# Patient Record
Sex: Male | Born: 1935 | Race: White | Hispanic: No | Marital: Married | Smoking: Never smoker
Health system: Southern US, Community
[De-identification: ages and names within clinical notes are randomized; demographics above are authoritative.]

## PROBLEM LIST (undated history)

## (undated) DIAGNOSIS — N200 Calculus of kidney: Secondary | ICD-10-CM

## (undated) DIAGNOSIS — E785 Hyperlipidemia, unspecified: Secondary | ICD-10-CM

## (undated) DIAGNOSIS — C801 Malignant (primary) neoplasm, unspecified: Secondary | ICD-10-CM

## (undated) DIAGNOSIS — S62102A Fracture of unspecified carpal bone, left wrist, initial encounter for closed fracture: Secondary | ICD-10-CM

## (undated) DIAGNOSIS — H3530 Unspecified macular degeneration: Secondary | ICD-10-CM

## (undated) DIAGNOSIS — N429 Disorder of prostate, unspecified: Secondary | ICD-10-CM

## (undated) DIAGNOSIS — M1990 Unspecified osteoarthritis, unspecified site: Secondary | ICD-10-CM

## (undated) DIAGNOSIS — C61 Malignant neoplasm of prostate: Secondary | ICD-10-CM

## (undated) DIAGNOSIS — I10 Essential (primary) hypertension: Secondary | ICD-10-CM

## (undated) HISTORY — DX: Essential (primary) hypertension: I10

## (undated) HISTORY — DX: Malignant neoplasm of prostate: C61

## (undated) HISTORY — DX: Hyperlipidemia, unspecified: E78.5

## (undated) HISTORY — DX: Unspecified osteoarthritis, unspecified site: M19.90

## (undated) HISTORY — DX: Calculus of kidney: N20.0

## (undated) HISTORY — PX: TOTAL KNEE ARTHROPLASTY: SHX125

---

## 2001-07-16 DIAGNOSIS — C61 Malignant neoplasm of prostate: Secondary | ICD-10-CM

## 2001-07-16 HISTORY — DX: Malignant neoplasm of prostate: C61

## 2006-04-23 ENCOUNTER — Other Ambulatory Visit: Payer: Self-pay

## 2006-04-23 ENCOUNTER — Ambulatory Visit: Payer: Self-pay | Admitting: Urology

## 2006-04-29 ENCOUNTER — Ambulatory Visit: Payer: Self-pay | Admitting: Urology

## 2006-09-18 ENCOUNTER — Ambulatory Visit: Payer: Self-pay | Admitting: Urology

## 2006-12-05 ENCOUNTER — Ambulatory Visit: Payer: Self-pay | Admitting: Gastroenterology

## 2008-03-02 ENCOUNTER — Ambulatory Visit: Payer: Self-pay | Admitting: Internal Medicine

## 2011-02-12 ENCOUNTER — Ambulatory Visit: Payer: Self-pay | Admitting: General Practice

## 2011-02-28 ENCOUNTER — Inpatient Hospital Stay: Payer: Self-pay | Admitting: General Practice

## 2011-03-20 ENCOUNTER — Encounter: Payer: Self-pay | Admitting: General Practice

## 2011-04-16 ENCOUNTER — Encounter: Payer: Self-pay | Admitting: General Practice

## 2011-04-25 ENCOUNTER — Ambulatory Visit: Payer: Self-pay | Admitting: Internal Medicine

## 2012-04-16 DIAGNOSIS — C61 Malignant neoplasm of prostate: Secondary | ICD-10-CM | POA: Insufficient documentation

## 2012-04-16 DIAGNOSIS — N281 Cyst of kidney, acquired: Secondary | ICD-10-CM | POA: Insufficient documentation

## 2012-04-16 DIAGNOSIS — N529 Male erectile dysfunction, unspecified: Secondary | ICD-10-CM | POA: Insufficient documentation

## 2013-04-15 DIAGNOSIS — N320 Bladder-neck obstruction: Secondary | ICD-10-CM | POA: Insufficient documentation

## 2014-03-18 ENCOUNTER — Ambulatory Visit: Payer: Self-pay | Admitting: Internal Medicine

## 2015-01-24 DIAGNOSIS — C4492 Squamous cell carcinoma of skin, unspecified: Secondary | ICD-10-CM | POA: Insufficient documentation

## 2015-07-24 ENCOUNTER — Encounter: Payer: Self-pay | Admitting: Emergency Medicine

## 2015-07-24 ENCOUNTER — Emergency Department
Admission: EM | Admit: 2015-07-24 | Discharge: 2015-07-24 | Disposition: A | Discharge status: Home / Self Care | Payer: BC Managed Care – PPO | Attending: Emergency Medicine | Admitting: Emergency Medicine

## 2015-07-24 ENCOUNTER — Emergency Department: Payer: BC Managed Care – PPO

## 2015-07-24 DIAGNOSIS — S5292XA Unspecified fracture of left forearm, initial encounter for closed fracture: Secondary | ICD-10-CM

## 2015-07-24 DIAGNOSIS — Y990 Civilian activity done for income or pay: Secondary | ICD-10-CM | POA: Diagnosis not present

## 2015-07-24 DIAGNOSIS — S52592A Other fractures of lower end of left radius, initial encounter for closed fracture: Secondary | ICD-10-CM | POA: Diagnosis not present

## 2015-07-24 DIAGNOSIS — Y9289 Other specified places as the place of occurrence of the external cause: Secondary | ICD-10-CM | POA: Diagnosis not present

## 2015-07-24 DIAGNOSIS — S61412A Laceration without foreign body of left hand, initial encounter: Secondary | ICD-10-CM | POA: Diagnosis not present

## 2015-07-24 DIAGNOSIS — Y9389 Activity, other specified: Secondary | ICD-10-CM | POA: Insufficient documentation

## 2015-07-24 DIAGNOSIS — W000XXA Fall on same level due to ice and snow, initial encounter: Secondary | ICD-10-CM | POA: Diagnosis not present

## 2015-07-24 DIAGNOSIS — S6992XA Unspecified injury of left wrist, hand and finger(s), initial encounter: Secondary | ICD-10-CM | POA: Diagnosis present

## 2015-07-24 DIAGNOSIS — S52502A Unspecified fracture of the lower end of left radius, initial encounter for closed fracture: Secondary | ICD-10-CM | POA: Diagnosis not present

## 2015-07-24 MED ORDER — HYDROCODONE-ACETAMINOPHEN 5-325 MG PO TABS
2.0000 | ORAL_TABLET | Freq: Once | ORAL | Status: AC
  Administered 2015-07-24: 2 via ORAL
  Filled 2015-07-24: qty 2

## 2015-07-24 MED ORDER — PROMETHAZINE HCL 25 MG PO TABS: 25.0000 mg | ORAL_TABLET | Freq: Four times a day (QID) | ORAL | Status: DC | PRN

## 2015-07-24 MED ORDER — BACITRACIN ZINC 500 UNIT/GM EX OINT
TOPICAL_OINTMENT | CUTANEOUS | Status: AC
  Administered 2015-07-24: 11:00:00
  Filled 2015-07-24: qty 0.9

## 2015-07-24 MED ORDER — HYDROCODONE-ACETAMINOPHEN 5-325 MG PO TABS: 1.0000 | ORAL_TABLET | ORAL | Status: DC | PRN

## 2015-07-24 NOTE — ED Provider Notes (Signed)
[facility] Emergency Department Provider Note  ____________________________________________  Time seen: Approximately 10:24 AM  I have reviewed the triage vital signs and the nursing notes.   HISTORY  Chief Complaint Wrist Pain   HPI Todd Trujillo. is a 82 y.o. male presents with complaints of left wrist pain status post fall prior to arrival. Also complains of having a skin tear to his left palm.Patient states he was working outside in the snow when he slipped and fell. Complains of pain, but minimal.    History reviewed. No pertinent past medical history.  There are no active problems to display for this patient.   Past Surgical History  Procedure Laterality Date  . Total knee arthroplasty      Current Outpatient Rx  Name  Route  Sig  Dispense  Refill  . HYDROcodone-acetaminophen (NORCO) 5-325 MG tablet   Oral   Take 1-2 tablets by mouth every 4 (four) hours as needed for moderate pain.   20 tablet   0   . promethazine (PHENERGAN) 25 MG tablet   Oral   Take 1 tablet (25 mg total) by mouth every 6 (six) hours as needed for nausea or vomiting.   12 tablet   0     Allergies Codeine  History reviewed. No pertinent family history.  Social History Social History  Substance Use Topics  . Smoking status: Never Smoker   . Smokeless tobacco: None  . Alcohol Use: None    Review of Systems }Constitutional: No fever/chills Eyes: No visual changes. ENT: No sore throat. Cardiovascular: Denies chest pain. Respiratory: Denies shortness of breath. Gastrointestinal: No abdominal pain.  No nausea, no vomiting.  No diarrhea.  No constipation. Genitourinary: Negative for dysuria. Musculoskeletal: Positive for left wrist pain Skin: Negative for rash. Neurological: Negative for headaches, focal weakness or numbness.   10-point ROS otherwise negative.  ____________________________________________   PHYSICAL EXAM:  VITAL  SIGNS: ED Triage Vitals  Enc Vitals Group     BP 07/24/15 1009 155/81 mmHg     Pulse Rate 07/24/15 1009 82     Resp 07/24/15 1009 18     Temp 07/24/15 1009 97.9 F (36.6 C)     Temp Source 07/24/15 1009 Oral     SpO2 07/24/15 1009 95 %     Weight 07/24/15 1009 172 lb (78.019 kg)     Height 07/24/15 1009 5\' 11"  (1.803 m)     Head Cir --      Peak Flow --      Pain Score 07/24/15 1005 4     Pain Loc --      Pain Edu? --      Excl. in [place]? --     Constitutional: Alert and oriented. Well appearing and in no acute distress.   Cardiovascular: Normal rate, regular rhythm. Grossly normal heart sounds.  Good peripheral circulation. Respiratory: Normal respiratory effort.  No retractions. Lungs CTAB. Musculoskeletal: No lower extremity tenderness nor edema.  No joint effusions. Neurovascularly intact left wrist with full range of motion however limited by pain only. No ecchymosis or edema. Neurologic:  Normal speech and language. No gross focal neurologic deficits are appreciated. No gait instability. Skin:  One centimeters nummular skin tear noted to the palmar aspect of his left hand. Psychiatric: Mood and affect are normal. Speech and behavior are normal.  ____________________________________________   LABS (all labs ordered are listed, but only abnormal results are displayed)  Labs Reviewed - No data to display  ____________________________________________  RADIOLOGY  There is a nondisplaced fracture involving the distal radius. No significant angulation.  IMPRESSION: 1. Nondisplaced distal radius fracture. ____________________________________________   PROCEDURES  Procedure(s) performed: None  Critical Care performed: No  ____________________________________________   INITIAL IMPRESSION / ASSESSMENT AND PLAN / ED COURSE  Pertinent labs & imaging results that were available during my care of the patient were reviewed by me and considered in my medical decision making  (see chart for details).  Acute nondisplaced distal radius fracture. Vicodin 5/325 #2 given while in the ED. Palmar skin tear cleaned and Steri-Strips placed. ____________________________________________   FINAL CLINICAL IMPRESSION(S) / ED DIAGNOSES  Final diagnoses:  Radial fracture, left, closed, initial encounter      Arlyss Repress, PA-C 07/24/15 1105  Eula Listen, MD 07/24/15 1502

## 2015-07-24 NOTE — ED Notes (Signed)
Reports slipped on ice, pain to left wrist.  Skin tear to left palm

## 2015-07-24 NOTE — ED Notes (Signed)
NAD noted at time of D/C. Pt refused wheelchair to the lobby at this time. Pt denies comments/concerns at this time.  

## 2015-07-24 NOTE — Discharge Instructions (Signed)
Forearm Fracture A forearm fracture is a break in one or both of the bones of your arm that are between the elbow and the wrist. Your forearm is made up of two bones:  Radius. This is the bone on the inside of your arm near your thumb.  Ulna. This is the bone on the outside of your arm near your little finger. Middle forearm fractures usually break both the radius and the ulna. Most forearm fractures that involve both the ulna and radius will require surgery. CAUSES Common causes of this type of fracture include:  Falling on an outstretched arm.  Accidents, such as a car or bike accident.  A hard, direct hit to the middle part of your arm. RISK FACTORS You may be at higher risk for this type of fracture if:  You play contact sports.  You have a condition that causes your bones to be weak or thin (osteoporosis). SIGNS AND SYMPTOMS A forearm fracture causes pain immediately after the injury. Other signs and symptoms include:  An abnormal bend or bump in your arm (deformity).  Swelling.  Numbness or tingling.  Tenderness.  Inability to turn your hand from side to side (rotate).  Bruising. DIAGNOSIS Your health care provider may diagnose a forearm fracture based on:  Your symptoms.  Your medical history, including any recent injury.  A physical exam. Your health care provider will look for any deformity and feel for tenderness over the break. Your health care provider will also check whether the bones are out of place.  An X-ray exam to confirm the diagnosis and learn more about the type of fracture. TREATMENT The goals of treatment are to get the bone or bones in proper position for healing and to keep the bones from moving so they will heal over time. Your treatment will depend on many factors, especially the type of fracture that you have.  If the fractured bone or bones:  Are in the correct position (nondisplaced), you may only need to wear a cast or a  splint.  Have a slightly displaced fracture, you may need to have the bones moved back into place manually (closed reduction) before the splint or cast is put on.  You may have a temporary splint before you have a cast. The splint allows room for some swelling. After a few days, a cast can replace the splint.  You may have to wear the cast for 6-8 weeks or as directed by your health care provider.  The cast may be changed after about 3 weeks or as directed by your health care provider.  After your cast is removed, you may need physical therapy to regain full movement in your wrist or elbow.  You may need emergency surgery if you have:  A fractured bone or bones that are out of position (displaced).  A fracture with multiple fragments (comminuted fracture).  A fracture that breaks the skin (open fracture). This type of fracture may require surgical wires, plates, or screws to hold the bone or bones in place.  You may have X-rays every couple of weeks to check on your healing. HOME CARE INSTRUCTIONS If You Have a Cast:  Do not stick anything inside the cast to scratch your skin. Doing that increases your risk of infection.  Check the skin around the cast every day. Report any concerns to your health care provider. You may put lotion on dry skin around the edges of the cast. Do not apply lotion to the skin  underneath the cast. If You Have a Splint:  Wear it as directed by your health care provider. Remove it only as directed by your health care provider.  Loosen the splint if your fingers become numb and tingle, or if they turn cold and blue. Bathing  Cover the cast or splint with a watertight plastic bag to protect it from water while you bathe or shower. Do not let the cast or splint get wet. Managing Pain, Stiffness, and Swelling  If directed, apply ice to the injured area:  Put ice in a plastic bag.  Place a towel between your skin and the bag.  Leave the ice on for 20  minutes, 2-3 times a day.  Move your fingers often to avoid stiffness and to lessen swelling.  Raise the injured area above the level of your heart while you are sitting or lying down. Driving  Do not drive or operate heavy machinery while taking pain medicine.  Do not drive while wearing a cast or splint on a hand that you use for driving. Activity  Return to your normal activities as directed by your health care provider. Ask your health care provider what activities are safe for you.  Perform range-of-motion exercises only as directed by your health care provider. Safety  Do not use your injured limb to support your body weight until your health care provider says that you can. General Instructions  Do not put pressure on any part of the cast or splint until it is fully hardened. This may take several hours.  Keep the cast or splint clean and dry.  Do not use any tobacco products, including cigarettes, chewing tobacco, or electronic cigarettes. Tobacco can delay bone healing. If you need help quitting, ask your health care provider.  Take medicines only as directed by your health care provider.  Keep all follow-up visits as directed by your health care provider. This is important. SEEK MEDICAL CARE IF:  Your pain medicine is not helping.  Your cast or splint becomes wet or damaged or suddenly feels too tight.  Your cast becomes loose.  You have more severe pain or swelling than you did before the cast.  You have severe pain when you stretch your fingers.  You continue to have pain or stiffness in your elbow or your wrist after your cast is removed. SEEK IMMEDIATE MEDICAL CARE IF:  You cannot move your fingers.  You lose feeling in your fingers or your hand.  Your hand or your fingers turn cold and pale or blue.  You notice a bad smell coming from your cast.  You have drainage from underneath your cast.  You have new stains from blood or drainage that is coming  through your cast.   This information is not intended to replace advice given to you by your health care provider. Make sure you discuss any questions you have with your health care provider.   Document Released: 06/29/2000 Document Revised: 07/23/2014 Document Reviewed: 02/15/2014 Elsevier Interactive Patient Education 2016 [place] or Splint Care Casts and splints support injured limbs and keep bones from moving while they heal. It is important to care for your cast or splint at home.  HOME CARE INSTRUCTIONS  Keep the cast or splint uncovered during the drying period. It can take 24 to 48 hours to dry if it is made of plaster. A fiberglass cast will dry in less than 1 hour.  Do not rest the cast on anything harder than  a pillow for the first 24 hours.  Do not put weight on your injured limb or apply pressure to the cast until your health care provider gives you permission.  Keep the cast or splint dry. Wet casts or splints can lose their shape and may not support the limb as well. A wet cast that has lost its shape can also create harmful pressure on your skin when it dries. Also, wet skin can become infected.  Cover the cast or splint with a plastic bag when bathing or when out in the rain or snow. If the cast is on the trunk of the body, take sponge baths until the cast is removed.  If your cast does become wet, dry it with a towel or a blow dryer on the cool setting only.  Keep your cast or splint clean. Soiled casts may be wiped with a moistened cloth.  Do not place any hard or soft foreign objects under your cast or splint, such as cotton, toilet paper, lotion, or powder.  Do not try to scratch the skin under the cast with any object. The object could get stuck inside the cast. Also, scratching could lead to an infection. If itching is a problem, use a blow dryer on a cool setting to relieve discomfort.  Do not trim or cut your cast or remove padding from inside of  it.  Exercise all joints next to the injury that are not immobilized by the cast or splint. For example, if you have a long leg cast, exercise the hip joint and toes. If you have an arm cast or splint, exercise the shoulder, elbow, thumb, and fingers.  Elevate your injured arm or leg on 1 or 2 pillows for the first 1 to 3 days to decrease swelling and pain.It is best if you can comfortably elevate your cast so it is higher than your heart. SEEK MEDICAL CARE IF:   Your cast or splint cracks.  Your cast or splint is too tight or too loose.  You have unbearable itching inside the cast.  Your cast becomes wet or develops a soft spot or area.  You have a bad smell coming from inside your cast.  You get an object stuck under your cast.  Your skin around the cast becomes red or raw.  You have new pain or worsening pain after the cast has been applied. SEEK IMMEDIATE MEDICAL CARE IF:   You have fluid leaking through the cast.  You are unable to move your fingers or toes.  You have discolored (blue or white), cool, painful, or very swollen fingers or toes beyond the cast.  You have tingling or numbness around the injured area.  You have severe pain or pressure under the cast.  You have any difficulty with your breathing or have shortness of breath.  You have chest pain.   This information is not intended to replace advice given to you by your health care provider. Make sure you discuss any questions you have with your health care provider.   Document Released: 06/29/2000 Document Revised: 04/22/2013 Document Reviewed: 01/08/2013 Elsevier Interactive Patient Education [employer].

## 2015-07-25 DIAGNOSIS — S52532A Colles' fracture of left radius, initial encounter for closed fracture: Secondary | ICD-10-CM | POA: Diagnosis not present

## 2015-08-03 DIAGNOSIS — S52532D Colles' fracture of left radius, subsequent encounter for closed fracture with routine healing: Secondary | ICD-10-CM | POA: Diagnosis not present

## 2015-08-10 DIAGNOSIS — H353122 Nonexudative age-related macular degeneration, left eye, intermediate dry stage: Secondary | ICD-10-CM | POA: Diagnosis not present

## 2015-08-24 DIAGNOSIS — S52532D Colles' fracture of left radius, subsequent encounter for closed fracture with routine healing: Secondary | ICD-10-CM | POA: Diagnosis not present

## 2015-09-06 DIAGNOSIS — Z862 Personal history of diseases of the blood and blood-forming organs and certain disorders involving the immune mechanism: Secondary | ICD-10-CM | POA: Diagnosis not present

## 2015-09-06 DIAGNOSIS — E7800 Pure hypercholesterolemia, unspecified: Secondary | ICD-10-CM | POA: Diagnosis not present

## 2015-09-06 DIAGNOSIS — Z79899 Other long term (current) drug therapy: Secondary | ICD-10-CM | POA: Diagnosis not present

## 2015-09-06 DIAGNOSIS — Z125 Encounter for screening for malignant neoplasm of prostate: Secondary | ICD-10-CM | POA: Diagnosis not present

## 2015-09-07 DIAGNOSIS — C4442 Squamous cell carcinoma of skin of scalp and neck: Secondary | ICD-10-CM | POA: Diagnosis not present

## 2015-09-07 DIAGNOSIS — Z85828 Personal history of other malignant neoplasm of skin: Secondary | ICD-10-CM | POA: Diagnosis not present

## 2015-09-07 DIAGNOSIS — L570 Actinic keratosis: Secondary | ICD-10-CM | POA: Diagnosis not present

## 2015-09-07 DIAGNOSIS — D485 Neoplasm of uncertain behavior of skin: Secondary | ICD-10-CM | POA: Diagnosis not present

## 2015-09-07 DIAGNOSIS — S52562D Barton's fracture of left radius, subsequent encounter for closed fracture with routine healing: Secondary | ICD-10-CM | POA: Diagnosis not present

## 2015-09-21 DIAGNOSIS — H353211 Exudative age-related macular degeneration, right eye, with active choroidal neovascularization: Secondary | ICD-10-CM | POA: Diagnosis not present

## 2015-09-22 DIAGNOSIS — C4442 Squamous cell carcinoma of skin of scalp and neck: Secondary | ICD-10-CM | POA: Diagnosis not present

## 2015-10-12 DIAGNOSIS — Z862 Personal history of diseases of the blood and blood-forming organs and certain disorders involving the immune mechanism: Secondary | ICD-10-CM | POA: Diagnosis not present

## 2015-10-12 DIAGNOSIS — I739 Peripheral vascular disease, unspecified: Secondary | ICD-10-CM | POA: Insufficient documentation

## 2015-10-12 DIAGNOSIS — N400 Enlarged prostate without lower urinary tract symptoms: Secondary | ICD-10-CM | POA: Diagnosis not present

## 2015-10-12 DIAGNOSIS — I10 Essential (primary) hypertension: Secondary | ICD-10-CM | POA: Diagnosis not present

## 2015-10-12 DIAGNOSIS — Z1211 Encounter for screening for malignant neoplasm of colon: Secondary | ICD-10-CM | POA: Diagnosis not present

## 2015-10-12 DIAGNOSIS — Z79899 Other long term (current) drug therapy: Secondary | ICD-10-CM | POA: Diagnosis not present

## 2015-10-12 DIAGNOSIS — M25532 Pain in left wrist: Secondary | ICD-10-CM | POA: Diagnosis not present

## 2015-10-12 DIAGNOSIS — E7800 Pure hypercholesterolemia, unspecified: Secondary | ICD-10-CM | POA: Diagnosis not present

## 2015-10-12 DIAGNOSIS — I6523 Occlusion and stenosis of bilateral carotid arteries: Secondary | ICD-10-CM | POA: Diagnosis not present

## 2015-10-12 DIAGNOSIS — Z0000 Encounter for general adult medical examination without abnormal findings: Secondary | ICD-10-CM | POA: Diagnosis not present

## 2015-10-17 DIAGNOSIS — J3489 Other specified disorders of nose and nasal sinuses: Secondary | ICD-10-CM | POA: Diagnosis not present

## 2015-10-17 DIAGNOSIS — H6123 Impacted cerumen, bilateral: Secondary | ICD-10-CM | POA: Diagnosis not present

## 2015-10-19 DIAGNOSIS — Z1211 Encounter for screening for malignant neoplasm of colon: Secondary | ICD-10-CM | POA: Diagnosis not present

## 2015-11-02 DIAGNOSIS — I6523 Occlusion and stenosis of bilateral carotid arteries: Secondary | ICD-10-CM | POA: Diagnosis not present

## 2015-11-02 DIAGNOSIS — S52562D Barton's fracture of left radius, subsequent encounter for closed fracture with routine healing: Secondary | ICD-10-CM | POA: Diagnosis not present

## 2015-11-02 DIAGNOSIS — H353211 Exudative age-related macular degeneration, right eye, with active choroidal neovascularization: Secondary | ICD-10-CM | POA: Diagnosis not present

## 2015-11-09 ENCOUNTER — Ambulatory Visit: Payer: BC Managed Care – PPO | Attending: Orthopedic Surgery | Admitting: Occupational Therapy

## 2015-11-09 DIAGNOSIS — M25642 Stiffness of left hand, not elsewhere classified: Secondary | ICD-10-CM | POA: Diagnosis not present

## 2015-11-09 DIAGNOSIS — M25532 Pain in left wrist: Secondary | ICD-10-CM | POA: Insufficient documentation

## 2015-11-09 DIAGNOSIS — M25632 Stiffness of left wrist, not elsewhere classified: Secondary | ICD-10-CM | POA: Insufficient documentation

## 2015-11-09 DIAGNOSIS — M6281 Muscle weakness (generalized): Secondary | ICD-10-CM | POA: Insufficient documentation

## 2015-11-09 NOTE — Therapy (Signed)
[facility] [address] [place], [address] Phone: [phone]   Fax:  [phone]  Occupational Therapy Treatment  Patient Details  Name: Todd Trujillo. MRN: [id number] Date of Birth: [birth year] Referring Provider: Carlynn Spry  Encounter Date: 11/09/2015      OT End of Session - 11/09/15 0958    Visit Number 1   Number of Visits 8   Date for OT Re-Evaluation 12/07/15   OT Start Time 0850   OT Stop Time 0955   OT Time Calculation (min) 65 min   Activity Tolerance Patient tolerated treatment well   Behavior During Therapy [facility] for tasks assessed/performed      No past medical history on file.  Past Surgical History  Procedure Laterality Date  . Total knee arthroplasty      There were no vitals filed for this visit.          [facility] OT Assessment - 11/09/15 0001    Assessment   Diagnosis L radius fx   Referring Provider Carlynn Spry   Onset Date 07/23/15   Precautions   Required Braces or Orthoses Other Brace/Splint   Other Brace/Splint Wrap around wrist at times - recommmend to use during heavy act   Balance Screen   Has the patient fallen in the past 6 months Yes   How many times? 1   Has the patient had a decrease in activity level because of a fear of falling?  No   Is the patient reluctant to leave their home because of a fear of falling?  No   Home  Environment   Lives With Spouse   Prior Function   Vocation Retired   Leisure Work still at [employer] - every day - use to play golf , fish at [employer] , [place] prior to fall - help some with house work    AROM   Right Forearm Pronation 90 Degrees   Right Forearm Supination 90 Degrees   Left Forearm Pronation 75 Degrees   Left Forearm Supination 80 Degrees   Right Wrist Extension 65 Degrees   Right Wrist Flexion 65 Degrees   Right Wrist Radial Deviation 25 Degrees   Right Wrist Ulnar Deviation 30 Degrees    Left Wrist Extension 38 Degrees   Left Wrist Flexion 45 Degrees   Left Wrist Radial Deviation 5 Degrees   Left Wrist Ulnar Deviation 22 Degrees   Strength   Right Hand Grip (lbs) 49   Right Hand Lateral Pinch 21 lbs   Right Hand 3 Point Pinch 15 lbs   Left Hand Grip (lbs) 20   Left Hand Lateral Pinch 13 lbs   Left Hand 3 Point Pinch 10 lbs   Left Hand AROM   L Index  MCP 0-90 80 Degrees   L Index PIP 0-100 706 Degrees   L Long  MCP 0-90 85 Degrees   L Long PIP 0-100 60 Degrees   L Ring  MCP 0-90 80 Degrees   L Ring PIP 0-100 70 Degrees   L Little  MCP 0-90 85 Degrees   L Little PIP 0-100 65 Degrees         Fluido therapy done - with AROM for digits and wrist in all planes - prior to review of HEP - PIP's flexion improved greatly                  OT Education - 11/09/15 [id number]  Education provided Yes   Education Details HEP   Person(s) Educated Patient   Methods Explanation;Demonstration;Tactile cues;Verbal cues   Comprehension Verbal cues required;Returned demonstration;Verbalized understanding          OT Short Term Goals - 11/09/15 1002    OT SHORT TERM GOAL #1   Title Pain on PRWHE improve with 10 points at least    Baseline pain on PRWHE at eval 25/50    Time 3   Period Weeks   Status New   OT SHORT TERM GOAL #2   Title AROM in digits improve to touching palm to grast cylinder objects during ADL's    Baseline MC's 80-85 and PIP 's 60-70's    Time 3   Period Weeks   Status New           OT Long Term Goals - 11/09/15 1003    OT LONG TERM GOAL #1   Title Wrist AROM improve in all planes except UD improve with 10 degrees to turn do hair, bath , use hand with less pain    Baseline see flowsheet    Time 4   Period Weeks   Status New   OT LONG TERM GOAL #2   Title Grip strength improve with 10 lbs to do yard work with less pain    Baseline Grip L 20 and R 49 - Pt is L hand dominant    Time 4   Period Weeks   Status New   OT LONG TERM  GOAL #3   Title Function score on PRWHE improve with at least 10 points    Baseline PRWHE at eval 15/50   Time 4   Period Weeks   Status New               Plan - 11/09/15 0959    Clinical Impression Statement Pt present more than 3 months post fall in 07/23/15 - pt had cast for about 2 wks nad then brace until about month per pt - last xray at [place] office was good - pt cont to have pain with wrist motion , with use , and  anything that has some wieght too it - pt  show decrease wrist AROM in all planes - as well as digits flexion PIP's more than MC's - decrease grip and prehension - pt report  pain and hard time picking up anything with some weight, cutting food, bathing , driving - cannot play golf     Rehab Potential Good   OT Frequency 2x / week   OT Duration 4 weeks   OT Treatment/Interventions Self-care/ADL training;Fluidtherapy;Parrafin;Manual Therapy;Therapeutic exercises;Patient/family education;Splinting   Plan Assess progress    OT Home Exercise Plan see pt instruction    Consulted and Agree with Plan of Care Patient      Patient will benefit from skilled therapeutic intervention in order to improve the following deficits and impairments:  Decreased range of motion, Impaired flexibility, Impaired UE functional use, Pain, Decreased strength  Visit Diagnosis: Pain in left wrist - Plan: Ot plan of care cert/re-cert  Stiffness of left hand, not elsewhere classified - Plan: Ot plan of care cert/re-cert  Stiffness of left wrist, not elsewhere classified - Plan: Ot plan of care cert/re-cert  Muscle weakness (generalized) - Plan: Ot plan of care cert/re-cert      G-Codes - [id number]    Functional Assessment Tool Used ROM , grip strength, PRWHE , pain , clinical judgement    Functional Limitation  Self care   Self Care Current Status [phone]) At least 40 percent but less than 60 percent impaired, limited or restricted   Self Care Goal Status [id number]) At least 1  percent but less than 20 percent impaired, limited or restricted      Problem List There are no active problems to display for this patient.   Rosalyn Gess OTR/L,CLT  11/09/2015, 10:08 AM  [facility] [address], [place], [address] Phone: [phone]   Fax:  [phone]  Name: Todd Trujillo. MRN: [id number] Date of Birth: 08-31-1929

## 2015-11-09 NOTE — Patient Instructions (Signed)
Heat  PROM for wrist RD, wrist flexion and ext AROM in all planes for wrist   Tendon glides AROM  8-10 reps all  Hold 3 sec  2 x day

## 2015-11-16 ENCOUNTER — Ambulatory Visit: Payer: BC Managed Care – PPO | Attending: Orthopedic Surgery | Admitting: Occupational Therapy

## 2015-11-16 DIAGNOSIS — M25642 Stiffness of left hand, not elsewhere classified: Secondary | ICD-10-CM | POA: Diagnosis not present

## 2015-11-16 DIAGNOSIS — M25532 Pain in left wrist: Secondary | ICD-10-CM | POA: Insufficient documentation

## 2015-11-16 DIAGNOSIS — M25632 Stiffness of left wrist, not elsewhere classified: Secondary | ICD-10-CM | POA: Insufficient documentation

## 2015-11-16 DIAGNOSIS — M6281 Muscle weakness (generalized): Secondary | ICD-10-CM | POA: Diagnosis not present

## 2015-11-16 NOTE — Patient Instructions (Signed)
Teal putty for gripping , lat and 3 point grip as well as twisting FW and BW  10 reps each and add to HEP  Stop grip prior to increase pain    1lbs weight for wrist ext/ flexion   1lbs for RD and UD  And sup/pro - not increase pain but needed Mod v/c and min A to do correctly  Add 1 lbs for HEP   10 reps  2 x day

## 2015-11-16 NOTE — Therapy (Signed)
[place] [facility] [address] [place], [address] Phone: [phone]   Fax:  [phone]  Occupational Therapy Treatment  Patient Details  Name: Todd Trujillo. MRN: [id number] Date of Birth: [birth year] Referring Provider: Carlynn Spry  Encounter Date: 11/16/2015      OT End of Session - 11/16/15 1327    Visit Number 2   Number of Visits 8   Date for OT Re-Evaluation 12/07/15   OT Start Time 0848   OT Stop Time 0934   OT Time Calculation (min) 46 min   Activity Tolerance Patient tolerated treatment well   Behavior During Therapy [facility] for tasks assessed/performed      No past medical history on file.  Past Surgical History  Procedure Laterality Date  . Total knee arthroplasty      There were no vitals filed for this visit.      Subjective Assessment - 11/16/15 0855    Subjective  Doing okay - look at my fist - I can touch my palm - heat feels good and I get more flexibility but if its cold - it is stiff- wrist still hurting    Patient Stated Goals Want to get the pain better and using it like before - I am Left handed   Currently in Pain? Yes   Pain Score 3    Pain Location Wrist   Pain Orientation Left   Pain Descriptors / Indicators Aching;Shooting            [facility] OT Assessment - 11/16/15 0001    Left Hand AROM   L Index  MCP 0-90 80 Degrees   L Index PIP 0-100 90 Degrees   L Long  MCP 0-90 90 Degrees   L Long PIP 0-100 80 Degrees   L Ring  MCP 0-90 90 Degrees   L Ring PIP 0-100 90 Degrees   L Little  MCP 0-90 90 Degrees   L Little PIP 0-100 90 Degrees                  OT Treatments/Exercises (OP) - 11/16/15 0001    LUE Fluidotherapy   Number Minutes Fluidotherapy 10 Minutes   LUE Fluidotherapy Location Wrist;Hand   Comments At [facility] to increase ROM at wirist nad decrease pain -  doing AROM to wrist and digits in all planes       Measurements taken for ROM at  digits and wrist - great progress  See flowsheet    Fluido done'   AROM tendon glides - blocked   full fist - pt had pop at wrist every time with tight fist - but no pain with it  Teal putty for gripping , lat and 3 point grip as well as twisting FW and BW  10 reps each and add to HEP  Stop grip prior to increase pain   Reviewed with pt HEP for wrist ext, Flexion and RD over edge of table  10 reps  Pt had pain with transitioning from flexion to extention but not when only repeating one direction  CPM on BTE wrist extention 180 sec  , 1lbs weight for wrist ext  1lbs for RD and UD  And sup/pro - not increase pain but needed Mod v/c and min A to do correctly  Add 1 lbs for HEP                 OT Education - 11/16/15 1327  Education provided Yes   Education Details HEP   Person(s) Educated Patient   Methods Explanation;Demonstration;Tactile cues;Verbal cues;Handout   Comprehension Verbalized understanding;Returned demonstration          OT Short Term Goals - 11/09/15 1002    OT SHORT TERM GOAL #1   Title Pain on PRWHE improve with 10 points at least    Baseline pain on PRWHE at eval 25/50    Time 3   Period Weeks   Status New   OT SHORT TERM GOAL #2   Title AROM in digits improve to touching palm to grast cylinder objects during ADL's    Baseline MC's 80-85 and PIP 's 60-70's    Time 3   Period Weeks   Status New           OT Long Term Goals - 11/09/15 1003    OT LONG TERM GOAL #1   Title Wrist AROM improve in all planes except UD improve with 10 degrees to turn do hair, bath , use hand with less pain    Baseline see flowsheet    Time 4   Period Weeks   Status New   OT LONG TERM GOAL #2   Title Grip strength improve with 10 lbs to do yard work with less pain    Baseline Grip L 20 and R 49 - Pt is L hand dominant    Time 4   Period Weeks   Status New   OT LONG TERM GOAL #3   Title Function score on PRWHE improve with at least 10 points     Baseline PRWHE at eval 15/50   Time 4   Period Weeks   Status New               Plan - 11/16/15 [id number]    Clinical Impression Statement Pt showed some great progress in digits AROM i nflexion , as well as wrist AROM but pt had this date pop at wrist with tight grip - and report pain when transitioning from flexion <> extention - pt to wear neoprene Benik when  doing act that pain is more than 3-4/10    Rehab Potential Good   OT Frequency 2x / week   OT Duration 4 weeks   OT Treatment/Interventions Self-care/ADL training;Fluidtherapy;Parrafin;Manual Therapy;Therapeutic exercises;Patient/family education;Splinting   OT Home Exercise Plan see pt instruction    Consulted and Agree with Plan of Care Patient      Patient will benefit from skilled therapeutic intervention in order to improve the following deficits and impairments:  Decreased range of motion, Impaired flexibility, Impaired UE functional use, Pain, Decreased strength  Visit Diagnosis: Pain in left wrist  Stiffness of left hand, not elsewhere classified  Stiffness of left wrist, not elsewhere classified  Muscle weakness (generalized)    Problem List There are no active problems to display for this patient.   Rosalyn Gess OTR/L,CLT  11/16/2015, 1:34 PM  [place] [facility] [address] [place], [address] Phone: [phone]   Fax:  [phone]  Name: Todd Trujillo. MRN: [id number] Date of Birth: 10/06/1929

## 2015-11-23 ENCOUNTER — Ambulatory Visit: Payer: BC Managed Care – PPO | Admitting: Occupational Therapy

## 2015-11-23 DIAGNOSIS — M25642 Stiffness of left hand, not elsewhere classified: Secondary | ICD-10-CM | POA: Diagnosis not present

## 2015-11-23 DIAGNOSIS — M25532 Pain in left wrist: Secondary | ICD-10-CM

## 2015-11-23 DIAGNOSIS — M6281 Muscle weakness (generalized): Secondary | ICD-10-CM

## 2015-11-23 DIAGNOSIS — M25632 Stiffness of left wrist, not elsewhere classified: Secondary | ICD-10-CM | POA: Diagnosis not present

## 2015-11-23 NOTE — Patient Instructions (Signed)
  Pt HEP increase to 2 lbs for wrist and green putty - but keep pain down less than 2/10

## 2015-11-23 NOTE — Therapy (Signed)
[place] [facility] [address], [place], [address] Phone: [phone]   Fax:  [phone]  Occupational Therapy Treatment  Patient Details  Name: Todd Trujillo. MRN: [id number] Date of Birth: 05-Aug-1929 Referring Provider: Carlynn Spry  Encounter Date: 11/23/2015      OT End of Session - 11/23/15 1006    Visit Number 3   Number of Visits 8   Date for OT Re-Evaluation 12/07/15   OT Start Time 0846   OT Stop Time 0940   OT Time Calculation (min) 54 min   Activity Tolerance Patient tolerated treatment well   Behavior During Therapy [facility] for tasks assessed/performed      No past medical history on file.  Past Surgical History  Procedure Laterality Date  . Total knee arthroplasty      There were no vitals filed for this visit.      Subjective Assessment - 11/23/15 0959    Subjective  I had  headache last night and  I never get headaches - so was worried that I am having heart attack - but liittle better- can you take my BP - I do not have BP issues - wrist nad hand doing better -using it more    Patient Stated Goals Want to get the pain better and using it like before - I am Left handed   Currently in Pain? No/denies            [facility] OT Assessment - 11/23/15 0001    AROM   Left Forearm Pronation 80 Degrees   Left Forearm Supination 80 Degrees   Left Wrist Extension 58 Degrees   Left Wrist Flexion 50 Degrees   Left Wrist Radial Deviation 8 Degrees   Left Wrist Ulnar Deviation 25 Degrees   Strength   Left Hand Grip (lbs) 25                  OT Treatments/Exercises (OP) - 11/23/15 0001    LUE Fluidotherapy   Number Minutes Fluidotherapy 10 Minutes   LUE Fluidotherapy Location Wrist;Hand   Comments At [facility] after measurements to decrease pain and increase ROM       Measurements taken for ROM at  wrist - great progress in extention  And grip L increase 5 lbs  See  flowsheet   Fluido done' full fist - and upgrade to green putty - to do grip , pulling and twisting  10 reps   no increase pain - still some popping at wrist with tight grip - but not as bad as last week Pt need mod cueing to keep thumb out and make sure do flexion with all digits- 2nd mostly - want to keep mostly out 10 reps   CPM on BTE wrist flexion this date 200 sec  Wrist flexion on BTE 12 lbs 120 sec  PROM RD 10 reps  BTE for 1 lbs RD - large knob - need assistance to keep elbow in  PRonation and supination on BTE at 1 lbs - 120 sec each  Again needed assistance to keep elbow to side during pronation - wrist mostly in UD - ? How healing - RD limited PROM and AROM            OT Education - 11/23/15 1006    Education provided Yes   Education Details HEP   Person(s) Educated Patient   Methods Explanation;Demonstration;Tactile cues;Verbal cues   Comprehension Verbal cues required;Returned demonstration;Verbalized  understanding          OT Short Term Goals - 11/23/15 1008    OT SHORT TERM GOAL #1   Title Pain on PRWHE improve with 10 points at least    Baseline pain on PRWHE at eval 25/50 - pain less per pt    Time 2   Period Weeks   Status On-going   OT SHORT TERM GOAL #2   Title AROM in digits improve to touching palm to grast cylinder objects during ADL's    Baseline touching palm - with tight fist - popping at wrist but no pain - 3rd digit still not touching at [facility]   Time 2   Period Weeks   Status On-going           OT Long Term Goals - 11/23/15 1009    OT LONG TERM GOAL #1   Title Wrist AROM improve in all planes except UD improve with 10 degrees to turn do hair, bath , use hand with less pain    Baseline see flowsheet - progressing    Time 3   Period Weeks   Status On-going   OT LONG TERM GOAL #2   Title Grip strength improve with 10 lbs to do yard work with less pain    Baseline grip improve from 20 to 25 - R hand 49 lbs    Time 3    Period Weeks   Status On-going   OT LONG TERM GOAL #3   Title Function score on PRWHE improve with at least 10 points    Baseline PRWHE at eval 15/50 - report increase use   Time 3   Period Weeks   Status On-going               Plan - 11/23/15 1007    Clinical Impression Statement Pt cont to make weekly progress at wrist , digits ROM and grip - pt HEP increase in putty and to 2 lbs - to keep pain still down - pt still have pop at wrist with tight grip - but not as loud and no pain full - pt also limitied in RD - could be how fracture heal    Rehab Potential Good   OT Frequency 1x / week   OT Duration 4 weeks   OT Treatment/Interventions Self-care/ADL training;Fluidtherapy;Parrafin;Manual Therapy;Therapeutic exercises;Patient/family education;Splinting   Plan assess progress and update HEP    OT Home Exercise Plan see pt instruction    Consulted and Agree with Plan of Care Patient      Patient will benefit from skilled therapeutic intervention in order to improve the following deficits and impairments:  Decreased range of motion, Impaired flexibility, Impaired UE functional use, Pain, Decreased strength  Visit Diagnosis: Pain in left wrist  Stiffness of left hand, not elsewhere classified  Stiffness of left wrist, not elsewhere classified  Muscle weakness (generalized)    Problem List There are no active problems to display for this patient.   Rosalyn Gess OTR/L,CLT  11/23/2015, 10:10 AM  [place] [facility] [address], [place], [address] Phone: [phone]   Fax:  [phone]  Name: Todd Trujillo. MRN: [id number] Date of Birth: 12/29/1929

## 2015-11-30 ENCOUNTER — Ambulatory Visit: Payer: BC Managed Care – PPO | Admitting: Occupational Therapy

## 2015-11-30 DIAGNOSIS — M25532 Pain in left wrist: Secondary | ICD-10-CM | POA: Diagnosis not present

## 2015-11-30 DIAGNOSIS — M25632 Stiffness of left wrist, not elsewhere classified: Secondary | ICD-10-CM

## 2015-11-30 DIAGNOSIS — M6281 Muscle weakness (generalized): Secondary | ICD-10-CM | POA: Diagnosis not present

## 2015-11-30 DIAGNOSIS — S52562D Barton's fracture of left radius, subsequent encounter for closed fracture with routine healing: Secondary | ICD-10-CM | POA: Diagnosis not present

## 2015-11-30 DIAGNOSIS — M25642 Stiffness of left hand, not elsewhere classified: Secondary | ICD-10-CM

## 2015-11-30 NOTE — Patient Instructions (Addendum)
   Pt to cont with same HEP for 2-3 wks until pain less than 4/10 at the worse and  ROM at wrist little better

## 2015-11-30 NOTE — Therapy (Signed)
[place] [facility] [address] [place], [address] Phone: [phone]   Fax:  [phone]  Occupational Therapy Treatment and discharge  Patient Details  Name: Todd Trujillo. MRN: [id number] Date of Birth: 05-13-1930 Referring Provider: Carlynn Spry  Encounter Date: 11/30/2015      OT End of Session - 11/30/15 1003    Visit Number 4   Number of Visits 4   Date for OT Re-Evaluation 11/30/15   OT Start Time 0935   OT Stop Time 1017   OT Time Calculation (min) 42 min   Activity Tolerance Patient tolerated treatment well   Behavior During Therapy [facility] for tasks assessed/performed      No past medical history on file.  Past Surgical History  Procedure Laterality Date  . Total knee arthroplasty      There were no vitals filed for this visit.      Subjective Assessment - 11/30/15 1000    Subjective  I am doing okay - not really pain at rest - only with exercise or when  uisng it a lot or something heavy - but not more than 4/10 - I am okay too to be discharge - going to do yard work when I leave here   Patient Stated Goals Want to get the pain better and using it like before - I am Left handed   Currently in Pain? Yes   Pain Score 4    Pain Location Wrist   Pain Orientation Left   Pain Descriptors / Indicators Aching            [facility] OT Assessment - 11/30/15 0001    AROM   Left Forearm Pronation 85 Degrees   Left Forearm Supination 85 Degrees   Right Wrist Extension 65 Degrees   Right Wrist Flexion 65 Degrees   Right Wrist Radial Deviation 25 Degrees   Right Wrist Ulnar Deviation 30 Degrees   Left Wrist Extension 62 Degrees   Left Wrist Flexion 50 Degrees   Left Wrist Radial Deviation 12 Degrees   Left Wrist Ulnar Deviation 25 Degrees   Strength   Right Hand Grip (lbs) 49   Right Hand Lateral Pinch 21 lbs   Right Hand 3 Point Pinch 15 lbs   Left Hand Grip (lbs) 29   Left Hand  Lateral Pinch 16 lbs   Left Hand 3 Point Pinch 10 lbs                  OT Treatments/Exercises (OP) - 11/30/15 0001    LUE Paraffin   Number Minutes Paraffin 10 Minutes   LUE Paraffin Location Hand;Wrist   Comments At [facility] prior to review of HEP for wrist nad manual for hand to increase PIP and MC flexion     Measurements taken for ROM at wrist and grip /prehension  Still some tightness over 3rd digit to make tight fist - but able to touch palm  After parafin and manual therapy - with less clicking at wrist   Paraffin for 10 min to had prior to manual to increase ROM  Graston tools to palm nr 4 and 2 over palm and volar 3rd digit as well as volar wrist - tight and changing pink over volar wrist - but less clicking with full fist and able to touch palm   Tendon glides  And place and hold full fist AROM  OT Education - 11/30/15 1003    Education provided Yes   Education Details HEP   Person(s) Educated Patient   Methods Demonstration;Tactile cues;Verbal cues;Explanation   Comprehension Verbalized understanding;Returned demonstration;Verbal cues required          OT Short Term Goals - 11/30/15 1005    OT SHORT TERM GOAL #1   Title Pain on PRWHE improve with 10 points at least    Baseline Pain 25/50 and now 11/50   Status Achieved   OT SHORT TERM GOAL #2   Title AROM in digits improve to touching palm to grast cylinder objects during ADL's    Baseline touching but 3rd still tighte   Status Partially Met           OT Long Term Goals - 11/30/15 1006    OT LONG TERM GOAL #1   Title Wrist AROM improve in all planes except UD improve with 10 degrees to turn do hair, bath , use hand with less pain    Status Achieved   OT LONG TERM GOAL #2   Title Grip strength improve with 10 lbs to do yard work with less pain    Status Achieved   OT LONG TERM GOAL #3   Title Function score on PRWHE improve with at least 10 points    Baseline PRWHE at  eval 15/50 and now 3/50   Status Achieved               Plan - 11/30/15 1004    Clinical Impression Statement Pt made great gains in wrist AROM , grip and prehension strenght as well as pain and functional use - met all goals - and can cont at home with HEP to improve it some more    OT Treatment/Interventions Self-care/ADL training;Fluidtherapy;Parrafin;Manual Therapy;Therapeutic exercises;Patient/family education;Splinting   Plan Pt discharge with HEP    OT Home Exercise Plan see pt instruction    Consulted and Agree with Plan of Care Patient      Patient will benefit from skilled therapeutic intervention in order to improve the following deficits and impairments:     Visit Diagnosis: Pain in left wrist  Stiffness of left hand, not elsewhere classified  Stiffness of left wrist, not elsewhere classified  Muscle weakness (generalized)    Problem List There are no active problems to display for this patient.   Rosalyn Gess OTR/L,CLT  11/30/2015, 2:33 PM  [place] [facility] [address] [place], [address] Phone: [phone]   Fax:  [phone]  Name: Aldean Pipe. MRN: [id number] Date of Birth: [birth year]

## 2015-12-28 DIAGNOSIS — H353212 Exudative age-related macular degeneration, right eye, with inactive choroidal neovascularization: Secondary | ICD-10-CM | POA: Diagnosis not present

## 2016-01-08 ENCOUNTER — Emergency Department: Payer: BC Managed Care – PPO

## 2016-01-08 ENCOUNTER — Encounter: Payer: Self-pay | Admitting: Emergency Medicine

## 2016-01-08 ENCOUNTER — Other Ambulatory Visit: Payer: Self-pay

## 2016-01-08 ENCOUNTER — Observation Stay
Admission: EM | Admit: 2016-01-08 | Discharge: 2016-01-09 | Disposition: A | Discharge status: Home / Self Care | Payer: BC Managed Care – PPO | Attending: Internal Medicine | Admitting: Internal Medicine

## 2016-01-08 DIAGNOSIS — R51 Headache: Secondary | ICD-10-CM | POA: Diagnosis not present

## 2016-01-08 DIAGNOSIS — R410 Disorientation, unspecified: Principal | ICD-10-CM | POA: Insufficient documentation

## 2016-01-08 DIAGNOSIS — Z79899 Other long term (current) drug therapy: Secondary | ICD-10-CM | POA: Diagnosis not present

## 2016-01-08 DIAGNOSIS — I10 Essential (primary) hypertension: Secondary | ICD-10-CM | POA: Insufficient documentation

## 2016-01-08 DIAGNOSIS — G459 Transient cerebral ischemic attack, unspecified: Secondary | ICD-10-CM | POA: Diagnosis not present

## 2016-01-08 DIAGNOSIS — H3530 Unspecified macular degeneration: Secondary | ICD-10-CM

## 2016-01-08 DIAGNOSIS — Z791 Long term (current) use of non-steroidal anti-inflammatories (NSAID): Secondary | ICD-10-CM | POA: Insufficient documentation

## 2016-01-08 DIAGNOSIS — Z800 Family history of malignant neoplasm of digestive organs: Secondary | ICD-10-CM | POA: Insufficient documentation

## 2016-01-08 DIAGNOSIS — Z8546 Personal history of malignant neoplasm of prostate: Secondary | ICD-10-CM | POA: Insufficient documentation

## 2016-01-08 DIAGNOSIS — Z96659 Presence of unspecified artificial knee joint: Secondary | ICD-10-CM | POA: Insufficient documentation

## 2016-01-08 DIAGNOSIS — R519 Headache, unspecified: Secondary | ICD-10-CM

## 2016-01-08 DIAGNOSIS — Z885 Allergy status to narcotic agent status: Secondary | ICD-10-CM | POA: Insufficient documentation

## 2016-01-08 DIAGNOSIS — Z8042 Family history of malignant neoplasm of prostate: Secondary | ICD-10-CM | POA: Insufficient documentation

## 2016-01-08 HISTORY — DX: Unspecified macular degeneration: H35.30

## 2016-01-08 HISTORY — DX: Disorder of prostate, unspecified: N42.9

## 2016-01-08 HISTORY — DX: Fracture of unspecified carpal bone, left wrist, initial encounter for closed fracture: S62.102A

## 2016-01-08 HISTORY — DX: Malignant (primary) neoplasm, unspecified: C80.1

## 2016-01-08 LAB — URINALYSIS COMPLETE WITH MICROSCOPIC (ARMC ONLY)
BACTERIA UA: NONE SEEN
BILIRUBIN URINE: NEGATIVE
GLUCOSE, UA: NEGATIVE mg/dL
HGB URINE DIPSTICK: NEGATIVE
Ketones, ur: NEGATIVE mg/dL
Leukocytes, UA: NEGATIVE
Nitrite: NEGATIVE
Protein, ur: NEGATIVE mg/dL
RBC / HPF: NONE SEEN RBC/hpf (ref 0–5)
SQUAMOUS EPITHELIAL / LPF: NONE SEEN
Specific Gravity, Urine: 1.021 (ref 1.005–1.030)
WBC UA: NONE SEEN WBC/hpf (ref 0–5)
pH: 6 (ref 5.0–8.0)

## 2016-01-08 LAB — DIFFERENTIAL
BASOS ABS: 0.1 10*3/uL (ref 0–0.1)
Basophils Relative: 1 %
EOS ABS: 0.3 10*3/uL (ref 0–0.7)
Eosinophils Relative: 5 %
LYMPHS ABS: 2.6 10*3/uL (ref 1.0–3.6)
LYMPHS PCT: 33 %
MONOS PCT: 9 %
Monocytes Absolute: 0.7 10*3/uL (ref 0.2–1.0)
NEUTROS ABS: 4 10*3/uL (ref 1.4–6.5)
Neutrophils Relative %: 52 %

## 2016-01-08 LAB — PROTIME-INR
INR: 1.34
Prothrombin Time: 16.7 seconds — ABNORMAL HIGH (ref 11.4–15.0)

## 2016-01-08 LAB — COMPREHENSIVE METABOLIC PANEL
ALBUMIN: 4.3 g/dL (ref 3.5–5.0)
ALK PHOS: 75 U/L (ref 38–126)
ALT: 13 U/L — AB (ref 17–63)
AST: 23 U/L (ref 15–41)
Anion gap: 8 (ref 5–15)
BILIRUBIN TOTAL: 0.6 mg/dL (ref 0.3–1.2)
BUN: 18 mg/dL (ref 6–20)
CALCIUM: 9.2 mg/dL (ref 8.9–10.3)
CO2: 25 mmol/L (ref 22–32)
CREATININE: 1.13 mg/dL (ref 0.61–1.24)
Chloride: 105 mmol/L (ref 101–111)
GFR calc Af Amer: >60 mL/min (ref >60)
GFR, EST NON AFRICAN AMERICAN: 58 mL/min — AB (ref >60)
GLUCOSE: 156 mg/dL — AB (ref 65–99)
Potassium: 3.7 mmol/L (ref 3.5–5.1)
Sodium: 138 mmol/L (ref 135–145)
TOTAL PROTEIN: 6.9 g/dL (ref 6.5–8.1)

## 2016-01-08 LAB — TROPONIN I

## 2016-01-08 LAB — CBC
HEMATOCRIT: 40 % (ref 40.0–52.0)
HEMOGLOBIN: 13.6 g/dL (ref 13.0–18.0)
MCH: 31.8 pg (ref 26.0–34.0)
MCHC: 33.9 g/dL (ref 32.0–36.0)
MCV: 93.8 fL (ref 80.0–100.0)
Platelets: 184 10*3/uL (ref 150–440)
RBC: 4.27 MIL/uL — AB (ref 4.40–5.90)
RDW: 14 % (ref 11.5–14.5)
WBC: 7.7 10*3/uL (ref 3.8–10.6)

## 2016-01-08 LAB — GLUCOSE, CAPILLARY: Glucose-Capillary: 136 mg/dL — ABNORMAL HIGH (ref 65–99)

## 2016-01-08 LAB — APTT: APTT: 29 s (ref 24–36)

## 2016-01-08 MED ORDER — SODIUM CHLORIDE 0.9 % IV BOLUS (SEPSIS)
500.0000 mL | Freq: Once | INTRAVENOUS | Status: AC
  Administered 2016-01-08: 500 mL via INTRAVENOUS

## 2016-01-08 MED ORDER — METOPROLOL TARTRATE 25 MG PO TABS
25.0000 mg | ORAL_TABLET | Freq: Two times a day (BID) | ORAL | Status: DC
  Administered 2016-01-09: 25 mg via ORAL
  Filled 2016-01-08 (×2): qty 1

## 2016-01-08 MED ORDER — ASPIRIN 81 MG PO CHEW
324.0000 mg | CHEWABLE_TABLET | Freq: Once | ORAL | Status: DC
  Filled 2016-01-08: qty 4

## 2016-01-08 MED ORDER — IOPAMIDOL (ISOVUE-370) INJECTION 76%
75.0000 mL | Freq: Once | INTRAVENOUS | Status: AC | PRN
  Administered 2016-01-08: 75 mL via INTRAVENOUS

## 2016-01-08 NOTE — ED Notes (Signed)
Patient is currently in CT scan.

## 2016-01-08 NOTE — ED Notes (Signed)
Patient's daughter states that when patient c/o a headache, she gave the patient 800mg  Ibuprofen po at 1815 tonight. Patient's daughter states the first blood pressure reading was 182/98 at approximately 1820. Daughter took the patient took the fire department and they got a blood pressure reading 160/88.

## 2016-01-08 NOTE — ED Notes (Signed)
Pt to ct scan.

## 2016-01-08 NOTE — ED Notes (Signed)
Patient is back from CT scan. Warm blanket given. Wife at bedside. Patient is alert and oriented x4.

## 2016-01-08 NOTE — H&P (Signed)
PCP:   Idelle Crouch, MD   Chief Complaint:  Confusion  HPI: This 82 year old male returned from the beach today. He went to the grocery store and became quite confused. He could not figure out how to pay for his grocery with his visa card, eventually the clerk assisted him. He returned home and could not figure out how to get into the garage. He eventually did. His wife notes he was confused and checked his blood pressure which was elevated. They took him to the fire station where his blood pressure was rechecked and it was 180/90. He reports no localized weakness, no slurred speech. He states he did have a headache today which is already improved. He was taken to the ER.  Review of Systems:  The patient denies anorexia, fever, weight loss, vision loss, decreased hearing, confusion, hoarseness, chest pain, syncope, dyspnea on exertion, peripheral edema, balance deficits, hemoptysis, abdominal pain, melena, hematochezia, severe indigestion/heartburn, hematuria, incontinence, genital sores, muscle weakness, suspicious skin lesions, transient blindness, difficulty walking, depression, unusual weight change, abnormal bleeding, enlarged lymph nodes, angioedema, and breast masses.  Past Medical History: Past Medical History  Diagnosis Date  . Prostate troubles     Patient was unsure of term  . Macular degeneration of right eye   . Left wrist fracture   . Cancer [facility])     prostate   Past Surgical History  Procedure Laterality Date  . Total knee arthroplasty      Medications: Prior to Admission medications   Medication Sig Start Date End Date Taking? Authorizing Provider  ibuprofen (GOODSENSE IBUPROFEN) 200 MG tablet Take 800 mg by mouth every 6 (six) hours as needed. For pain.   Yes Historical Provider, MD  tamsulosin (FLOMAX) 0.4 MG CAPS capsule Take 0.4 mg by mouth daily. 10/12/15  Yes Historical Provider, MD  HYDROcodone-acetaminophen (NORCO) 5-325 MG tablet Take 1-2 tablets by mouth  every 4 (four) hours as needed for moderate pain. 07/24/15   Arlyss Repress, PA-C  promethazine (PHENERGAN) 25 MG tablet Take 1 tablet (25 mg total) by mouth every 6 (six) hours as needed for nausea or vomiting. 07/24/15   Arlyss Repress, PA-C    Allergies:   Allergies  Allergen Reactions  . Codeine Nausea And Vomiting    Social History:  reports that he has never smoked. He does not have any smokeless tobacco history on file. He reports that he does not drink alcohol. His drug history is not on file.  Family History: Dementia, esophageal cancer  Physical Exam: Filed Vitals:   01/08/16 2120 01/08/16 2137 01/08/16 2200 01/08/16 2230  BP:  174/90 170/88 165/93  Pulse:  78 89 86  Temp: 98.1 F (36.7 C)     TempSrc:      Resp:  18 21 20   Height:      Weight:      SpO2:  97% 98% 94%    General:  Alert and oriented times three, well developed and nourished, no acute distress Eyes: PERRLA, pink conjunctiva, no scleral icterus ENT: Moist oral mucosa, neck supple, no thyromegaly Lungs: clear to ascultation, no wheeze, no crackles, no use of accessory muscles Cardiovascular: regular rate and rhythm, no regurgitation, no gallops, no murmurs. No carotid bruits, no JVD Abdomen: soft, positive BS, non-tender, non-distended, no organomegaly, not an acute abdomen GU: not examined Neuro: CN II - XII grossly intact, sensation intact Musculoskeletal: strength 5/5 all extremities, no clubbing, cyanosis or edema Skin: no rash, no subcutaneous crepitation, no decubitus  Psych: appropriate patient   Labs on Admission:   Recent Labs  01/08/16 1926  NA 138  K 3.7  CL 105  CO2 25  GLUCOSE 156*  BUN 18  CREATININE 1.13  CALCIUM 9.2    Recent Labs  01/08/16 1926  AST 23  ALT 13*  ALKPHOS 75  BILITOT 0.6  PROT 6.9  ALBUMIN 4.3   No results for input(s): LIPASE, AMYLASE in the last 72 hours.  Recent Labs  01/08/16 1926  WBC 7.7  NEUTROABS 4.0  HGB 13.6  HCT 40.0  MCV 93.8   PLT 184    Recent Labs  01/08/16 1926  TROPONINI <0.03   Invalid input(s): POCBNP No results for input(s): DDIMER in the last 72 hours. No results for input(s): HGBA1C in the last 72 hours. No results for input(s): CHOL, HDL, LDLCALC, TRIG, CHOLHDL, LDLDIRECT in the last 72 hours. No results for input(s): TSH, T4TOTAL, T3FREE, THYROIDAB in the last 72 hours.  Invalid input(s): FREET3 No results for input(s): VITAMINB12, FOLATE, FERRITIN, TIBC, IRON, RETICCTPCT in the last 72 hours.  Micro Results: No results found for this or any previous visit (from the past 240 hour(s)).   Radiological Exams on Admission: Ct Angio Head W/cm &/or Wo Cm  01/08/2016  CLINICAL DATA:  Headache and hypertension beginning at 1400 hours today. Confusion. Word-finding difficulties, assess for stroke. History of cancer. EXAM: CT ANGIOGRAPHY HEAD TECHNIQUE: Multidetector CT imaging of the head was performed using the standard protocol during bolus administration of intravenous contrast. Multiplanar CT image reconstructions and MIPs were obtained to evaluate the vascular anatomy. CONTRAST:  75 cc Isovue 370 COMPARISON:  CT HEAD January 08, 2016 at [place] hours FINDINGS: ANTERIOR CIRCULATION: Normal appearance of the cervical internal carotid arteries, petrous, cavernous and supra clinoid internal carotid arteries. Moderate calcific atherosclerosis the carotid siphon. Widely patent anterior communicating artery. Normal appearance of the anterior and middle cerebral arteries. No large vessel occlusion, hemodynamically significant stenosis, dissection, luminal irregularity, contrast extravasation or aneurysm. POSTERIOR CIRCULATION: LEFT vertebral artery is dominant with normal appearance of the vertebral arteries, vertebrobasilar junction and basilar artery, as well as main branch vessels. Robust bilateral posterior communicating arteries. Normal appearance of the posterior cerebral arteries. No large vessel occlusion,  hemodynamically significant stenosis, dissection, luminal irregularity, contrast extravasation or aneurysm. VENOUS SINUSES: Major dural venous sinuses are patent though not tailored for evaluation on this angiographic examination. ANATOMIC VARIANTS: None. DELAYED PHASE:  No abnormal intracranial enhancement. IMPRESSION: Negative CTA HEAD. Electronically Signed   By: Elon Alas M.D.   On: 01/08/2016 22:11   Ct Head Code Stroke W/o Cm  01/08/2016  CLINICAL DATA:  Code stroke. Confusion. Trouble speaking initiated in the afternoon. EXAM: CT HEAD WITHOUT CONTRAST TECHNIQUE: Contiguous axial images were obtained from the base of the skull through the vertex without intravenous contrast. COMPARISON:  CT 03/18/2014 FINDINGS: No acute intracranial hemorrhage. No focal mass lesion. No CT evidence of acute infarction. No midline shift or mass effect. No hydrocephalus. Basilar cisterns are patent. There are periventricular and subcortical white matter hypodensities. Generalized cortical atrophy. Paranasal sinuses and mastoid air cells are clear. Orbits are clear. IMPRESSION: 1. No acute intracranial hemorrhage. 2. No CT evidence of acute cortical infarction. 3. Atrophy and white matter microvascular disease. Findings conveyed toERYKA GAYLE on 01/08/2016  at19:35. Electronically Signed   By: Suzy Bouchard M.D.   On: 01/08/2016 19:39    Assessment/Plan Present on Admission:  . TIA (transient ischemic attack) -Bring in for 23  hour observation on telemetry -Aspirin daily, -Lipid panel in a.m. -Resume home medications. Start metoprolol 25 mg by mouth twice a day, first dose now. When necessary blood pressure medication -Carotid ultrasound, 2-D echo in a.m.  Macular degeneration  -Aware  Jovonne Wilton 01/08/2016, 11:34 PM

## 2016-01-08 NOTE — Progress Notes (Signed)
   01/08/16 2100  Clinical Encounter Type  Visited With Patient and family together  Visit Type Initial  Referral From Nurse  Consult/Referral To Chaplain  Spiritual Encounters  Spiritual Needs Other (Comment)  Stress Factors  Patient Stress Factors None identified  Family Stress Factors None identified  Patient seemed to be in good spirits and feels much better than he did when first coming into the facility.

## 2016-01-08 NOTE — ED Notes (Signed)
Family and SOC machine are at bedside. Patient is alert, comfortable and oriented.

## 2016-01-08 NOTE — ED Provider Notes (Signed)
[facility] Emergency Department Provider Note   ____________________________________________  Time seen: Approximately 7:35 PM  I have reviewed the triage vital signs and the nursing notes.   HISTORY  Chief Complaint Headache; Hypertension; and Code Stroke    HPI Todd Trujillo. is a 82 y.o. male hypertension and BPH who presents for evaluation of headache and word finding difficulties today, gradual onset, constant, no modifying factors, mild to moderate. Patient reports that at approximately 2 PM he developed a mild headache and his blood pressure was elevated at 180/70 at that time. At approximately 3 or 4 PM he was at the grocery store and had an episode where he could not remember how to use a credit card, he has also had some problems getting his words out since that time. No vision change, no numbness or weakness in the arms or legs. No chest pain or difficulty breathing. No recent illness. The patient reports that he accidentally fell out of a hammock last night and hit his head but there was no loss of consciousness.   Past Medical History  Diagnosis Date  . Prostate troubles     Patient was unsure of term  . Macular degeneration of right eye   . Left wrist fracture   . Cancer [facility])     prostate    There are no active problems to display for this patient.   Past Surgical History  Procedure Laterality Date  . Total knee arthroplasty      Current Outpatient Rx  Name  Route  Sig  Dispense  Refill  . ibuprofen (GOODSENSE IBUPROFEN) 200 MG tablet   Oral   Take 800 mg by mouth every 6 (six) hours as needed. For pain.         . tamsulosin (FLOMAX) 0.4 MG CAPS capsule   Oral   Take 0.4 mg by mouth daily.         Marland Kitchen HYDROcodone-acetaminophen (NORCO) 5-325 MG tablet   Oral   Take 1-2 tablets by mouth every 4 (four) hours as needed for moderate pain.   20 tablet   0   . promethazine (PHENERGAN) 25 MG tablet   Oral   Take  1 tablet (25 mg total) by mouth every 6 (six) hours as needed for nausea or vomiting.   12 tablet   0     Allergies Codeine  No family history on file.  Social History Social History  Substance Use Topics  . Smoking status: Never Smoker   . Smokeless tobacco: None  . Alcohol Use: No    Review of Systems Constitutional: No fever/chills Eyes: No visual changes. ENT: No sore throat. Cardiovascular: Denies chest pain. Respiratory: Denies shortness of breath. Gastrointestinal: No abdominal pain.  No nausea, no vomiting.  No diarrhea.  No constipation. Genitourinary: Negative for dysuria. Musculoskeletal: Negative for back pain. Skin: Negative for rash. Neurological: Posittive for headache,no focal weakness or numbness.  10-point ROS otherwise negative.  ____________________________________________   PHYSICAL EXAM:  VITAL SIGNS: ED Triage Vitals  Enc Vitals Group     BP 01/08/16 1919 183/91 mmHg     Pulse Rate 01/08/16 1919 86     Resp 01/08/16 1919 18     Temp 01/08/16 1919 98.1 F (36.7 C)     Temp Source 01/08/16 1919 Oral     SpO2 01/08/16 1919 100 %     Weight 01/08/16 1919 172 lb (78.019 kg)     Height 01/08/16 1919 5'  11" (1.803 m)     Head Cir --      Peak Flow --      Pain Score --      Pain Loc --      Pain Edu? --      Excl. in [place]? --     Constitutional: Alert and oriented. Well appearing and in no acute distress. Eyes: Conjunctivae are normal. PERRL. EOMI. Head: Atraumatic. Nose: No congestion/rhinnorhea. Mouth/Throat: Mucous membranes are moist.  Oropharynx non-erythematous. Neck: No stridor. Supple without meningismus. Cardiovascular: Normal rate, regular rhythm. Grossly normal heart sounds.  Good peripheral circulation. Respiratory: Normal respiratory effort.  No retractions. Lungs CTAB. Gastrointestinal: Soft and nontender. No distention. No CVA tenderness. Genitourinary: deferred Musculoskeletal: No lower extremity tenderness nor edema.   No joint effusions. Neurologic:  The patient occasionally has difficulty remembering the words "hammock" and "visa card" but his speech is fluent for the most part. No dysarthria. He has a faint left facial droop and very faint right tongue deviation. 5 out of 5 strength in bilateral upper and lower extremities, sensation is intact to light touch throughout. Skin:  Skin is warm, dry and intact. No rash noted. Psychiatric: Mood and affect are normal. Speech and behavior are normal.  ____________________________________________   LABS (all labs ordered are listed, but only abnormal results are displayed)  Labs Reviewed  PROTIME-INR - Abnormal; Notable for the following:    Prothrombin Time 16.7 (*)    All other components within normal limits  CBC - Abnormal; Notable for the following:    RBC 4.27 (*)    All other components within normal limits  COMPREHENSIVE METABOLIC PANEL - Abnormal; Notable for the following:    Glucose, Bld 156 (*)    ALT 13 (*)    GFR calc non Af Amer 58 (*)    All other components within normal limits  GLUCOSE, CAPILLARY - Abnormal; Notable for the following:    Glucose-Capillary 136 (*)    All other components within normal limits  URINALYSIS COMPLETEWITH MICROSCOPIC ([facility] ONLY) - Abnormal; Notable for the following:    Color, Urine YELLOW (*)    APPearance CLEAR (*)    All other components within normal limits  APTT  DIFFERENTIAL  TROPONIN I  CBG MONITORING, ED   ____________________________________________  EKG  ED ECG REPORT I, Joanne Gavel, the attending physician, personally viewed and interpreted this ECG.   Date: 01/08/2016  EKG Time:19:21  Rate: 81  Rhythm: normal EKG, normal sinus rhythm  Axis: normal  Intervals:none  ST&T Change: No acute ST elevation.  ____________________________________________  RADIOLOGY  CT head  IMPRESSION: 1. No acute intracranial hemorrhage. 2. No CT evidence of acute cortical infarction. 3.  Atrophy and white matter microvascular disease. Findings conveyed toERYKA Kalee Broxton on 01/08/2016 at19:35. ____________________________________________   PROCEDURES  Procedure(s) performed: None  Critical Care performed: No  ____________________________________________   INITIAL IMPRESSION / ASSESSMENT AND PLAN / ED COURSE  Pertinent labs & imaging results that were available during my care of the patient were reviewed by me and considered in my medical decision making (see chart for details).  Todd Trujillo. is a 82 y.o. male hypertension and BPH who presents for evaluation of headache and word finding difficulties today, gradual onset, constant, no modifying factors, mild to moderate. On exam, he is generally well-appearing and in no acute distress. His vital signs are stable and he is afebrile. In H stroke scale is 2 due to very mild facial droop  and mild tongue deviation. Code stroke initiated on arrival however symptoms have been ongoing for more than 3 hours and given his minimal deficits, he is not a candidate for TPA. We'll obtain screening labs, CT head, anticipate admission.  ----------------------------------------- 10:42 PM on 01/08/2016 ----------------------------------------- Patient resting comfortably. NIH stroke scale is currently 0. Specialist on-call has evaluated the patient, agrees he is not a candidate for TPA, recommends inpatient admission for TIA workup. CT head without contrast and CTA head are negative for any acute intracranial process. CBC CMP, troponin unremarkable. Urinalysis is not consistent with infection. Case discussed with the hospitalist at this time for admission. ____________________________________________   FINAL CLINICAL IMPRESSION(S) / ED DIAGNOSES  Final diagnoses:  Essential hypertension  Acute nonintractable headache, unspecified headache type  Transient cerebral ischemia, unspecified transient cerebral ischemia type       NEW MEDICATIONS STARTED DURING THIS VISIT:  New Prescriptions   No medications on file     Note:  This document was prepared using Dragon voice recognition software and may include unintentional dictation errors.    Joanne Gavel, MD 01/08/16 2245

## 2016-01-08 NOTE — ED Notes (Signed)
Patient left for CT scan. 

## 2016-01-08 NOTE — ED Notes (Signed)
SOC call concluded.

## 2016-01-08 NOTE — ED Notes (Signed)
Pt states has had headache and htn since 1400 today. Pt states he noticied he had a headache and then checked his blood pressure and it was 180/70. Pt states he has felt confused. Pt is currently alert to self, day, place, situation. Pt ambulatory and moving all extremities without difficulty, but appears to have difficulty finding words at times.

## 2016-01-09 ENCOUNTER — Observation Stay: Payer: BC Managed Care – PPO

## 2016-01-09 ENCOUNTER — Observation Stay
Admit: 2016-01-09 | Discharge: 2016-01-09 | Disposition: A | Discharge status: Home / Self Care | Payer: BC Managed Care – PPO | Attending: Family Medicine | Admitting: Family Medicine

## 2016-01-09 DIAGNOSIS — R51 Headache: Secondary | ICD-10-CM | POA: Diagnosis not present

## 2016-01-09 DIAGNOSIS — G459 Transient cerebral ischemic attack, unspecified: Secondary | ICD-10-CM

## 2016-01-09 DIAGNOSIS — R410 Disorientation, unspecified: Secondary | ICD-10-CM | POA: Diagnosis not present

## 2016-01-09 LAB — ECHOCARDIOGRAM COMPLETE
AV Area VTI: 2.09 cm2
AV Peak grad: 8 mmHg
AVPKVEL: 137 cm/s
Ao pk vel: 0.67 m/s
CHL CUP AV PEAK INDEX: 1.09
EERAT: 14.39
EWDT: 264 ms
FS: 31 % (ref 28–44)
Height: 68 in
IV/PV OW: 0.91
LA diam end sys: 37 mm
LA diam index: 1.94 cm/m2
LASIZE: 37 mm
LAVOL: 64 mL
LAVOLA4C: 63.7 mL
LAVOLIN: 33.5 mL/m2
LV E/e' medial: 14.39
LV E/e'average: 14.39
LV e' LATERAL: 6.74 cm/s
LVOT area: 3.14 cm2
LVOTD: 20 mm
LVOTPV: 91.2 cm/s
MV Dec: 264
MV pk E vel: 97 m/s
MVPG: 4 mmHg
MVPKAVEL: 83.4 m/s
PW: 12.5 mm — AB (ref 0.6–1.1)
RV TAPSE: 21.9 mm
TDI e' lateral: 6.74
TDI e' medial: 6.2
Weight: 2745.6 oz

## 2016-01-09 LAB — BASIC METABOLIC PANEL
Anion gap: 4 — ABNORMAL LOW (ref 5–15)
BUN: 13 mg/dL (ref 6–20)
CALCIUM: 8.4 mg/dL — AB (ref 8.9–10.3)
CHLORIDE: 110 mmol/L (ref 101–111)
CO2: 24 mmol/L (ref 22–32)
CREATININE: 0.75 mg/dL (ref 0.61–1.24)
GFR calc non Af Amer: >60 mL/min (ref >60)
GLUCOSE: 100 mg/dL — AB (ref 65–99)
Potassium: 3.7 mmol/L (ref 3.5–5.1)
Sodium: 138 mmol/L (ref 135–145)

## 2016-01-09 LAB — CBC
HCT: 36.9 % — ABNORMAL LOW (ref 40.0–52.0)
Hemoglobin: 12.7 g/dL — ABNORMAL LOW (ref 13.0–18.0)
MCH: 32.2 pg (ref 26.0–34.0)
MCHC: 34.4 g/dL (ref 32.0–36.0)
MCV: 93.7 fL (ref 80.0–100.0)
PLATELETS: 172 10*3/uL (ref 150–440)
RBC: 3.94 MIL/uL — AB (ref 4.40–5.90)
RDW: 13.7 % (ref 11.5–14.5)
WBC: 7.4 10*3/uL (ref 3.8–10.6)

## 2016-01-09 LAB — TROPONIN I: Troponin I: <0.03 ng/mL (ref <0.031)

## 2016-01-09 LAB — LIPID PANEL
Cholesterol: 208 mg/dL — ABNORMAL HIGH (ref 0–200)
HDL: 47 mg/dL (ref >40)
LDL CALC: 145 mg/dL — AB (ref 0–99)
Total CHOL/HDL Ratio: 4.4 RATIO
Triglycerides: 80 mg/dL (ref <150)
VLDL: 16 mg/dL (ref 0–40)

## 2016-01-09 MED ORDER — ATORVASTATIN CALCIUM 40 MG PO TABS: 40.0000 mg | ORAL_TABLET | Freq: Every day | ORAL | Status: DC

## 2016-01-09 MED ORDER — METOPROLOL TARTRATE 25 MG PO TABS: 25.0000 mg | ORAL_TABLET | Freq: Every morning | ORAL | Status: DC

## 2016-01-09 MED ORDER — ONDANSETRON HCL 4 MG/2ML IJ SOLN: 4.0000 mg | Freq: Four times a day (QID) | INTRAMUSCULAR | Status: DC | PRN

## 2016-01-09 MED ORDER — ACETAMINOPHEN 325 MG PO TABS: 650.0000 mg | ORAL_TABLET | Freq: Four times a day (QID) | ORAL | Status: DC | PRN

## 2016-01-09 MED ORDER — SENNOSIDES-DOCUSATE SODIUM 8.6-50 MG PO TABS: 1.0000 | ORAL_TABLET | Freq: Every evening | ORAL | Status: DC | PRN

## 2016-01-09 MED ORDER — ACETAMINOPHEN 650 MG RE SUPP: 650.0000 mg | Freq: Four times a day (QID) | RECTAL | Status: DC | PRN

## 2016-01-09 MED ORDER — ASPIRIN EC 81 MG PO TBEC: 81.0000 mg | DELAYED_RELEASE_TABLET | Freq: Every day | ORAL | Status: DC

## 2016-01-09 MED ORDER — METOPROLOL TARTRATE 5 MG/5ML IV SOLN: 5.0000 mg | INTRAVENOUS | Status: DC | PRN

## 2016-01-09 MED ORDER — TAMSULOSIN HCL 0.4 MG PO CAPS
0.4000 mg | ORAL_CAPSULE | Freq: Every day | ORAL | Status: DC
  Administered 2016-01-09: 09:00:00 0.4 mg via ORAL
  Filled 2016-01-09: qty 1

## 2016-01-09 MED ORDER — ENOXAPARIN SODIUM 40 MG/0.4ML ~~LOC~~ SOLN
40.0000 mg | Freq: Every day | SUBCUTANEOUS | Status: DC
  Administered 2016-01-09: 01:00:00 40 mg via SUBCUTANEOUS
  Filled 2016-01-09: qty 0.4

## 2016-01-09 MED ORDER — ONDANSETRON HCL 4 MG PO TABS: 4.0000 mg | ORAL_TABLET | Freq: Four times a day (QID) | ORAL | Status: DC | PRN

## 2016-01-09 NOTE — Care Management Note (Signed)
Case Management Note  Patient Details  Name: Todd Trujillo. MRN: [id number] Date of Birth: [birth year]  Subjective/Objective:                   Met with patient's family to discuss discharge planning. Patient was out of the room for studies. Patient still works (2nd shift). His PCP is Dr. Doy Hutching. He lives with his wife. She denies any discharge needs. His pharmacy is [facility].  Action/Plan: List of home health agencies left   Expected Discharge Date:                  Expected Discharge Plan:     In-House Referral:     Discharge planning Services  CM Consult  Post Acute Care Choice:  Home Health Choice offered to:  Patient  DME Arranged:    DME Agency:     HH Arranged:    [place] Agency:     Status of Service:  In process, will continue to follow  If discussed at Long Length of Stay Meetings, dates discussed:    Additional Comments:  Marshell Garfinkel, RN 01/09/2016, 1:02 PM

## 2016-01-09 NOTE — Progress Notes (Signed)
*  PRELIMINARY RESULTS* Echocardiogram 2D Echocardiogram has been performed.  Sherrie Sport 01/09/2016, 11:19 AM

## 2016-01-09 NOTE — Consult Note (Signed)
Referring Physician: Manuella Ghazi    Chief Complaint: Confusion  HPI: Todd Trujillo. is an 82 y.o. male who returned from the beach on yesterday and seemed at baseline.  Laid own for a nap in the afternoon and when he awakened seemed confused.  Went to make cereal and cold not find the cereal or the milk.  Went to the grocery store to get milk and could not figure out how to use his credit card.  Came back home still confused.  BP was checked and was elevated.  Patient was brought in for evaluation at that time. Initial NIHSS of 0.    Date last known well: 01/08/2016 Time last known well: Time: 16:00 tPA Given: No: Mild symptoms  Past Medical History  Diagnosis Date  . Prostate troubles     Patient was unsure of term  . Macular degeneration of right eye   . Left wrist fracture   . Cancer [facility])     prostate    Past Surgical History  Procedure Laterality Date  . Total knee arthroplasty      Family history: Father died at 77 of old age.  Mother deceased from stomach cancer.  Had two siblings, one with prostate cancer and one with colon cancer.  Social History:  reports that he has never smoked. He does not have any smokeless tobacco history on file. He reports that he does not drink alcohol. His drug history is not on file.  Allergies:  Allergies  Allergen Reactions  . Codeine Nausea And Vomiting    Medications:  I have reviewed the patient's current medications. Prior to Admission:  Prescriptions prior to admission  Medication Sig Dispense Refill Last Dose  . ibuprofen (GOODSENSE IBUPROFEN) 200 MG tablet Take 800 mg by mouth every 6 (six) hours as needed. For pain.   01/09/2016 at 1800  . tamsulosin (FLOMAX) 0.4 MG CAPS capsule Take 0.4 mg by mouth daily.   01/09/2016 at 1200  . HYDROcodone-acetaminophen (NORCO) 5-325 MG tablet Take 1-2 tablets by mouth every 4 (four) hours as needed for moderate pain. (Patient not taking: Reported on 01/09/2016) 20 tablet 0 Not Taking at  Unknown time  . promethazine (PHENERGAN) 25 MG tablet Take 1 tablet (25 mg total) by mouth every 6 (six) hours as needed for nausea or vomiting. (Patient not taking: Reported on 01/09/2016) 12 tablet 0 Not Taking at Unknown time   Scheduled: . aspirin  324 mg Oral Once  . aspirin EC  81 mg Oral Daily  . enoxaparin (LOVENOX) injection  40 mg Subcutaneous QHS  . metoprolol tartrate  25 mg Oral BID  . tamsulosin  0.4 mg Oral Daily    ROS: History obtained from the patient  General ROS: negative for - chills, fatigue, fever, night sweats, weight gain or weight loss Psychological ROS: negative for - behavioral disorder, hallucinations, memory difficulties, mood swings or suicidal ideation Ophthalmic ROS: poor vision ENT ROS: negative for - epistaxis, nasal discharge, oral lesions, sore throat, tinnitus or vertigo Allergy and Immunology ROS: negative for - hives or itchy/watery eyes Hematological and Lymphatic ROS: negative for - bleeding problems, bruising or swollen lymph nodes Endocrine ROS: negative for - galactorrhea, hair pattern changes, polydipsia/polyuria or temperature intolerance Respiratory ROS: negative for - cough, hemoptysis, shortness of breath or wheezing Cardiovascular ROS: negative for - chest pain, dyspnea on exertion, edema or irregular heartbeat Gastrointestinal ROS: negative for - abdominal pain, diarrhea, hematemesis, nausea/vomiting or stool incontinence Genito-Urinary ROS: negative for - dysuria,  hematuria, incontinence or urinary frequency/urgency Musculoskeletal ROS: negative for - joint swelling or muscular weakness Neurological ROS: as noted in HPI Dermatological ROS: negative for rash and skin lesion changes  Physical Examination: Blood pressure 137/60, pulse 72, temperature 98.1 F (36.7 C), temperature source Oral, resp. rate 18, height 5\' 8"  (1.727 m), weight 77.837 kg (171 lb 9.6 oz), SpO2 98 %.  HEENT-  Normocephalic, no lesions, without obvious  abnormality.  Normal external eye and conjunctiva.  Normal TM's bilaterally.  Normal auditory canals and external ears. Normal external nose, mucus membranes and septum.  Normal pharynx. Cardiovascular- S1, S2 normal, pulses palpable throughout   Lungs- chest clear, no wheezing, rales, normal symmetric air entry Abdomen- soft, non-tender; bowel sounds normal; no masses,  no organomegaly Extremities- no edema Lymph-no adenopathy palpable Musculoskeletal-no joint tenderness, deformity or swelling Skin-warm and dry, no hyperpigmentation, vitiligo, or suspicious lesions  Neurological Examination Mental Status: Alert, oriented, thought content appropriate.  Speech fluent without evidence of aphasia.  Some mild word finding difficulties at times.  Able to follow 3 step commands without difficulty. Cranial Nerves: II: Discs flat bilaterally; Visual fields grossly normal, pupils equal, round, reactive to light and accommodation III,IV, VI: ptosis not present, extra-ocular motions intact bilaterally V,VII: smile symmetric, facial light touch sensation normal bilaterally VIII: hearing normal bilaterally IX,X: gag reflex present XI: bilateral shoulder shrug XII: midline tongue extension Motor: Right : Upper extremity   5/5    Left:     Upper extremity   5/5  Lower extremity   5/5     Lower extremity   5/5 Tone and bulk:normal tone throughout; no atrophy noted Sensory: Pinprick and light touch intact throughout, bilaterally Deep Tendon Reflexes: 2+ and symmetric throughout Plantars: Right: downgoing   Left: downgoing Cerebellar: Normal finger-to-nose and normal heel-to-shin testing bilaterally Gait: not tested due to safety concerns   Laboratory Studies:  Basic Metabolic Panel:  Recent Labs Lab 01/08/16 1926 01/09/16 0559  NA 138 138  K 3.7 3.7  CL 105 110  CO2 25 24  GLUCOSE 156* 100*  BUN 18 13  CREATININE 1.13 0.75  CALCIUM 9.2 8.4*    Liver Function Tests:  Recent Labs Lab  01/08/16 1926  AST 23  ALT 13*  ALKPHOS 75  BILITOT 0.6  PROT 6.9  ALBUMIN 4.3   No results for input(s): LIPASE, AMYLASE in the last 168 hours. No results for input(s): AMMONIA in the last 168 hours.  CBC:  Recent Labs Lab 01/08/16 1926 01/09/16 0559  WBC 7.7 7.4  NEUTROABS 4.0  --   HGB 13.6 12.7*  HCT 40.0 36.9*  MCV 93.8 93.7  PLT 184 172    Cardiac Enzymes:  Recent Labs Lab 01/08/16 1926 01/09/16 0041 01/09/16 0559 01/09/16 1257  TROPONINI <0.03 <0.03 <0.03 <0.03    BNP: Invalid input(s): POCBNP  CBG:  Recent Labs Lab 01/08/16 1956  GLUCAP 136*    Microbiology: No results found for this or any previous visit.  Coagulation Studies:  Recent Labs  01/08/16 1926  LABPROT 16.7*  INR 1.34    Urinalysis:  Recent Labs Lab 01/08/16 2125  COLORURINE YELLOW*  LABSPEC 1.021  PHURINE 6.0  GLUCOSEU NEGATIVE  HGBUR NEGATIVE  BILIRUBINUR NEGATIVE  KETONESUR NEGATIVE  PROTEINUR NEGATIVE  NITRITE NEGATIVE  LEUKOCYTESUR NEGATIVE    Lipid Panel:    Component Value Date/Time   CHOL 208* 01/09/2016 0041   TRIG 80 01/09/2016 0041   HDL 47 01/09/2016 0041   CHOLHDL 4.4 01/09/2016  0041   VLDL 16 01/09/2016 0041   LDLCALC 145* 01/09/2016 0041    HgbA1C: No results found for: HGBA1C  Urine Drug Screen:  No results found for: LABOPIA, COCAINSCRNUR, LABBENZ, AMPHETMU, THCU, LABBARB  Alcohol Level: No results for input(s): ETH in the last 168 hours.  Other results: EKG: sinus rhythm at 81 bpm.  Imaging: Ct Angio Head W/cm &/or Wo Cm  01/08/2016  CLINICAL DATA:  Headache and hypertension beginning at 1400 hours today. Confusion. Word-finding difficulties, assess for stroke. History of cancer. EXAM: CT ANGIOGRAPHY HEAD TECHNIQUE: Multidetector CT imaging of the head was performed using the standard protocol during bolus administration of intravenous contrast. Multiplanar CT image reconstructions and MIPs were obtained to evaluate the vascular  anatomy. CONTRAST:  75 cc Isovue 370 COMPARISON:  CT HEAD January 08, 2016 at [place] hours FINDINGS: ANTERIOR CIRCULATION: Normal appearance of the cervical internal carotid arteries, petrous, cavernous and supra clinoid internal carotid arteries. Moderate calcific atherosclerosis the carotid siphon. Widely patent anterior communicating artery. Normal appearance of the anterior and middle cerebral arteries. No large vessel occlusion, hemodynamically significant stenosis, dissection, luminal irregularity, contrast extravasation or aneurysm. POSTERIOR CIRCULATION: LEFT vertebral artery is dominant with normal appearance of the vertebral arteries, vertebrobasilar junction and basilar artery, as well as main branch vessels. Robust bilateral posterior communicating arteries. Normal appearance of the posterior cerebral arteries. No large vessel occlusion, hemodynamically significant stenosis, dissection, luminal irregularity, contrast extravasation or aneurysm. VENOUS SINUSES: Major dural venous sinuses are patent though not tailored for evaluation on this angiographic examination. ANATOMIC VARIANTS: None. DELAYED PHASE:  No abnormal intracranial enhancement. IMPRESSION: Negative CTA HEAD. Electronically Signed   By: Elon Alas M.D.   On: 01/08/2016 22:11   Mr Brain Wo Contrast  01/09/2016  CLINICAL DATA:  82 year old hypertensive male with headache and confusion. History prostate cancer. Subsequent encounter. EXAM: MRI HEAD WITHOUT CONTRAST TECHNIQUE: Multiplanar, multiecho pulse sequences of the brain and surrounding structures were obtained without intravenous contrast. COMPARISON:  CT and CT angiogram 01/08/2016.  No comparison MR. FINDINGS: No acute infarct or intracranial hemorrhage. Moderate chronic microvascular changes. Mild to moderate global atrophy without hydrocephalus. No intracranial mass lesion noted on this unenhanced exam. Major intracranial vascular structures are patent. Left mastoid air cell  opacification without obstructing lesion of the eustachian tube noted. Minimal to mild mucosal thickening ethmoid sinus air cells. Post lens replacement without acute orbital abnormality. Mild spinal stenosis C3-4 incompletely assessed on present exam. Mild transverse ligament hypertrophy. Small pituitary gland. IMPRESSION: No acute infarct or intracranial hemorrhage. Moderate chronic microvascular changes. Atrophy without hydrocephalus. Opacification left mastoid air cells. Electronically Signed   By: Genia Del M.D.   On: 01/09/2016 11:27   Ct Head Code Stroke W/o Cm  01/08/2016  CLINICAL DATA:  Code stroke. Confusion. Trouble speaking initiated in the afternoon. EXAM: CT HEAD WITHOUT CONTRAST TECHNIQUE: Contiguous axial images were obtained from the base of the skull through the vertex without intravenous contrast. COMPARISON:  CT 03/18/2014 FINDINGS: No acute intracranial hemorrhage. No focal mass lesion. No CT evidence of acute infarction. No midline shift or mass effect. No hydrocephalus. Basilar cisterns are patent. There are periventricular and subcortical white matter hypodensities. Generalized cortical atrophy. Paranasal sinuses and mastoid air cells are clear. Orbits are clear. IMPRESSION: 1. No acute intracranial hemorrhage. 2. No CT evidence of acute cortical infarction. 3. Atrophy and white matter microvascular disease. Findings conveyed toERYKA GAYLE on 01/08/2016  at19:35. Electronically Signed   By: Helane Gunther.D.  On: 01/08/2016 19:39    Assessment: 82 y.o. male presenting with confusion.  Patient now improved.  MRI of the brain personally reviewed and shows no acute changes.  CTA unremarkable  Echocardiogram shows no cardiac source of emboli with Ef of 60-65%.  LDL 145.   BP elevated with symptoms and symptoms may be related to this.  Can not rule out TIA.  Per report of patient, he is unable to tolerate antiplatelet therapy and/or anticoagulation due to his macular degeneration,  therefore will not initiate.    Stroke Risk Factors - none  Plan: Work up appropriate.  Antiplatelet therapy not started due to contraindications.  BP controlled.  Would consider statin initiation.  No further neurologic intervention is recommended at this time.  If further questions arise, please call or page at that time.  Thank you for allowing neurology to participate in the care of this patient.    Alexis Goodell, MD Neurology [phone] 01/09/2016, 2:58 PM

## 2016-01-09 NOTE — Progress Notes (Signed)
Please note that pt/family refusing asa due to macular degeneration/hemorrhage after given asa in past.  LDL is 145 - prescribed high intensity statin  No residual deficits so no PT consult, OT consult, Speech consult need

## 2016-01-09 NOTE — Discharge Instructions (Signed)
Please note that pt/family refusing asa due to macular degeneration.  LDL is 145. High intensity Statin is ordered  Patient doesn't have ay deficit so don't need PT consult, OT consult, Speech consult.    Transient Ischemic Attack A transient ischemic attack (TIA) is a "warning stroke" that causes stroke-like symptoms. Unlike a stroke, a TIA does not cause permanent damage to the brain. The symptoms of a TIA can happen very fast and do not last long. It is important to know the symptoms of a TIA and what to do. This can help prevent a major stroke or death. CAUSES  A TIA is caused by a temporary blockage in an artery in the brain or neck (carotid artery). The blockage does not allow the brain to get the blood supply it needs and can cause different symptoms. The blockage can be caused by either:  A blood clot.  Fatty buildup (plaque) in a neck or brain artery. RISK FACTORS  High blood pressure (hypertension).  High cholesterol.  Diabetes mellitus.  Heart disease.  The buildup of plaque in the blood vessels (peripheral artery disease or atherosclerosis).  The buildup of plaque in the blood vessels that provide blood and oxygen to the brain (carotid artery stenosis).  An abnormal heart rhythm (atrial fibrillation).  Obesity.  Using any tobacco products, including cigarettes, chewing tobacco, or electronic cigarettes.  Taking oral contraceptives, especially in combination with using tobacco.  Physical inactivity.  A diet high in fats, salt (sodium), and calories.  Excessive alcohol use.  Use of illegal drugs (especially cocaine and methamphetamine).  Being male.  Being African American.  Being over the age of 17 years.  Family history of stroke.  Previous history of blood clots, stroke, TIA, or heart attack.  Sickle cell disease. SIGNS AND SYMPTOMS  TIA symptoms are the same as a stroke but are temporary. These symptoms usually develop suddenly, or may be newly  present upon waking from sleep:  Sudden weakness or numbness of the face, arm, or leg, especially on one side of the body.  Sudden trouble walking or difficulty moving arms or legs.  Sudden confusion.  Sudden personality changes.  Trouble speaking (aphasia) or understanding.  Difficulty swallowing.  Sudden trouble seeing in one or both eyes.  Double vision.  Dizziness.  Loss of balance or coordination.  Sudden severe headache with no known cause.  Trouble reading or writing.  Loss of bowel or bladder control.  Loss of consciousness. DIAGNOSIS  Your health care provider may be able to determine the presence or absence of a TIA based on your symptoms, history, and physical exam. CT scan of the brain is usually performed to help identify a TIA. Other tests may include:  Electrocardiography (ECG).  Continuous heart monitoring.  Echocardiography.  Carotid ultrasonography.  MRI.  A scan of the brain circulation.  Blood tests. TREATMENT  Since the symptoms of TIA are the same as a stroke, it is important to seek treatment as soon as possible. You may need a medicine to dissolve a blood clot (thrombolytic) if that is the cause of the TIA. This medicine cannot be given if too much time has passed. Treatment may also include:   Rest, oxygen, fluids through an IV tube, and medicines to thin the blood (anticoagulants).  Measures will be taken to prevent short-term and long-term complications, including infection from breathing foreign material into the lungs (aspiration pneumonia), blood clots in the legs, and falls.  Procedures to either remove plaque in the  carotid arteries or dilate carotid arteries that have narrowed due to plaque. Those procedures are:  Carotid endarterectomy.  Carotid angioplasty and stenting.  Medicines and diet may be used to address diabetes, high blood pressure, and other underlying risk factors. HOME CARE INSTRUCTIONS   Take medicines only  as directed by your health care provider. Follow the directions carefully. Medicines may be used to control risk factors for a stroke. Be sure you understand all your medicine instructions.  You may be told to take aspirin or the anticoagulant warfarin. Warfarin needs to be taken exactly as instructed.  Taking too much or too little warfarin is dangerous. Too much warfarin increases the risk of bleeding. Too little warfarin continues to allow the risk for blood clots. While taking warfarin, you will need to have regular blood tests to measure your blood clotting time. A PT blood test measures how long it takes for blood to clot. Your PT is used to calculate another value called an INR. Your PT and INR help your health care provider to adjust your dose of warfarin. The dose can change for many reasons. It is critically important that you take warfarin exactly as prescribed.  Many foods, especially foods high in vitamin K can interfere with warfarin and affect the PT and INR. Foods high in vitamin K include spinach, kale, broccoli, cabbage, collard and turnip greens, Brussels sprouts, peas, cauliflower, seaweed, and parsley, as well as beef and pork liver, green tea, and soybean oil. You should eat a consistent amount of foods high in vitamin K. Avoid major changes in your diet, or notify your health care provider before changing your diet. Arrange a visit with a dietitian to answer your questions.  Many medicines can interfere with warfarin and affect the PT and INR. You must tell your health care provider about any and all medicines you take; this includes all vitamins and supplements. Be especially cautious with aspirin and anti-inflammatory medicines. Do not take or discontinue any prescribed or over-the-counter medicine except on the advice of your health care provider or pharmacist.  Warfarin can have side effects, such as excessive bruising or bleeding. You will need to hold pressure over cuts for  longer than usual. Your health care provider or pharmacist will discuss other potential side effects.  Avoid sports or activities that may cause injury or bleeding.  Be careful when shaving, flossing your teeth, or handling sharp objects.  Alcohol can change the body's ability to handle warfarin. It is best to avoid alcoholic drinks or consume only very small amounts while taking warfarin. Notify your health care provider if you change your alcohol intake.  Notify your dentist or other health care providers before procedures.  Eat a diet that includes 5 or more servings of fruits and vegetables each day. This may reduce the risk of stroke. Certain diets may be prescribed to address high blood pressure, high cholesterol, diabetes, or obesity.  A diet low in sodium, saturated fat, trans fat, and cholesterol is recommended to manage high blood pressure.  A diet low in saturated fat, trans fat, and cholesterol, and high in fiber may control cholesterol levels.  A controlled-carbohydrate, controlled-sugar diet is recommended to manage diabetes.  A reduced-calorie diet that is low in sodium, saturated fat, trans fat, and cholesterol is recommended to manage obesity.  Maintain a healthy weight.  Stay physically active. It is recommended that you get at least 30 minutes of activity on most or all days.  Do not use any  tobacco products, including cigarettes, chewing tobacco, or electronic cigarettes. If you need help quitting, ask your health care provider.  Limit alcohol intake to no more than 1 drink per day for nonpregnant women and 2 drinks per day for men. One drink equals 12 ounces of beer, 5 ounces of wine, or 1 ounces of hard liquor.  Do not abuse drugs.  A safe home environment is important to reduce the risk of falls. Your health care provider may arrange for specialists to evaluate your home. Having grab bars in the bedroom and bathroom is often important. Your health care provider  may arrange for equipment to be used at home, such as raised toilets and a seat for the shower.  Follow all instructions for follow-up with your health care provider. This is very important. This includes any referrals and lab tests. Proper follow-up can prevent a stroke or another TIA from occurring. PREVENTION  The risk of a TIA can be decreased by appropriately treating high blood pressure, high cholesterol, diabetes, heart disease, and obesity, and by quitting smoking, limiting alcohol, and staying physically active. SEEK MEDICAL CARE IF:  You have personality changes.  You have difficulty swallowing.  You are seeing double.  You have dizziness.  You have a fever. SEEK IMMEDIATE MEDICAL CARE IF:  Any of the following symptoms may represent a serious problem that is an emergency. Do not wait to see if the symptoms will go away. Get medical help right away. Call your local emergency services (911 in [place] Do not drive yourself to the hospital.  You have sudden weakness or numbness of the face, arm, or leg, especially on one side of the body.  You have sudden trouble walking or difficulty moving arms or legs.  You have sudden confusion.  You have trouble speaking (aphasia) or understanding.  You have sudden trouble seeing in one or both eyes.  You have a loss of balance or coordination.  You have a sudden, severe headache with no known cause.  You have new chest pain or an irregular heartbeat.  You have a partial or total loss of consciousness. MAKE SURE YOU:   Understand these instructions.  Will watch your condition.  Will get help right away if you are not doing well or get worse.   This information is not intended to replace advice given to you by your health care provider. Make sure you discuss any questions you have with your health care provider.   Document Released: 04/11/2005 Document Revised: 07/23/2014 Document Reviewed: 10/07/2013 Elsevier Interactive  Patient Education [employer].

## 2016-01-09 NOTE — Progress Notes (Signed)
Discharge paperwork reviewed with patient and patient's daughter with verbal understanding noted of prescriptions and follow-up appointments. Patient discharged home, transported by daughter by car.

## 2016-01-12 NOTE — Discharge Summary (Signed)
[place] [facility] [place] NAME: Todd Trujillo    MR#:  [id number]  DATE OF BIRTH:  September 29, 1929  DATE OF ADMISSION:  01/08/2016 ADMITTING PHYSICIAN: Quintella Baton, MD  DATE OF DISCHARGE: 01/09/2016  3:52 PM  PRIMARY CARE PHYSICIAN: SPARKS,JEFFREY D, MD    ADMISSION DIAGNOSIS:  Essential hypertension [I10] Transient cerebral ischemia, unspecified transient cerebral ischemia type [G45.9] Acute nonintractable headache, unspecified headache type [R51]  DISCHARGE DIAGNOSIS:  Active Problems:   TIA (transient ischemic attack)   Macular degeneration   Prostate cancer ([place])   SECONDARY DIAGNOSIS:   Past Medical History  Diagnosis Date  . Prostate troubles     Patient was unsure of term  . Macular degeneration of right eye   . Left wrist fracture   . Cancer [facility])     prostate    HOSPITAL COURSE:  82 y.o. male admitted with confusion. Patient now improved. MRI of the brain showed no acute changes. CTA unremarkable Echocardiogram shows no cardiac source of emboli with Ef of 60-65%. LDL 145.  BP elevated with symptoms and symptoms may be related to this. Can not rule out TIA. Per report of patient, he is unable to tolerate antiplatelet therapy and/or anticoagulation due to his macular degeneration, therefore will not initiate. Patient/Family refused.  DISCHARGE CONDITIONS:   stable  CONSULTS OBTAINED:  Treatment Team:  Alexis Goodell, MD  DRUG ALLERGIES:   Allergies  Allergen Reactions  . Codeine Nausea And Vomiting    DISCHARGE MEDICATIONS:   Discharge Medication List as of 01/09/2016  3:19 PM    START taking these medications   Details  atorvastatin (LIPITOR) 40 MG tablet Take 1 tablet (40 mg total) by mouth daily., Starting 01/09/2016, Until Discontinued, Normal    metoprolol tartrate (LOPRESSOR) 25 MG tablet Take 1 tablet (25 mg total) by mouth every morning., Starting 01/09/2016, Until Discontinued,  Normal      CONTINUE these medications which have NOT CHANGED   Details  ibuprofen (GOODSENSE IBUPROFEN) 200 MG tablet Take 800 mg by mouth every 6 (six) hours as needed. For pain., Until Discontinued, Historical Med    tamsulosin (FLOMAX) 0.4 MG CAPS capsule Take 0.4 mg by mouth daily., Starting 10/12/2015, Until Discontinued, Historical Med    HYDROcodone-acetaminophen (NORCO) 5-325 MG tablet Take 1-2 tablets by mouth every 4 (four) hours as needed for moderate pain., Starting 07/24/2015, Until Discontinued, Print    promethazine (PHENERGAN) 25 MG tablet Take 1 tablet (25 mg total) by mouth every 6 (six) hours as needed for nausea or vomiting., Starting 07/24/2015, Until Discontinued, Print         DISCHARGE INSTRUCTIONS:    DIET:  Regular diet  DISCHARGE CONDITION:  Good  ACTIVITY:  Activity as tolerated  OXYGEN:  Home Oxygen: No.   Oxygen Delivery: room air  DISCHARGE LOCATION:  home   If you experience worsening of your admission symptoms, develop shortness of breath, life threatening emergency, suicidal or homicidal thoughts you must seek medical attention immediately by calling 911 or calling your MD immediately  if symptoms less severe.  You Must read complete instructions/literature along with all the possible adverse reactions/side effects for all the Medicines you take and that have been prescribed to you. Take any new Medicines after you have completely understood and accpet all the possible adverse reactions/side effects.   Please note  You were cared for by a hospitalist during your hospital stay. If you have any questions about your discharge  medications or the care you received while you were in the hospital after you are discharged, you can call the unit and asked to speak with the hospitalist on call if the hospitalist that took care of you is not available. Once you are discharged, your primary care physician will handle any further medical issues. Please note  that NO REFILLS for any discharge medications will be authorized once you are discharged, as it is imperative that you return to your primary care physician (or establish a relationship with a primary care physician if you do not have one) for your aftercare needs so that they can reassess your need for medications and monitor your lab values.    On the day of Discharge:  VITAL SIGNS:  Blood pressure 137/60, pulse 72, temperature 98.1 F (36.7 C), temperature source Oral, resp. rate 18, height 5\' 8"  (1.727 m), weight 77.837 kg (171 lb 9.6 oz), SpO2 98 %. PHYSICAL EXAMINATION:  GENERAL:  82 y.o.-year-old patient lying in the bed with no acute distress.  EYES: Pupils equal, round, reactive to light and accommodation. No scleral icterus. Extraocular muscles intact.  HEENT: Head atraumatic, normocephalic. Oropharynx and nasopharynx clear.  NECK:  Supple, no jugular venous distention. No thyroid enlargement, no tenderness.  LUNGS: Normal breath sounds bilaterally, no wheezing, rales,rhonchi or crepitation. No use of accessory muscles of respiration.  CARDIOVASCULAR: S1, S2 normal. No murmurs, rubs, or gallops.  ABDOMEN: Soft, non-tender, non-distended. Bowel sounds present. No organomegaly or mass.  EXTREMITIES: No pedal edema, cyanosis, or clubbing.  NEUROLOGIC: Cranial nerves II through XII are intact. Muscle strength 5/5 in all extremities. Sensation intact. Gait not checked.  PSYCHIATRIC: The patient is alert and oriented x 3.  SKIN: No obvious rash, lesion, or ulcer.  DATA REVIEW:   CBC  Recent Labs Lab 01/09/16 0559  WBC 7.4  HGB 12.7*  HCT 36.9*  PLT 172    Chemistries   Recent Labs Lab 01/08/16 1926 01/09/16 0559  NA 138 138  K 3.7 3.7  CL 105 110  CO2 25 24  GLUCOSE 156* 100*  BUN 18 13  CREATININE 1.13 0.75  CALCIUM 9.2 8.4*  AST 23  --   ALT 13*  --   ALKPHOS 75  --   BILITOT 0.6  --     Follow-up Information    Follow up with Mortimer Fries, PA-C. Go on  01/16/2016.   Specialty:  Physician Assistant   Why:  @ 1:30PM   Contact information:   [address] [place] [address] [phone]       Follow up with [facility], MD. Go on 01/26/2016.   Specialty:  Neurology   Why:  @3 :00 PM PLEASE ARRIVE 30MIN EARLY    Contact information:   [address] [place]  [id number] [phone]       Management plans discussed with the patient, family and they are in agreement.  CODE STATUS:  Code Status History    Date Active Date Inactive Code Status Order ID Comments User Context   01/09/2016 12:15 AM 01/09/2016  6:52 PM Full Code [id number]  Quintella Baton, MD Inpatient      TOTAL TIME TAKING CARE OF THIS PATIENT: 45 minutes.    [facility], Kjersti Dittmer M.D on 01/12/2016 at 5:54 PM  Between 7am to 6pm - Pager - [phone]  After 6pm go to [email or web address] - password [email or web address] [facility]  [place] [facility]  Office  [phone]  CC: Primary  care physician; Idelle Crouch, MD   Note: This dictation was prepared with Dragon dictation along with smaller phrase technology. Any transcriptional errors that result from this process are unintentional.

## 2016-01-18 DIAGNOSIS — Z1329 Encounter for screening for other suspected endocrine disorder: Secondary | ICD-10-CM | POA: Diagnosis not present

## 2016-01-18 DIAGNOSIS — E538 Deficiency of other specified B group vitamins: Secondary | ICD-10-CM | POA: Diagnosis not present

## 2016-01-18 DIAGNOSIS — Z79899 Other long term (current) drug therapy: Secondary | ICD-10-CM | POA: Diagnosis not present

## 2016-01-18 DIAGNOSIS — I10 Essential (primary) hypertension: Secondary | ICD-10-CM | POA: Diagnosis not present

## 2016-01-18 DIAGNOSIS — E7800 Pure hypercholesterolemia, unspecified: Secondary | ICD-10-CM | POA: Diagnosis not present

## 2016-01-18 DIAGNOSIS — G459 Transient cerebral ischemic attack, unspecified: Secondary | ICD-10-CM | POA: Diagnosis not present

## 2016-01-18 DIAGNOSIS — R630 Anorexia: Secondary | ICD-10-CM | POA: Diagnosis not present

## 2016-01-18 DIAGNOSIS — R4182 Altered mental status, unspecified: Secondary | ICD-10-CM | POA: Diagnosis not present

## 2016-01-18 DIAGNOSIS — I739 Peripheral vascular disease, unspecified: Secondary | ICD-10-CM | POA: Diagnosis not present

## 2016-01-26 DIAGNOSIS — G451 Carotid artery syndrome (hemispheric): Secondary | ICD-10-CM | POA: Diagnosis not present

## 2016-01-26 DIAGNOSIS — R410 Disorientation, unspecified: Secondary | ICD-10-CM | POA: Diagnosis not present

## 2016-02-01 DIAGNOSIS — H353212 Exudative age-related macular degeneration, right eye, with inactive choroidal neovascularization: Secondary | ICD-10-CM | POA: Diagnosis not present

## 2016-02-02 DIAGNOSIS — R339 Retention of urine, unspecified: Secondary | ICD-10-CM | POA: Diagnosis not present

## 2016-02-02 DIAGNOSIS — N320 Bladder-neck obstruction: Secondary | ICD-10-CM | POA: Diagnosis not present

## 2016-02-02 DIAGNOSIS — N281 Cyst of kidney, acquired: Secondary | ICD-10-CM | POA: Diagnosis not present

## 2016-02-02 DIAGNOSIS — C61 Malignant neoplasm of prostate: Secondary | ICD-10-CM | POA: Diagnosis not present

## 2016-02-02 DIAGNOSIS — N529 Male erectile dysfunction, unspecified: Secondary | ICD-10-CM | POA: Diagnosis not present

## 2016-02-02 DIAGNOSIS — Z6824 Body mass index (BMI) 24.0-24.9, adult: Secondary | ICD-10-CM | POA: Diagnosis not present

## 2016-02-09 DIAGNOSIS — R410 Disorientation, unspecified: Secondary | ICD-10-CM | POA: Diagnosis not present

## 2016-03-26 DIAGNOSIS — Z96651 Presence of right artificial knee joint: Secondary | ICD-10-CM | POA: Diagnosis not present

## 2016-03-26 DIAGNOSIS — M25562 Pain in left knee: Secondary | ICD-10-CM | POA: Diagnosis not present

## 2016-03-26 DIAGNOSIS — G8929 Other chronic pain: Secondary | ICD-10-CM | POA: Diagnosis not present

## 2016-03-26 DIAGNOSIS — M545 Low back pain: Secondary | ICD-10-CM | POA: Diagnosis not present

## 2016-03-26 DIAGNOSIS — M1712 Unilateral primary osteoarthritis, left knee: Secondary | ICD-10-CM | POA: Diagnosis not present

## 2016-04-05 DIAGNOSIS — Z79899 Other long term (current) drug therapy: Secondary | ICD-10-CM | POA: Diagnosis not present

## 2016-04-05 DIAGNOSIS — E7800 Pure hypercholesterolemia, unspecified: Secondary | ICD-10-CM | POA: Diagnosis not present

## 2016-04-11 DIAGNOSIS — R51 Headache: Secondary | ICD-10-CM | POA: Diagnosis not present

## 2016-04-26 DIAGNOSIS — G451 Carotid artery syndrome (hemispheric): Secondary | ICD-10-CM | POA: Diagnosis not present

## 2016-05-31 DIAGNOSIS — J069 Acute upper respiratory infection, unspecified: Secondary | ICD-10-CM | POA: Diagnosis not present

## 2016-05-31 DIAGNOSIS — Z87438 Personal history of other diseases of male genital organs: Secondary | ICD-10-CM | POA: Diagnosis not present

## 2016-05-31 DIAGNOSIS — Z8679 Personal history of other diseases of the circulatory system: Secondary | ICD-10-CM | POA: Diagnosis not present

## 2016-06-17 DIAGNOSIS — H6983 Other specified disorders of Eustachian tube, bilateral: Secondary | ICD-10-CM | POA: Diagnosis not present

## 2016-09-10 ENCOUNTER — Ambulatory Visit: Payer: Medicare Other | Admitting: Internal Medicine

## 2016-09-13 ENCOUNTER — Encounter: Payer: Self-pay | Admitting: Internal Medicine

## 2016-09-13 ENCOUNTER — Ambulatory Visit (INDEPENDENT_AMBULATORY_CARE_PROVIDER_SITE_OTHER): Payer: BC Managed Care – PPO | Admitting: Internal Medicine

## 2016-09-13 DIAGNOSIS — D649 Anemia, unspecified: Secondary | ICD-10-CM | POA: Diagnosis not present

## 2016-09-13 DIAGNOSIS — C61 Malignant neoplasm of prostate: Secondary | ICD-10-CM

## 2016-09-13 DIAGNOSIS — H3530 Unspecified macular degeneration: Secondary | ICD-10-CM | POA: Diagnosis not present

## 2016-09-13 DIAGNOSIS — G459 Transient cerebral ischemic attack, unspecified: Secondary | ICD-10-CM | POA: Diagnosis not present

## 2016-09-13 DIAGNOSIS — L989 Disorder of the skin and subcutaneous tissue, unspecified: Secondary | ICD-10-CM

## 2016-09-13 DIAGNOSIS — E7800 Pure hypercholesterolemia, unspecified: Secondary | ICD-10-CM | POA: Diagnosis not present

## 2016-09-13 MED ORDER — METOPROLOL TARTRATE 25 MG PO TABS: 25.0000 mg | ORAL_TABLET | Freq: Every morning | ORAL | 1 refills | Status: DC

## 2016-09-13 NOTE — Progress Notes (Signed)
Pre-visit discussion using our clinic review tool. No additional management support is needed unless otherwise documented below in the visit note.  

## 2016-09-13 NOTE — Progress Notes (Signed)
Patient ID: Todd Trujillo., male   DOB: 1930/03/10, 83 y.o.   MRN: [id number]   Subjective:    Patient ID: Todd Trujillo., male    DOB: 12/15/1929, 83 y.o.   MRN: [id number]  HPI  Patient here to establish care.  Former pt of Dr Doy Hutching.  Was admitted 01/08/16 and diagnosed with TIA.  Notes reviewed.  Had confusion.  MRI revealed no acute changes. CTA unremarkable.  ECHO - ok.  Blood pressure elevated.  He states prior to his ER admission, he fell out of a hammock and hit his head.  He is unsure if actually had TIA.  States may have been a concussion - per his opinion.  States since then he has done well.   He did see Dr Manuella Ghazi, who did agree with TIA diagnosis.  He did have EEG - ok.  Had f/u in 04/2016.  Felt to be stable.  No return of symptoms.  He is also followed by Dr Jacqlyn Larsen for prostate cancer.  Last seen 01/2016.  Recommended f/u in one year. Overall he feels things are relatively stable.  No chest pain.  No sob.  No acid reflux.  No nausea or vomiting.  Bowels moving.  Does have skin lesion on chest.  Request dermatology referral.      Past Medical History:  Diagnosis Date  . Arthritis   . Cancer [facility])    prostate  . Hyperlipidemia   . Hypertension   . Left wrist fracture   . Macular degeneration of right eye   . Nephrolithiasis   . Prostate CA ([place]) 2003  . Prostate troubles    Patient was unsure of term   Past Surgical History:  Procedure Laterality Date  . TOTAL KNEE ARTHROPLASTY     Family History  Problem Relation Age of Onset  . Colon cancer Mother    Social History   Social History  . Marital status: Married    Spouse name: N/A  . Number of children: N/A  . Years of education: N/A   Social History Main Topics  . Smoking status: Never Smoker  . Smokeless tobacco: Never Used  . Alcohol use No  . Drug use: Unknown  . Sexual activity: Not Asked   Other Topics Concern  . None   Social History Narrative  . None    Outpatient Encounter  Prescriptions as of 09/13/2016  Medication Sig  . metoprolol tartrate (LOPRESSOR) 25 MG tablet Take 1 tablet (25 mg total) by mouth every morning.  . tamsulosin (FLOMAX) 0.4 MG CAPS capsule Take 0.4 mg by mouth daily.  . [DISCONTINUED] metoprolol tartrate (LOPRESSOR) 25 MG tablet Take 1 tablet (25 mg total) by mouth every morning.  Marland Kitchen ibuprofen (GOODSENSE IBUPROFEN) 200 MG tablet Take 800 mg by mouth every 6 (six) hours as needed. For pain.  . [DISCONTINUED] atorvastatin (LIPITOR) 40 MG tablet Take 1 tablet (40 mg total) by mouth daily. (Patient not taking: Reported on 09/13/2016)  . [DISCONTINUED] HYDROcodone-acetaminophen (NORCO) 5-325 MG tablet Take 1-2 tablets by mouth every 4 (four) hours as needed for moderate pain. (Patient not taking: Reported on 01/09/2016)  . [DISCONTINUED] promethazine (PHENERGAN) 25 MG tablet Take 1 tablet (25 mg total) by mouth every 6 (six) hours as needed for nausea or vomiting. (Patient not taking: Reported on 01/09/2016)   No facility-administered encounter medications on file as of 09/13/2016.     Review of Systems  Constitutional: Negative for fever and unexpected weight change.  HENT:  Negative for congestion and sinus pressure.   Respiratory: Negative for cough, chest tightness and shortness of breath.   Cardiovascular: Negative for chest pain, palpitations and leg swelling.  Gastrointestinal: Negative for abdominal pain, diarrhea, nausea and vomiting.  Genitourinary: Negative for difficulty urinating and dysuria.  Musculoskeletal: Negative for back pain and joint swelling.  Skin: Negative for color change and rash.  Neurological: Negative for dizziness, light-headedness and headaches.  Psychiatric/Behavioral: Negative for agitation and dysphoric mood.       Objective:    Physical Exam  Constitutional: He appears well-developed and well-nourished. No distress.  HENT:  Nose: Nose normal.  Mouth/Throat: Oropharynx is clear and moist.  Neck: Neck supple. No  thyromegaly present.  Cardiovascular: Normal rate and regular rhythm.   Pulmonary/Chest: Effort normal and breath sounds normal. No respiratory distress.  Abdominal: Soft. Bowel sounds are normal. There is no tenderness.  Musculoskeletal: He exhibits no edema or tenderness.  Lymphadenopathy:    He has no cervical adenopathy.  Skin: No rash noted. No erythema.  Psychiatric: He has a normal mood and affect. His behavior is normal.    BP 138/64 (BP Location: Left Arm, Patient Position: Sitting, Cuff Size: Large)   Pulse 62   Temp 98.3 F (36.8 C) (Oral)   Resp 16   Ht 5\' 10"  (1.778 m)   Wt 176 lb 3.2 oz (79.9 kg)   SpO2 98%   BMI 25.28 kg/m  Wt Readings from Last 3 Encounters:  09/13/16 176 lb 3.2 oz (79.9 kg)  01/09/16 171 lb 9.6 oz (77.8 kg)  07/24/15 172 lb (78 kg)     Lab Results  Component Value Date   WBC 6.6 09/17/2016   HGB 14.3 09/17/2016   HCT 42.6 09/17/2016   PLT 215.0 09/17/2016   GLUCOSE 100 (H) 09/17/2016   CHOL 219 (H) 09/17/2016   TRIG 152.0 (H) 09/17/2016   HDL 43.80 09/17/2016   LDLCALC 144 (H) 09/17/2016   ALT 10 09/17/2016   AST 15 09/17/2016   NA 141 09/17/2016   K 4.7 09/17/2016   CL 107 09/17/2016   CREATININE 0.95 09/17/2016   BUN 12 09/17/2016   CO2 27 09/17/2016   TSH 2.73 09/17/2016   INR 1.34 01/08/2016    Mr Brain Wo Contrast  Result Date: 01/09/2016 CLINICAL DATA:  83 year old hypertensive male with headache and confusion. History prostate cancer. Subsequent encounter. EXAM: MRI HEAD WITHOUT CONTRAST TECHNIQUE: Multiplanar, multiecho pulse sequences of the brain and surrounding structures were obtained without intravenous contrast. COMPARISON:  CT and CT angiogram 01/08/2016.  No comparison MR. FINDINGS: No acute infarct or intracranial hemorrhage. Moderate chronic microvascular changes. Mild to moderate global atrophy without hydrocephalus. No intracranial mass lesion noted on this unenhanced exam. Major intracranial vascular  structures are patent. Left mastoid air cell opacification without obstructing lesion of the eustachian tube noted. Minimal to mild mucosal thickening ethmoid sinus air cells. Post lens replacement without acute orbital abnormality. Mild spinal stenosis C3-4 incompletely assessed on present exam. Mild transverse ligament hypertrophy. Small pituitary gland. IMPRESSION: No acute infarct or intracranial hemorrhage. Moderate chronic microvascular changes. Atrophy without hydrocephalus. Opacification left mastoid air cells. Electronically Signed   By: Genia Del M.D.   On: 01/09/2016 11:27       Assessment & Plan:   Problem List Items Addressed This Visit    Anemia    Has a documented h/o anemia.  Recheck cbc.        Relevant Orders   CBC with Differential/Platelet  Hypercholesterolemia    Not on a statin.  Low cholesterol diet and exercise.  Check lipid panel.        Relevant Medications   metoprolol tartrate (LOPRESSOR) 25 MG tablet   Other Relevant Orders   Hepatic function panel   Lipid panel   Basic metabolic panel   Macular degeneration    Followed by opthalmology.        Prostate cancer ([place])    Followed by Dr Jacqlyn Larsen.  Last evaluated 01/2016.  Recommended f/u in one year.        Relevant Orders   TSH   Skin lesion of chest wall    Request referral to dermatology for evaluation.  Wants to see Dr Shelda Jakes.        Relevant Orders   Ambulatory referral to Dermatology   TIA (transient ischemic attack)    Admitted in 12/2015 with presumed TIA.  Had extensive w/up as outlined. Not on aspirin secondary to macular degeneration.  Declines.  No reoccurring symptoms.  Follow.        Relevant Medications   metoprolol tartrate (LOPRESSOR) 25 MG tablet     I spent 45 minutes with the patient and more than 50% of the time was spent in consultation regarding the above.  Time spent obtaining history and discussion of current concerns.  Time also spent discussing plan for treatment and  for f/u.     Einar Pheasant, MD

## 2016-09-14 ENCOUNTER — Telehealth: Payer: Self-pay | Admitting: Radiology

## 2016-09-14 NOTE — Telephone Encounter (Signed)
PT coming in for labs on Monday, please place future orders. Thank you.  

## 2016-09-17 ENCOUNTER — Other Ambulatory Visit: Payer: Self-pay | Admitting: Internal Medicine

## 2016-09-17 ENCOUNTER — Other Ambulatory Visit (INDEPENDENT_AMBULATORY_CARE_PROVIDER_SITE_OTHER): Payer: BC Managed Care – PPO

## 2016-09-17 DIAGNOSIS — D649 Anemia, unspecified: Secondary | ICD-10-CM | POA: Insufficient documentation

## 2016-09-17 DIAGNOSIS — E7800 Pure hypercholesterolemia, unspecified: Secondary | ICD-10-CM

## 2016-09-17 LAB — LIPID PANEL
Cholesterol: 219 mg/dL — ABNORMAL HIGH (ref 0–200)
HDL: 43.8 mg/dL (ref >39.00)
LDL Cholesterol: 144 mg/dL — ABNORMAL HIGH (ref 0–99)
NONHDL: 174.7
Total CHOL/HDL Ratio: 5
Triglycerides: 152 mg/dL — ABNORMAL HIGH (ref 0.0–149.0)
VLDL: 30.4 mg/dL (ref 0.0–40.0)

## 2016-09-17 LAB — HEPATIC FUNCTION PANEL
ALBUMIN: 3.9 g/dL (ref 3.5–5.2)
ALT: 10 U/L (ref 0–53)
AST: 15 U/L (ref 0–37)
Alkaline Phosphatase: 62 U/L (ref 39–117)
Bilirubin, Direct: 0.1 mg/dL (ref 0.0–0.3)
TOTAL PROTEIN: 6.1 g/dL (ref 6.0–8.3)
Total Bilirubin: 0.8 mg/dL (ref 0.2–1.2)

## 2016-09-17 LAB — CBC WITH DIFFERENTIAL/PLATELET
BASOS ABS: 0.1 10*3/uL (ref 0.0–0.1)
Basophils Relative: 1.3 % (ref 0.0–3.0)
Eosinophils Absolute: 0.2 10*3/uL (ref 0.0–0.7)
Eosinophils Relative: 3 % (ref 0.0–5.0)
HCT: 42.6 % (ref 39.0–52.0)
HEMOGLOBIN: 14.3 g/dL (ref 13.0–17.0)
LYMPHS ABS: 2.4 10*3/uL (ref 0.7–4.0)
Lymphocytes Relative: 36.8 % (ref 12.0–46.0)
MCHC: 33.5 g/dL (ref 30.0–36.0)
MCV: 96.1 fl (ref 78.0–100.0)
Monocytes Absolute: 0.7 10*3/uL (ref 0.1–1.0)
Monocytes Relative: 10.4 % (ref 3.0–12.0)
NEUTROS PCT: 48.5 % (ref 43.0–77.0)
Neutro Abs: 3.2 10*3/uL (ref 1.4–7.7)
Platelets: 215 10*3/uL (ref 150.0–400.0)
RBC: 4.44 Mil/uL (ref 4.22–5.81)
RDW: 13.9 % (ref 11.5–15.5)
WBC: 6.6 10*3/uL (ref 4.0–10.5)

## 2016-09-17 LAB — BASIC METABOLIC PANEL
BUN: 12 mg/dL (ref 6–23)
CHLORIDE: 107 meq/L (ref 96–112)
CO2: 27 meq/L (ref 19–32)
CREATININE: 0.95 mg/dL (ref 0.40–1.50)
Calcium: 9.2 mg/dL (ref 8.4–10.5)
GFR: 80.18 mL/min (ref >60.00)
Glucose, Bld: 100 mg/dL — ABNORMAL HIGH (ref 70–99)
Potassium: 4.7 mEq/L (ref 3.5–5.1)
SODIUM: 141 meq/L (ref 135–145)

## 2016-09-17 LAB — IBC PANEL
Iron: 94 ug/dL (ref 42–165)
Saturation Ratios: 30.8 % (ref 20.0–50.0)
TRANSFERRIN: 218 mg/dL (ref 212.0–360.0)

## 2016-09-17 LAB — TSH: TSH: 2.73 u[IU]/mL (ref 0.35–4.50)

## 2016-09-17 LAB — FERRITIN: Ferritin: 37 ng/mL (ref 22.0–322.0)

## 2016-09-17 NOTE — Progress Notes (Signed)
Orders placed for labs

## 2016-09-17 NOTE — Telephone Encounter (Signed)
Orders placed for labs

## 2016-09-18 ENCOUNTER — Encounter: Payer: Self-pay | Admitting: Internal Medicine

## 2016-09-20 NOTE — Telephone Encounter (Signed)
Copy mailed to pt. 

## 2016-09-23 ENCOUNTER — Encounter: Payer: Self-pay | Admitting: Internal Medicine

## 2016-09-23 DIAGNOSIS — L989 Disorder of the skin and subcutaneous tissue, unspecified: Secondary | ICD-10-CM | POA: Insufficient documentation

## 2016-09-23 NOTE — Assessment & Plan Note (Signed)
Has a documented h/o anemia.  Recheck cbc.

## 2016-09-23 NOTE — Assessment & Plan Note (Signed)
Followed by Dr Jacqlyn Larsen.  Last evaluated 01/2016.  Recommended f/u in one year.

## 2016-09-23 NOTE — Assessment & Plan Note (Signed)
Not on a statin.  Low cholesterol diet and exercise.  Check lipid panel.

## 2016-09-23 NOTE — Assessment & Plan Note (Signed)
Followed by opthalmology.       

## 2016-09-23 NOTE — Assessment & Plan Note (Signed)
Admitted in 12/2015 with presumed TIA.  Had extensive w/up as outlined. Not on aspirin secondary to macular degeneration.  Declines.  No reoccurring symptoms.  Follow.

## 2016-09-23 NOTE — Assessment & Plan Note (Signed)
Request referral to dermatology for evaluation.  Wants to see Dr Shelda Jakes.

## 2016-09-28 ENCOUNTER — Telehealth: Payer: Self-pay | Admitting: Internal Medicine

## 2016-09-28 NOTE — Telephone Encounter (Signed)
I spoke to [place] at [facility] Dr Manley Mason office she stated before they can schedule appt for the referral they would need a picture of the site on pt chest. Once that's taken the pic would need to be mailed to: [employer] [address] [place] [address]. [phone] Let me know once that's done so the referral and path can be sent with it.  Thank you!

## 2016-09-28 NOTE — Telephone Encounter (Signed)
Notify pt that dermatology is requesting a picture of the lesion prior to scheduling an appt.  If he wants to see Dr Manley Mason, we will need him to take a picture and can send through his my chart, or can bring in and we can forward to Dr Merritt's office.

## 2016-09-28 NOTE — Telephone Encounter (Signed)
Please advise 

## 2016-10-01 NOTE — Telephone Encounter (Signed)
Left message with wife to have him call office.

## 2016-10-02 NOTE — Telephone Encounter (Signed)
Pt called back returning your call. Thank you!  Call pt @ [phone]

## 2016-10-02 NOTE — Telephone Encounter (Signed)
Patient wants to be referred to Dr Nehemiah Massed in [place] .  Patient wants to cancel referral to Dr Manley Mason . Patient states he will have [place] insurance until end of April hope to get appointment before then .   Thanks.

## 2016-10-03 NOTE — Telephone Encounter (Signed)
You do not need to place another referral for him to see Dr. Nehemiah Massed. Is this something that he needs to be seen soon for? If so, Dr. Alveria Apley new patients are booked out months.

## 2016-10-03 NOTE — Telephone Encounter (Signed)
It is not an urgent appt, but if they have anything before months out - would prefer.  Just let pt know may be a while to get appt.

## 2016-10-03 NOTE — Telephone Encounter (Signed)
Todd Trujillo, I had placed an order for referral to dermatology (for Dr Manley Mason).  See attached phone messages.  Was informed would need picture to send to Dr Manley Mason before scheduling appt.  Todd Trujillo sent message stating wanted to see Dr Nehemiah Massed.  Do you need me to place another order?  Just let me know what I need to do.

## 2016-10-05 ENCOUNTER — Encounter: Payer: Self-pay | Admitting: Internal Medicine

## 2016-10-10 DIAGNOSIS — L821 Other seborrheic keratosis: Secondary | ICD-10-CM | POA: Diagnosis not present

## 2016-10-10 DIAGNOSIS — Z85828 Personal history of other malignant neoplasm of skin: Secondary | ICD-10-CM | POA: Diagnosis not present

## 2016-10-10 DIAGNOSIS — L570 Actinic keratosis: Secondary | ICD-10-CM | POA: Diagnosis not present

## 2016-10-10 DIAGNOSIS — L988 Other specified disorders of the skin and subcutaneous tissue: Secondary | ICD-10-CM | POA: Diagnosis not present

## 2016-10-10 DIAGNOSIS — D485 Neoplasm of uncertain behavior of skin: Secondary | ICD-10-CM | POA: Diagnosis not present

## 2016-12-14 ENCOUNTER — Encounter: Payer: Self-pay | Admitting: Internal Medicine

## 2016-12-14 ENCOUNTER — Ambulatory Visit (INDEPENDENT_AMBULATORY_CARE_PROVIDER_SITE_OTHER): Payer: Medicare Other | Admitting: Internal Medicine

## 2016-12-14 DIAGNOSIS — Z0000 Encounter for general adult medical examination without abnormal findings: Secondary | ICD-10-CM | POA: Insufficient documentation

## 2016-12-14 DIAGNOSIS — D649 Anemia, unspecified: Secondary | ICD-10-CM

## 2016-12-14 DIAGNOSIS — C61 Malignant neoplasm of prostate: Secondary | ICD-10-CM

## 2016-12-14 DIAGNOSIS — R634 Abnormal weight loss: Secondary | ICD-10-CM | POA: Diagnosis not present

## 2016-12-14 DIAGNOSIS — H3530 Unspecified macular degeneration: Secondary | ICD-10-CM | POA: Diagnosis not present

## 2016-12-14 DIAGNOSIS — E7800 Pure hypercholesterolemia, unspecified: Secondary | ICD-10-CM

## 2016-12-14 DIAGNOSIS — G459 Transient cerebral ischemic attack, unspecified: Secondary | ICD-10-CM

## 2016-12-14 LAB — CBC WITH DIFFERENTIAL/PLATELET
BASOS ABS: 0.1 10*3/uL (ref 0.0–0.1)
BASOS PCT: 1 % (ref 0.0–3.0)
EOS ABS: 0.2 10*3/uL (ref 0.0–0.7)
Eosinophils Relative: 2.3 % (ref 0.0–5.0)
HCT: 41.4 % (ref 39.0–52.0)
Hemoglobin: 13.8 g/dL (ref 13.0–17.0)
LYMPHS ABS: 2.6 10*3/uL (ref 0.7–4.0)
LYMPHS PCT: 36.7 % (ref 12.0–46.0)
MCHC: 33.3 g/dL (ref 30.0–36.0)
MCV: 94.9 fl (ref 78.0–100.0)
Monocytes Absolute: 0.7 10*3/uL (ref 0.1–1.0)
Monocytes Relative: 10.2 % (ref 3.0–12.0)
NEUTROS ABS: 3.5 10*3/uL (ref 1.4–7.7)
NEUTROS PCT: 49.8 % (ref 43.0–77.0)
PLATELETS: 195 10*3/uL (ref 150.0–400.0)
RBC: 4.36 Mil/uL (ref 4.22–5.81)
RDW: 13.7 % (ref 11.5–15.5)
WBC: 7 10*3/uL (ref 4.0–10.5)

## 2016-12-14 LAB — HEPATIC FUNCTION PANEL
ALK PHOS: 65 U/L (ref 39–117)
ALT: 9 U/L (ref 0–53)
AST: 16 U/L (ref 0–37)
Albumin: 4.2 g/dL (ref 3.5–5.2)
BILIRUBIN DIRECT: 0.2 mg/dL (ref 0.0–0.3)
BILIRUBIN TOTAL: 0.9 mg/dL (ref 0.2–1.2)
TOTAL PROTEIN: 6.5 g/dL (ref 6.0–8.3)

## 2016-12-14 LAB — BASIC METABOLIC PANEL
BUN: 21 mg/dL (ref 6–23)
CHLORIDE: 105 meq/L (ref 96–112)
CO2: 29 meq/L (ref 19–32)
CREATININE: 1.05 mg/dL (ref 0.40–1.50)
Calcium: 9.4 mg/dL (ref 8.4–10.5)
GFR: 71.39 mL/min (ref >60.00)
GLUCOSE: 100 mg/dL — AB (ref 70–99)
POTASSIUM: 4.5 meq/L (ref 3.5–5.1)
Sodium: 139 mEq/L (ref 135–145)

## 2016-12-14 NOTE — Assessment & Plan Note (Signed)
Physical today 12/14/16.  Followed by Dr Jacqlyn Larsen for prostate cancer.

## 2016-12-14 NOTE — Progress Notes (Signed)
Patient ID: Todd Trujillo., male   DOB: 11-10-1929, 83 y.o.   MRN: [id number]   Subjective:    Patient ID: Todd Trujillo., male    DOB: 10-15-1929, 83 y.o.   MRN: [id number]  HPI  Patient with past history of hypercholesterolemia, hypertension and prostate cancer.  He comes in today to follow up on these issues as well as for a complete physical exam.  Admitted 12/2015 for TIA.  Followed by Dr Jacqlyn Larsen for prostate cancer.  States he is doing relatively well.  Stays active.  No chest pain.  No sob.  No acid reflux.  No abdominal pain.  Bowels moving.  States he does not eat as much as he used to.  Weight stable over the last year.  Some loss from last visit.  Still eats regular meals.  Overall he feels he is doing relatively well.     Past Medical History:  Diagnosis Date  . Arthritis   . Cancer [facility] [place])    prostate  . Hyperlipidemia   . Hypertension   . Left wrist fracture   . Macular degeneration of right eye   . Nephrolithiasis   . Prostate CA ([place]) 2003  . Prostate troubles    Patient was unsure of term   Past Surgical History:  Procedure Laterality Date  . TOTAL KNEE ARTHROPLASTY     Family History  Problem Relation Age of Onset  . Colon cancer Mother    Social History   Social History  . Marital status: Married    Spouse name: N/A  . Number of children: N/A  . Years of education: N/A   Social History Main Topics  . Smoking status: Never Smoker  . Smokeless tobacco: Never Used  . Alcohol use No  . Drug use: Unknown  . Sexual activity: Not Asked   Other Topics Concern  . None   Social History Narrative  . None    Outpatient Encounter Prescriptions as of 12/14/2016  Medication Sig  . ibuprofen (GOODSENSE IBUPROFEN) 200 MG tablet Take 800 mg by mouth every 6 (six) hours as needed. For pain.  . metoprolol tartrate (LOPRESSOR) 25 MG tablet Take 1 tablet (25 mg total) by mouth every morning.  . tamsulosin (FLOMAX) 0.4 MG CAPS capsule Take 0.4 mg  by mouth daily.   No facility-administered encounter medications on file as of 12/14/2016.     Review of Systems  Constitutional: Negative for fatigue.       Some decreased po intake, but he states he just does not eat as much as he used to.    HENT: Negative for congestion and sinus pressure.   Respiratory: Negative for cough, chest tightness and shortness of breath.   Cardiovascular: Negative for chest pain, palpitations and leg swelling.  Gastrointestinal: Negative for abdominal pain, diarrhea, nausea and vomiting.  Genitourinary: Negative for difficulty urinating and dysuria.  Musculoskeletal: Negative for back pain and myalgias.  Skin: Negative for color change and rash.  Neurological: Negative for dizziness, light-headedness and headaches.  Psychiatric/Behavioral: Negative for agitation and dysphoric mood.       Objective:    Physical Exam  Constitutional: He is oriented to person, place, and time. He appears well-developed and well-nourished. No distress.  HENT:  Head: Normocephalic and atraumatic.  Nose: Nose normal.  Mouth/Throat: Oropharynx is clear and moist. No oropharyngeal exudate.  Eyes: Conjunctivae are normal. Right eye exhibits no discharge. Left eye exhibits no discharge.  Neck: Neck supple. No thyromegaly present.  Cardiovascular: Normal rate and regular rhythm.   Pulmonary/Chest: Breath sounds normal. No respiratory distress. He has no wheezes.  Abdominal: Soft. Bowel sounds are normal. There is no tenderness.  Genitourinary:  Genitourinary Comments: Followed by urology  Musculoskeletal: He exhibits no edema or tenderness.  Lymphadenopathy:    He has no cervical adenopathy.  Neurological: He is alert and oriented to person, place, and time.  Skin: Skin is warm and dry. No rash noted. No erythema.  Psychiatric: He has a normal mood and affect. His behavior is normal.    BP 130/60 (BP Location: Left Arm, Patient Position: Sitting, Cuff Size: Normal)    Pulse 70   Temp 98.1 F (36.7 C) (Oral)   Resp 12   Ht 5\' 10"  (1.778 m)   Wt 170 lb 9.6 oz (77.4 kg)   SpO2 98%   BMI 24.48 kg/m  Wt Readings from Last 3 Encounters:  12/14/16 170 lb 9.6 oz (77.4 kg)  09/13/16 176 lb 3.2 oz (79.9 kg)  01/09/16 171 lb 9.6 oz (77.8 kg)     Lab Results  Component Value Date   WBC 7.0 12/14/2016   HGB 13.8 12/14/2016   HCT 41.4 12/14/2016   PLT 195.0 12/14/2016   GLUCOSE 100 (H) 12/14/2016   CHOL 219 (H) 09/17/2016   TRIG 152.0 (H) 09/17/2016   HDL 43.80 09/17/2016   LDLCALC 144 (H) 09/17/2016   ALT 9 12/14/2016   AST 16 12/14/2016   NA 139 12/14/2016   K 4.5 12/14/2016   CL 105 12/14/2016   CREATININE 1.05 12/14/2016   BUN 21 12/14/2016   CO2 29 12/14/2016   TSH 2.73 09/17/2016   INR 1.34 01/08/2016    Mr Brain Wo Contrast  Result Date: 01/09/2016 CLINICAL DATA:  83 year old hypertensive male with headache and confusion. History prostate cancer. Subsequent encounter. EXAM: MRI HEAD WITHOUT CONTRAST TECHNIQUE: Multiplanar, multiecho pulse sequences of the brain and surrounding structures were obtained without intravenous contrast. COMPARISON:  CT and CT angiogram 01/08/2016.  No comparison MR. FINDINGS: No acute infarct or intracranial hemorrhage. Moderate chronic microvascular changes. Mild to moderate global atrophy without hydrocephalus. No intracranial mass lesion noted on this unenhanced exam. Major intracranial vascular structures are patent. Left mastoid air cell opacification without obstructing lesion of the eustachian tube noted. Minimal to mild mucosal thickening ethmoid sinus air cells. Post lens replacement without acute orbital abnormality. Mild spinal stenosis C3-4 incompletely assessed on present exam. Mild transverse ligament hypertrophy. Small pituitary gland. IMPRESSION: No acute infarct or intracranial hemorrhage. Moderate chronic microvascular changes. Atrophy without hydrocephalus. Opacification left mastoid air cells.  Electronically Signed   By: Genia Del M.D.   On: 01/09/2016 11:27       Assessment & Plan:   Problem List Items Addressed This Visit    Anemia    Has a documented history of anemia.  Recheck cbc.       Healthcare maintenance    Physical today 12/14/16.  Followed by Dr Jacqlyn Larsen for prostate cancer.        Hypercholesterolemia    Low cholesterol diet and exercise.  Follow lipid panel.        Macular degeneration    Followed by opthalmology.        Prostate cancer ([place])    Followed by Dr Jacqlyn Larsen.  Stable.  Last evaluated 01/2016.  Recommended f/u in one year.        TIA (transient ischemic attack)    Admitted 12/2015 with presumed TIA.  Had  extensive w/up as outlined previously.  No reoccurring symptoms.  Follow.        Weight loss    Some weigh loss from last check.  Stable from the previous check.  Eating.  States does not eat as much.  No nausea or vomiting.  Bowels moving.  Follow weight.            Einar Pheasant, MD

## 2016-12-15 ENCOUNTER — Encounter: Payer: Self-pay | Admitting: Internal Medicine

## 2016-12-15 DIAGNOSIS — R634 Abnormal weight loss: Secondary | ICD-10-CM | POA: Insufficient documentation

## 2016-12-15 NOTE — Assessment & Plan Note (Signed)
Has a documented history of anemia.  Recheck cbc.

## 2016-12-15 NOTE — Assessment & Plan Note (Signed)
Admitted 12/2015 with presumed TIA.  Had extensive w/up as outlined previously.  No reoccurring symptoms.  Follow.

## 2016-12-15 NOTE — Assessment & Plan Note (Signed)
Some weigh loss from last check.  Stable from the previous check.  Eating.  States does not eat as much.  No nausea or vomiting.  Bowels moving.  Follow weight.

## 2016-12-15 NOTE — Assessment & Plan Note (Signed)
Followed by opthalmology.       

## 2016-12-15 NOTE — Assessment & Plan Note (Signed)
Followed by Dr Jacqlyn Larsen.  Stable.  Last evaluated 01/2016.  Recommended f/u in one year.

## 2016-12-15 NOTE — Assessment & Plan Note (Signed)
Low cholesterol diet and exercise.  Follow lipid panel.   

## 2016-12-19 NOTE — Telephone Encounter (Signed)
Hard copy mailed  

## 2017-01-23 DIAGNOSIS — L578 Other skin changes due to chronic exposure to nonionizing radiation: Secondary | ICD-10-CM | POA: Diagnosis not present

## 2017-01-23 DIAGNOSIS — L905 Scar conditions and fibrosis of skin: Secondary | ICD-10-CM | POA: Diagnosis not present

## 2017-01-23 DIAGNOSIS — L812 Freckles: Secondary | ICD-10-CM | POA: Diagnosis not present

## 2017-01-23 DIAGNOSIS — D229 Melanocytic nevi, unspecified: Secondary | ICD-10-CM | POA: Diagnosis not present

## 2017-01-23 DIAGNOSIS — D485 Neoplasm of uncertain behavior of skin: Secondary | ICD-10-CM | POA: Diagnosis not present

## 2017-01-23 DIAGNOSIS — L820 Inflamed seborrheic keratosis: Secondary | ICD-10-CM | POA: Diagnosis not present

## 2017-01-23 DIAGNOSIS — C44622 Squamous cell carcinoma of skin of right upper limb, including shoulder: Secondary | ICD-10-CM | POA: Diagnosis not present

## 2017-01-23 DIAGNOSIS — L821 Other seborrheic keratosis: Secondary | ICD-10-CM | POA: Diagnosis not present

## 2017-01-23 DIAGNOSIS — C4492 Squamous cell carcinoma of skin, unspecified: Secondary | ICD-10-CM

## 2017-01-23 HISTORY — DX: Squamous cell carcinoma of skin, unspecified: C44.92

## 2017-02-18 ENCOUNTER — Encounter: Payer: Self-pay | Admitting: Internal Medicine

## 2017-02-18 ENCOUNTER — Ambulatory Visit (INDEPENDENT_AMBULATORY_CARE_PROVIDER_SITE_OTHER): Payer: Medicare Other | Admitting: Internal Medicine

## 2017-02-18 DIAGNOSIS — G459 Transient cerebral ischemic attack, unspecified: Secondary | ICD-10-CM

## 2017-02-18 DIAGNOSIS — C61 Malignant neoplasm of prostate: Secondary | ICD-10-CM | POA: Diagnosis not present

## 2017-02-18 DIAGNOSIS — E7800 Pure hypercholesterolemia, unspecified: Secondary | ICD-10-CM

## 2017-02-18 DIAGNOSIS — R634 Abnormal weight loss: Secondary | ICD-10-CM | POA: Diagnosis not present

## 2017-02-18 MED ORDER — METOPROLOL TARTRATE 25 MG PO TABS: 25.0000 mg | ORAL_TABLET | Freq: Every morning | ORAL | 1 refills | Status: DC

## 2017-02-18 MED ORDER — TRIAMCINOLONE ACETONIDE 0.1 % EX CREA: 1.0000 "application " | TOPICAL_CREAM | Freq: Two times a day (BID) | CUTANEOUS | 0 refills | Status: DC

## 2017-02-18 NOTE — Progress Notes (Signed)
Patient ID: Todd Trujillo., male   DOB: September 14, 1929, 83 y.o.   MRN: [id number]   Subjective:    Patient ID: Todd Trujillo., male    DOB: 12/01/1929, 83 y.o.   MRN: [id number]  HPI  Patient here for a scheduled follow up.  States he is doing well.  Stays active.  Removed a flower yesterday and had multiple ants on his hands.  He immediately wiped them off.  Noticed some increased itching and minimal swelling overnight.  Still with some itching.  No lip or tongue swelling.  No sob.  States he is doing well otherwise.  No chest pain.  No acid reflux.  No abdominal pain.  Bowels moving.  Has some skin lesions on his face.  Concerned because persistent.  Sees Dr Nehemiah Massed.     Past Medical History:  Diagnosis Date  . Arthritis   . Cancer [facility])    prostate  . Hyperlipidemia   . Hypertension   . Left wrist fracture   . Macular degeneration of right eye   . Nephrolithiasis   . Prostate CA ([place]) 2003  . Prostate troubles    Patient was unsure of term   Past Surgical History:  Procedure Laterality Date  . TOTAL KNEE ARTHROPLASTY     Family History  Problem Relation Age of Onset  . Colon cancer Mother    Social History   Social History  . Marital status: Married    Spouse name: N/A  . Number of children: N/A  . Years of education: N/A   Social History Main Topics  . Smoking status: Never Smoker  . Smokeless tobacco: Never Used  . Alcohol use No  . Drug use: Unknown  . Sexual activity: Not Asked   Other Topics Concern  . None   Social History Narrative  . None    Outpatient Encounter Prescriptions as of 02/18/2017  Medication Sig  . ibuprofen (GOODSENSE IBUPROFEN) 200 MG tablet Take 800 mg by mouth every 6 (six) hours as needed. For pain.  . metoprolol tartrate (LOPRESSOR) 25 MG tablet Take 1 tablet (25 mg total) by mouth every morning.  . tamsulosin (FLOMAX) 0.4 MG CAPS capsule Take 0.4 mg by mouth daily.  . [DISCONTINUED] metoprolol tartrate  (LOPRESSOR) 25 MG tablet Take 1 tablet (25 mg total) by mouth every morning.  . triamcinolone cream (KENALOG) 0.1 % Apply 1 application topically 2 (two) times daily.   No facility-administered encounter medications on file as of 02/18/2017.     Review of Systems  Constitutional: Negative for appetite change and unexpected weight change.  HENT: Negative for congestion and sinus pressure.   Respiratory: Negative for cough, chest tightness and shortness of breath.   Cardiovascular: Negative for chest pain, palpitations and leg swelling.  Gastrointestinal: Negative for abdominal pain, diarrhea, nausea and vomiting.  Musculoskeletal: Negative for back pain and joint swelling.       Right leg bigger than left - unchanged.  No swelling.  States has been present since remote injury.   Skin: Negative for wound.       Minimal swelling - right hand.  No significant erythema.    Neurological: Negative for dizziness, light-headedness and headaches.  Psychiatric/Behavioral: Negative for agitation and dysphoric mood.       Objective:    Physical Exam  Constitutional: He appears well-developed and well-nourished. No distress.  HENT:  Nose: Nose normal.  Mouth/Throat: Oropharynx is clear and moist.  Neck: Neck supple. No thyromegaly present.  Cardiovascular: Normal rate and regular rhythm.   Pulmonary/Chest: Effort normal and breath sounds normal. No respiratory distress.  Abdominal: Soft. Bowel sounds are normal. There is no tenderness.  Musculoskeletal: He exhibits no edema or tenderness.  Right leg larger than left - chronic- unchanged.  No increased edema.   Lymphadenopathy:    He has no cervical adenopathy.  Skin: No erythema.  Minima swelling right hand.  No significant erythema.    Psychiatric: He has a normal mood and affect. His behavior is normal.    BP 128/64 (BP Location: Left Arm, Patient Position: Sitting, Cuff Size: Normal)   Pulse 60   Temp 98.6 F (37 C) (Oral)   Resp 12    Ht 5\' 10"  (1.778 m)   Wt 170 lb 3.2 oz (77.2 kg)   SpO2 97%   BMI 24.42 kg/m  Wt Readings from Last 3 Encounters:  02/18/17 170 lb 3.2 oz (77.2 kg)  12/14/16 170 lb 9.6 oz (77.4 kg)  09/13/16 176 lb 3.2 oz (79.9 kg)     Lab Results  Component Value Date   WBC 7.0 12/14/2016   HGB 13.8 12/14/2016   HCT 41.4 12/14/2016   PLT 195.0 12/14/2016   GLUCOSE 100 (H) 12/14/2016   CHOL 219 (H) 09/17/2016   TRIG 152.0 (H) 09/17/2016   HDL 43.80 09/17/2016   LDLCALC 144 (H) 09/17/2016   ALT 9 12/14/2016   AST 16 12/14/2016   NA 139 12/14/2016   K 4.5 12/14/2016   CL 105 12/14/2016   CREATININE 1.05 12/14/2016   BUN 21 12/14/2016   CO2 29 12/14/2016   TSH 2.73 09/17/2016   INR 1.34 01/08/2016    Mr Brain Wo Contrast  Result Date: 01/09/2016 CLINICAL DATA:  83 year old hypertensive male with headache and confusion. History prostate cancer. Subsequent encounter. EXAM: MRI HEAD WITHOUT CONTRAST TECHNIQUE: Multiplanar, multiecho pulse sequences of the brain and surrounding structures were obtained without intravenous contrast. COMPARISON:  CT and CT angiogram 01/08/2016.  No comparison MR. FINDINGS: No acute infarct or intracranial hemorrhage. Moderate chronic microvascular changes. Mild to moderate global atrophy without hydrocephalus. No intracranial mass lesion noted on this unenhanced exam. Major intracranial vascular structures are patent. Left mastoid air cell opacification without obstructing lesion of the eustachian tube noted. Minimal to mild mucosal thickening ethmoid sinus air cells. Post lens replacement without acute orbital abnormality. Mild spinal stenosis C3-4 incompletely assessed on present exam. Mild transverse ligament hypertrophy. Small pituitary gland. IMPRESSION: No acute infarct or intracranial hemorrhage. Moderate chronic microvascular changes. Atrophy without hydrocephalus. Opacification left mastoid air cells. Electronically Signed   By: Genia Del M.D.   On:  01/09/2016 11:27       Assessment & Plan:   Problem List Items Addressed This Visit    Hypercholesterolemia    Low cholesterol diet and exercise.  Recheck liver panel.  Pending results, will discuss starting statin medication.       Relevant Medications   metoprolol tartrate (LOPRESSOR) 25 MG tablet   Other Relevant Orders   Lipid panel   Comprehensive metabolic panel   Prostate cancer ([place])    Followed by Dr Jacqlyn Larsen.  Stable.  Has f/u planned next month.       TIA (transient ischemic attack)    Admitted 12/2015 with presumed TIA.  Had extensive w/up as outlined previously.  MRI as outlined.  No reoccurring symptoms.  Per note, cannot tolerate antiplatelet therapy.        Relevant Medications   metoprolol tartrate (  LOPRESSOR) 25 MG tablet   Weight loss    Weight is stable from the previous check.  Eating well.  Feels good.           Einar Pheasant, MD

## 2017-02-18 NOTE — Assessment & Plan Note (Addendum)
Admitted 12/2015 with presumed TIA.  Had extensive w/up as outlined previously.  MRI as outlined.  No reoccurring symptoms.  Per note, cannot tolerate antiplatelet therapy.

## 2017-02-18 NOTE — Assessment & Plan Note (Signed)
Weight is stable from the previous check.  Eating well.  Feels good.

## 2017-02-18 NOTE — Assessment & Plan Note (Signed)
Followed by Dr Jacqlyn Larsen.  Stable.  Has f/u planned next month.

## 2017-02-18 NOTE — Progress Notes (Signed)
Pre-visit discussion using our clinic review tool. No additional management support is needed unless otherwise documented below in the visit note.  

## 2017-02-18 NOTE — Assessment & Plan Note (Signed)
Low cholesterol diet and exercise.  Recheck liver panel.  Pending results, will discuss starting statin medication.

## 2017-03-04 DIAGNOSIS — C61 Malignant neoplasm of prostate: Secondary | ICD-10-CM | POA: Diagnosis not present

## 2017-03-04 DIAGNOSIS — N320 Bladder-neck obstruction: Secondary | ICD-10-CM | POA: Diagnosis not present

## 2017-03-04 DIAGNOSIS — N281 Cyst of kidney, acquired: Secondary | ICD-10-CM | POA: Diagnosis not present

## 2017-03-04 DIAGNOSIS — R339 Retention of urine, unspecified: Secondary | ICD-10-CM | POA: Diagnosis not present

## 2017-03-22 ENCOUNTER — Other Ambulatory Visit (INDEPENDENT_AMBULATORY_CARE_PROVIDER_SITE_OTHER): Payer: Medicare Other

## 2017-03-22 DIAGNOSIS — E7800 Pure hypercholesterolemia, unspecified: Secondary | ICD-10-CM | POA: Diagnosis not present

## 2017-03-22 DIAGNOSIS — C61 Malignant neoplasm of prostate: Secondary | ICD-10-CM

## 2017-03-22 LAB — COMPREHENSIVE METABOLIC PANEL
ALK PHOS: 59 U/L (ref 39–117)
ALT: 9 U/L (ref 0–53)
AST: 15 U/L (ref 0–37)
Albumin: 3.8 g/dL (ref 3.5–5.2)
BUN: 15 mg/dL (ref 6–23)
CO2: 29 meq/L (ref 19–32)
Calcium: 9.1 mg/dL (ref 8.4–10.5)
Chloride: 106 mEq/L (ref 96–112)
Creatinine, Ser: 0.95 mg/dL (ref 0.40–1.50)
GFR: 80.08 mL/min (ref >60.00)
GLUCOSE: 104 mg/dL — AB (ref 70–99)
POTASSIUM: 4.5 meq/L (ref 3.5–5.1)
Sodium: 139 mEq/L (ref 135–145)
TOTAL PROTEIN: 6.1 g/dL (ref 6.0–8.3)
Total Bilirubin: 0.6 mg/dL (ref 0.2–1.2)

## 2017-03-22 LAB — TSH: TSH: 1.92 u[IU]/mL (ref 0.35–4.50)

## 2017-03-22 LAB — LIPID PANEL
CHOL/HDL RATIO: 4
Cholesterol: 195 mg/dL (ref 0–200)
HDL: 44.1 mg/dL (ref >39.00)
LDL Cholesterol: 133 mg/dL — ABNORMAL HIGH (ref 0–99)
NONHDL: 150.59
Triglycerides: 88 mg/dL (ref 0.0–149.0)
VLDL: 17.6 mg/dL (ref 0.0–40.0)

## 2017-06-11 ENCOUNTER — Other Ambulatory Visit: Payer: Self-pay | Admitting: Internal Medicine

## 2017-06-18 DIAGNOSIS — Z85828 Personal history of other malignant neoplasm of skin: Secondary | ICD-10-CM | POA: Diagnosis not present

## 2017-06-18 DIAGNOSIS — L821 Other seborrheic keratosis: Secondary | ICD-10-CM | POA: Diagnosis not present

## 2017-06-20 ENCOUNTER — Ambulatory Visit: Payer: Medicare Other | Admitting: Internal Medicine

## 2017-07-01 DIAGNOSIS — H900 Conductive hearing loss, bilateral: Secondary | ICD-10-CM | POA: Diagnosis not present

## 2017-07-01 DIAGNOSIS — H6123 Impacted cerumen, bilateral: Secondary | ICD-10-CM | POA: Diagnosis not present

## 2017-08-08 ENCOUNTER — Ambulatory Visit: Payer: Medicare Other | Admitting: Family Medicine

## 2017-08-08 DIAGNOSIS — J069 Acute upper respiratory infection, unspecified: Secondary | ICD-10-CM | POA: Diagnosis not present

## 2017-08-29 ENCOUNTER — Encounter: Payer: Self-pay | Admitting: Internal Medicine

## 2017-08-29 ENCOUNTER — Ambulatory Visit (INDEPENDENT_AMBULATORY_CARE_PROVIDER_SITE_OTHER): Payer: Medicare Other

## 2017-08-29 ENCOUNTER — Ambulatory Visit (INDEPENDENT_AMBULATORY_CARE_PROVIDER_SITE_OTHER): Payer: Medicare Other | Admitting: Internal Medicine

## 2017-08-29 VITALS — BP 122/66 | HR 51 | Temp 97.3°F | Resp 18 | Wt 163.4 lb

## 2017-08-29 DIAGNOSIS — E7800 Pure hypercholesterolemia, unspecified: Secondary | ICD-10-CM

## 2017-08-29 DIAGNOSIS — R05 Cough: Secondary | ICD-10-CM

## 2017-08-29 DIAGNOSIS — R634 Abnormal weight loss: Secondary | ICD-10-CM

## 2017-08-29 DIAGNOSIS — R059 Cough, unspecified: Secondary | ICD-10-CM

## 2017-08-29 DIAGNOSIS — D649 Anemia, unspecified: Secondary | ICD-10-CM

## 2017-08-29 DIAGNOSIS — Z8546 Personal history of malignant neoplasm of prostate: Secondary | ICD-10-CM | POA: Diagnosis not present

## 2017-08-29 DIAGNOSIS — J984 Other disorders of lung: Secondary | ICD-10-CM | POA: Diagnosis not present

## 2017-08-29 LAB — CBC WITH DIFFERENTIAL/PLATELET
BASOS PCT: 1.2 % (ref 0.0–3.0)
Basophils Absolute: 0.1 10*3/uL (ref 0.0–0.1)
EOS PCT: 4.1 % (ref 0.0–5.0)
Eosinophils Absolute: 0.3 10*3/uL (ref 0.0–0.7)
HCT: 40.6 % (ref 39.0–52.0)
Hemoglobin: 13.4 g/dL (ref 13.0–17.0)
LYMPHS ABS: 2.3 10*3/uL (ref 0.7–4.0)
Lymphocytes Relative: 36.6 % (ref 12.0–46.0)
MCHC: 33.1 g/dL (ref 30.0–36.0)
MCV: 94.7 fl (ref 78.0–100.0)
Monocytes Absolute: 0.7 10*3/uL (ref 0.1–1.0)
Monocytes Relative: 11.5 % (ref 3.0–12.0)
Neutro Abs: 2.9 10*3/uL (ref 1.4–7.7)
Neutrophils Relative %: 46.6 % (ref 43.0–77.0)
Platelets: 213 10*3/uL (ref 150.0–400.0)
RBC: 4.29 Mil/uL (ref 4.22–5.81)
RDW: 13.6 % (ref 11.5–15.5)
WBC: 6.3 10*3/uL (ref 4.0–10.5)

## 2017-08-29 LAB — BASIC METABOLIC PANEL
BUN: 22 mg/dL (ref 6–23)
CALCIUM: 9.4 mg/dL (ref 8.4–10.5)
CO2: 31 mEq/L (ref 19–32)
CREATININE: 1.15 mg/dL (ref 0.40–1.50)
Chloride: 106 mEq/L (ref 96–112)
GFR: 64.17 mL/min (ref >60.00)
Glucose, Bld: 98 mg/dL (ref 70–99)
Potassium: 4.8 mEq/L (ref 3.5–5.1)
Sodium: 141 mEq/L (ref 135–145)

## 2017-08-29 LAB — HEPATIC FUNCTION PANEL
ALT: 9 U/L (ref 0–53)
AST: 14 U/L (ref 0–37)
Albumin: 4 g/dL (ref 3.5–5.2)
Alkaline Phosphatase: 67 U/L (ref 39–117)
Bilirubin, Direct: 0.1 mg/dL (ref 0.0–0.3)
Total Bilirubin: 0.7 mg/dL (ref 0.2–1.2)
Total Protein: 6.2 g/dL (ref 6.0–8.3)

## 2017-08-29 LAB — TSH: TSH: 2.52 u[IU]/mL (ref 0.35–4.50)

## 2017-08-29 NOTE — Progress Notes (Signed)
Patient ID: Todd Trujillo., male   DOB: Aug 23, 1929, 84 y.o.   MRN: [id number]   Subjective:    Patient ID: Todd Trujillo., male    DOB: 02/01/1930, 84 y.o.   MRN: [id number]  HPI  Patient here for a scheduled follow up.  Has a history of prostate cancer.  Followed by Dr Jacqlyn Larsen.  Just evaluated 03/04/17.  Stable.  Recommended f/u in one year.  He is accompanied by his daughter.  History obtained from both of them.  He reports noticing decreased apptetite.  Decreased taste.  Has lost more weight.  No abdominal pain.  Bowels moving.  No chest pain.  No sob.  No acid reflux.  Does report occasionally noticing some issues with swallowing.  States occasionally will notice food may hang.     Past Medical History:  Diagnosis Date  . Arthritis   . Cancer [facility])    prostate  . Hyperlipidemia   . Hypertension   . Left wrist fracture   . Macular degeneration of right eye   . Nephrolithiasis   . Prostate CA ([place]) 2003  . Prostate troubles    Patient was unsure of term   Past Surgical History:  Procedure Laterality Date  . TOTAL KNEE ARTHROPLASTY     Family History  Problem Relation Age of Onset  . Colon cancer Mother    Social History   Socioeconomic History  . Marital status: Married    Spouse name: None  . Number of children: None  . Years of education: None  . Highest education level: None  Social Needs  . Financial resource strain: None  . Food insecurity - worry: None  . Food insecurity - inability: None  . Transportation needs - medical: None  . Transportation needs - non-medical: None  Occupational History  . None  Tobacco Use  . Smoking status: Never Smoker  . Smokeless tobacco: Never Used  Substance and Sexual Activity  . Alcohol use: No  . Drug use: None  . Sexual activity: None  Other Topics Concern  . None  Social History Narrative  . None    Outpatient Encounter Medications as of 08/29/2017  Medication Sig  . ibuprofen (GOODSENSE  IBUPROFEN) 200 MG tablet Take 800 mg by mouth every 6 (six) hours as needed. For pain.  . metoprolol tartrate (LOPRESSOR) 25 MG tablet Take 1 tablet (25 mg total) by mouth every morning.  . tamsulosin (FLOMAX) 0.4 MG CAPS capsule Take 0.4 mg by mouth daily.  Marland Kitchen triamcinolone cream (KENALOG) 0.1 % Apply 1 application topically 2 (two) times daily.   No facility-administered encounter medications on file as of 08/29/2017.     Review of Systems  Constitutional:       Decreased po intake.  Gets full faster.  Has lost weight.   HENT: Negative for congestion and sinus pressure.   Respiratory: Negative for cough, chest tightness and shortness of breath.   Cardiovascular: Negative for chest pain, palpitations and leg swelling.  Gastrointestinal: Negative for abdominal pain, diarrhea, nausea and vomiting.  Genitourinary: Negative for difficulty urinating and dysuria.  Musculoskeletal: Negative for joint swelling and myalgias.  Skin: Negative for color change and rash.  Neurological: Negative for dizziness, light-headedness and headaches.  Psychiatric/Behavioral: Negative for agitation and dysphoric mood.       Objective:    Physical Exam  Constitutional: He appears well-developed and well-nourished. No distress.  HENT:  Nose: Nose normal.  Mouth/Throat: Oropharynx is clear and moist.  Neck: Neck supple. No thyromegaly present.  Cardiovascular: Normal rate and regular rhythm.  Pulmonary/Chest: Effort normal and breath sounds normal. No respiratory distress.  Abdominal: Soft. Bowel sounds are normal. There is no tenderness.  Musculoskeletal: He exhibits no edema or tenderness.  Lymphadenopathy:    He has no cervical adenopathy.  Skin: No rash noted. No erythema.  Psychiatric: He has a normal mood and affect. His behavior is normal.    BP 122/66 (BP Location: Left Arm, Patient Position: Sitting, Cuff Size: Normal)   Pulse (!) 51   Temp (!) 97.3 F (36.3 C) (Oral)   Resp 18   Wt 163 lb  6.4 oz (74.1 kg)   SpO2 98%   BMI 23.45 kg/m  Wt Readings from Last 3 Encounters:  08/29/17 163 lb 6.4 oz (74.1 kg)  02/18/17 170 lb 3.2 oz (77.2 kg)  12/14/16 170 lb 9.6 oz (77.4 kg)     Lab Results  Component Value Date   WBC 6.3 08/29/2017   HGB 13.4 08/29/2017   HCT 40.6 08/29/2017   PLT 213.0 08/29/2017   GLUCOSE 98 08/29/2017   CHOL 195 03/22/2017   TRIG 88.0 03/22/2017   HDL 44.10 03/22/2017   LDLCALC 133 (H) 03/22/2017   ALT 9 08/29/2017   AST 14 08/29/2017   NA 141 08/29/2017   K 4.8 08/29/2017   CL 106 08/29/2017   CREATININE 1.15 08/29/2017   BUN 22 08/29/2017   CO2 31 08/29/2017   TSH 2.52 08/29/2017   INR 1.34 01/08/2016    Mr Brain Wo Contrast  Result Date: 01/09/2016 CLINICAL DATA:  84 year old hypertensive male with headache and confusion. History prostate cancer. Subsequent encounter. EXAM: MRI HEAD WITHOUT CONTRAST TECHNIQUE: Multiplanar, multiecho pulse sequences of the brain and surrounding structures were obtained without intravenous contrast. COMPARISON:  CT and CT angiogram 01/08/2016.  No comparison MR. FINDINGS: No acute infarct or intracranial hemorrhage. Moderate chronic microvascular changes. Mild to moderate global atrophy without hydrocephalus. No intracranial mass lesion noted on this unenhanced exam. Major intracranial vascular structures are patent. Left mastoid air cell opacification without obstructing lesion of the eustachian tube noted. Minimal to mild mucosal thickening ethmoid sinus air cells. Post lens replacement without acute orbital abnormality. Mild spinal stenosis C3-4 incompletely assessed on present exam. Mild transverse ligament hypertrophy. Small pituitary gland. IMPRESSION: No acute infarct or intracranial hemorrhage. Moderate chronic microvascular changes. Atrophy without hydrocephalus. Opacification left mastoid air cells. Electronically Signed   By: Genia Del M.D.   On: 01/09/2016 11:27       Assessment & Plan:    Problem List Items Addressed This Visit    Anemia    Has a documented history of anemia.  Follow cbc.        Relevant Orders   CBC with Differential/Platelet (Completed)   History of prostate cancer    Followed by Dr Jacqlyn Larsen.  Has been stable.        Hypercholesterolemia    Follow lipid panel.        Weight loss - Primary    Weight down more from previous check.  Some decreased appetite.  Does get full a little faster.  Some issues with swallowing as outlined.  Check cbc, metabolic panel and thyroid function.  Also check cxr.  Discussed GI referral.        Relevant Orders   Hepatic function panel (Completed)   TSH (Completed)   Basic metabolic panel (Completed)   DG Chest 2 View (Completed)  Other Visit Diagnoses    Cough       Relevant Orders   DG Chest 2 View (Completed)       Einar Pheasant, MD

## 2017-08-30 ENCOUNTER — Encounter: Payer: Self-pay | Admitting: Internal Medicine

## 2017-08-31 ENCOUNTER — Encounter: Payer: Self-pay | Admitting: Internal Medicine

## 2017-09-01 ENCOUNTER — Encounter: Payer: Self-pay | Admitting: Internal Medicine

## 2017-09-01 NOTE — Assessment & Plan Note (Signed)
Has a documented history of anemia.  Follow cbc.

## 2017-09-01 NOTE — Assessment & Plan Note (Signed)
Followed by Dr Jacqlyn Larsen.  Has been stable.

## 2017-09-01 NOTE — Assessment & Plan Note (Signed)
Follow lipid panel.   

## 2017-09-01 NOTE — Assessment & Plan Note (Signed)
Weight down more from previous check.  Some decreased appetite.  Does get full a little faster.  Some issues with swallowing as outlined.  Check cbc, metabolic panel and thyroid function.  Also check cxr.  Discussed GI referral.

## 2017-10-02 ENCOUNTER — Telehealth: Payer: Self-pay

## 2017-10-02 NOTE — Telephone Encounter (Signed)
Copied from [place] [id number]. Topic: General - Other >> Oct 02, 2017  1:33 PM Yvette Rack wrote: Reason for CRM: patient states that he had dropped some bills with two checks outside an if its found just tear them up per pt and give him a call at home

## 2017-10-28 ENCOUNTER — Encounter: Payer: Self-pay | Admitting: Internal Medicine

## 2017-10-28 ENCOUNTER — Ambulatory Visit (INDEPENDENT_AMBULATORY_CARE_PROVIDER_SITE_OTHER): Payer: Medicare Other | Admitting: Internal Medicine

## 2017-10-28 VITALS — BP 132/70 | HR 67 | Temp 97.8°F | Resp 18 | Wt 168.8 lb

## 2017-10-28 DIAGNOSIS — E7800 Pure hypercholesterolemia, unspecified: Secondary | ICD-10-CM | POA: Diagnosis not present

## 2017-10-28 DIAGNOSIS — D649 Anemia, unspecified: Secondary | ICD-10-CM

## 2017-10-28 DIAGNOSIS — Z8546 Personal history of malignant neoplasm of prostate: Secondary | ICD-10-CM

## 2017-10-28 DIAGNOSIS — R634 Abnormal weight loss: Secondary | ICD-10-CM | POA: Diagnosis not present

## 2017-10-28 DIAGNOSIS — R5383 Other fatigue: Secondary | ICD-10-CM

## 2017-10-28 NOTE — Progress Notes (Signed)
Patient ID: Todd Fears., male   DOB: 1929-07-26, 84 y.o.   MRN: [id number]   Subjective:    Patient ID: Todd Fears., male    DOB: Jan 04, 1930, 84 y.o.   MRN: [id number]  HPI  Patient here for a scheduled follow up.  He is accompanied by his daughter.  History obtained from both of them.  He reports he feels better.  Eating better.  Weight is up.  No chest pain.  No sob.  No acid reflux.  No abdominal pain.  Bowels moving.  No urine change.  Some decreased energy at times, but overall he feels he is doing relatively well.     Past Medical History:  Diagnosis Date  . Arthritis   . Cancer [facility])    prostate  . Hyperlipidemia   . Hypertension   . Left wrist fracture   . Macular degeneration of right eye   . Nephrolithiasis   . Prostate CA ([place]) 2003  . Prostate troubles    Patient was unsure of term   Past Surgical History:  Procedure Laterality Date  . TOTAL KNEE ARTHROPLASTY     Family History  Problem Relation Age of Onset  . Colon cancer Mother    Social History   Socioeconomic History  . Marital status: Married    Spouse name: Not on file  . Number of children: Not on file  . Years of education: Not on file  . Highest education level: Not on file  Occupational History  . Not on file  Social Needs  . Financial resource strain: Not on file  . Food insecurity:    Worry: Not on file    Inability: Not on file  . Transportation needs:    Medical: Not on file    Non-medical: Not on file  Tobacco Use  . Smoking status: Never Smoker  . Smokeless tobacco: Never Used  Substance and Sexual Activity  . Alcohol use: No  . Drug use: Not on file  . Sexual activity: Not on file  Lifestyle  . Physical activity:    Days per week: Not on file    Minutes per session: Not on file  . Stress: Not on file  Relationships  . Social connections:    Talks on phone: Not on file    Gets together: Not on file    Attends religious service: Not on file   Active member of club or organization: Not on file    Attends meetings of clubs or organizations: Not on file    Relationship status: Not on file  Other Topics Concern  . Not on file  Social History Narrative  . Not on file    Outpatient Encounter Medications as of 10/28/2017  Medication Sig  . ibuprofen (GOODSENSE IBUPROFEN) 200 MG tablet Take 800 mg by mouth every 6 (six) hours as needed. For pain.  . tamsulosin (FLOMAX) 0.4 MG CAPS capsule Take 0.4 mg by mouth daily.  Marland Kitchen triamcinolone cream (KENALOG) 0.1 % Apply 1 application topically 2 (two) times daily.  . [DISCONTINUED] metoprolol tartrate (LOPRESSOR) 25 MG tablet Take 1 tablet (25 mg total) by mouth every morning.   No facility-administered encounter medications on file as of 10/28/2017.     Review of Systems  Constitutional: Negative for appetite change and unexpected weight change.  HENT: Negative for congestion and sinus pressure.   Respiratory: Negative for cough, chest tightness and shortness of breath.   Cardiovascular: Negative for chest pain, palpitations  and leg swelling.  Gastrointestinal: Negative for abdominal pain, diarrhea, nausea and vomiting.  Genitourinary: Negative for difficulty urinating and dysuria.  Musculoskeletal: Negative for joint swelling and myalgias.  Skin: Negative for color change and rash.  Neurological: Negative for dizziness, light-headedness and headaches.  Psychiatric/Behavioral: Negative for agitation and dysphoric mood.       Objective:    Physical Exam  Constitutional: He appears well-developed and well-nourished. No distress.  HENT:  Nose: Nose normal.  Mouth/Throat: Oropharynx is clear and moist.  Neck: Neck supple. No thyromegaly present.  Cardiovascular: Normal rate and regular rhythm.  Pulmonary/Chest: Effort normal and breath sounds normal. No respiratory distress.  Abdominal: Soft. Bowel sounds are normal. There is no tenderness.  Musculoskeletal: He exhibits no edema or  tenderness.  Lymphadenopathy:    He has no cervical adenopathy.  Skin: No rash noted. No erythema.  Psychiatric: He has a normal mood and affect. His behavior is normal.    BP 132/70 (BP Location: Left Arm, Patient Position: Sitting, Cuff Size: Normal)   Pulse 67   Temp 97.8 F (36.6 C) (Oral)   Resp 18   Wt 168 lb 12.8 oz (76.6 kg)   SpO2 97%   BMI 24.22 kg/m  Wt Readings from Last 3 Encounters:  10/28/17 168 lb 12.8 oz (76.6 kg)  08/29/17 163 lb 6.4 oz (74.1 kg)  02/18/17 170 lb 3.2 oz (77.2 kg)     Lab Results  Component Value Date   WBC 6.3 08/29/2017   HGB 13.4 08/29/2017   HCT 40.6 08/29/2017   PLT 213.0 08/29/2017   GLUCOSE 98 08/29/2017   CHOL 195 03/22/2017   TRIG 88.0 03/22/2017   HDL 44.10 03/22/2017   LDLCALC 133 (H) 03/22/2017   ALT 9 08/29/2017   AST 14 08/29/2017   NA 141 08/29/2017   K 4.8 08/29/2017   CL 106 08/29/2017   CREATININE 1.15 08/29/2017   BUN 22 08/29/2017   CO2 31 08/29/2017   TSH 2.52 08/29/2017   INR 1.34 01/08/2016    Mr Brain Wo Contrast  Result Date: 01/09/2016 CLINICAL DATA:  84 year old hypertensive male with headache and confusion. History prostate cancer. Subsequent encounter. EXAM: MRI HEAD WITHOUT CONTRAST TECHNIQUE: Multiplanar, multiecho pulse sequences of the brain and surrounding structures were obtained without intravenous contrast. COMPARISON:  CT and CT angiogram 01/08/2016.  No comparison MR. FINDINGS: No acute infarct or intracranial hemorrhage. Moderate chronic microvascular changes. Mild to moderate global atrophy without hydrocephalus. No intracranial mass lesion noted on this unenhanced exam. Major intracranial vascular structures are patent. Left mastoid air cell opacification without obstructing lesion of the eustachian tube noted. Minimal to mild mucosal thickening ethmoid sinus air cells. Post lens replacement without acute orbital abnormality. Mild spinal stenosis C3-4 incompletely assessed on present exam. Mild  transverse ligament hypertrophy. Small pituitary gland. IMPRESSION: No acute infarct or intracranial hemorrhage. Moderate chronic microvascular changes. Atrophy without hydrocephalus. Opacification left mastoid air cells. Electronically Signed   By: Genia Del M.D.   On: 01/09/2016 11:27       Assessment & Plan:   Problem List Items Addressed This Visit    Anemia - Primary    Has a documented history of anemia.  Follow cbc.        Relevant Orders   Vitamin B12   History of prostate cancer    Followed by Dr cope.  Has been sable.        Hypercholesterolemia    Follow lipid panel.  Relevant Orders   Hepatic function panel   Lipid panel   Basic metabolic panel   Weight loss    Previous weight loss.  Weight back up now.  Eating well.  Follow.         Other Visit Diagnoses    Other fatigue       Some mild fatigue.  recent hgb and thyroid wnl.  check B12 , vitamin D and routine labs.        Einar Pheasant, MD

## 2017-10-30 ENCOUNTER — Other Ambulatory Visit: Payer: Self-pay | Admitting: Internal Medicine

## 2017-11-02 ENCOUNTER — Encounter: Payer: Self-pay | Admitting: Internal Medicine

## 2017-11-02 NOTE — Assessment & Plan Note (Signed)
Follow lipid panel.   

## 2017-11-02 NOTE — Assessment & Plan Note (Signed)
Has a documented history of anemia.  Follow cbc.

## 2017-11-02 NOTE — Assessment & Plan Note (Signed)
Followed by Dr cope.  Has been sable.

## 2017-11-02 NOTE — Assessment & Plan Note (Signed)
Previous weight loss.  Weight back up now.  Eating well.  Follow.

## 2017-11-06 ENCOUNTER — Telehealth: Payer: Self-pay

## 2017-11-06 ENCOUNTER — Other Ambulatory Visit (INDEPENDENT_AMBULATORY_CARE_PROVIDER_SITE_OTHER): Payer: Medicare Other

## 2017-11-06 ENCOUNTER — Telehealth: Payer: Self-pay | Admitting: Internal Medicine

## 2017-11-06 DIAGNOSIS — D649 Anemia, unspecified: Secondary | ICD-10-CM | POA: Diagnosis not present

## 2017-11-06 DIAGNOSIS — E7800 Pure hypercholesterolemia, unspecified: Secondary | ICD-10-CM | POA: Diagnosis not present

## 2017-11-06 LAB — HEPATIC FUNCTION PANEL
ALK PHOS: 65 U/L (ref 39–117)
ALT: 8 U/L (ref 0–53)
AST: 15 U/L (ref 0–37)
Albumin: 3.9 g/dL (ref 3.5–5.2)
BILIRUBIN DIRECT: 0.2 mg/dL (ref 0.0–0.3)
BILIRUBIN TOTAL: 1 mg/dL (ref 0.2–1.2)
TOTAL PROTEIN: 6.4 g/dL (ref 6.0–8.3)

## 2017-11-06 LAB — LIPID PANEL
Cholesterol: 201 mg/dL — ABNORMAL HIGH (ref 0–200)
HDL: 53 mg/dL (ref >39.00)
LDL CALC: 136 mg/dL — AB (ref 0–99)
NonHDL: 147.71
TRIGLYCERIDES: 59 mg/dL (ref 0.0–149.0)
Total CHOL/HDL Ratio: 4
VLDL: 11.8 mg/dL (ref 0.0–40.0)

## 2017-11-06 LAB — BASIC METABOLIC PANEL
BUN: 19 mg/dL (ref 6–23)
CALCIUM: 9.4 mg/dL (ref 8.4–10.5)
CO2: 28 meq/L (ref 19–32)
CREATININE: 1.04 mg/dL (ref 0.40–1.50)
Chloride: 106 mEq/L (ref 96–112)
GFR: 72.03 mL/min (ref >60.00)
Glucose, Bld: 97 mg/dL (ref 70–99)
Potassium: 4.9 mEq/L (ref 3.5–5.1)
SODIUM: 138 meq/L (ref 135–145)

## 2017-11-06 LAB — VITAMIN B12: VITAMIN B 12: 317 pg/mL (ref 211–911)

## 2017-11-06 NOTE — Telephone Encounter (Signed)
Noted.  Offered to see pt.  He declined.  Feeling better.

## 2017-11-06 NOTE — Telephone Encounter (Signed)
Copied from [place]. Topic: Quick Communication - See Telephone Encounter >> Nov 06, 2017 10:20 AM Nanci Pina, LPN wrote: CRM for notification. See Telephone encounter for: 11/06/17.  >> Nov 06, 2017 10:40 AM Darl Householder, RMA wrote: Patient called back and states he is feeling much better

## 2017-11-06 NOTE — Telephone Encounter (Signed)
Patient came in for fasting labs this morning with c/ofeeling weak ,nurse contacted by lab Marye Round) , patient requesting BP check. Patient was allowed to rest while nurse triaged with questions. Patient stated he was fasting for labs and last meal was 11/05/17 at 4:30 PM and that he had only had a few sips of black coffee this morning. Patient stated he mowed his yard yesterday and did not drink any fluids while mowing. Complained he had cramps during the night. Patient had difficulty telling nurse PCP name, but could verbalize all other information without hesitation name , date of birthday, day of the week. Patient vitals as follows 02 sat@ 97, pulse 58 BP 122/70. Information gathered was reported to PCP and PCP advised nurse to get CBG and ask patient if he wanted to be seen or of he wanted food or drink while in office . Patient stated no he did not want to be seen that he was feeling some better and that he really felt he just needed to eat and preferred to go and get some real breakfast. Patient CBG was 88 fasting more than 12 hours.

## 2017-11-06 NOTE — Telephone Encounter (Signed)
Left message for patient to return call to office, called to see if patient feeling better. PEC may advise and notify if feeling better.

## 2017-11-06 NOTE — Telephone Encounter (Signed)
See note below patient feeling much better. FYI

## 2017-11-27 DIAGNOSIS — D0461 Carcinoma in situ of skin of right upper limb, including shoulder: Secondary | ICD-10-CM | POA: Diagnosis not present

## 2017-11-27 DIAGNOSIS — Z85828 Personal history of other malignant neoplasm of skin: Secondary | ICD-10-CM | POA: Diagnosis not present

## 2017-11-27 DIAGNOSIS — L570 Actinic keratosis: Secondary | ICD-10-CM | POA: Diagnosis not present

## 2017-11-27 DIAGNOSIS — L986 Other infiltrative disorders of the skin and subcutaneous tissue: Secondary | ICD-10-CM | POA: Diagnosis not present

## 2017-12-17 DIAGNOSIS — D0461 Carcinoma in situ of skin of right upper limb, including shoulder: Secondary | ICD-10-CM | POA: Diagnosis not present

## 2018-01-09 DIAGNOSIS — H902 Conductive hearing loss, unspecified: Secondary | ICD-10-CM | POA: Diagnosis not present

## 2018-01-09 DIAGNOSIS — H6123 Impacted cerumen, bilateral: Secondary | ICD-10-CM | POA: Diagnosis not present

## 2018-02-21 ENCOUNTER — Ambulatory Visit (INDEPENDENT_AMBULATORY_CARE_PROVIDER_SITE_OTHER): Payer: Medicare Other | Admitting: Internal Medicine

## 2018-02-21 ENCOUNTER — Encounter: Payer: Self-pay | Admitting: Internal Medicine

## 2018-02-21 DIAGNOSIS — E7800 Pure hypercholesterolemia, unspecified: Secondary | ICD-10-CM | POA: Diagnosis not present

## 2018-02-21 DIAGNOSIS — Z8546 Personal history of malignant neoplasm of prostate: Secondary | ICD-10-CM

## 2018-02-21 DIAGNOSIS — D649 Anemia, unspecified: Secondary | ICD-10-CM

## 2018-02-21 DIAGNOSIS — R634 Abnormal weight loss: Secondary | ICD-10-CM

## 2018-02-21 NOTE — Progress Notes (Signed)
Patient ID: Todd Trujillo., male   DOB: Apr 15, 1930, 84 y.o.   MRN: [id number]   Subjective:    Patient ID: Todd Trujillo., male    DOB: Jul 10, 1930, 84 y.o.   MRN: [id number]  HPI  Patient here for a scheduled follow up.  States he has been doing relatively well.  Tries to stay active.  No chest pain.  No sob.  No acid reflux.  No abdominal pain.  Bowels moving.  No urine change.  Reports some decreased po intake.  States this is related to food preparation.  Eats out or when he eats what he wants - he eats better.   Has been seeing Dr Jacqlyn Larsen.  Last evaluated 02/2017.  Request to see Dr Bernardo Heater now.     Past Medical History:  Diagnosis Date  . Arthritis   . Cancer [facility])    prostate  . Hyperlipidemia   . Hypertension   . Left wrist fracture   . Macular degeneration of right eye   . Nephrolithiasis   . Prostate CA ([place]) 2003  . Prostate troubles    Patient was unsure of term   Past Surgical History:  Procedure Laterality Date  . TOTAL KNEE ARTHROPLASTY     Family History  Problem Relation Age of Onset  . Colon cancer Mother    Social History   Socioeconomic History  . Marital status: Married    Spouse name: Not on file  . Number of children: Not on file  . Years of education: Not on file  . Highest education level: Not on file  Occupational History  . Not on file  Social Needs  . Financial resource strain: Not on file  . Food insecurity:    Worry: Not on file    Inability: Not on file  . Transportation needs:    Medical: Not on file    Non-medical: Not on file  Tobacco Use  . Smoking status: Never Smoker  . Smokeless tobacco: Never Used  Substance and Sexual Activity  . Alcohol use: No  . Drug use: Not on file  . Sexual activity: Not on file  Lifestyle  . Physical activity:    Days per week: Not on file    Minutes per session: Not on file  . Stress: Not on file  Relationships  . Social connections:    Talks on phone: Not on file   Gets together: Not on file    Attends religious service: Not on file    Active member of club or organization: Not on file    Attends meetings of clubs or organizations: Not on file    Relationship status: Not on file  Other Topics Concern  . Not on file  Social History Narrative  . Not on file    Outpatient Encounter Medications as of 02/21/2018  Medication Sig  . ibuprofen (GOODSENSE IBUPROFEN) 200 MG tablet Take 800 mg by mouth every 6 (six) hours as needed. For pain.  . metoprolol tartrate (LOPRESSOR) 25 MG tablet TAKE ONE TABLET EVERY MORNING  . tamsulosin (FLOMAX) 0.4 MG CAPS capsule Take 0.4 mg by mouth daily.  Marland Kitchen triamcinolone cream (KENALOG) 0.1 % Apply 1 application topically 2 (two) times daily.   No facility-administered encounter medications on file as of 02/21/2018.     Review of Systems  Constitutional: Negative for appetite change and unexpected weight change.  HENT: Negative for congestion and sinus pressure.   Respiratory: Negative for cough, chest tightness and  shortness of breath.   Cardiovascular: Negative for chest pain, palpitations and leg swelling.  Gastrointestinal: Negative for abdominal pain, diarrhea, nausea and vomiting.  Genitourinary: Negative for difficulty urinating and dysuria.  Musculoskeletal: Negative for joint swelling and myalgias.  Skin: Negative for color change and rash.  Neurological: Negative for dizziness, light-headedness and headaches.  Psychiatric/Behavioral: Negative for agitation and dysphoric mood.       Objective:    Physical Exam  Constitutional: He appears well-developed and well-nourished. No distress.  HENT:  Nose: Nose normal.  Mouth/Throat: Oropharynx is clear and moist.  Neck: Neck supple.  Cardiovascular: Normal rate and regular rhythm.  Pulmonary/Chest: Effort normal and breath sounds normal. No respiratory distress.  Abdominal: Soft. Bowel sounds are normal. There is no tenderness.  Musculoskeletal: He exhibits no  edema or tenderness.  Lymphadenopathy:    He has no cervical adenopathy.  Skin: No rash noted. No erythema.  Psychiatric: He has a normal mood and affect. His behavior is normal.    BP 122/70 (BP Location: Left Arm, Patient Position: Sitting, Cuff Size: Normal)   Pulse 63   Temp 97.6 F (36.4 C) (Oral)   Resp 18   Wt 165 lb 3.2 oz (74.9 kg)   SpO2 98%   BMI 23.70 kg/m  Wt Readings from Last 3 Encounters:  02/21/18 165 lb 3.2 oz (74.9 kg)  10/28/17 168 lb 12.8 oz (76.6 kg)  08/29/17 163 lb 6.4 oz (74.1 kg)     Lab Results  Component Value Date   WBC 6.3 08/29/2017   HGB 13.4 08/29/2017   HCT 40.6 08/29/2017   PLT 213.0 08/29/2017   GLUCOSE 97 11/06/2017   CHOL 201 (H) 11/06/2017   TRIG 59.0 11/06/2017   HDL 53.00 11/06/2017   LDLCALC 136 (H) 11/06/2017   ALT 8 11/06/2017   AST 15 11/06/2017   NA 138 11/06/2017   K 4.9 11/06/2017   CL 106 11/06/2017   CREATININE 1.04 11/06/2017   BUN 19 11/06/2017   CO2 28 11/06/2017   TSH 2.52 08/29/2017   INR 1.34 01/08/2016    Mr Brain Wo Contrast  Result Date: 01/09/2016 CLINICAL DATA:  84 year old hypertensive male with headache and confusion. History prostate cancer. Subsequent encounter. EXAM: MRI HEAD WITHOUT CONTRAST TECHNIQUE: Multiplanar, multiecho pulse sequences of the brain and surrounding structures were obtained without intravenous contrast. COMPARISON:  CT and CT angiogram 01/08/2016.  No comparison MR. FINDINGS: No acute infarct or intracranial hemorrhage. Moderate chronic microvascular changes. Mild to moderate global atrophy without hydrocephalus. No intracranial mass lesion noted on this unenhanced exam. Major intracranial vascular structures are patent. Left mastoid air cell opacification without obstructing lesion of the eustachian tube noted. Minimal to mild mucosal thickening ethmoid sinus air cells. Post lens replacement without acute orbital abnormality. Mild spinal stenosis C3-4 incompletely assessed on  present exam. Mild transverse ligament hypertrophy. Small pituitary gland. IMPRESSION: No acute infarct or intracranial hemorrhage. Moderate chronic microvascular changes. Atrophy without hydrocephalus. Opacification left mastoid air cells. Electronically Signed   By: Genia Del M.D.   On: 01/09/2016 11:27       Assessment & Plan:   Problem List Items Addressed This Visit    Anemia    Follow cbc.       History of prostate cancer    Has been followed by Dr Jacqlyn Larsen.  Request to see Dr Bernardo Heater given Dr Jacqlyn Larsen moved.        Hypercholesterolemia    Follow lipid panel.  Relevant Orders   Lipid panel   Hepatic function panel   Basic metabolic panel   Weight loss    Has been eating well.  Weight is down a few pounds.  States if he can choose his food, he eats better.  Will follow.            Einar Pheasant, MD

## 2018-02-23 ENCOUNTER — Encounter: Payer: Self-pay | Admitting: Internal Medicine

## 2018-02-23 NOTE — Assessment & Plan Note (Signed)
Follow cbc.  

## 2018-02-23 NOTE — Assessment & Plan Note (Signed)
Follow lipid panel.   

## 2018-02-23 NOTE — Assessment & Plan Note (Signed)
Has been eating well.  Weight is down a few pounds.  States if he can choose his food, he eats better.  Will follow.

## 2018-02-23 NOTE — Assessment & Plan Note (Signed)
Has been followed by Dr Jacqlyn Larsen.  Request to see Dr Bernardo Heater given Dr Jacqlyn Larsen moved.

## 2018-04-07 ENCOUNTER — Other Ambulatory Visit: Payer: 59

## 2018-06-03 ENCOUNTER — Ambulatory Visit: Payer: 59 | Admitting: Internal Medicine

## 2018-07-01 DIAGNOSIS — H6123 Impacted cerumen, bilateral: Secondary | ICD-10-CM | POA: Diagnosis not present

## 2018-07-01 DIAGNOSIS — H9190 Unspecified hearing loss, unspecified ear: Secondary | ICD-10-CM | POA: Diagnosis not present

## 2018-07-11 ENCOUNTER — Ambulatory Visit: Payer: Self-pay | Admitting: Podiatry

## 2018-08-11 ENCOUNTER — Ambulatory Visit: Payer: Self-pay | Admitting: Podiatry

## 2018-08-13 ENCOUNTER — Ambulatory Visit (INDEPENDENT_AMBULATORY_CARE_PROVIDER_SITE_OTHER): Payer: Medicare Other | Admitting: Podiatry

## 2018-08-13 ENCOUNTER — Other Ambulatory Visit: Payer: Self-pay | Admitting: Podiatry

## 2018-08-13 ENCOUNTER — Encounter: Payer: Self-pay | Admitting: Podiatry

## 2018-08-13 ENCOUNTER — Ambulatory Visit (INDEPENDENT_AMBULATORY_CARE_PROVIDER_SITE_OTHER): Payer: Medicare Other

## 2018-08-13 VITALS — BP 140/62 | HR 60 | Resp 16

## 2018-08-13 DIAGNOSIS — Z860100 Personal history of colon polyps, unspecified: Secondary | ICD-10-CM | POA: Insufficient documentation

## 2018-08-13 DIAGNOSIS — Z87442 Personal history of urinary calculi: Secondary | ICD-10-CM | POA: Insufficient documentation

## 2018-08-13 DIAGNOSIS — Q828 Other specified congenital malformations of skin: Secondary | ICD-10-CM

## 2018-08-13 DIAGNOSIS — Z862 Personal history of diseases of the blood and blood-forming organs and certain disorders involving the immune mechanism: Secondary | ICD-10-CM | POA: Insufficient documentation

## 2018-08-13 DIAGNOSIS — M722 Plantar fascial fibromatosis: Secondary | ICD-10-CM

## 2018-08-13 DIAGNOSIS — M2041 Other hammer toe(s) (acquired), right foot: Secondary | ICD-10-CM

## 2018-08-13 DIAGNOSIS — N400 Enlarged prostate without lower urinary tract symptoms: Secondary | ICD-10-CM | POA: Insufficient documentation

## 2018-08-13 DIAGNOSIS — Z8701 Personal history of pneumonia (recurrent): Secondary | ICD-10-CM | POA: Insufficient documentation

## 2018-08-13 DIAGNOSIS — I739 Peripheral vascular disease, unspecified: Secondary | ICD-10-CM | POA: Diagnosis not present

## 2018-08-13 DIAGNOSIS — M1990 Unspecified osteoarthritis, unspecified site: Secondary | ICD-10-CM | POA: Insufficient documentation

## 2018-08-13 DIAGNOSIS — K5790 Diverticulosis of intestine, part unspecified, without perforation or abscess without bleeding: Secondary | ICD-10-CM | POA: Insufficient documentation

## 2018-08-13 NOTE — Addendum Note (Signed)
Addended by: Clovis Riley E on: 08/13/2018 02:20 PM   Modules accepted: Orders

## 2018-08-13 NOTE — Addendum Note (Signed)
Addended by: Graceann Congress D on: 08/13/2018 12:21 PM   Modules accepted: Orders

## 2018-08-13 NOTE — Progress Notes (Signed)
  Subjective:  Patient ID: Todd Trujillo., male    DOB: 1930-01-23,  MRN: [id number] HPI Chief Complaint  Patient presents with  . Toe Pain    Hallux right - 2nd toe overlapping x years, just started to get real sore, callused area lateral border big toe, keeps spacer between the toes for cushioning  . New Patient (Initial Visit)    85 y.o. male presents with the above complaint.   ROS: Denies fever chills nausea vomiting muscle aches pains calf pain chest pain shortness of breath and headache.  Past Medical History:  Diagnosis Date  . Arthritis   . Cancer [facility])    prostate  . Hyperlipidemia   . Hypertension   . Left wrist fracture   . Macular degeneration of right eye   . Nephrolithiasis   . Prostate CA ([place]) 2003  . Prostate troubles    Patient was unsure of term   Past Surgical History:  Procedure Laterality Date  . TOTAL KNEE ARTHROPLASTY      Current Outpatient Medications:  .  lisinopril (PRINIVIL,ZESTRIL) 20 MG tablet, , Disp: , Rfl:  .  metoprolol tartrate (LOPRESSOR) 25 MG tablet, TAKE ONE TABLET EVERY MORNING, Disp: 90 tablet, Rfl: 1 .  tamsulosin (FLOMAX) 0.4 MG CAPS capsule, Take 0.4 mg by mouth daily., Disp: , Rfl:   Allergies  Allergen Reactions  . Codeine Nausea And Vomiting   Review of Systems Objective:   Vitals:   08/13/18 1103  BP: 140/62  Pulse: 60  Resp: 16    General: Well developed, nourished, in no acute distress, alert and oriented x3   Dermatological: Skin is warm, dry and supple bilateral. Nails x 10 are well maintained; remaining integument appears unremarkable at this time. There are no open sores, no preulcerative lesions, no rash or signs of infection present.  Dystrophic nail hallux right with reactive hyper keratoma secondary to juxtaposition of the second toe and the hallux and irritation.  Vascular: Dorsalis Pedis artery and Posterior Tibial artery pedal pulses are diminished bilateral with delayed capillary fill  time. Pedal hair growth present. No varicosities and no lower extremity edema present bilateral.   Neruologic: Grossly intact via light touch bilateral. Vibratory intact via tuning fork bilateral. Protective threshold with Semmes Wienstein monofilament intact to all pedal sites bilateral. Patellar and Achilles deep tendon reflexes 2+ bilateral. No Babinski or clonus noted bilateral.   Musculoskeletal: No gross boney pedal deformities bilateral. No pain, crepitus, or limitation noted with foot and ankle range of motion bilateral. Muscular strength 5/5 in all groups tested bilateral.  Hammertoe deformity second right with hallux valgus deformity.  Second toe is overlapping the hallux and is painful.  Gait: Unassisted, Nonantalgic.    Radiographs:  Hammertoe deformity with hallux valgus deformity osteopenia right foot.  Assessment & Plan:   Assessment: Hallux valgus and overlapping second toe with peripheral vascular disease reactive hyper keratoma and nail dystrophy.  Plan: I debrided the toenail debrided the reactive hyperkeratotic lesion we are also going to send him for ABIs and then we will have him back to discuss our options and he is in full agreement of removing the second toe.     Chiana Wamser T. [place], [place]

## 2018-09-25 DIAGNOSIS — R1084 Generalized abdominal pain: Secondary | ICD-10-CM | POA: Diagnosis not present

## 2018-10-01 ENCOUNTER — Other Ambulatory Visit: Payer: Self-pay

## 2018-10-01 ENCOUNTER — Encounter: Payer: Self-pay | Admitting: Podiatry

## 2018-10-01 ENCOUNTER — Ambulatory Visit (INDEPENDENT_AMBULATORY_CARE_PROVIDER_SITE_OTHER): Payer: Medicare Other | Admitting: Podiatry

## 2018-10-01 DIAGNOSIS — I739 Peripheral vascular disease, unspecified: Secondary | ICD-10-CM

## 2018-10-01 DIAGNOSIS — M2041 Other hammer toe(s) (acquired), right foot: Secondary | ICD-10-CM | POA: Diagnosis not present

## 2018-10-01 NOTE — Progress Notes (Signed)
Presents today states that his toe is doing much better now that he is been using the spacer.  States that the [place] did his vascular evaluation and stated that the right leg was not very good at all did not recommend any surgery on the right leg at all.  Objective: No change in physical exam.  Pulses are nonpalpable no open lesions or wounds on physical exam today.  Assessment: Hammertoe deformity overlapping second toe over the hallux.  Plan: Continue the use the silicone pads and I provided a couple for him today I will follow-up with him as needed.

## 2018-10-02 ENCOUNTER — Telehealth (INDEPENDENT_AMBULATORY_CARE_PROVIDER_SITE_OTHER): Payer: Self-pay | Admitting: Nurse Practitioner

## 2019-02-10 DIAGNOSIS — R42 Dizziness and giddiness: Secondary | ICD-10-CM | POA: Diagnosis not present

## 2019-02-10 DIAGNOSIS — H6123 Impacted cerumen, bilateral: Secondary | ICD-10-CM | POA: Diagnosis not present

## 2019-02-10 DIAGNOSIS — H903 Sensorineural hearing loss, bilateral: Secondary | ICD-10-CM | POA: Diagnosis not present

## 2019-07-01 DIAGNOSIS — Z85828 Personal history of other malignant neoplasm of skin: Secondary | ICD-10-CM | POA: Diagnosis not present

## 2019-07-01 DIAGNOSIS — Z1283 Encounter for screening for malignant neoplasm of skin: Secondary | ICD-10-CM | POA: Diagnosis not present

## 2019-07-01 DIAGNOSIS — L821 Other seborrheic keratosis: Secondary | ICD-10-CM | POA: Diagnosis not present

## 2019-09-12 ENCOUNTER — Observation Stay
Admission: EM | Admit: 2019-09-12 | Discharge: 2019-09-13 | Disposition: A | Discharge status: Home / Self Care | Payer: Medicare Other | Attending: Internal Medicine | Admitting: Internal Medicine

## 2019-09-12 ENCOUNTER — Emergency Department: Payer: Medicare Other

## 2019-09-12 ENCOUNTER — Other Ambulatory Visit: Payer: Self-pay

## 2019-09-12 ENCOUNTER — Observation Stay: Payer: Medicare Other

## 2019-09-12 ENCOUNTER — Encounter: Payer: Self-pay | Admitting: Emergency Medicine

## 2019-09-12 DIAGNOSIS — R200 Anesthesia of skin: Secondary | ICD-10-CM | POA: Diagnosis present

## 2019-09-12 DIAGNOSIS — Z96659 Presence of unspecified artificial knee joint: Secondary | ICD-10-CM | POA: Diagnosis not present

## 2019-09-12 DIAGNOSIS — I639 Cerebral infarction, unspecified: Secondary | ICD-10-CM

## 2019-09-12 DIAGNOSIS — Z8673 Personal history of transient ischemic attack (TIA), and cerebral infarction without residual deficits: Secondary | ICD-10-CM | POA: Diagnosis not present

## 2019-09-12 DIAGNOSIS — I672 Cerebral atherosclerosis: Secondary | ICD-10-CM | POA: Insufficient documentation

## 2019-09-12 DIAGNOSIS — Z86010 Personal history of colonic polyps: Secondary | ICD-10-CM | POA: Diagnosis not present

## 2019-09-12 DIAGNOSIS — E785 Hyperlipidemia, unspecified: Secondary | ICD-10-CM | POA: Diagnosis not present

## 2019-09-12 DIAGNOSIS — Z800 Family history of malignant neoplasm of digestive organs: Secondary | ICD-10-CM | POA: Insufficient documentation

## 2019-09-12 DIAGNOSIS — H3530 Unspecified macular degeneration: Secondary | ICD-10-CM | POA: Insufficient documentation

## 2019-09-12 DIAGNOSIS — Z885 Allergy status to narcotic agent status: Secondary | ICD-10-CM | POA: Diagnosis not present

## 2019-09-12 DIAGNOSIS — R269 Unspecified abnormalities of gait and mobility: Secondary | ICD-10-CM | POA: Insufficient documentation

## 2019-09-12 DIAGNOSIS — E7800 Pure hypercholesterolemia, unspecified: Secondary | ICD-10-CM | POA: Diagnosis not present

## 2019-09-12 DIAGNOSIS — M1990 Unspecified osteoarthritis, unspecified site: Secondary | ICD-10-CM | POA: Insufficient documentation

## 2019-09-12 DIAGNOSIS — R29810 Facial weakness: Secondary | ICD-10-CM | POA: Insufficient documentation

## 2019-09-12 DIAGNOSIS — Z20822 Contact with and (suspected) exposure to covid-19: Secondary | ICD-10-CM | POA: Diagnosis not present

## 2019-09-12 DIAGNOSIS — I071 Rheumatic tricuspid insufficiency: Secondary | ICD-10-CM | POA: Diagnosis not present

## 2019-09-12 DIAGNOSIS — Z7902 Long term (current) use of antithrombotics/antiplatelets: Secondary | ICD-10-CM | POA: Insufficient documentation

## 2019-09-12 DIAGNOSIS — Z79899 Other long term (current) drug therapy: Secondary | ICD-10-CM | POA: Insufficient documentation

## 2019-09-12 DIAGNOSIS — K219 Gastro-esophageal reflux disease without esophagitis: Secondary | ICD-10-CM | POA: Diagnosis not present

## 2019-09-12 DIAGNOSIS — N400 Enlarged prostate without lower urinary tract symptoms: Secondary | ICD-10-CM | POA: Diagnosis present

## 2019-09-12 DIAGNOSIS — I739 Peripheral vascular disease, unspecified: Secondary | ICD-10-CM | POA: Insufficient documentation

## 2019-09-12 DIAGNOSIS — I10 Essential (primary) hypertension: Secondary | ICD-10-CM | POA: Diagnosis present

## 2019-09-12 DIAGNOSIS — G459 Transient cerebral ischemic attack, unspecified: Secondary | ICD-10-CM | POA: Diagnosis present

## 2019-09-12 DIAGNOSIS — I6381 Other cerebral infarction due to occlusion or stenosis of small artery: Secondary | ICD-10-CM | POA: Diagnosis not present

## 2019-09-12 DIAGNOSIS — Z8546 Personal history of malignant neoplasm of prostate: Secondary | ICD-10-CM | POA: Insufficient documentation

## 2019-09-12 LAB — URINE DRUG SCREEN, QUALITATIVE (ARMC ONLY)
Amphetamines, Ur Screen: NOT DETECTED
Barbiturates, Ur Screen: NOT DETECTED
Benzodiazepine, Ur Scrn: NOT DETECTED
Cannabinoid 50 Ng, Ur ~~LOC~~: NOT DETECTED
Cocaine Metabolite,Ur ~~LOC~~: NOT DETECTED
MDMA (Ecstasy)Ur Screen: NOT DETECTED
Methadone Scn, Ur: NOT DETECTED
Opiate, Ur Screen: NOT DETECTED
Phencyclidine (PCP) Ur S: NOT DETECTED
Tricyclic, Ur Screen: NOT DETECTED

## 2019-09-12 LAB — CBC
HCT: 43.2 % (ref 39.0–52.0)
Hemoglobin: 13.8 g/dL (ref 13.0–17.0)
MCH: 30.1 pg (ref 26.0–34.0)
MCHC: 31.9 g/dL (ref 30.0–36.0)
MCV: 94.1 fL (ref 80.0–100.0)
Platelets: 237 10*3/uL (ref 150–400)
RBC: 4.59 MIL/uL (ref 4.22–5.81)
RDW: 13.7 % (ref 11.5–15.5)
WBC: 8.9 10*3/uL (ref 4.0–10.5)
nRBC: 0 % (ref 0.0–0.2)

## 2019-09-12 LAB — COMPREHENSIVE METABOLIC PANEL
ALT: 11 U/L (ref 0–44)
AST: 20 U/L (ref 15–41)
Albumin: 4.2 g/dL (ref 3.5–5.0)
Alkaline Phosphatase: 68 U/L (ref 38–126)
Anion gap: 12 (ref 5–15)
BUN: 15 mg/dL (ref 8–23)
CO2: 22 mmol/L (ref 22–32)
Calcium: 9.5 mg/dL (ref 8.9–10.3)
Chloride: 103 mmol/L (ref 98–111)
Creatinine, Ser: 0.94 mg/dL (ref 0.61–1.24)
GFR calc Af Amer: >60 mL/min (ref >60)
GFR calc non Af Amer: >60 mL/min (ref >60)
Glucose, Bld: 108 mg/dL — ABNORMAL HIGH (ref 70–99)
Potassium: 3.8 mmol/L (ref 3.5–5.1)
Sodium: 137 mmol/L (ref 135–145)
Total Bilirubin: 0.9 mg/dL (ref 0.3–1.2)
Total Protein: 7.2 g/dL (ref 6.5–8.1)

## 2019-09-12 LAB — URINALYSIS, ROUTINE W REFLEX MICROSCOPIC
Bilirubin Urine: NEGATIVE
Glucose, UA: NEGATIVE mg/dL
Hgb urine dipstick: NEGATIVE
Ketones, ur: NEGATIVE mg/dL
Leukocytes,Ua: NEGATIVE
Nitrite: NEGATIVE
Protein, ur: NEGATIVE mg/dL
Specific Gravity, Urine: 1.004 — ABNORMAL LOW (ref 1.005–1.030)
pH: 6 (ref 5.0–8.0)

## 2019-09-12 LAB — DIFFERENTIAL
Abs Immature Granulocytes: 0.03 10*3/uL (ref 0.00–0.07)
Basophils Absolute: 0.1 10*3/uL (ref 0.0–0.1)
Basophils Relative: 1 %
Eosinophils Absolute: 0.2 10*3/uL (ref 0.0–0.5)
Eosinophils Relative: 3 %
Immature Granulocytes: 0 %
Lymphocytes Relative: 37 %
Lymphs Abs: 3.3 10*3/uL (ref 0.7–4.0)
Monocytes Absolute: 0.7 10*3/uL (ref 0.1–1.0)
Monocytes Relative: 8 %
Neutro Abs: 4.6 10*3/uL (ref 1.7–7.7)
Neutrophils Relative %: 51 %

## 2019-09-12 LAB — GLUCOSE, CAPILLARY: Glucose-Capillary: 103 mg/dL — ABNORMAL HIGH (ref 70–99)

## 2019-09-12 LAB — PROTIME-INR
INR: 1.3 — ABNORMAL HIGH (ref 0.8–1.2)
Prothrombin Time: 15.6 seconds — ABNORMAL HIGH (ref 11.4–15.2)

## 2019-09-12 LAB — APTT: aPTT: 29 seconds (ref 24–36)

## 2019-09-12 MED ORDER — ASPIRIN 81 MG PO CHEW
324.0000 mg | CHEWABLE_TABLET | Freq: Once | ORAL | Status: AC
  Administered 2019-09-12: 11:00:00 324 mg via ORAL
  Filled 2019-09-12: qty 4

## 2019-09-12 MED ORDER — METOPROLOL TARTRATE 25 MG PO TABS: 25.0000 mg | ORAL_TABLET | Freq: Every day | ORAL | Status: DC

## 2019-09-12 MED ORDER — STROKE: EARLY STAGES OF RECOVERY BOOK: Freq: Once | Status: AC

## 2019-09-12 MED ORDER — ATORVASTATIN CALCIUM 20 MG PO TABS: 40.0000 mg | ORAL_TABLET | Freq: Every day | ORAL | Status: DC

## 2019-09-12 MED ORDER — ASPIRIN 81 MG PO CHEW
324.0000 mg | CHEWABLE_TABLET | Freq: Once | ORAL | Status: AC
  Administered 2019-09-13: 324 mg via ORAL
  Filled 2019-09-12: qty 4

## 2019-09-12 MED ORDER — ACETAMINOPHEN 325 MG PO TABS: 650.0000 mg | ORAL_TABLET | Freq: Four times a day (QID) | ORAL | Status: DC | PRN

## 2019-09-12 MED ORDER — PANTOPRAZOLE SODIUM 40 MG PO TBEC
40.0000 mg | DELAYED_RELEASE_TABLET | Freq: Every day | ORAL | Status: DC
  Administered 2019-09-12 – 2019-09-13 (×2): 40 mg via ORAL
  Filled 2019-09-12 (×2): qty 1

## 2019-09-12 MED ORDER — TAMSULOSIN HCL 0.4 MG PO CAPS
0.4000 mg | ORAL_CAPSULE | Freq: Every day | ORAL | Status: DC
  Administered 2019-09-13: 08:00:00 0.4 mg via ORAL
  Filled 2019-09-12: qty 1

## 2019-09-12 MED ORDER — ONDANSETRON HCL 4 MG/2ML IJ SOLN: 4.0000 mg | Freq: Three times a day (TID) | INTRAMUSCULAR | Status: DC | PRN

## 2019-09-12 MED ORDER — ENOXAPARIN SODIUM 40 MG/0.4ML ~~LOC~~ SOLN
40.0000 mg | SUBCUTANEOUS | Status: DC
  Administered 2019-09-12: 15:00:00 40 mg via SUBCUTANEOUS
  Filled 2019-09-12: qty 0.4

## 2019-09-12 MED ORDER — HYDRALAZINE HCL 20 MG/ML IJ SOLN
5.0000 mg | INTRAMUSCULAR | Status: DC | PRN
  Filled 2019-09-12: qty 0.25

## 2019-09-12 MED ORDER — ATORVASTATIN CALCIUM 20 MG PO TABS
80.0000 mg | ORAL_TABLET | Freq: Every day | ORAL | Status: DC
  Administered 2019-09-12: 80 mg via ORAL
  Filled 2019-09-12: qty 4

## 2019-09-12 MED ORDER — SENNOSIDES-DOCUSATE SODIUM 8.6-50 MG PO TABS: 1.0000 | ORAL_TABLET | Freq: Every evening | ORAL | Status: DC | PRN

## 2019-09-12 MED ORDER — LISINOPRIL 10 MG PO TABS: 10.0000 mg | ORAL_TABLET | Freq: Every day | ORAL | Status: DC

## 2019-09-12 NOTE — Progress Notes (Signed)
PT Cancellation Note  Patient Details Name: Todd Trujillo. MRN: [id number] DOB: 07-13-1930   Cancelled Treatment:    Reason Eval/Treat Not Completed: Patient at procedure or test/unavailable Patient unavailble at imaging x2 attempts  Shelton Silvas PT, DPT Shelton Silvas 09/12/2019, 2:25 PM

## 2019-09-12 NOTE — Progress Notes (Signed)
Verbal order from MD to perform NIH Q4.

## 2019-09-12 NOTE — ED Provider Notes (Signed)
[facility] Emergency Department Provider Note   ____________________________________________    I have reviewed the triage vital signs and the nursing notes.   HISTORY  Chief Complaint Numbness and Gait Problem     HPI Todd Trujillo. is a 86 y.o. male who presents with complaints of left facial numbness which started yesterday evening.  This morning he noted when he got up that he was stumbling slightly when he walked.  Denies a weakness in his leg however he feels uncoordinated.  Denies headache.  No nausea or vomiting.  No history of stroke.  Does take medication for blood pressure.  No visual changes.  Not on blood thinners.  Past Medical History:  Diagnosis Date  . Arthritis   . Cancer [facility])    prostate  . Hyperlipidemia   . Hypertension   . Left wrist fracture   . Macular degeneration of right eye   . Nephrolithiasis   . Prostate CA ([place]) 2003  . Prostate troubles    Patient was unsure of term    Patient Active Problem List   Diagnosis Date Noted  . Left facial numbness 09/12/2019  . HTN (hypertension) 09/12/2019  . GERD (gastroesophageal reflux disease) 09/12/2019  . Arthritis 08/13/2018  . Benign prostatic hyperplasia 08/13/2018  . Diverticulosis 08/13/2018  . History of anemia 08/13/2018  . History of colonic polyps 08/13/2018  . History of nephrolithiasis 08/13/2018  . History of pneumonia 08/13/2018  . Weight loss 12/15/2016  . Healthcare maintenance 12/14/2016  . Skin lesion of chest wall 09/23/2016  . Anemia 09/17/2016  . Hypercholesterolemia 09/17/2016  . Incomplete emptying of bladder 02/02/2016  . TIA (transient ischemic attack) 01/08/2016  . Macular degeneration 01/08/2016  . History of prostate cancer 01/08/2016  . PVD (peripheral vascular disease) ([place]) 10/12/2015  . SCC (squamous cell carcinoma) 01/24/2015  . Bladder outlet obstruction 04/15/2013  . Acquired cyst of kidney 04/16/2012  . ED  (erectile dysfunction) of organic origin 04/16/2012  . Malignant neoplasm of prostate ([place]) 04/16/2012    Past Surgical History:  Procedure Laterality Date  . TOTAL KNEE ARTHROPLASTY      Prior to Admission medications   Medication Sig Start Date End Date Taking? Authorizing Provider  lisinopril (PRINIVIL,ZESTRIL) 20 MG tablet Take 20 mg by mouth daily.  06/09/18   [provider]  metoprolol tartrate (LOPRESSOR) 25 MG tablet TAKE ONE TABLET EVERY MORNING Patient taking differently: Take 25 mg by mouth daily at 6 (six) AM.  10/31/17   Einar Pheasant, MD  omeprazole (PRILOSEC) 20 MG capsule Take 20 mg by mouth daily.  08/16/18   [provider]  tamsulosin (FLOMAX) 0.4 MG CAPS capsule Take 0.4 mg by mouth daily. 10/12/15   [provider]     Allergies Codeine  Family History  Problem Relation Age of Onset  . Colon cancer Mother     Social History Social History   Tobacco Use  . Smoking status: Never Smoker  . Smokeless tobacco: Never Used  Substance Use Topics  . Alcohol use: No  . Drug use: Not Currently    Review of Systems  Constitutional: No fever/chills Eyes: No visual changes.  ENT: No sore throat. Cardiovascular: Denies chest pain. Respiratory: Denies shortness of breath. Gastrointestinal: No abdominal pain.  Genitourinary: Negative for dysuria. Musculoskeletal: Negative for back pain. Skin: Negative for rash. Neurological: As above   ____________________________________________   PHYSICAL EXAM:  VITAL SIGNS: ED Triage Vitals  Enc Vitals Group  BP 09/12/19 1001 (!) 143/68     Pulse Rate 09/12/19 1001 73     Resp 09/12/19 1001 16     Temp 09/12/19 1001 97.8 F (36.6 C)     Temp Source 09/12/19 1001 Oral     SpO2 09/12/19 1001 99 %     Weight 09/12/19 1002 71.7 kg (158 lb)     Height 09/12/19 1002 1.803 m (5\' 11" )     Head Circumference --      Peak Flow --      Pain Score 09/12/19 1002 0     Pain Loc --      Pain  Edu? --      Excl. in [place]? --     Constitutional: Alert and oriented. Eyes: Conjunctivae are normal.  Head: Atraumatic. Nose: No congestion/rhinnorhea. Mouth/Throat: Mucous membranes are moist.   Neck:  Painless ROM Cardiovascular: Normal rate, regular rhythm. Grossly normal heart sounds.  Good peripheral circulation. Respiratory: Normal respiratory effort.  No retractions. Lungs CTAB. Gastrointestinal: Soft and nontender. No distention.  No CVA tenderness.  Musculoskeletal: Normal strength in all extremities. Neurologic:  Normal speech and language.  Left lower facial droop, mild forehead spared.  Strength appears normal in all extremities.  No dysdiadochokinesis, no pronator drift.  Normal speech Skin:  Skin is warm, dry and intact. No rash noted. Psychiatric: Mood and affect are normal.   ____________________________________________   LABS (all labs ordered are listed, but only abnormal results are displayed)  Labs Reviewed  PROTIME-INR - Abnormal; Notable for the following components:      Result Value   Prothrombin Time 15.6 (*)    INR 1.3 (*)    All other components within normal limits  COMPREHENSIVE METABOLIC PANEL - Abnormal; Notable for the following components:   Glucose, Bld 108 (*)    All other components within normal limits  URINALYSIS, ROUTINE W REFLEX MICROSCOPIC - Abnormal; Notable for the following components:   Color, Urine STRAW (*)    APPearance CLEAR (*)    Specific Gravity, Urine 1.004 (*)    All other components within normal limits  GLUCOSE, CAPILLARY - Abnormal; Notable for the following components:   Glucose-Capillary 103 (*)    All other components within normal limits  SARS CORONAVIRUS 2 (TAT 6-24 HRS)  APTT  CBC  DIFFERENTIAL  URINE DRUG SCREEN, QUALITATIVE ([facility] ONLY)   ____________________________________________  EKG  ED ECG REPORT I, Lavonia Drafts, the attending physician, personally viewed and interpreted this ECG.  Date:  09/12/2019  Rhythm: normal sinus rhythm QRS Axis: normal Intervals: normal ST/T Wave abnormalities: normal Narrative Interpretation: no evidence of acute ischemia  ____________________________________________  RADIOLOGY  CT head unremarkable ____________________________________________   PROCEDURES  Procedure(s) performed: No  Procedures   Critical Care performed: No ____________________________________________   INITIAL IMPRESSION / ASSESSMENT AND PLAN / ED COURSE  Pertinent labs & imaging results that were available during my care of the patient were reviewed by me and considered in my medical decision making (see chart for details).  Patient presents with left sided facial droop, stumbling with ambulation.  Symptoms started yesterday evening.  NIH stroke scale of 2.  Outside of the window for TPA and mild symptoms only.  I have ordered swallow study CT head, labs.  Patient's lab work is overall reassuring, CT head is unremarkable, however strongly suspicious for stroke, will admit to the hospitalist service for further evaluation, MRI   ____________________________________________   FINAL CLINICAL IMPRESSION(S) / ED DIAGNOSES  Final  diagnoses:  Cerebrovascular accident (CVA), unspecified mechanism ([place])        Note:  This document was prepared using Dragon voice recognition software and may include unintentional dictation errors.   Lavonia Drafts, MD 09/12/19 1114

## 2019-09-12 NOTE — ED Notes (Signed)
Pt given urinal upon request 

## 2019-09-12 NOTE — H&P (Addendum)
History and Physical    Todd Trujillo. [id number] DOB: 31-Jul-1929 DOA: 09/12/2019  Referring MD/NP/PA:   PCP: Einar Pheasant, MD   Patient coming from:  The patient is coming from home.  At baseline, pt is independent for most of ADL.        Chief Complaint: left facial numbness  HPI: Todd Trujillo. is a 86 y.o. male with medical history significant of hypertension, hyperlipidemia, TIA, GERD, prostate cancer, BPH, kidney stone, PVD, diverticulosis, who presents with left facial numbness and gait problem.  Per her daughter (I called her daughter by phone), pt developed left facial numbness at about 18:00 yesterday. This morning, pt he was stumbling slightly when he walked. No slurred speech or vision loss.  Patient denies unilateral arm weakness in extremities, but stating that he feel abnormal in his left leg.  Patient does not have chest pain, shortness breath, cough.  No fever or chills.  No nausea vomiting, diarrhea, abdominal pain, symptoms of UTI.  ED Course: pt was found to have WBC 8.9, INR 1.3, PTT 29, pending COVID-19 test, electrolytes renal function okay.  Temperature normal, blood pressure 143/68, heart rate 73, RR 16, oxygen saturation 99% on room air.  CT of the head is negative for acute intracranial abnormalities.  Patient is placed on MedSurg bed for observation.  Review of Systems:   General: no fevers, chills, no body weight gain, has fatigue HEENT: no blurry vision, hearing changes or sore throat Respiratory: no dyspnea, coughing, wheezing CV: no chest pain, no palpitations GI: no nausea, vomiting, abdominal pain, diarrhea, constipation GU: no dysuria, burning on urination, increased urinary frequency, hematuria  Ext: no leg edema Neuro: no vision change or hearing loss. Has left facial numbness and gain problem Skin: no rash, no skin tear. MSK: No muscle spasm, no deformity, no limitation of range of movement in spin Heme: No easy  bruising.  Travel history: No recent long distant travel.  Allergy:  Allergies  Allergen Reactions  . Codeine Nausea And Vomiting    Past Medical History:  Diagnosis Date  . Arthritis   . Cancer [facility])    prostate  . Hyperlipidemia   . Hypertension   . Left wrist fracture   . Macular degeneration of right eye   . Nephrolithiasis   . Prostate CA ([place]) 2003  . Prostate troubles    Patient was unsure of term    Past Surgical History:  Procedure Laterality Date  . TOTAL KNEE ARTHROPLASTY      Social History:  reports that he has never smoked. He has never used smokeless tobacco. He reports previous drug use. He reports that he does not drink alcohol.  Family History:  Family History  Problem Relation Age of Onset  . Colon cancer Mother      Prior to Admission medications   Medication Sig Start Date End Date Taking? Authorizing Provider  lisinopril (PRINIVIL,ZESTRIL) 20 MG tablet Take 20 mg by mouth daily.  06/09/18   [provider]  metoprolol tartrate (LOPRESSOR) 25 MG tablet TAKE ONE TABLET EVERY MORNING Patient taking differently: Take 25 mg by mouth daily at 6 (six) AM.  10/31/17   Einar Pheasant, MD  omeprazole (PRILOSEC) 20 MG capsule Take 20 mg by mouth daily.  08/16/18   [provider]  tamsulosin (FLOMAX) 0.4 MG CAPS capsule Take 0.4 mg by mouth daily. 10/12/15   [provider]    Physical Exam: Vitals:   09/12/19 1001 09/12/19 1002  BP: (!) 143/68   Pulse: 73   Resp: 16   Temp: 97.8 F (36.6 C)   TempSrc: Oral   SpO2: 99%   Weight:  71.7 kg  Height:  5\' 11"  (1.803 m)   General: Not in acute distress HEENT:       Eyes: PERRL, EOMI, no scleral icterus.       ENT: No discharge from the ears and nose, no pharynx injection, no tonsillar enlargement.        Neck: No JVD, no bruit, no mass felt. Heme: No neck lymph node enlargement. Cardiac: S1/S2, RRR, No murmurs, No gallops or rubs. Respiratory:  No rales, wheezing, rhonchi  or rubs. GI: Soft, nondistended, nontender, no rebound pain, no organomegaly, BS present. GU: No hematuria Ext: No pitting leg edema bilaterally. 2+DP/PT pulse bilaterally. Musculoskeletal: No joint deformities, No joint redness or warmth, no limitation of ROM in spin. Skin: No rashes.  Neuro: Alert, oriented X3, cranial nerves II-XII grossly intact, moves all extremities normally. Muscle strength 5/5 in all extremities, sensation to light touch intact. Brachial reflex 2+ bilaterally. Negative Babinski's sign.  Psych: Patient is not psychotic, no suicidal or hemocidal ideation.  Labs on Admission: I have personally reviewed following labs and imaging studies  CBC: Recent Labs  Lab 09/12/19 1015  WBC 8.9  NEUTROABS 4.6  HGB 13.8  HCT 43.2  MCV 94.1  PLT 123XX123   Basic Metabolic Panel: Recent Labs  Lab 09/12/19 1015  NA 137  K 3.8  CL 103  CO2 22  GLUCOSE 108*  BUN 15  CREATININE 0.94  CALCIUM 9.5   GFR: Estimated Creatinine Clearance: 56.1 mL/min (by C-G formula based on SCr of 0.94 mg/dL). Liver Function Tests: Recent Labs  Lab 09/12/19 1015  AST 20  ALT 11  ALKPHOS 68  BILITOT 0.9  PROT 7.2  ALBUMIN 4.2   No results for input(s): LIPASE, AMYLASE in the last 168 hours. No results for input(s): AMMONIA in the last 168 hours. Coagulation Profile: Recent Labs  Lab 09/12/19 1015  INR 1.3*   Cardiac Enzymes: No results for input(s): CKTOTAL, CKMB, CKMBINDEX, TROPONINI in the last 168 hours. BNP (last 3 results) No results for input(s): PROBNP in the last 8760 hours. HbA1C: No results for input(s): HGBA1C in the last 72 hours. CBG: Recent Labs  Lab 09/12/19 1019  GLUCAP 103*   Lipid Profile: No results for input(s): CHOL, HDL, LDLCALC, TRIG, CHOLHDL, LDLDIRECT in the last 72 hours. Thyroid Function Tests: No results for input(s): TSH, T4TOTAL, FREET4, T3FREE, THYROIDAB in the last 72 hours. Anemia Panel: No results for input(s): VITAMINB12, FOLATE,  FERRITIN, TIBC, IRON, RETICCTPCT in the last 72 hours. Urine analysis:    Component Value Date/Time   COLORURINE STRAW (A) 09/12/2019 1054   APPEARANCEUR CLEAR (A) 09/12/2019 1054   LABSPEC 1.004 (L) 09/12/2019 1054   PHURINE 6.0 09/12/2019 1054   GLUCOSEU NEGATIVE 09/12/2019 1054   HGBUR NEGATIVE 09/12/2019 1054   [place] 09/12/2019 1054   KETONESUR NEGATIVE 09/12/2019 1054   PROTEINUR NEGATIVE 09/12/2019 1054   NITRITE NEGATIVE 09/12/2019 1054   LEUKOCYTESUR NEGATIVE 09/12/2019 1054   Sepsis Labs: @LABRCNTIP (procalcitonin:4,lacticidven:4) )No results found for this or any previous visit (from the past 240 hour(s)).   Radiological Exams on Admission: CT HEAD WO CONTRAST  Result Date: 09/12/2019 CLINICAL DATA:  Left-sided numbness, starting last night. Difficulty walking. EXAM: CT HEAD WITHOUT CONTRAST TECHNIQUE: Contiguous axial images were obtained from the base of the skull through the vertex  without intravenous contrast. COMPARISON:  01/09/2016 brain MR. 01/08/2016 head CT. FINDINGS: Brain: Expected cerebral and cerebellar atrophy for age. Moderate low density in the periventricular white matter likely related to small vessel disease. No mass lesion, hemorrhage, hydrocephalus, acute infarct, intra-axial, or extra-axial fluid collection. Vascular: Intracranial atherosclerosis. Skull: Normal skull. Sinuses/Orbits: Normal imaged portions of the orbits and globes. Clear paranasal sinuses and mastoid air cells. Other: None. IMPRESSION: 1. No acute intracranial abnormality. 2. Cerebral atrophy and small vessel ischemic change. Electronically Signed   By: Abigail Miyamoto M.D.   On: 09/12/2019 10:43     EKG: Independently reviewed.  Sinus rhythm, QTC 421, poor R wave progression, borderline LAD.   Assessment/Plan Principal Problem:   Left facial numbness Active Problems:   TIA (transient ischemic attack)   Hypercholesterolemia   Benign prostatic hyperplasia   HTN  (hypertension)   GERD (gastroesophageal reflux disease)   Left facial numbness: pt also has gait abnormality. He feels abnormal in his left leg. CT-head negative. Suspecting stroke. Dr. Lorraine Lax of neuro is consulted.  - will place on tele bed for obs - will follow up Neurology's Recs.  - Obtain MRI/MRA  - Check carotid dopplers  - start ASA and Lipitor - fasting lipid panel and HbA1c  - 2D transthoracic echocardiography  - swallowing screen. If fails, will get SLP - PT/OT consult  Hx of TIA (transient ischemic attack): -ASA and lipitor is started  Hypercholesterolemia: -started lipitor  Benign prostatic hyperplasia: -flomax  HTN (hypertension): -hold home metoprolol and lisinopril -prn hydralazine for SBP>220 or dBP>120 to allow permissive hypertension  GERD (gastroesophageal reflux disease) -Protonix       DVT ppx:  SQ Lovenox Code Status: Full code Family Communication: Yes, patient's daughter by phone Disposition Plan:  Anticipate discharge back to previous home environment Consults called:  Dr. Lorraine Lax Admission status: Med-surg bed for obs  Date of Service 09/12/2019    [facility]   If 7PM-7AM, please contact night-coverage [email or web address] 09/12/2019, 12:00 PM

## 2019-09-12 NOTE — ED Notes (Signed)
Returned from CT scan.

## 2019-09-12 NOTE — Consult Note (Signed)
Requesting Physician: Dr. Blaine Hamper    Chief Complaint: Left-sided facial numbness   History obtained from: Patient and Chart    HPI:                                                                                                                                       Todd Trujillo. is a 86 y.o. male with past medical history significant for prostate cancer, hypertension, hyperlipidemia, T IA, peripheral vascular disease presents to the emergency department with left-sided facial numbness as well as gait difficulty.  Last known normal was 6 PM yesterday, patient noticed that his left side of his face became numb.  This morning he has been stumbling while walking and was brought to the hospital.  Work-up in the ED included CT head which was unremarkable.  Date last known well: 2.26-21 Time last known well: 6 PM tPA Given: No, outside TPA window NIHSS: 3  Baseline MRS 1   Past Medical History:  Diagnosis Date  . Arthritis   . Cancer [facility])    prostate  . Hyperlipidemia   . Hypertension   . Left wrist fracture   . Macular degeneration of right eye   . Nephrolithiasis   . Prostate CA ([place]) 2003  . Prostate troubles    Patient was unsure of term    Past Surgical History:  Procedure Laterality Date  . TOTAL KNEE ARTHROPLASTY      Family History  Problem Relation Age of Onset  . Colon cancer Mother    Social History:  reports that he has never smoked. He has never used smokeless tobacco. He reports previous drug use. He reports that he does not drink alcohol.  Allergies:  Allergies  Allergen Reactions  . Codeine Nausea And Vomiting    Medications:                                                                                                                        I reviewed home medications   ROS:  14 systems reviewed and negative  except above   Examination:                                                                                                      General: Appears well-developed  Psych: Affect appropriate to situation Eyes: No scleral injection HENT: No OP obstrucion Head: Normocephalic.  Cardiovascular: Normal rate and regular rhythm.  Respiratory: Effort normal and breath sounds normal to anterior ascultation GI: Soft.  No distension. There is no tenderness.  Skin: WDI    Neurological Examination Mental Status: Alert, oriented, thought content appropriate.  Speech fluent without evidence of aphasia. Able to follow 3 step commands without difficulty. Cranial Nerves: II: Visual fields grossly normal,  III,IV, VI: ptosis not present, extra-ocular motions intact bilaterally, pupils equal, round, reactive to light and accommodation V,VII: mild Left NF flattening , facial light touch sensation normal bilaterally VIII: hearing normal bilaterally IX,X: uvula rises symmetrically XI: bilateral shoulder shrug XII: midline tongue extension Motor: Right : Upper extremity   5/5    Left:     Upper extremity   5/5  Lower extremity   5/5     Lower extremity   5/5 Tone and bulk:normal tone throughout; no atrophy noted Sensory: Pinprick and light touch intact throughout, bilaterally Deep Tendon Reflexes: 2+ and symmetric throughout Plantars: Right: downgoing   Left: downgoing Cerebellar: impaired finger to nose and heel to shin on left side      Lab Results: Basic Metabolic Panel: Recent Labs  Lab 09/12/19 1015  NA 137  K 3.8  CL 103  CO2 22  GLUCOSE 108*  BUN 15  CREATININE 0.94  CALCIUM 9.5    CBC: Recent Labs  Lab 09/12/19 1015  WBC 8.9  NEUTROABS 4.6  HGB 13.8  HCT 43.2  MCV 94.1  PLT 237    Coagulation Studies: Recent Labs    09/12/19 1015  LABPROT 15.6*  INR 1.3*    Imaging: CT HEAD WO CONTRAST  Result Date: 09/12/2019 CLINICAL DATA:  Left-sided numbness, starting  last night. Difficulty walking. EXAM: CT HEAD WITHOUT CONTRAST TECHNIQUE: Contiguous axial images were obtained from the base of the skull through the vertex without intravenous contrast. COMPARISON:  01/09/2016 brain MR. 01/08/2016 head CT. FINDINGS: Brain: Expected cerebral and cerebellar atrophy for age. Moderate low density in the periventricular white matter likely related to small vessel disease. No mass lesion, hemorrhage, hydrocephalus, acute infarct, intra-axial, or extra-axial fluid collection. Vascular: Intracranial atherosclerosis. Skull: Normal skull. Sinuses/Orbits: Normal imaged portions of the orbits and globes. Clear paranasal sinuses and mastoid air cells. Other: None. IMPRESSION: 1. No acute intracranial abnormality. 2. Cerebral atrophy and small vessel ischemic change. Electronically Signed   By: Abigail Miyamoto M.D.   On: 09/12/2019 10:43     ASSESSMENT AND PLAN   86 y.o. male with past medical history significant for prostate cancer, hypertension, hyperlipidemia, T IA, peripheral vascular disease presents to the emergency department with left-sided facial numbness as well as gait difficulty.  On exam, he has left side ataxia as well as mild facial  droop, suspect pontine/basal ganglia infarction.   Acute ischemic stroke Left hemi-ataxia  Recommend # MRI of the brain without contrast #MRA Head and carotid dopplers  #Transthoracic Echo  # Start patient on ASA 325mg  daily #Start or continue Atorvastatin 80 mg/other high intensity statin # BP goal: permissive HTN upto 220/120 mmHg ( 185/110 if patient has CHF, CKD) # HBAIC and Lipid profile # Telemetry monitoring # Frequent neuro checks # NPO until passes stroke swallow screen   Skylin Kennerson [facility] Pager Number [phone]

## 2019-09-12 NOTE — Progress Notes (Signed)
OT Cancellation Note  Patient Details Name: Todd Trujillo. MRN: [id number] DOB: 1930/03/11   Cancelled Treatment:    Reason Eval/Treat Not Completed: Patient at procedure or test/ unavailable  OT consult received and chart reviewed. Pt off floor for imaging at this time. Will f/u as able for OT evaluation. Thank you.  Gerrianne Scale, [place], OTR/L ascom [phone] 09/12/19, 2:31 PM

## 2019-09-12 NOTE — ED Notes (Signed)
Called and updated wife and daughter Almyra Free with pt permission.

## 2019-09-12 NOTE — ED Triage Notes (Signed)
Pt to ED via POV c/o numbness in the left side of his face that started last night. Pt states that he noticed symptoms last night around 1800 when he went to bed. Pt states that he is stumbling when walking as well. Pt states that the numbness is just in his face. Pt is A & O x 4. Pt is in NAD at this time.

## 2019-09-13 ENCOUNTER — Observation Stay (HOSPITAL_BASED_OUTPATIENT_CLINIC_OR_DEPARTMENT_OTHER)
Admit: 2019-09-13 | Discharge: 2019-09-13 | Disposition: A | Discharge status: Home / Self Care | Payer: Medicare Other | Attending: Internal Medicine | Admitting: Internal Medicine

## 2019-09-13 DIAGNOSIS — R200 Anesthesia of skin: Secondary | ICD-10-CM | POA: Diagnosis not present

## 2019-09-13 DIAGNOSIS — K219 Gastro-esophageal reflux disease without esophagitis: Secondary | ICD-10-CM | POA: Diagnosis not present

## 2019-09-13 DIAGNOSIS — I6389 Other cerebral infarction: Secondary | ICD-10-CM | POA: Diagnosis not present

## 2019-09-13 DIAGNOSIS — I6381 Other cerebral infarction due to occlusion or stenosis of small artery: Secondary | ICD-10-CM | POA: Diagnosis not present

## 2019-09-13 DIAGNOSIS — E7800 Pure hypercholesterolemia, unspecified: Secondary | ICD-10-CM

## 2019-09-13 DIAGNOSIS — I639 Cerebral infarction, unspecified: Secondary | ICD-10-CM

## 2019-09-13 DIAGNOSIS — I10 Essential (primary) hypertension: Secondary | ICD-10-CM | POA: Diagnosis not present

## 2019-09-13 LAB — LIPID PANEL
Cholesterol: 199 mg/dL (ref 0–200)
HDL: 47 mg/dL (ref >40)
LDL Cholesterol: 137 mg/dL — ABNORMAL HIGH (ref 0–99)
Total CHOL/HDL Ratio: 4.2 RATIO
Triglycerides: 75 mg/dL (ref <150)
VLDL: 15 mg/dL (ref 0–40)

## 2019-09-13 LAB — HEMOGLOBIN A1C
Hgb A1c MFr Bld: 5.5 % (ref 4.8–5.6)
Mean Plasma Glucose: 111.15 mg/dL

## 2019-09-13 LAB — ECHOCARDIOGRAM COMPLETE
Height: 71 in
Weight: 2528 oz

## 2019-09-13 LAB — SARS CORONAVIRUS 2 (TAT 6-24 HRS): SARS Coronavirus 2: NEGATIVE

## 2019-09-13 MED ORDER — CLOPIDOGREL BISULFATE 75 MG PO TABS
75.0000 mg | ORAL_TABLET | Freq: Every day | ORAL | Status: DC
  Administered 2019-09-13: 75 mg via ORAL
  Filled 2019-09-13: qty 1

## 2019-09-13 MED ORDER — CLOPIDOGREL BISULFATE 75 MG PO TABS: 75.0000 mg | ORAL_TABLET | Freq: Every day | ORAL | 0 refills | Status: AC

## 2019-09-13 MED ORDER — ATORVASTATIN CALCIUM 40 MG PO TABS: 40.0000 mg | ORAL_TABLET | Freq: Every day | ORAL | 1 refills | Status: DC

## 2019-09-13 NOTE — Plan of Care (Signed)
  Problem: Pain Managment: Goal: General experience of comfort will improve Outcome: Completed/Met   Problem: Skin Integrity: Goal: Risk for impaired skin integrity will decrease Outcome: Completed/Met

## 2019-09-13 NOTE — Progress Notes (Signed)
MD order received in [facility] to discharge pt home today; verbally reviewed AVS with pt, [facility] [place], [place] or Dr Nicki Reaper for follow up appt, pt verbalized that he already has an appointment scheduled for 09/15/2019; Rxs escribed to [place], pt verbalized that he will pick them up in the morning; out patient physical therapy services ordered, someone will be contacting them regarding appointment from the office, no questions voiced at this time; pt discharged by nursing to the medical mall entrance

## 2019-09-13 NOTE — Discharge Instructions (Addendum)
Pt advised to f/u with [facility] or Dr Nicki Reaper   Out-patient physical therapy

## 2019-09-13 NOTE — Progress Notes (Addendum)
Reason for consult: Stroke  Subjective: Patient is doing better, minimal left-sided ataxia.  Has been evaluated by physical therapy and eager to go home.    ROS: negative except above  Examination  Vital signs in last 24 hours: Temp:  [97.7 F (36.5 C)-98.4 F (36.9 C)] 98.4 F (36.9 C) (02/28 0823) Pulse Rate:  [57-80] 80 (02/28 0823) Resp:  [16-18] 18 (02/28 0823) BP: (120-160)/(64-87) 151/76 (02/28 0823) SpO2:  [96 %-100 %] 96 % (02/28 0823)  General: lying in bed CVS: pulse-normal rate and rhythm RS: breathing comfortably Extremities: normal   Neuro: MS: Alert, oriented, follows commands CN: pupils equal and reactive,  EOMI, face symmetric, tongue midline, normal sensation over face, Motor: 5/5 strength in all 4 extremities Coordination: Finger-to-nose ataxia in left upper extremity  Gait: not tested  Basic Metabolic Panel: Recent Labs  Lab 09/12/19 1015  NA 137  K 3.8  CL 103  CO2 22  GLUCOSE 108*  BUN 15  CREATININE 0.94  CALCIUM 9.5    CBC: Recent Labs  Lab 09/12/19 1015  WBC 8.9  NEUTROABS 4.6  HGB 13.8  HCT 43.2  MCV 94.1  PLT 237     Coagulation Studies: Recent Labs    09/12/19 1015  LABPROT 15.6*  INR 1.3*    Imaging Reviewed:  MRI Aaron Edelman  MRA head    ASSESSMENT AND PLAN 86 y.o.malewith past medical history significant for prostate cancer, hypertension, hyperlipidemia, T IA, peripheral vascular disease presents to the emergency department with left-sided facial numbness as well as gait difficulty.   Acute ischemic stroke Left hemi-ataxia  Workup inlcuded # MRI of the brain without contrast: Right thalamic acute infarction, chronic leftcerebellar infarcts.  Moderate small vessel disease.  Several scattered chronic microhemorrhages ( mild)  #MRA Head : Mild intracranial atherosclerotic disease #Transthoracic Echo: no shunt, thrombus, normal EF and LA size.  # HBAIC: 5.5 #Lipid profile: LDL 137 # Telemetry monitoring: No  A. fib detected  Recommendations #ASA 81 mg and Plavix 75 mg daily to 3 weeks, aspirin alone afterwards #Start or continue Atorvastatin 40 mg/other high intensity statin # BP goal: Normotension #Outpatient neurology follow-up, can be done at [facility] clinic or at the [facility] Pager Number [phone] For questions after 7pm please refer to AMION to reach the Neurologist on call

## 2019-09-13 NOTE — Discharge Summary (Addendum)
[place] [facility] [place] NAME: Todd Trujillo    MR#:  [id number]  DATE OF BIRTH:  [birth year]  DATE OF ADMISSION:  09/12/2019 ADMITTING PHYSICIAN: Ivor Costa, MD  DATE OF DISCHARGE: 09/13/2019  PRIMARY CARE PHYSICIAN: Einar Pheasant, MD    ADMISSION DIAGNOSIS:  Stroke [facility]) [I63.9] Left facial numbness [R20.0] Cerebrovascular accident (CVA), unspecified mechanism ([place]) [I63.9]  DISCHARGE DIAGNOSIS:  Acute right Thalamic infarct  SECONDARY DIAGNOSIS:   Past Medical History:  Diagnosis Date  . Arthritis   . Cancer [facility])    prostate  . Hyperlipidemia   . Hypertension   . Left wrist fracture   . Macular degeneration of right eye   . Nephrolithiasis   . Prostate CA ([place]) 2003  . Prostate troubles    Patient was unsure of term    HOSPITAL COURSE:   Todd Harne. is a 86 y.o. male with medical history significant of hypertension, hyperlipidemia, TIA, GERD, prostate cancer, BPH, kidney stone, PVD, diverticulosis, who presents with left facial numbness and gait problem  Acute Right Thalamic Infarct -pt presented with Left facial numbness and  gait abnormality. He feels abnormal in his left leg.  -CT-head negative.  -Dr. Lorraine Lax of neuro is consulted-- recommends aspirin plus Plavix for three weeks and thereafter aspirin alone -Lipitor 40 mg qhs - 2D transthoracic echocardiography Left Ventricle: Left ventricular ejection fraction, by estimation, is 60  to 65%. The left ventricle has normal function. The left ventricle has no  regional wall motion abnormalities. The left ventricular internal cavity  size was normal in size. There is  no left ventricular hypertrophy. Left ventricular diastolic parameters  are consistent with Grade I diastolic dysfunction (impaired relaxation).  Right Ventricle: The right ventricular size is normal. No increase in  right ventricular wall thickness. Right ventricular systolic function is   normal. There is moderately elevated pulmonary artery systolic pressure.  The tricuspid regurgitant velocity is  - PT/OT consult appreciated--recommends out pt PT  Hypercholesterolemia: -started lipitor  Benign prostatic hyperplasia: -flomax  HTN (hypertension): -resume from tomorrow metoprolol and lisinopril -prn hydralazine for SBP>220 or dBP>120 to allow permissive hypertension  GERD (gastroesophageal reflux disease) -Protonix  DVT ppx:  SQ Lovenox Code Status: Full code Family Communication: pt and wife in the room Disposition Plan:  d/c home otday Consults called:  Dr. Lorraine Lax  CONSULTS OBTAINED:    DRUG ALLERGIES:   Allergies  Allergen Reactions  . Codeine Nausea And Vomiting    DISCHARGE MEDICATIONS:   Allergies as of 09/13/2019      Reactions   Codeine Nausea And Vomiting      Medication List    TAKE these medications   atorvastatin 40 MG tablet Commonly known as: LIPITOR Take 1 tablet (40 mg total) by mouth daily at 6 PM.   clopidogrel 75 MG tablet Commonly known as: PLAVIX Take 1 tablet (75 mg total) by mouth daily for 21 days.   lisinopril 20 MG tablet Commonly known as: ZESTRIL Take 10 mg by mouth daily.   metoprolol tartrate 25 MG tablet Commonly known as: LOPRESSOR TAKE ONE TABLET EVERY MORNING What changed: when to take this   omeprazole 20 MG capsule Commonly known as: PRILOSEC Take 20 mg by mouth daily.   tamsulosin 0.4 MG Caps capsule Commonly known as: FLOMAX Take 0.4 mg by mouth daily.       If you experience worsening of your admission symptoms, develop shortness of breath, life threatening emergency, suicidal  or homicidal thoughts you must seek medical attention immediately by calling 911 or calling your MD immediately  if symptoms less severe.  You Must read complete instructions/literature along with all the possible adverse reactions/side effects for all the Medicines you take and that have been prescribed to you.  Take any new Medicines after you have completely understood and accept all the possible adverse reactions/side effects.   Please note  You were cared for by a hospitalist during your hospital stay. If you have any questions about your discharge medications or the care you received while you were in the hospital after you are discharged, you can call the unit and asked to speak with the hospitalist on call if the hospitalist that took care of you is not available. Once you are discharged, your primary care physician will handle any further medical issues. Please note that NO REFILLS for any discharge medications will be authorized once you are discharged, as it is imperative that you return to your primary care physician (or establish a relationship with a primary care physician if you do not have one) for your aftercare needs so that they can reassess your need for medications and monitor your lab values. Today   SUBJECTIVE  patient feels better. Ambulated with the physical therapist. Wife in the room. Ate good breakfast.   VITAL SIGNS:  Blood pressure (!) 151/76, pulse 80, temperature 98.4 F (36.9 C), temperature source Oral, resp. rate 18, height 5\' 11"  (1.803 m), weight 71.7 kg, SpO2 96 %.  I/O:    Intake/Output Summary (Last 24 hours) at 09/13/2019 1416 Last data filed at 09/13/2019 1341 Gross per 24 hour  Intake 600 ml  Output 800 ml  Net -200 ml    PHYSICAL EXAMINATION:  GENERAL:  86 y.o.-year-old patient lying in the bed with no acute distress.  EYES: Pupils equal, round, reactive to light and accommodation. No scleral icterus.  HEENT: Head atraumatic, normocephalic. Oropharynx and nasopharynx clear.  NECK:  Supple, no jugular venous distention. No thyroid enlargement, no tenderness.  LUNGS: Normal breath sounds bilaterally, no wheezing, rales,rhonchi or crepitation. No use of accessory muscles of respiration.  CARDIOVASCULAR: S1, S2 normal. No murmurs, rubs, or gallops.   ABDOMEN: Soft, non-tender, non-distended. Bowel sounds present. No organomegaly or mass.  EXTREMITIES: No pedal edema, cyanosis, or clubbing.  NEUROLOGIC: Cranial nerves II through XII are intact. Muscle strength 5/5 in all extremities. Sensation intact. Gait not checked.  PSYCHIATRIC: The patient is alert and oriented x 3.  SKIN: No obvious rash, lesion, or ulcer.   DATA REVIEW:   CBC  Recent Labs  Lab 09/12/19 1015  WBC 8.9  HGB 13.8  HCT 43.2  PLT 237    Chemistries  Recent Labs  Lab 09/12/19 1015  NA 137  K 3.8  CL 103  CO2 22  GLUCOSE 108*  BUN 15  CREATININE 0.94  CALCIUM 9.5  AST 20  ALT 11  ALKPHOS 68  BILITOT 0.9    Microbiology Results   Recent Results (from the past 240 hour(s))  SARS CORONAVIRUS 2 (TAT 6-24 HRS) Nasopharyngeal Nasopharyngeal Swab     Status: None   Collection Time: 09/12/19 11:15 AM   Specimen: Nasopharyngeal Swab  Result Value Ref Range Status   SARS Coronavirus 2 NEGATIVE NEGATIVE Final    Comment: (NOTE) SARS-CoV-2 target nucleic acids are NOT DETECTED. The SARS-CoV-2 RNA is generally detectable in upper and lower respiratory specimens during the acute phase of infection. Negative results do not preclude  SARS-CoV-2 infection, do not rule out co-infections with other pathogens, and should not be used as the sole basis for treatment or other patient management decisions. Negative results must be combined with clinical observations, patient history, and epidemiological information. The expected result is Negative. Fact Sheet for Patients: SugarRoll.be Fact Sheet for Healthcare Providers: https://www.woods-mathews.com/ This test is not yet approved or cleared by the [place] FDA and  has been authorized for detection and/or diagnosis of SARS-CoV-2 by FDA under an Emergency Use Authorization (EUA). This EUA will remain  in effect (meaning this test can be used) for the duration of  the COVID-19 declaration under Section 56 4(b)(1) of the Act, 21 U.S.C. section 360bbb-3(b)(1), unless the authorization is terminated or revoked sooner. Performed at [facility], [place] [address], [place], [place] [address]     RADIOLOGY:  CT HEAD WO CONTRAST  Result Date: 09/12/2019 CLINICAL DATA:  Left-sided numbness, starting last night. Difficulty walking. EXAM: CT HEAD WITHOUT CONTRAST TECHNIQUE: Contiguous axial images were obtained from the base of the skull through the vertex without intravenous contrast. COMPARISON:  01/09/2016 brain MR. 01/08/2016 head CT. FINDINGS: Brain: Expected cerebral and cerebellar atrophy for age. Moderate low density in the periventricular white matter likely related to small vessel disease. No mass lesion, hemorrhage, hydrocephalus, acute infarct, intra-axial, or extra-axial fluid collection. Vascular: Intracranial atherosclerosis. Skull: Normal skull. Sinuses/Orbits: Normal imaged portions of the orbits and globes. Clear paranasal sinuses and mastoid air cells. Other: None. IMPRESSION: 1. No acute intracranial abnormality. 2. Cerebral atrophy and small vessel ischemic change. Electronically Signed   By: Abigail Miyamoto M.D.   On: 09/12/2019 10:43   MR ANGIO HEAD WO CONTRAST  Result Date: 09/12/2019 CLINICAL DATA:  Left facial numbness beginning yesterday. Imbalance/stumbling. EXAM: MRI HEAD WITHOUT CONTRAST MRA HEAD WITHOUT CONTRAST TECHNIQUE: Multiplanar, multiecho pulse sequences of the brain and surrounding structures were obtained without intravenous contrast. Angiographic images of the head were obtained using MRA technique without contrast. COMPARISON:  Head CT 09/12/2019, MRI 01/09/2016, and CTA 01/08/2016 FINDINGS: MRI HEAD FINDINGS Brain: There is a 6 mm acute to early subacute infarct laterally in the right thalamus. Several scattered chronic microhemorrhages in both cerebral hemispheres were not apparent on the prior MRI although today's examination  includes susceptibility weighted imaging which is more sensitive for detection of chronic microhemorrhages. Patchy T2 hyperintensities in the cerebral white matter have mildly progressed from the prior MRI and are nonspecific but compatible with moderate chronic small vessel ischemic disease. Small clustered chronic infarcts in the left cerebellar hemisphere are new. There is moderate cerebral atrophy. No mass, midline shift, or extra-axial fluid collection is identified. Vascular: Major intracranial arterial flow voids are preserved. Intermittent flow void loss in the left sigmoid sinus and upper left internal jugular vein is favored to reflect slow flow. Skull and upper cervical spine: Unremarkable bone marrow signal. Sinuses/Orbits: Bilateral cataract extraction. Clear paranasal sinuses. Small left mastoid effusion. Other: None. MRA HEAD FINDINGS The visualized distal vertebral arteries are patent to the basilar with the left being moderately dominant. Patent bilateral PICA, right AICA, and bilateral SCA origins are visualized. A left AICA is not clearly identified. The basilar artery is widely patent. There are large right and medium-sized left posterior communicating arteries. Both PCAs are patent with asymmetric distal branch vessel irregularity and attenuation on the right but no evidence of significant proximal stenosis on either side. The internal carotid arteries are patent from skull base to carotid termini without evidence of significant stenosis.  ACAs and MCAs are patent with mild distal branch vessel irregularity but no evidence of proximal branch occlusion or significant proximal stenosis. No aneurysm is identified. IMPRESSION: 1. Acute to early subacute right thalamic infarct. 2. Moderate chronic small vessel ischemic disease. 3. Chronic left cerebellar infarcts. 4. Intracranial atherosclerosis involving the small vessels without large vessel occlusion or significant proximal stenosis. Electronically  Signed   By: Logan Bores M.D.   On: 09/12/2019 14:38   MR BRAIN WO CONTRAST  Result Date: 09/12/2019 CLINICAL DATA:  Left facial numbness beginning yesterday. Imbalance/stumbling. EXAM: MRI HEAD WITHOUT CONTRAST MRA HEAD WITHOUT CONTRAST TECHNIQUE: Multiplanar, multiecho pulse sequences of the brain and surrounding structures were obtained without intravenous contrast. Angiographic images of the head were obtained using MRA technique without contrast. COMPARISON:  Head CT 09/12/2019, MRI 01/09/2016, and CTA 01/08/2016 FINDINGS: MRI HEAD FINDINGS Brain: There is a 6 mm acute to early subacute infarct laterally in the right thalamus. Several scattered chronic microhemorrhages in both cerebral hemispheres were not apparent on the prior MRI although today's examination includes susceptibility weighted imaging which is more sensitive for detection of chronic microhemorrhages. Patchy T2 hyperintensities in the cerebral white matter have mildly progressed from the prior MRI and are nonspecific but compatible with moderate chronic small vessel ischemic disease. Small clustered chronic infarcts in the left cerebellar hemisphere are new. There is moderate cerebral atrophy. No mass, midline shift, or extra-axial fluid collection is identified. Vascular: Major intracranial arterial flow voids are preserved. Intermittent flow void loss in the left sigmoid sinus and upper left internal jugular vein is favored to reflect slow flow. Skull and upper cervical spine: Unremarkable bone marrow signal. Sinuses/Orbits: Bilateral cataract extraction. Clear paranasal sinuses. Small left mastoid effusion. Other: None. MRA HEAD FINDINGS The visualized distal vertebral arteries are patent to the basilar with the left being moderately dominant. Patent bilateral PICA, right AICA, and bilateral SCA origins are visualized. A left AICA is not clearly identified. The basilar artery is widely patent. There are large right and medium-sized left  posterior communicating arteries. Both PCAs are patent with asymmetric distal branch vessel irregularity and attenuation on the right but no evidence of significant proximal stenosis on either side. The internal carotid arteries are patent from skull base to carotid termini without evidence of significant stenosis. ACAs and MCAs are patent with mild distal branch vessel irregularity but no evidence of proximal branch occlusion or significant proximal stenosis. No aneurysm is identified. IMPRESSION: 1. Acute to early subacute right thalamic infarct. 2. Moderate chronic small vessel ischemic disease. 3. Chronic left cerebellar infarcts. 4. Intracranial atherosclerosis involving the small vessels without large vessel occlusion or significant proximal stenosis. Electronically Signed   By: Logan Bores M.D.   On: 09/12/2019 14:38   US Carotid Bilateral (at [facility] and [facility] only)  Result Date: 09/12/2019 CLINICAL DATA:  86 year old male with a history of stroke EXAM: BILATERAL CAROTID DUPLEX ULTRASOUND TECHNIQUE: Pearline Cables scale imaging, color Doppler and duplex ultrasound were performed of bilateral carotid and vertebral arteries in the neck. COMPARISON:  None. FINDINGS: Criteria: Quantification of carotid stenosis is based on velocity parameters that correlate the residual internal carotid diameter with NASCET-based stenosis levels, using the diameter of the distal internal carotid lumen as the denominator for stenosis measurement. The following velocity measurements were obtained: RIGHT ICA:  Systolic 91 cm/sec, Diastolic 16 cm/sec CCA:  72 cm/sec SYSTOLIC ICA/CCA RATIO:  1.3 ECA:  95 cm/sec LEFT ICA:  Systolic 66 cm/sec, Diastolic 9 cm/sec CCA:  80 cm/sec  SYSTOLIC ICA/CCA RATIO:  0.8 ECA:  92 cm/sec Right Brachial SBP: Not acquired Left Brachial SBP: Not acquired RIGHT CAROTID ARTERY: No significant calcifications of the right common carotid artery. Intermediate waveform maintained. Moderate heterogeneous and partially  calcified plaque at the right carotid bifurcation. Lumen shadowing present low resistance waveform of the right ICA. No significant tortuosity. RIGHT VERTEBRAL ARTERY: Antegrade flow with low resistance waveform. LEFT CAROTID ARTERY: No significant calcifications of the left common carotid artery. Intermediate waveform maintained. Moderate heterogeneous and partially calcified plaque at the left carotid bifurcation. Lumen shadowing present. Low resistance waveform of the left ICA. No significant tortuosity. LEFT VERTEBRAL ARTERY:  Antegrade flow with low resistance waveform. IMPRESSION: Color duplex indicates moderate heterogeneous and calcified plaque, with no hemodynamically significant stenosis by duplex criteria in the extracranial cerebrovascular circulation. Signed, Dulcy Fanny. Dellia Nims, RPVI [facility] Electronically Signed   By: Corrie Mckusick D.O.   On: 09/12/2019 15:04     CODE STATUS:     Code Status Orders  (From admission, onward)         Start     Ordered   09/12/19 1156  Full code  Continuous     09/12/19 1157        Code Status History    Date Active Date Inactive Code Status Order ID Comments User Context   01/09/2016 0015 01/09/2016 1852 Full Code [id number]  Quintella Baton, MD Inpatient   Advance Care Planning Activity       TOTAL TIME TAKING CARE OF THIS PATIENT: *40* minutes.    Fritzi Mandes M.D  [facility]    CC: Primary care physician; Einar Pheasant, MD

## 2019-09-13 NOTE — Plan of Care (Signed)
  Problem: Education: Goal: Knowledge of General Education information will improve Description: Including pain rating scale, medication(s)/side effects and non-pharmacologic comfort measures Outcome: Adequate for Discharge   Problem: Health Behavior/Discharge Planning: Goal: Ability to manage health-related needs will improve Outcome: Adequate for Discharge   Problem: Clinical Measurements: Goal: Ability to maintain clinical measurements within normal limits will improve Outcome: Adequate for Discharge Goal: Will remain free from infection Outcome: Adequate for Discharge Goal: Diagnostic test results will improve Outcome: Adequate for Discharge Goal: Respiratory complications will improve Outcome: Adequate for Discharge Goal: Cardiovascular complication will be avoided Outcome: Adequate for Discharge   Problem: Activity: Goal: Risk for activity intolerance will decrease Outcome: Adequate for Discharge   Problem: Nutrition: Goal: Adequate nutrition will be maintained Outcome: Adequate for Discharge   Problem: Coping: Goal: Level of anxiety will decrease Outcome: Adequate for Discharge   Problem: Elimination: Goal: Will not experience complications related to bowel motility Outcome: Adequate for Discharge Goal: Will not experience complications related to urinary retention Outcome: Adequate for Discharge   Problem: Safety: Goal: Ability to remain free from injury will improve Outcome: Adequate for Discharge   

## 2019-09-13 NOTE — Evaluation (Signed)
Physical Therapy Evaluation Patient Details Name: Todd Trujillo. MRN: [id number] DOB: 11-16-1929 Today's Date: 09/13/2019   History of Present Illness  pt is an 86 yo male presenting to ED with left sided facial numbness and gait difficulty, no speech or vision impairments reported, MRI showed right thalamic acute infarction, chronic left cerebellar infarcts and moderate small vessel disease. PMH includes prostate cancer, HTN, HLD, TIA, PVD  Clinical Impression  Pt is an 86 yo male admitted for above. Pt received sitting up in recliner and agreeable to participate with PT. Pt reports living with his wife, daughter and grandchild in a multi-level home with bed/bath on main level. Pt reports being independent with all ADLs/IADLs and no recent falls. Pt reporting all facial numbness has resolved and no visual or sensation changes. Pt required min guard assist for OOB mobility for safety. Pt initially ambulating without AD however noted some unsteadiness with pt often reaching for bed rails to steady. Pt provided with RW for further ambulation in hallway with improved steadiness and gait pattern noted. Pt encouraged to continue ambulating with RW for short term in order to decrease fall risk. Pt able to maintain standing balance without UE support with feet in narrow BOS and semi tandem stance ~20 sec however unable to maintain static standing balance in tandem stance. Pt performed standing LE therex at counter top (see below). Pt presents with dec strength and balance limiting functional mobility. Pt will benefit from further acute PT and recommendation for outpatient PT following hospital discharge to further improve deficits and decrease fall risk.     Follow Up Recommendations Outpatient PT    Equipment Recommendations  Rolling walker with 5" wheels    Recommendations for Other Services       Precautions / Restrictions Precautions Precautions: Fall Restrictions Weight Bearing  Restrictions: No      Mobility  Bed Mobility               General bed mobility comments: received sitting up in recliner and ended session in recliner  Transfers Overall transfer level: Needs assistance Equipment used: None Transfers: Sit to/from Stand Sit to Stand: Min guard         General transfer comment: min guard for safety with STS from recliner, mild unsteadiness  Ambulation/Gait Ambulation/Gait assistance: Min guard Gait Distance (Feet): 150 Feet Assistive device: Rolling walker (2 wheeled);None Gait Pattern/deviations: Step-through pattern;Decreased stride length Gait velocity: mild dec   General Gait Details: pt ambulated within room without AD with noted unsteadiness and pt reaching for bed rails to steady, pt then provided with RW with pt ambulating out into hallway, improved steadiness and gait pattern with RW, pt encouraged to cont ambulating with RW for temporary time to reduce fall risk  Stairs            Wheelchair Mobility    Modified Rankin (Stroke Patients Only)       Balance Overall balance assessment: Mild deficits observed, not formally tested;Needs assistance             Standing balance comment: able to maintain static standing balance without UE support with feet in narrow NBOS and semi tandem stance ~20-30 sec, unable to maintian tandem stance                             Pertinent Vitals/Pain Pain Assessment: No/denies pain    Home Living Family/patient expects to be discharged to:: Private residence  Living Arrangements: Spouse/significant other;Children;Other relatives(daughter and grandchild also live with pt) Available Help at Discharge: Family;Available 24 hours/day Type of Home: House Home Access: Stairs to enter Entrance Stairs-Rails: Right Entrance Stairs-Number of Steps: 2-3 Home Layout: Multi-level;Able to live on main level with bedroom/bathroom Home Equipment: Grab bars - tub/shower;Cane -  single point      Prior Function Level of Independence: Independent         Comments: pt reports ind with ADLs/IADLs, still driving, no recent falls, walks without AD     Hand Dominance        Extremity/Trunk Assessment   Upper Extremity Assessment Upper Extremity Assessment: Generalized weakness    Lower Extremity Assessment Lower Extremity Assessment: Generalized weakness(BLE strength grossly 4-/5)       Communication   Communication: No difficulties  Cognition Arousal/Alertness: Awake/alert Behavior During Therapy: WFL for tasks assessed/performed Overall Cognitive Status: Within Functional Limits for tasks assessed                                        General Comments General comments (skin integrity, edema, etc.): HR 101 after ambulation, standing balance and therex    Exercises Other Exercises Other Exercises: standing LE therex for strengthening at counter top with UE support x10 reps heel raises, hip abd, standing knee flexion   Assessment/Plan    PT Assessment Patient needs continued PT services  PT Problem List Decreased strength;Decreased mobility;Decreased safety awareness;Decreased range of motion;Decreased activity tolerance;Decreased balance;Decreased knowledge of use of DME       PT Treatment Interventions DME instruction;Therapeutic exercise;Gait training;Balance training;Stair training;Neuromuscular re-education;Functional mobility training;Therapeutic activities;Patient/family education    PT Goals (Current goals can be found in the Care Plan section)  Acute Rehab PT Goals Patient Stated Goal: go home PT Goal Formulation: With patient Time For Goal Achievement: 09/27/19 Potential to Achieve Goals: Good    Frequency 7X/week   Barriers to discharge        Co-evaluation               AM-PAC PT "6 Clicks" Mobility  Outcome Measure Help needed turning from your back to your side while in a flat bed without using  bedrails?: A Little Help needed moving from lying on your back to sitting on the side of a flat bed without using bedrails?: A Little Help needed moving to and from a bed to a chair (including a wheelchair)?: A Little Help needed standing up from a chair using your arms (e.g., wheelchair or bedside chair)?: A Little Help needed to walk in hospital room?: A Little Help needed climbing 3-5 steps with a railing? : A Lot 6 Click Score: 17    End of Session Equipment Utilized During Treatment: Gait belt Activity Tolerance: Patient tolerated treatment well Patient left: in chair;with call bell/phone within reach;with chair alarm set Nurse Communication: Mobility status PT Visit Diagnosis: Muscle weakness (generalized) (M62.81);Unsteadiness on feet (R26.81);Difficulty in walking, not elsewhere classified (R26.2)    Time: [id number] PT Time Calculation (min) (ACUTE ONLY): 29 min   Charges:   PT Evaluation $PT Eval Moderate Complexity: 1 Mod PT Treatments $Therapeutic Activity: 8-22 mins        Todd Trujillo PT, DPT 12:27 PM,09/13/19 [phone]  Curtistine Pettitt Drucilla Chalet 09/13/2019, 12:20 PM

## 2019-09-14 ENCOUNTER — Telehealth: Payer: Self-pay

## 2019-09-14 NOTE — Telephone Encounter (Signed)
Patients wife Enid Derry) states he plans to complete hospital follow up with the [facility] at this time.

## 2020-09-15 ENCOUNTER — Ambulatory Visit (INDEPENDENT_AMBULATORY_CARE_PROVIDER_SITE_OTHER): Payer: No Typology Code available for payment source | Admitting: Internal Medicine

## 2020-09-15 ENCOUNTER — Other Ambulatory Visit: Payer: Self-pay

## 2020-09-15 DIAGNOSIS — G319 Degenerative disease of nervous system, unspecified: Secondary | ICD-10-CM | POA: Diagnosis not present

## 2020-09-15 DIAGNOSIS — I10 Essential (primary) hypertension: Secondary | ICD-10-CM | POA: Diagnosis not present

## 2020-09-15 DIAGNOSIS — E7800 Pure hypercholesterolemia, unspecified: Secondary | ICD-10-CM

## 2020-09-15 DIAGNOSIS — I739 Peripheral vascular disease, unspecified: Secondary | ICD-10-CM | POA: Diagnosis not present

## 2020-09-15 DIAGNOSIS — R634 Abnormal weight loss: Secondary | ICD-10-CM

## 2020-09-15 DIAGNOSIS — N281 Cyst of kidney, acquired: Secondary | ICD-10-CM

## 2020-09-15 DIAGNOSIS — Z8673 Personal history of transient ischemic attack (TIA), and cerebral infarction without residual deficits: Secondary | ICD-10-CM

## 2020-09-15 DIAGNOSIS — Z8546 Personal history of malignant neoplasm of prostate: Secondary | ICD-10-CM

## 2020-09-15 DIAGNOSIS — D649 Anemia, unspecified: Secondary | ICD-10-CM

## 2020-09-15 DIAGNOSIS — R531 Weakness: Secondary | ICD-10-CM

## 2020-09-15 MED ORDER — ATORVASTATIN CALCIUM 80 MG PO TABS: 80.0000 mg | ORAL_TABLET | Freq: Every day | ORAL | 2 refills | Status: DC

## 2020-09-15 NOTE — Progress Notes (Signed)
Patient ID: Todd Fears., male   DOB: 1929/09/03, 87 y.o.   MRN: [id number]   Subjective:    Patient ID: Todd Fears., male    DOB: November 07, 1929, 87 y.o.   MRN: [id number]  HPI This visit occurred during the SARS-CoV-2 public health emergency.  Safety protocols were in place, including screening questions prior to the visit, additional usage of staff PPE, and extensive cleaning of exam room while observing appropriate contact time as indicated for disinfecting solutions.  Patient here for a scheduled follow up. Admitted 08/2019 with right thalamic CVA.  MRA - acute to early subacute right thalamic infarct, small vessel atherosclerotic disease, chronic left cerebellar infarcts.  ECHO - grade 1 diastolic dysfunction, EF 78%, moderate increased PAP.  Carotid ultrasound - moderate heterogenous and calcified plaque, with no hemodynamically significant stenosis.  Started on aspirin and plavix.  Completed plavix.  Remains on aspirin now.  Limp - PT.  Worked on his posture and gait.  Mild cognitive impairment 22/30.  Has seen neurology.  Eating better now.  Was given a trial of remeron.  Did not tolerate.  No chest pain.  Breathing stable.  No abdominal pain or cramping.  Bowels moving.  History of prostate cancer.  S/p brachytherapy. Followed by urology - continues on tamulosin and finasteride.  Since being home feels things are stable. Still working on Dispensing optician.     Past Medical History:  Diagnosis Date  . Arthritis   . Cancer [facility])    prostate  . Hyperlipidemia   . Hypertension   . Left wrist fracture   . Macular degeneration of right eye   . Nephrolithiasis   . Prostate CA ([place]) 2003  . Prostate troubles    Patient was unsure of term   Past Surgical History:  Procedure Laterality Date  . TOTAL KNEE ARTHROPLASTY     Family History  Problem Relation Age of Onset  . Colon cancer Mother    Social History   Socioeconomic History  . Marital  status: Married    Spouse name: Not on file  . Number of children: Not on file  . Years of education: Not on file  . Highest education level: Not on file  Occupational History  . Not on file  Tobacco Use  . Smoking status: Never Smoker  . Smokeless tobacco: Never Used  Substance and Sexual Activity  . Alcohol use: No  . Drug use: Not Currently  . Sexual activity: Not on file  Other Topics Concern  . Not on file  Social History Narrative  . Not on file   Social Determinants of Health   Financial Resource Strain: Not on file  Food Insecurity: Not on file  Transportation Needs: Not on file  Physical Activity: Not on file  Stress: Not on file  Social Connections: Not on file    Outpatient Encounter Medications as of 09/15/2020  Medication Sig  . aspirin EC 81 MG tablet Take 1 tablet (81 mg total) by mouth daily. Swallow whole.  Marland Kitchen atorvastatin (LIPITOR) 80 MG tablet Take 1 tablet (80 mg total) by mouth daily.  Marland Kitchen lisinopril (PRINIVIL,ZESTRIL) 20 MG tablet Take 10 mg by mouth daily.   . tamsulosin (FLOMAX) 0.4 MG CAPS capsule Take 0.4 mg by mouth daily.  . [DISCONTINUED] atorvastatin (LIPITOR) 40 MG tablet Take 1 tablet (40 mg total) by mouth daily at 6 PM.  . [DISCONTINUED] metoprolol tartrate (LOPRESSOR) 25 MG tablet TAKE ONE TABLET EVERY MORNING (Patient  taking differently: Take 25 mg by mouth daily at 6 (six) AM. )  . [DISCONTINUED] omeprazole (PRILOSEC) 20 MG capsule Take 20 mg by mouth daily.    No facility-administered encounter medications on file as of 09/15/2020.    Review of Systems  Constitutional:       Decreased appetite.  Eating better.  Has lost some weight.   HENT: Negative for congestion.   Respiratory: Negative for cough and chest tightness.        Breathing stable.   Cardiovascular: Negative for chest pain, palpitations and leg swelling.  Gastrointestinal: Negative for abdominal pain, diarrhea, nausea and vomiting.  Genitourinary: Negative for difficulty  urinating and dysuria.  Musculoskeletal: Negative for joint swelling and myalgias.  Skin: Negative for color change and rash.  Neurological: Negative for dizziness, light-headedness and headaches.  Psychiatric/Behavioral: Negative for agitation and dysphoric mood.       Objective:    Physical Exam Vitals reviewed.  Constitutional:      General: He is not in acute distress.    Appearance: Normal appearance. He is well-developed and well-nourished.  HENT:     Head: Normocephalic and atraumatic.     Right Ear: External ear normal.     Left Ear: External ear normal.  Eyes:     General: No scleral icterus.       Right eye: No discharge.        Left eye: No discharge.     Conjunctiva/sclera: Conjunctivae normal.  Cardiovascular:     Rate and Rhythm: Normal rate and regular rhythm.  Pulmonary:     Effort: Pulmonary effort is normal. No respiratory distress.     Breath sounds: Normal breath sounds.  Abdominal:     General: Bowel sounds are normal.     Palpations: Abdomen is soft.     Tenderness: There is no abdominal tenderness.  Musculoskeletal:        General: No swelling, tenderness or edema.     Cervical back: Neck supple. No tenderness.  Lymphadenopathy:     Cervical: No cervical adenopathy.  Skin:    Findings: No erythema or rash.  Neurological:     Mental Status: He is alert.  Psychiatric:        Mood and Affect: Mood and affect and mood normal.        Behavior: Behavior normal.     BP 118/62   Pulse 68   Temp 97.6 F (36.4 C) (Oral)   Resp 16   Ht 5\' 10"  (1.778 m)   Wt 154 lb 9.6 oz (70.1 kg)   SpO2 99%   BMI 22.18 kg/m  Wt Readings from Last 3 Encounters:  09/15/20 154 lb 9.6 oz (70.1 kg)  09/12/19 158 lb (71.7 kg)  02/21/18 165 lb 3.2 oz (74.9 kg)     Lab Results  Component Value Date   WBC 8.9 09/12/2019   HGB 13.8 09/12/2019   HCT 43.2 09/12/2019   PLT 237 09/12/2019   GLUCOSE 108 (H) 09/12/2019   CHOL 199 09/13/2019   TRIG 75 09/13/2019    HDL 47 09/13/2019   LDLCALC 137 (H) 09/13/2019   ALT 11 09/12/2019   AST 20 09/12/2019   NA 137 09/12/2019   K 3.8 09/12/2019   CL 103 09/12/2019   CREATININE 0.94 09/12/2019   BUN 15 09/12/2019   CO2 22 09/12/2019   TSH 2.52 08/29/2017   INR 1.3 (H) 09/12/2019   HGBA1C 5.5 09/13/2019    ECHOCARDIOGRAM COMPLETE  Result Date: 09/13/2019    ECHOCARDIOGRAM REPORT   Patient Name:   Todd Shipley. Date of Exam: 09/13/2019 Medical Rec #:  [id number]                   Height:       71.0 in Accession #:    [id number]                  Weight:       158.0 lb Date of Birth:  09-21-1929                   BSA:          1.908 m Patient Age:    22 years                    BP:           151/84 mmHg Patient Gender: M                           HR:           67 bpm. Exam Location:  [facility] Procedure: 2D Echo Indications:     STROKE 434.91/I163.9  History:         Patient has prior history of Echocardiogram examinations, most                  recent 01/09/2016. TIA; Risk Factors:Dyslipidemia and                  Hypertension. PVD.  Sonographer:     Avanell Shackleton Referring Phys:  Unknown Foley NIU Diagnosing Phys: Ida Rogue MD IMPRESSIONS  1. Left ventricular ejection fraction, by estimation, is 60 to 65%. The left ventricle has normal function. The left ventricle has no regional wall motion abnormalities. Left ventricular diastolic parameters are consistent with Grade I diastolic dysfunction (impaired relaxation).  2. Right ventricular systolic function is normal. The right ventricular size is normal. There is moderately elevated pulmonary artery systolic pressure.  3. Tricuspid valve regurgitation is mild to moderate. FINDINGS  Left Ventricle: Left ventricular ejection fraction, by estimation, is 60 to 65%. The left ventricle has normal function. The left ventricle has no regional wall motion abnormalities. The left ventricular internal cavity size was normal in size. There is  no left ventricular  hypertrophy. Left ventricular diastolic parameters are consistent with Grade I diastolic dysfunction (impaired relaxation). Right Ventricle: The right ventricular size is normal. No increase in right ventricular wall thickness. Right ventricular systolic function is normal. There is moderately elevated pulmonary artery systolic pressure. The tricuspid regurgitant velocity is 3.07 m/s, and with an assumed right atrial pressure of 10 mmHg, the estimated right ventricular systolic pressure is 99.3 mmHg. Left Atrium: Left atrial size was normal in size. Right Atrium: Right atrial size was normal in size. Pericardium: There is no evidence of pericardial effusion. Mitral Valve: The mitral valve is normal in structure and function. Normal mobility of the mitral valve leaflets. No evidence of mitral valve regurgitation. No evidence of mitral valve stenosis. Tricuspid Valve: The tricuspid valve is normal in structure. Tricuspid valve regurgitation is mild to moderate. No evidence of tricuspid stenosis. Aortic Valve: The aortic valve is normal in structure and function. Aortic valve regurgitation is not visualized. No aortic stenosis is present. Aortic valve mean gradient measures 5.0 mmHg. Aortic valve peak gradient measures 9.5 mmHg. Aortic valve area, by VTI  measures 2.24 cm. Pulmonic Valve: The pulmonic valve was normal in structure. Pulmonic valve regurgitation is not visualized. No evidence of pulmonic stenosis. Aorta: The aortic root is normal in size and structure. Venous: The inferior vena cava is normal in size with greater than 50% respiratory variability, suggesting right atrial pressure of 3 mmHg. IAS/Shunts: No atrial level shunt detected by color flow Doppler.  LEFT VENTRICLE PLAX 2D LVIDd:         4.14 cm  Diastology LVIDs:         2.25 cm  LV e' lateral:   8.92 cm/s LV PW:         1.07 cm  LV E/e' lateral: 8.5 LV IVS:        1.01 cm  LV e' medial:    6.85 cm/s LVOT diam:     2.10 cm  LV E/e' medial:  11.1 LV  SV:         75 LV SV Index:   39 LVOT Area:     3.46 cm  RIGHT VENTRICLE         IVC TAPSE (M-mode): 3.1 cm  IVC diam: 1.65 cm LEFT ATRIUM             Index       RIGHT ATRIUM           Index LA diam:        3.40 cm 1.78 cm/m  RA Area:     15.80 cm LA Vol (A2C):   50.0 ml 26.21 ml/m RA Volume:   39.30 ml  20.60 ml/m LA Vol (A4C):   58.6 ml 30.72 ml/m LA Biplane Vol: 54.7 ml 28.68 ml/m  AORTIC VALVE AV Area (Vmax):    2.09 cm AV Area (Vmean):   2.02 cm AV Area (VTI):     2.24 cm AV Vmax:           154.00 cm/s AV Vmean:          110.000 cm/s AV VTI:            0.335 m AV Peak Grad:      9.5 mmHg AV Mean Grad:      5.0 mmHg LVOT Vmax:         93.10 cm/s LVOT Vmean:        64.200 cm/s LVOT VTI:          0.217 m LVOT/AV VTI ratio: 0.65  AORTA Ao Root diam: 3.50 cm MITRAL VALVE               TRICUSPID VALVE MV Area (PHT): 4.15 cm    TR Peak grad:   37.7 mmHg MV Decel Time: 183 msec    TR Vmax:        307.00 cm/s MV E velocity: 76.20 cm/s MV A velocity: 87.30 cm/s  SHUNTS MV E/A ratio:  0.87        Systemic VTI:  0.22 m                            Systemic Diam: 2.10 cm Ida Rogue MD Electronically signed by Ida Rogue MD Signature Date/Time: 09/13/2019/3:07:31 PM    Final        Assessment & Plan:   Problem List Items Addressed This Visit    Anemia    Follow cbc.       Relevant Orders   CBC with Differential/Platelet   Cerebral atrophy ([place])  Noted on recent scan.  Followed by neurology.  Restart aspirin and statin medication.       History of CVA (cerebrovascular accident)    History of right thalamic infarct.  Continue aspirin.  Completed course of plavix.  Continue lipitor and risk factor modification.       History of prostate cancer    Followed by urology.       HTN (hypertension)    Continue lisinopril.  Follow pressures.  Check metabolic panel.  Blood pressure ok.       Relevant Medications   atorvastatin (LIPITOR) 80 MG tablet   aspirin EC 81 MG tablet   Other  Relevant Orders   Basic metabolic panel   Hypercholesterolemia    Restart lipitor.  Follow lipid panel and liver function tests.        Relevant Medications   atorvastatin (LIPITOR) 80 MG tablet   aspirin EC 81 MG tablet   Other Relevant Orders   Hepatic function panel   Lipid panel   PVD (peripheral vascular disease) ([place])    Carotid ultrasound 2021 - no hemodynamically significant stenosis - moderate heterogenous and calcified plaque.  Restart lipitor.       Relevant Medications   atorvastatin (LIPITOR) 80 MG tablet   aspirin EC 81 MG tablet   Renal cyst    Renal cyst - likely renal cell CA.  Evaluated by urology.  CT 11/23/19.  Recommended f/u in one year.       Weakness    Has been working with therapy.  Concern regarding posture and strengthening/gait.  May need more PT = [facility].        Weight loss    Encourage increased po intake.  Is eating better.  Follow weight.  Previous abdominal CT and EGD - negative.       Relevant Orders   TSH       Einar Pheasant, MD

## 2020-09-15 NOTE — Patient Instructions (Signed)
Start enteric coated aspirin 81mg  per day  I want you to restart your cholesterol medication - lipitor 80mg  - one per day - as we discussed.

## 2020-09-21 ENCOUNTER — Encounter: Payer: Self-pay | Admitting: Internal Medicine

## 2020-09-21 ENCOUNTER — Telehealth: Payer: Self-pay | Admitting: Internal Medicine

## 2020-09-21 DIAGNOSIS — R531 Weakness: Secondary | ICD-10-CM | POA: Insufficient documentation

## 2020-09-21 DIAGNOSIS — N281 Cyst of kidney, acquired: Secondary | ICD-10-CM | POA: Insufficient documentation

## 2020-09-21 DIAGNOSIS — Z8673 Personal history of transient ischemic attack (TIA), and cerebral infarction without residual deficits: Secondary | ICD-10-CM | POA: Insufficient documentation

## 2020-09-21 DIAGNOSIS — N2889 Other specified disorders of kidney and ureter: Secondary | ICD-10-CM | POA: Insufficient documentation

## 2020-09-21 MED ORDER — ASPIRIN EC 81 MG PO TBEC: 81.0000 mg | DELAYED_RELEASE_TABLET | Freq: Every day | ORAL | 11 refills | Status: DC

## 2020-09-21 NOTE — Assessment & Plan Note (Signed)
Continue lisinopril.  Follow pressures.  Check metabolic panel.  Blood pressure ok.

## 2020-09-21 NOTE — Assessment & Plan Note (Signed)
Encourage increased po intake.  Is eating better.  Follow weight.  Previous abdominal CT and EGD - negative.

## 2020-09-21 NOTE — Telephone Encounter (Signed)
Patient's daughter, Amy called. She is asking Dr. Tonia Ghent to put a referral to continue his PT with [facility]. Amy's phone number is [phone].

## 2020-09-21 NOTE — Assessment & Plan Note (Signed)
History of right thalamic infarct.  Continue aspirin.  Completed course of plavix.  Continue lipitor and risk factor modification.

## 2020-09-21 NOTE — Assessment & Plan Note (Signed)
Renal cyst - likely renal cell CA.  Evaluated by urology.  CT 11/23/19.  Recommended f/u in one year.

## 2020-09-21 NOTE — Assessment & Plan Note (Signed)
Follow cbc.  

## 2020-09-21 NOTE — Telephone Encounter (Signed)
He had been receiving therapy and was going to let me know if needed to do anything more.  I am not sure if referral needed.  Rasheedah or calling [facility]  - may be able to help.  Let me know if I need to do anything more.  Also, need to know if he has had his flu vaccine.  Need to document if has had.  Make sure family aware he needs to restart (should be taking) - lipitor and aspirin.

## 2020-09-21 NOTE — Assessment & Plan Note (Signed)
Followed by urology.   

## 2020-09-21 NOTE — Assessment & Plan Note (Signed)
Restart lipitor.  Follow lipid panel and liver function tests.

## 2020-09-21 NOTE — Telephone Encounter (Signed)
I spoke with patients wife while already on the phone with her. He has an appt already scheduled for therapy. Wasn't sure if you needed to put in referral.

## 2020-09-21 NOTE — Assessment & Plan Note (Signed)
Has been working with therapy.  Concern regarding posture and strengthening/gait.  May need more PT = [facility].

## 2020-09-21 NOTE — Assessment & Plan Note (Signed)
Noted on recent scan.  Followed by neurology.  Restart aspirin and statin medication.

## 2020-09-21 NOTE — Assessment & Plan Note (Signed)
Carotid ultrasound 2021 - no hemodynamically significant stenosis - moderate heterogenous and calcified plaque.  Restart lipitor.

## 2020-09-22 ENCOUNTER — Encounter: Payer: Self-pay | Admitting: Internal Medicine

## 2020-09-22 DIAGNOSIS — Z8673 Personal history of transient ischemic attack (TIA), and cerebral infarction without residual deficits: Secondary | ICD-10-CM

## 2020-09-22 DIAGNOSIS — R531 Weakness: Secondary | ICD-10-CM

## 2020-09-23 NOTE — Telephone Encounter (Signed)
Thank you Rasheedah !! 

## 2020-09-23 NOTE — Telephone Encounter (Signed)
I have placed an order for the referral to [facility] physical therapy per pt's family request.  See phone message and my chart message regarding request. Wanted you to know he is already seeing [facility], but family stated they needed referral.

## 2020-09-23 NOTE — Telephone Encounter (Signed)
Jeani Hawking from [facility] [place], [phone], EXT [phone], fax [phone]. Any referral need to be sent first to [facility] for approval, form is being faxed, this is the one to use at all times. Please sent with referral last office notes.

## 2020-09-23 NOTE — Telephone Encounter (Signed)
Noted. Family aware.

## 2020-10-05 ENCOUNTER — Telehealth: Payer: Self-pay | Admitting: Internal Medicine

## 2020-10-05 MED ORDER — TAMSULOSIN HCL 0.4 MG PO CAPS: 0.4000 mg | ORAL_CAPSULE | Freq: Every day | ORAL | 1 refills | Status: DC

## 2020-10-05 NOTE — Telephone Encounter (Signed)
Pt daughter called they are requesting to have tamsulosin (FLOMAX) 0.4 MG CAPS capsule and Ensure plus 2 times daily  Send to the [place]  Fax to- [phone]

## 2020-10-06 ENCOUNTER — Other Ambulatory Visit: Payer: Self-pay

## 2020-10-06 ENCOUNTER — Other Ambulatory Visit (INDEPENDENT_AMBULATORY_CARE_PROVIDER_SITE_OTHER): Payer: Medicare Other

## 2020-10-06 DIAGNOSIS — E7800 Pure hypercholesterolemia, unspecified: Secondary | ICD-10-CM

## 2020-10-06 DIAGNOSIS — D649 Anemia, unspecified: Secondary | ICD-10-CM

## 2020-10-06 DIAGNOSIS — I10 Essential (primary) hypertension: Secondary | ICD-10-CM | POA: Diagnosis not present

## 2020-10-06 DIAGNOSIS — R634 Abnormal weight loss: Secondary | ICD-10-CM

## 2020-10-06 LAB — LIPID PANEL
Cholesterol: 116 mg/dL (ref 0–200)
HDL: 43.1 mg/dL (ref >39.00)
LDL Cholesterol: 61 mg/dL (ref 0–99)
NonHDL: 72.49
Total CHOL/HDL Ratio: 3
Triglycerides: 56 mg/dL (ref 0.0–149.0)
VLDL: 11.2 mg/dL (ref 0.0–40.0)

## 2020-10-06 LAB — HEPATIC FUNCTION PANEL
ALT: 9 U/L (ref 0–53)
AST: 16 U/L (ref 0–37)
Albumin: 3.8 g/dL (ref 3.5–5.2)
Alkaline Phosphatase: 79 U/L (ref 39–117)
Bilirubin, Direct: 0.1 mg/dL (ref 0.0–0.3)
Total Bilirubin: 0.6 mg/dL (ref 0.2–1.2)
Total Protein: 6.3 g/dL (ref 6.0–8.3)

## 2020-10-06 LAB — BASIC METABOLIC PANEL
BUN: 18 mg/dL (ref 6–23)
CO2: 27 mEq/L (ref 19–32)
Calcium: 8.9 mg/dL (ref 8.4–10.5)
Chloride: 106 mEq/L (ref 96–112)
Creatinine, Ser: 1 mg/dL (ref 0.40–1.50)
GFR: 67.17 mL/min (ref >60.00)
Glucose, Bld: 107 mg/dL — ABNORMAL HIGH (ref 70–99)
Potassium: 4.4 mEq/L (ref 3.5–5.1)
Sodium: 140 mEq/L (ref 135–145)

## 2020-10-06 LAB — CBC WITH DIFFERENTIAL/PLATELET
Basophils Absolute: 0.1 10*3/uL (ref 0.0–0.1)
Basophils Relative: 0.8 % (ref 0.0–3.0)
Eosinophils Absolute: 0.2 10*3/uL (ref 0.0–0.7)
Eosinophils Relative: 2.8 % (ref 0.0–5.0)
HCT: 34.8 % — ABNORMAL LOW (ref 39.0–52.0)
Hemoglobin: 11.5 g/dL — ABNORMAL LOW (ref 13.0–17.0)
Lymphocytes Relative: 29.8 % (ref 12.0–46.0)
Lymphs Abs: 2.5 10*3/uL (ref 0.7–4.0)
MCHC: 33.2 g/dL (ref 30.0–36.0)
MCV: 91.5 fl (ref 78.0–100.0)
Monocytes Absolute: 0.8 10*3/uL (ref 0.1–1.0)
Monocytes Relative: 9.4 % (ref 3.0–12.0)
Neutro Abs: 4.7 10*3/uL (ref 1.4–7.7)
Neutrophils Relative %: 57.2 % (ref 43.0–77.0)
Platelets: 241 10*3/uL (ref 150.0–400.0)
RBC: 3.81 Mil/uL — ABNORMAL LOW (ref 4.22–5.81)
RDW: 14.4 % (ref 11.5–15.5)
WBC: 8.3 10*3/uL (ref 4.0–10.5)

## 2020-10-06 LAB — TSH: TSH: 2.86 u[IU]/mL (ref 0.35–4.50)

## 2020-10-07 ENCOUNTER — Other Ambulatory Visit: Payer: Self-pay | Admitting: Internal Medicine

## 2020-10-07 DIAGNOSIS — D649 Anemia, unspecified: Secondary | ICD-10-CM

## 2020-10-07 NOTE — Progress Notes (Signed)
Order placed for f/u labs.  

## 2020-10-19 ENCOUNTER — Other Ambulatory Visit (INDEPENDENT_AMBULATORY_CARE_PROVIDER_SITE_OTHER): Payer: Medicare Other

## 2020-10-19 ENCOUNTER — Other Ambulatory Visit: Payer: Self-pay

## 2020-10-19 DIAGNOSIS — D649 Anemia, unspecified: Secondary | ICD-10-CM | POA: Diagnosis not present

## 2020-10-19 LAB — CBC WITH DIFFERENTIAL/PLATELET
Basophils Absolute: 0.1 10*3/uL (ref 0.0–0.1)
Basophils Relative: 1.1 % (ref 0.0–3.0)
Eosinophils Absolute: 0.2 10*3/uL (ref 0.0–0.7)
Eosinophils Relative: 2 % (ref 0.0–5.0)
HCT: 35.8 % — ABNORMAL LOW (ref 39.0–52.0)
Hemoglobin: 12 g/dL — ABNORMAL LOW (ref 13.0–17.0)
Lymphocytes Relative: 28.6 % (ref 12.0–46.0)
Lymphs Abs: 2.4 10*3/uL (ref 0.7–4.0)
MCHC: 33.4 g/dL (ref 30.0–36.0)
MCV: 91.5 fl (ref 78.0–100.0)
Monocytes Absolute: 0.7 10*3/uL (ref 0.1–1.0)
Monocytes Relative: 8.5 % (ref 3.0–12.0)
Neutro Abs: 5 10*3/uL (ref 1.4–7.7)
Neutrophils Relative %: 59.8 % (ref 43.0–77.0)
Platelets: 259 10*3/uL (ref 150.0–400.0)
RBC: 3.91 Mil/uL — ABNORMAL LOW (ref 4.22–5.81)
RDW: 14.4 % (ref 11.5–15.5)
WBC: 8.4 10*3/uL (ref 4.0–10.5)

## 2020-10-19 LAB — IBC + FERRITIN
Ferritin: 30.3 ng/mL (ref 22.0–322.0)
Iron: 57 ug/dL (ref 42–165)
Saturation Ratios: 22.7 % (ref 20.0–50.0)
Transferrin: 179 mg/dL — ABNORMAL LOW (ref 212.0–360.0)

## 2020-10-19 LAB — VITAMIN B12: Vitamin B-12: 391 pg/mL (ref 211–911)

## 2020-11-10 ENCOUNTER — Ambulatory Visit (INDEPENDENT_AMBULATORY_CARE_PROVIDER_SITE_OTHER): Payer: Medicare Other | Admitting: Dermatology

## 2020-11-10 ENCOUNTER — Encounter: Payer: Self-pay | Admitting: Dermatology

## 2020-11-10 ENCOUNTER — Other Ambulatory Visit: Payer: Self-pay

## 2020-11-10 DIAGNOSIS — L820 Inflamed seborrheic keratosis: Secondary | ICD-10-CM

## 2020-11-10 DIAGNOSIS — L570 Actinic keratosis: Secondary | ICD-10-CM

## 2020-11-10 DIAGNOSIS — L821 Other seborrheic keratosis: Secondary | ICD-10-CM | POA: Diagnosis not present

## 2020-11-10 DIAGNOSIS — L578 Other skin changes due to chronic exposure to nonionizing radiation: Secondary | ICD-10-CM

## 2020-11-10 DIAGNOSIS — D692 Other nonthrombocytopenic purpura: Secondary | ICD-10-CM | POA: Diagnosis not present

## 2020-11-10 DIAGNOSIS — Z85828 Personal history of other malignant neoplasm of skin: Secondary | ICD-10-CM | POA: Diagnosis not present

## 2020-11-10 NOTE — Patient Instructions (Signed)

## 2020-11-10 NOTE — Progress Notes (Addendum)
Follow-Up Visit   Subjective  Todd Trujillo. is a 87 y.o. male who presents for the following: Lesions (On the arms, hands, abdomen, and chest- patient is concerned and would like to discuss treatment options. The lesions are also very irritating to the patient and he would like them removed.).  The following portions of the chart were reviewed this encounter and updated as appropriate:   Tobacco  Allergies  Meds  Problems  Med Hx  Surg Hx  Fam Hx     Review of Systems:  No other skin or systemic complaints except as noted in HPI or Assessment and Plan.  Objective  Well appearing patient in no apparent distress; mood and affect are within normal limits.  A focused examination was performed including the hands, arms, face, and trunk. Relevant physical exam findings are noted in the Assessment and Plan.  Objective  B/L hands and arms x 52 (52): Erythematous thin papules/macules with gritty scale.   Objective  arms and chest x 19 (19): Erythematous keratotic or waxy stuck-on papule or plaque.   Assessment & Plan  AK (actinic keratosis) (52) B/L hands and arms x 52  Destruction of lesion - B/L hands and arms x 52 Complexity: simple   Destruction method: cryotherapy   Informed consent: discussed and consent obtained   Timeout:  patient name, date of birth, surgical site, and procedure verified Lesion destroyed using liquid nitrogen: Yes   Region frozen until ice ball extended beyond lesion: Yes   Outcome: patient tolerated procedure well with no complications   Post-procedure details: wound care instructions given    Inflamed seborrheic keratosis (19) arms and chest x 19  Plan to treat the back and face at follow up visit in 2 months.  Destruction of lesion - arms and chest x 19 Complexity: simple   Destruction method: cryotherapy   Informed consent: discussed and consent obtained   Timeout:  patient name, date of birth, surgical site, and procedure  verified Lesion destroyed using liquid nitrogen: Yes   Region frozen until ice ball extended beyond lesion: Yes   Outcome: patient tolerated procedure well with no complications   Post-procedure details: wound care instructions given     Actinic Damage - chronic, secondary to cumulative UV radiation exposure/sun exposure over time - diffuse scaly erythematous macules with underlying dyspigmentation - Recommend daily broad spectrum sunscreen SPF 30+ to sun-exposed areas, reapply every 2 hours as needed.  - Recommend staying in the shade or wearing long sleeves, sun glasses (UVA+UVB protection) and wide brim hats (4-inch brim around the entire circumference of the hat). - Call for new or changing lesions.  Seborrheic Keratoses - Stuck-on, waxy, tan-brown papules and/or plaques  - Benign-appearing - Discussed benign etiology and prognosis. - Observe - Call for any changes  Purpura - Chronic; persistent and recurrent.  Treatable, but not curable. - Violaceous macules and patches - Benign - Related to trauma, age, sun damage and/or use of blood thinners, chronic use of topical and/or oral steroids - Observe - Can use OTC arnica containing moisturizer such as Dermend Bruise Formula if desired - Call for worsening or other concerns  Actinic Damage - Severe, confluent actinic changes with pre-cancerous actinic keratoses  - Severe, chronic, not at goal, secondary to cumulative UV radiation exposure over time - diffuse scaly erythematous macules and papules with underlying dyspigmentation - Discussed Prescription "Field Treatment" for Severe, Chronic Confluent Actinic Changes with Pre-Cancerous Actinic Keratoses Field treatment involves treatment of an  entire area of skin that has confluent Actinic Changes (Sun/ Ultraviolet light damage) and PreCancerous Actinic Keratoses by method of PhotoDynamic Therapy (PDT) and/or prescription Topical Chemotherapy agents such as 5-fluorouracil,  5-fluorouracil/calcipotriene, and/or imiquimod.  The purpose is to decrease the number of clinically evident and subclinical PreCancerous lesions to prevent progression to development of skin cancer by chemically destroying early precancer changes that may or may not be visible.  It has been shown to reduce the risk of developing skin cancer in the treated area. As a result of treatment, redness, scaling, crusting, and open sores may occur during treatment course. One or more than one of these methods may be used and may have to be used several times to control, suppress and eliminate the PreCancerous changes. Discussed treatment course, expected reaction, and possible side effects. - Recommend daily broad spectrum sunscreen SPF 30+ to sun-exposed areas, reapply every 2 hours as needed.  - Staying in the shade or wearing long sleeves, sun glasses (UVA+UVB protection) and wide brim hats (4-inch brim around the entire circumference of the hat) are also recommended. - Call for new or changing lesions. - Recommend chemotherapeutic cream or PDT in the future.  History of Squamous Cell Carcinoma of the Skin - No evidence of recurrence today - No lymphadenopathy - Recommend regular full body skin exams - Recommend daily broad spectrum sunscreen SPF 30+ to sun-exposed areas, reapply every 2 hours as needed.  - Call if any new or changing lesions are noted between office visits  Return in about 2 months (around 01/10/2021) for AK follow up .  Luther Redo, CMA, am acting as scribe for Sarina Ser, MD .  Documentation: I have reviewed the above documentation for accuracy and completeness, and I agree with the above.  Sarina Ser, MD

## 2020-11-14 ENCOUNTER — Encounter: Payer: Self-pay | Admitting: Dermatology

## 2020-11-16 ENCOUNTER — Encounter: Payer: Self-pay | Admitting: Internal Medicine

## 2020-11-17 ENCOUNTER — Encounter: Payer: Self-pay | Admitting: Internal Medicine

## 2020-11-17 DIAGNOSIS — N281 Cyst of kidney, acquired: Secondary | ICD-10-CM

## 2020-11-19 NOTE — Addendum Note (Signed)
Addended by: Ralene Bathe on: 11/19/2020 05:13 PM   Modules accepted: Level of Service

## 2020-11-23 NOTE — Telephone Encounter (Signed)
Order placed for urology referral.  

## 2020-12-16 ENCOUNTER — Telehealth: Payer: Self-pay | Admitting: Internal Medicine

## 2020-12-16 MED ORDER — LISINOPRIL 20 MG PO TABS: 10.0000 mg | ORAL_TABLET | Freq: Every day | ORAL | 1 refills | Status: DC

## 2020-12-16 MED ORDER — TAMSULOSIN HCL 0.4 MG PO CAPS: 0.4000 mg | ORAL_CAPSULE | Freq: Every day | ORAL | 1 refills | Status: DC

## 2020-12-16 NOTE — Telephone Encounter (Signed)
Patient's daughter called. Patient has miss placed his medication and the family has looked all over the house and can not find the medication. Patient needs refills on lisinopril (PRINIVIL,ZESTRIL) 20 MG tablet and tamsulosin (FLOMAX) 0.4 MG CAPS capsule.

## 2020-12-19 ENCOUNTER — Ambulatory Visit: Payer: No Typology Code available for payment source | Admitting: Internal Medicine

## 2020-12-21 ENCOUNTER — Encounter: Payer: Self-pay | Admitting: Internal Medicine

## 2020-12-21 NOTE — Telephone Encounter (Signed)
I have placed a referral to urology (for [place]).  See her my chart message.  Let me know if I need to do anything more.

## 2021-01-04 ENCOUNTER — Telehealth: Payer: Self-pay | Admitting: Internal Medicine

## 2021-01-04 NOTE — Telephone Encounter (Signed)
Thank you for your help.  I really appreciate it.    Dr Nicki Reaper

## 2021-01-04 NOTE — Telephone Encounter (Signed)
Lft vm for pt daughter to call ofc.thanks

## 2021-01-09 ENCOUNTER — Ambulatory Visit (INDEPENDENT_AMBULATORY_CARE_PROVIDER_SITE_OTHER): Payer: No Typology Code available for payment source | Admitting: Internal Medicine

## 2021-01-09 ENCOUNTER — Other Ambulatory Visit: Payer: Self-pay

## 2021-01-09 ENCOUNTER — Encounter: Payer: Self-pay | Admitting: Internal Medicine

## 2021-01-09 VITALS — BP 112/68 | HR 73 | Temp 96.6°F | Resp 16 | Ht 70.0 in | Wt 149.6 lb

## 2021-01-09 DIAGNOSIS — I739 Peripheral vascular disease, unspecified: Secondary | ICD-10-CM | POA: Diagnosis not present

## 2021-01-09 DIAGNOSIS — E7800 Pure hypercholesterolemia, unspecified: Secondary | ICD-10-CM | POA: Diagnosis not present

## 2021-01-09 DIAGNOSIS — I10 Essential (primary) hypertension: Secondary | ICD-10-CM

## 2021-01-09 DIAGNOSIS — Z8546 Personal history of malignant neoplasm of prostate: Secondary | ICD-10-CM

## 2021-01-09 DIAGNOSIS — R634 Abnormal weight loss: Secondary | ICD-10-CM | POA: Diagnosis not present

## 2021-01-09 DIAGNOSIS — N281 Cyst of kidney, acquired: Secondary | ICD-10-CM

## 2021-01-09 DIAGNOSIS — G319 Degenerative disease of nervous system, unspecified: Secondary | ICD-10-CM

## 2021-01-09 DIAGNOSIS — Z8673 Personal history of transient ischemic attack (TIA), and cerebral infarction without residual deficits: Secondary | ICD-10-CM

## 2021-01-09 DIAGNOSIS — Z862 Personal history of diseases of the blood and blood-forming organs and certain disorders involving the immune mechanism: Secondary | ICD-10-CM

## 2021-01-09 LAB — BASIC METABOLIC PANEL
BUN: 17 mg/dL (ref 6–23)
CO2: 25 mEq/L (ref 19–32)
Calcium: 9.3 mg/dL (ref 8.4–10.5)
Chloride: 105 mEq/L (ref 96–112)
Creatinine, Ser: 1.06 mg/dL (ref 0.40–1.50)
GFR: 62.52 mL/min (ref >60.00)
Glucose, Bld: 102 mg/dL — ABNORMAL HIGH (ref 70–99)
Potassium: 4.8 mEq/L (ref 3.5–5.1)
Sodium: 139 mEq/L (ref 135–145)

## 2021-01-09 LAB — CBC WITH DIFFERENTIAL/PLATELET
Basophils Absolute: 0.1 10*3/uL (ref 0.0–0.1)
Basophils Relative: 0.8 % (ref 0.0–3.0)
Eosinophils Absolute: 0.1 10*3/uL (ref 0.0–0.7)
Eosinophils Relative: 1.1 % (ref 0.0–5.0)
HCT: 34.3 % — ABNORMAL LOW (ref 39.0–52.0)
Hemoglobin: 11.3 g/dL — ABNORMAL LOW (ref 13.0–17.0)
Lymphocytes Relative: 19.8 % (ref 12.0–46.0)
Lymphs Abs: 1.6 10*3/uL (ref 0.7–4.0)
MCHC: 32.8 g/dL (ref 30.0–36.0)
MCV: 89.4 fl (ref 78.0–100.0)
Monocytes Absolute: 0.8 10*3/uL (ref 0.1–1.0)
Monocytes Relative: 9.1 % (ref 3.0–12.0)
Neutro Abs: 5.8 10*3/uL (ref 1.4–7.7)
Neutrophils Relative %: 69.2 % (ref 43.0–77.0)
Platelets: 301 10*3/uL (ref 150.0–400.0)
RBC: 3.84 Mil/uL — ABNORMAL LOW (ref 4.22–5.81)
RDW: 15.3 % (ref 11.5–15.5)
WBC: 8.3 10*3/uL (ref 4.0–10.5)

## 2021-01-09 LAB — HEPATIC FUNCTION PANEL
ALT: 5 U/L (ref 0–53)
AST: 13 U/L (ref 0–37)
Albumin: 3.8 g/dL (ref 3.5–5.2)
Alkaline Phosphatase: 103 U/L (ref 39–117)
Bilirubin, Direct: 0.1 mg/dL (ref 0.0–0.3)
Total Bilirubin: 0.6 mg/dL (ref 0.2–1.2)
Total Protein: 6.1 g/dL (ref 6.0–8.3)

## 2021-01-09 NOTE — Progress Notes (Signed)
Patient ID: Todd Fears., male   DOB: 1930-03-14, 87 y.o.   MRN: [id number]   Subjective:    Patient ID: Todd Fears., male    DOB: 07-20-1929, 87 y.o.   MRN: [id number]  HPI This visit occurred during the SARS-CoV-2 public health emergency.  Safety protocols were in place, including screening questions prior to the visit, additional usage of staff PPE, and extensive cleaning of exam room while observing appropriate contact time as indicated for disinfecting solutions.   Patient here for a scheduled follow up.  He is accompanied by his daughter.  History obtained from both of them. Being evaluated by urology - renal lesion - concern regarding renal cell CA.  Working to get evaluation closer to home.  Tries to stay active.  Has had a few falls recently.  One fall - was digging and fell.  Has fallen in the grass.  No known injuries.  Weaker going up and down staires.  Discussed falls.  Discussed therapy.  No chest pain or sob reported.  Eating.  Has lost weight.  No abdominal pain.  Some intermittent diarrhea.    Past Medical History:  Diagnosis Date   Arthritis    Cancer [facility])    prostate   Hyperlipidemia    Hypertension    Left wrist fracture    Macular degeneration of right eye    Nephrolithiasis    Prostate CA ([place]) 2003   Prostate troubles    Patient was unsure of term   Squamous cell carcinoma of skin 01/23/2017   R distal lat bicep near elbow   Past Surgical History:  Procedure Laterality Date   TOTAL KNEE ARTHROPLASTY     Family History  Problem Relation Age of Onset   Colon cancer Mother    Social History   Socioeconomic History   Marital status: Married    Spouse name: Not on file   Number of children: Not on file   Years of education: Not on file   Highest education level: Not on file  Occupational History   Not on file  Tobacco Use   Smoking status: Never   Smokeless tobacco: Never  Substance and Sexual Activity   Alcohol use: No    Drug use: Not Currently   Sexual activity: Not on file  Other Topics Concern   Not on file  Social History Narrative   Not on file   Social Determinants of Health   Financial Resource Strain: Not on file  Food Insecurity: Not on file  Transportation Needs: Not on file  Physical Activity: Not on file  Stress: Not on file  Social Connections: Not on file    Review of Systems  Constitutional:  Negative for fever.       Eating.  Weight loss as outlined.    HENT:  Negative for congestion and sinus pressure.   Respiratory:  Negative for cough and chest tightness.        Breathing stable.   Cardiovascular:  Negative for chest pain, palpitations and leg swelling.  Gastrointestinal:  Negative for abdominal pain, nausea and vomiting.       Intermittent diarrhea.    Genitourinary:  Negative for difficulty urinating and dysuria.  Musculoskeletal:  Negative for joint swelling and myalgias.  Skin:  Negative for color change and rash.  Neurological:  Negative for dizziness, light-headedness and headaches.  Psychiatric/Behavioral:  Negative for agitation and dysphoric mood.       Objective:    Physical  Exam Constitutional:      General: He is not in acute distress.    Appearance: Normal appearance. He is well-developed.  HENT:     Head: Normocephalic and atraumatic.     Right Ear: External ear normal.     Left Ear: External ear normal.  Eyes:     General: No scleral icterus.       Right eye: No discharge.        Left eye: No discharge.  Cardiovascular:     Rate and Rhythm: Normal rate and regular rhythm.  Pulmonary:     Effort: Pulmonary effort is normal. No respiratory distress.     Breath sounds: Normal breath sounds.  Abdominal:     General: Bowel sounds are normal.     Palpations: Abdomen is soft.     Tenderness: There is no abdominal tenderness.  Musculoskeletal:        General: No swelling or tenderness.     Cervical back: Neck supple. No tenderness.   Lymphadenopathy:     Cervical: No cervical adenopathy.  Skin:    Findings: No erythema or rash.  Neurological:     Mental Status: He is alert.  Psychiatric:        Mood and Affect: Mood normal.        Behavior: Behavior normal.    BP 112/68   Pulse 73   Temp (!) 96.6 F (35.9 C)   Resp 16   Ht 5\' 10"  (1.778 m)   Wt 149 lb 9.6 oz (67.9 kg)   SpO2 99%   BMI 21.47 kg/m  Wt Readings from Last 3 Encounters:  01/09/21 149 lb 9.6 oz (67.9 kg)  09/15/20 154 lb 9.6 oz (70.1 kg)  09/12/19 158 lb (71.7 kg)    Outpatient Encounter Medications as of 01/09/2021  Medication Sig   aspirin EC 81 MG tablet Take 1 tablet (81 mg total) by mouth daily. Swallow whole.   lisinopril (ZESTRIL) 20 MG tablet Take 0.5 tablets (10 mg total) by mouth daily.   tamsulosin (FLOMAX) 0.4 MG CAPS capsule Take 1 capsule (0.4 mg total) by mouth daily.   [DISCONTINUED] atorvastatin (LIPITOR) 80 MG tablet Take 1 tablet (80 mg total) by mouth daily.   No facility-administered encounter medications on file as of 01/09/2021.     Lab Results  Component Value Date   WBC 8.3 01/09/2021   HGB 11.3 (L) 01/09/2021   HCT 34.3 (L) 01/09/2021   PLT 301.0 01/09/2021   GLUCOSE 102 (H) 01/09/2021   CHOL 116 10/06/2020   TRIG 56.0 10/06/2020   HDL 43.10 10/06/2020   LDLCALC 61 10/06/2020   ALT 5 01/09/2021   AST 13 01/09/2021   NA 139 01/09/2021   K 4.8 01/09/2021   CL 105 01/09/2021   CREATININE 1.06 01/09/2021   BUN 17 01/09/2021   CO2 25 01/09/2021   TSH 2.86 10/06/2020   INR 1.3 (H) 09/12/2019   HGBA1C 5.5 09/13/2019    ECHOCARDIOGRAM COMPLETE  Result Date: 09/13/2019    ECHOCARDIOGRAM REPORT   Patient Name:   Todd Wendorf. Date of Exam: 09/13/2019 Medical Rec #:  [id number]                   Height:       71.0 in Accession #:    [id number]                  Weight:       158.0 lb  Date of Birth:  1929/11/14                   BSA:          1.908 m Patient Age:    89 years                    BP:            151/84 mmHg Patient Gender: M                           HR:           67 bpm. Exam Location:  [facility] Procedure: 2D Echo Indications:     STROKE 434.91/I163.9  History:         Patient has prior history of Echocardiogram examinations, most                  recent 01/09/2016. TIA; Risk Factors:Dyslipidemia and                  Hypertension. PVD.  Sonographer:     Avanell Shackleton Referring Phys:  Unknown Foley NIU Diagnosing Phys: Ida Rogue MD IMPRESSIONS  1. Left ventricular ejection fraction, by estimation, is 60 to 65%. The left ventricle has normal function. The left ventricle has no regional wall motion abnormalities. Left ventricular diastolic parameters are consistent with Grade I diastolic dysfunction (impaired relaxation).  2. Right ventricular systolic function is normal. The right ventricular size is normal. There is moderately elevated pulmonary artery systolic pressure.  3. Tricuspid valve regurgitation is mild to moderate. FINDINGS  Left Ventricle: Left ventricular ejection fraction, by estimation, is 60 to 65%. The left ventricle has normal function. The left ventricle has no regional wall motion abnormalities. The left ventricular internal cavity size was normal in size. There is  no left ventricular hypertrophy. Left ventricular diastolic parameters are consistent with Grade I diastolic dysfunction (impaired relaxation). Right Ventricle: The right ventricular size is normal. No increase in right ventricular wall thickness. Right ventricular systolic function is normal. There is moderately elevated pulmonary artery systolic pressure. The tricuspid regurgitant velocity is 3.07 m/s, and with an assumed right atrial pressure of 10 mmHg, the estimated right ventricular systolic pressure is 84.1 mmHg. Left Atrium: Left atrial size was normal in size. Right Atrium: Right atrial size was normal in size. Pericardium: There is no evidence of pericardial effusion. Mitral Valve: The mitral valve is normal  in structure and function. Normal mobility of the mitral valve leaflets. No evidence of mitral valve regurgitation. No evidence of mitral valve stenosis. Tricuspid Valve: The tricuspid valve is normal in structure. Tricuspid valve regurgitation is mild to moderate. No evidence of tricuspid stenosis. Aortic Valve: The aortic valve is normal in structure and function. Aortic valve regurgitation is not visualized. No aortic stenosis is present. Aortic valve mean gradient measures 5.0 mmHg. Aortic valve peak gradient measures 9.5 mmHg. Aortic valve area, by VTI measures 2.24 cm. Pulmonic Valve: The pulmonic valve was normal in structure. Pulmonic valve regurgitation is not visualized. No evidence of pulmonic stenosis. Aorta: The aortic root is normal in size and structure. Venous: The inferior vena cava is normal in size with greater than 50% respiratory variability, suggesting right atrial pressure of 3 mmHg. IAS/Shunts: No atrial level shunt detected by color flow Doppler.  LEFT VENTRICLE PLAX 2D LVIDd:         4.14 cm  Diastology LVIDs:  2.25 cm  LV e' lateral:   8.92 cm/s LV PW:         1.07 cm  LV E/e' lateral: 8.5 LV IVS:        1.01 cm  LV e' medial:    6.85 cm/s LVOT diam:     2.10 cm  LV E/e' medial:  11.1 LV SV:         75 LV SV Index:   39 LVOT Area:     3.46 cm  RIGHT VENTRICLE         IVC TAPSE (M-mode): 3.1 cm  IVC diam: 1.65 cm LEFT ATRIUM             Index       RIGHT ATRIUM           Index LA diam:        3.40 cm 1.78 cm/m  RA Area:     15.80 cm LA Vol (A2C):   50.0 ml 26.21 ml/m RA Volume:   39.30 ml  20.60 ml/m LA Vol (A4C):   58.6 ml 30.72 ml/m LA Biplane Vol: 54.7 ml 28.68 ml/m  AORTIC VALVE AV Area (Vmax):    2.09 cm AV Area (Vmean):   2.02 cm AV Area (VTI):     2.24 cm AV Vmax:           154.00 cm/s AV Vmean:          110.000 cm/s AV VTI:            0.335 m AV Peak Grad:      9.5 mmHg AV Mean Grad:      5.0 mmHg LVOT Vmax:         93.10 cm/s LVOT Vmean:        64.200 cm/s LVOT  VTI:          0.217 m LVOT/AV VTI ratio: 0.65  AORTA Ao Root diam: 3.50 cm MITRAL VALVE               TRICUSPID VALVE MV Area (PHT): 4.15 cm    TR Peak grad:   37.7 mmHg MV Decel Time: 183 msec    TR Vmax:        307.00 cm/s MV E velocity: 76.20 cm/s MV A velocity: 87.30 cm/s  SHUNTS MV E/A ratio:  0.87        Systemic VTI:  0.22 m                            Systemic Diam: 2.10 cm Ida Rogue MD Electronically signed by Ida Rogue MD Signature Date/Time: 09/13/2019/3:07:31 PM    Final        Assessment & Plan:   Problem List Items Addressed This Visit     Cerebral atrophy ([place])    Noted on recent scan.  Followed by neurology.  Would like to restart low dose cholesterol medication.        History of anemia    Follow cbc.        History of CVA (cerebrovascular accident)    History of right thalamic infarct.  Continue aspirin.  Completed course of plavix.  Continue lipitor and risk factor modification. Discussed falls and concern regarding unsteadiness.  Discussed physical therapy.  Will notify me if agreeable.  Refer to neurology.         Relevant Orders   Ambulatory referral to Neurology   History of prostate cancer  Followed by urology.        HTN (hypertension) - Primary    Continue lisinopril.  Follow pressures.  Check metabolic panel.  Blood pressure ok.        Relevant Orders   Basic metabolic panel (Completed)   Hypercholesterolemia    Was on lipitor.  Family concern regarding side effects.  Discussed starting low dose crestor.  Follow.        PVD (peripheral vascular disease) ([place])    Carotid ultrasound 2021 - no hemodynamically significant stenosis - moderate heterogenous and calcified plaque.  Discussed low dose cholesterol medication.         Renal cyst    Seeing urology.  Likely renal cell CA.         Weight loss    Persistent.  CT as outlined.  Following with urology.  Encourage increased po intake - nutritional supplements.  Follow.         Relevant Orders   Hepatic function panel (Completed)   CBC with Differential/Platelet (Completed)     Einar Pheasant, MD

## 2021-01-11 ENCOUNTER — Ambulatory Visit (INDEPENDENT_AMBULATORY_CARE_PROVIDER_SITE_OTHER): Payer: No Typology Code available for payment source | Admitting: Dermatology

## 2021-01-11 ENCOUNTER — Other Ambulatory Visit: Payer: Self-pay

## 2021-01-11 DIAGNOSIS — L820 Inflamed seborrheic keratosis: Secondary | ICD-10-CM

## 2021-01-11 DIAGNOSIS — Z1283 Encounter for screening for malignant neoplasm of skin: Secondary | ICD-10-CM

## 2021-01-11 DIAGNOSIS — L578 Other skin changes due to chronic exposure to nonionizing radiation: Secondary | ICD-10-CM

## 2021-01-11 DIAGNOSIS — S80812A Abrasion, left lower leg, initial encounter: Secondary | ICD-10-CM

## 2021-01-11 DIAGNOSIS — T148XXA Other injury of unspecified body region, initial encounter: Secondary | ICD-10-CM

## 2021-01-11 DIAGNOSIS — L570 Actinic keratosis: Secondary | ICD-10-CM

## 2021-01-11 MED ORDER — MUPIROCIN 2 % EX OINT: 1.0000 "application " | TOPICAL_OINTMENT | Freq: Two times a day (BID) | CUTANEOUS | 1 refills | Status: DC

## 2021-01-11 NOTE — Patient Instructions (Signed)

## 2021-01-11 NOTE — Progress Notes (Deleted)
Follow-Up Visit   Subjective  Todd Trujillo. is a 87 y.o. male who presents for the following: Actinic Keratosis (8 week follow up - treated with LN2 to arms/hands x 52) and Follow-up (ISK follow up of arms and chest treated with LN2).  Accompanied by daughter who contributes to history.  The following portions of the chart were reviewed this encounter and updated as appropriate:   Tobacco  Allergies  Meds  Problems  Med Hx  Surg Hx  Fam Hx      Review of Systems:  No other skin or systemic complaints except as noted in HPI or Assessment and Plan.  Objective  Well appearing patient in no apparent distress; mood and affect are within normal limits.  All skin waist up examined.  Face x 15, arms/hands x 6 (21) Erythematous thin papules/macules with gritty scale.   Back (total 17) (17) Erythematous keratotic or waxy stuck-on papule or plaque.   Left Lower Leg - Anterior Abrasions   Assessment & Plan  AK (actinic keratosis) (21) Face x 15, arms/hands x 6  Actinic Damage - Severe, confluent actinic changes with pre-cancerous actinic keratoses  - Severe, chronic, not at goal, secondary to cumulative UV radiation exposure over time - diffuse scaly erythematous macules and papules with underlying dyspigmentation - Discussed Prescription "Field Treatment" for Severe, Chronic Confluent Actinic Changes with Pre-Cancerous Actinic Keratoses Field treatment involves treatment of an entire area of skin that has confluent Actinic Changes (Sun/ Ultraviolet light damage) and PreCancerous Actinic Keratoses by method of PhotoDynamic Therapy (PDT) and/or prescription Topical Chemotherapy agents such as 5-fluorouracil, 5-fluorouracil/calcipotriene, and/or imiquimod.  The purpose is to decrease the number of clinically evident and subclinical PreCancerous lesions to prevent progression to development of skin cancer by chemically destroying early precancer changes that may or may  not be visible.  It has been shown to reduce the risk of developing skin cancer in the treated area. As a result of treatment, redness, scaling, crusting, and open sores may occur during treatment course. One or more than one of these methods may be used and may have to be used several times to control, suppress and eliminate the PreCancerous changes. Discussed treatment course, expected reaction, and possible side effects. - Recommend daily broad spectrum sunscreen SPF 30+ to sun-exposed areas, reapply every 2 hours as needed.  - Staying in the shade or wearing long sleeves, sun glasses (UVA+UVB protection) and wide brim hats (4-inch brim around the entire circumference of the hat) are also recommended. - Call for new or changing lesions.  Will plan to treat face, arms/hands with PDT - plan occlusion with arm treatment  Destruction of lesion - Face x 15, arms/hands x 6 Complexity: simple   Destruction method: cryotherapy   Informed consent: discussed and consent obtained   Timeout:  patient name, date of birth, surgical site, and procedure verified Lesion destroyed using liquid nitrogen: Yes   Region frozen until ice ball extended beyond lesion: Yes   Outcome: patient tolerated procedure well with no complications   Post-procedure details: wound care instructions given    Inflamed seborrheic keratosis Back  Destruction of lesion - Back  Abrasion Left Lower Leg - Anterior  mupirocin ointment (BACTROBAN) 2 % - Left Lower Leg - Anterior Apply 1 application topically 2 (two) times daily.  Return for PDT in one month to arms (with occlusion), in two months to face then 4-6 months with Dr. Nehemiah Massed.  I, Ashok Cordia, CMA, am acting as scribe  for Sarina Ser, MD .  Documentation: I have reviewed the above documentation for accuracy and completeness, and I agree with the above.  Sarina Ser, MD

## 2021-01-16 ENCOUNTER — Encounter: Payer: Self-pay | Admitting: Internal Medicine

## 2021-01-16 NOTE — Assessment & Plan Note (Addendum)
History of right thalamic infarct.  Continue aspirin.  Completed course of plavix.  Continue lipitor and risk factor modification. Discussed falls and concern regarding unsteadiness.  Discussed physical therapy.  Will notify me if agreeable.  Refer to neurology.

## 2021-01-16 NOTE — Assessment & Plan Note (Signed)
Continue lisinopril.  Follow pressures.  Check metabolic panel.  Blood pressure ok.

## 2021-01-16 NOTE — Assessment & Plan Note (Signed)
Follow cbc.  

## 2021-01-16 NOTE — Assessment & Plan Note (Signed)
Was on lipitor.  Family concern regarding side effects.  Discussed starting low dose crestor.  Follow.

## 2021-01-16 NOTE — Assessment & Plan Note (Signed)
Persistent.  CT as outlined.  Following with urology.  Encourage increased po intake - nutritional supplements.  Follow.

## 2021-01-16 NOTE — Assessment & Plan Note (Signed)
Followed by urology.   

## 2021-01-16 NOTE — Assessment & Plan Note (Signed)
Seeing urology.  Likely renal cell CA.

## 2021-01-16 NOTE — Assessment & Plan Note (Addendum)
Carotid ultrasound 2021 - no hemodynamically significant stenosis - moderate heterogenous and calcified plaque.  Discussed low dose cholesterol medication.

## 2021-01-16 NOTE — Assessment & Plan Note (Signed)
Noted on recent scan.  Followed by neurology.  Would like to restart low dose cholesterol medication.

## 2021-01-17 ENCOUNTER — Telehealth: Payer: Self-pay | Admitting: Internal Medicine

## 2021-01-17 NOTE — Telephone Encounter (Signed)
Lft pt daughter a vm to call ofc about neurology referral. thanks

## 2021-01-18 ENCOUNTER — Telehealth: Payer: Self-pay | Admitting: Internal Medicine

## 2021-01-18 ENCOUNTER — Encounter: Payer: Self-pay | Admitting: Dermatology

## 2021-01-18 ENCOUNTER — Encounter: Payer: Self-pay | Admitting: Internal Medicine

## 2021-01-18 NOTE — Telephone Encounter (Signed)
Lft vm for pt daughter Amy to call ofc . thanks

## 2021-01-23 NOTE — Progress Notes (Signed)
Follow-Up Visit   Subjective  Todd Trujillo. is a 87 y.o. male who presents for the following: Actinic Keratosis (8 week follow up - treated with LN2 to arms/hands x 52) and Follow-up (ISK follow up of arms and chest treated with LN2). The patient presents for Upper Body Skin Exam (UBSE) for skin cancer screening and mole check.  The following portions of the chart were reviewed this encounter and updated as appropriate:   Tobacco  Allergies  Meds  Problems  Med Hx  Surg Hx  Fam Hx     Review of Systems:  No other skin or systemic complaints except as noted in HPI or Assessment and Plan.  Objective  Well appearing patient in no apparent distress; mood and affect are within normal limits.  All skin waist up examined.  Face x 15, arms/hands x 6 (21) Erythematous thin papules/macules with gritty scale.   Back (total 17) (17) Erythematous keratotic or waxy stuck-on papule or plaque.   Left Lower Leg - Anterior Abrasions   Assessment & Plan  AK (actinic keratosis) (21) Face x 15, arms/hands x 6  Actinic Damage - Severe, confluent actinic changes with pre-cancerous actinic keratoses  - Severe, chronic, not at goal, secondary to cumulative UV radiation exposure over time - diffuse scaly erythematous macules and papules with underlying dyspigmentation - Discussed Prescription "Field Treatment" for Severe, Chronic Confluent Actinic Changes with Pre-Cancerous Actinic Keratoses Field treatment involves treatment of an entire area of skin that has confluent Actinic Changes (Sun/ Ultraviolet light damage) and PreCancerous Actinic Keratoses by method of PhotoDynamic Therapy (PDT) and/or prescription Topical Chemotherapy agents such as 5-fluorouracil, 5-fluorouracil/calcipotriene, and/or imiquimod.  The purpose is to decrease the number of clinically evident and subclinical PreCancerous lesions to prevent progression to development of skin cancer by chemically destroying  early precancer changes that may or may not be visible.  It has been shown to reduce the risk of developing skin cancer in the treated area. As a result of treatment, redness, scaling, crusting, and open sores may occur during treatment course. One or more than one of these methods may be used and may have to be used several times to control, suppress and eliminate the PreCancerous changes. Discussed treatment course, expected reaction, and possible side effects. - Recommend daily broad spectrum sunscreen SPF 30+ to sun-exposed areas, reapply every 2 hours as needed.  - Staying in the shade or wearing long sleeves, sun glasses (UVA+UVB protection) and wide brim hats (4-inch brim around the entire circumference of the hat) are also recommended. - Call for new or changing lesions.  Will plant to treat face, arms/hands with PDT - plan occlusion with arm treatment  Destruction of lesion - Face x 15, arms/hands x 6 Complexity: simple   Destruction method: cryotherapy   Informed consent: discussed and consent obtained   Timeout:  patient name, date of birth, surgical site, and procedure verified Lesion destroyed using liquid nitrogen: Yes   Region frozen until ice ball extended beyond lesion: Yes   Outcome: patient tolerated procedure well with no complications   Post-procedure details: wound care instructions given    Inflamed seborrheic keratosis Back (total 17)  Destruction of lesion - Back (total 17) Complexity: simple   Destruction method: cryotherapy   Informed consent: discussed and consent obtained   Timeout:  patient name, date of birth, surgical site, and procedure verified Lesion destroyed using liquid nitrogen: Yes   Region frozen until ice ball extended beyond lesion: Yes  Outcome: patient tolerated procedure well with no complications   Post-procedure details: wound care instructions given    Abrasion Left Lower Leg - Anterior  mupirocin ointment (BACTROBAN) 2 % - Left Lower  Leg - Anterior Apply 1 application topically 2 (two) times daily.  Skin cancer screening  Return for PDT in one month to arms (with occlusion), in two months to face then 4-6 months with Dr. Nehemiah Massed.  I, Ashok Cordia, CMA, am acting as scribe for Sarina Ser, MD .  Documentation: I have reviewed the above documentation for accuracy and completeness, and I agree with the above.  Sarina Ser, MD

## 2021-01-30 ENCOUNTER — Other Ambulatory Visit: Payer: Self-pay | Admitting: Internal Medicine

## 2021-02-09 ENCOUNTER — Ambulatory Visit (INDEPENDENT_AMBULATORY_CARE_PROVIDER_SITE_OTHER): Payer: No Typology Code available for payment source

## 2021-02-09 ENCOUNTER — Other Ambulatory Visit: Payer: Self-pay

## 2021-02-09 DIAGNOSIS — L570 Actinic keratosis: Secondary | ICD-10-CM

## 2021-02-09 MED ORDER — AMINOLEVULINIC ACID HCL 20 % EX SOLR
1.0000 "application " | Freq: Once | CUTANEOUS | Status: AC
  Administered 2021-02-09: 354 mg via TOPICAL

## 2021-02-09 NOTE — Progress Notes (Signed)
Patient completed PDT therapy today.  1. AK (actinic keratosis) (4) Left Forearm - Posterior; Right Forearm - Posterior; Left Dorsal Hand; Right Dorsal Hand  Photodynamic therapy - Left Dorsal Hand, Left Forearm - Posterior, Right Dorsal Hand, Right Forearm - Posterior Procedure discussed: discussed risks, benefits, side effects. and alternatives   Prep: site scrubbed/prepped with acetone   Location:  Hands and arms Number of lesions:  Multiple Type of treatment:  Blue light Aminolevulinic Acid (see MAR for details): Levulan Number of Levulan sticks used:  2 Incubation time (minutes):  120 Number of minutes under lamp:  16 Number of seconds under lamp:  40 Cooling:  Floor fan Outcome: patient tolerated procedure well with no complications   Post-procedure details: sunscreen applied    Related Medications Aminolevulinic Acid HCl 20 % SOLR 354 mg

## 2021-02-09 NOTE — Patient Instructions (Signed)

## 2021-02-23 ENCOUNTER — Encounter: Payer: Self-pay | Admitting: Urology

## 2021-02-23 ENCOUNTER — Encounter: Payer: Self-pay | Admitting: Internal Medicine

## 2021-02-23 ENCOUNTER — Ambulatory Visit (INDEPENDENT_AMBULATORY_CARE_PROVIDER_SITE_OTHER): Payer: Medicare Other | Admitting: Urology

## 2021-02-23 ENCOUNTER — Other Ambulatory Visit: Payer: Self-pay

## 2021-02-23 VITALS — BP 109/66 | HR 70 | Ht 71.0 in | Wt 147.0 lb

## 2021-02-23 DIAGNOSIS — R351 Nocturia: Secondary | ICD-10-CM

## 2021-02-23 DIAGNOSIS — N2889 Other specified disorders of kidney and ureter: Secondary | ICD-10-CM | POA: Diagnosis not present

## 2021-02-23 DIAGNOSIS — C61 Malignant neoplasm of prostate: Secondary | ICD-10-CM | POA: Diagnosis not present

## 2021-02-23 NOTE — Progress Notes (Signed)
02/23/21 11:38 AM   [name]. [birth year] [id number]  CC: Nocturia, distant history of prostate cancer, left renal mass  HPI: I saw Mr. Mussell and his daughter in clinic to establish care for the above issues.  He was previously follows at the [place], and I was able to review some of those outside records.  The daughter provides most of the history today.  He reportedly had prostate cancer over 40 years ago and underwent an open prostatectomy with Dr. Eliberto Ivory.  He has had ED since that time, but has minimal to no urinary leakage.  PSA was very low as expected in 2018 and 0.26.  His primary urinary complaint is nocturia 4-5 times at night, but is not particularly bothered during the day.  He does take Flomax daily.  He goes to bed at 7 PM, and does drink some diet sodas in the afternoon.  He was previously followed at the [facility] for a left-sided cystic enhancing renal mass worrisome for RCC.  I unfortunately do not have the actual images to review, but per the report from the CT in May 2022 there is a cyst on the left kidney measuring 4.7 cm from 4.4 previously in May 2021 with an enhancing mural nodule within the cyst measuring 2.2 cm from 1.1 cm previously.  No evidence of metastatic disease or renal vein invasion.  He denies any gross hematuria or dysuria.  No history of UTIs.  He has normal renal function with EGFR greater than 60.   PMH: Past Medical History:  Diagnosis Date   Arthritis    Cancer [facility])    prostate   Hyperlipidemia    Hypertension    Left wrist fracture    Macular degeneration of right eye    Nephrolithiasis    Prostate CA ([place]) 2003   Prostate troubles    Patient was unsure of term   Squamous cell carcinoma of skin 01/23/2017   R distal lat bicep near elbow    Surgical History: Past Surgical History:  Procedure Laterality Date   TOTAL KNEE ARTHROPLASTY       Family History: Family History  Problem Relation Age of Onset   Colon cancer Mother      Social History:  reports that he has never smoked. He has never used smokeless tobacco. He reports that he does not currently use drugs. He reports that he does not drink alcohol.  Physical Exam: BP 109/66   Pulse 70   Ht '5\' 11"'  (1.803 m)   Wt 147 lb (66.7 kg)   BMI 20.50 kg/m    Constitutional: Elderly, frail-appearing Cardiovascular: No clubbing, cyanosis, or edema. Respiratory: Normal respiratory effort, no increased work of breathing. GI: Abdomen is soft, nontender, nondistended, no abdominal masses   Laboratory Data: Reviewed, see HPI Renal function normal with creatinine 1.06, EGFR greater than 60  Assessment & Plan:   Frail-appearing 87 year old male here with his daughter today who has been on active surveillance for a 4.5 cm left renal mass at the [place], and has a distant history of prostate cancer treated with an open prostatectomy over 40 years ago.  He has mild urinary symptoms of nocturia 4-5 times per night that are likely behavioral in nature.  Regarding his history of prostate cancer, he is well past the need for any yearly PSA surveillance.  I also encouraged him to discontinue Flomax as since he does not have a prostate it is unlikely this is significantly improving his urinary symptoms  in any way.  We focused on behavioral strategies including avoiding bladder irritants, minimizing fluids before bedtime, and double voiding prior to bedtime.  In terms of the left renal mass, it sounds like this has been under active surveillance for at least a few years, but I do not have all the records from the [place].  In the setting of his age, frailty, and comorbidities, I would strongly encouraged them to continue active surveillance, especially with only minimal change in the overall size from 4.4 cm to 4.7 cm in the last year.  We discussed the risk of developing symptoms or metastatic disease, but I still think the risks of any intervention with ablation, partial or radical  nephrectomy far outweigh any benefit.  I think they have a good understanding of this.  RTC May 2023 with MRI abdomen for active surveillance/watchful waiting of left renal mass UA, IPSS, PVR at that time   Nickolas Madrid, MD 02/23/2021  [facility] [address] [place], [place] [address] [phone]

## 2021-02-27 ENCOUNTER — Encounter: Payer: Self-pay | Admitting: Emergency Medicine

## 2021-02-27 ENCOUNTER — Emergency Department: Payer: No Typology Code available for payment source

## 2021-02-27 ENCOUNTER — Encounter: Payer: Self-pay | Admitting: Internal Medicine

## 2021-02-27 ENCOUNTER — Telehealth: Payer: Self-pay | Admitting: Internal Medicine

## 2021-02-27 ENCOUNTER — Other Ambulatory Visit: Payer: Self-pay

## 2021-02-27 ENCOUNTER — Emergency Department
Admission: EM | Admit: 2021-02-27 | Discharge: 2021-02-27 | Disposition: A | Discharge status: Home / Self Care | Payer: No Typology Code available for payment source | Attending: Emergency Medicine | Admitting: Emergency Medicine

## 2021-02-27 DIAGNOSIS — I10 Essential (primary) hypertension: Secondary | ICD-10-CM | POA: Diagnosis not present

## 2021-02-27 DIAGNOSIS — Z8673 Personal history of transient ischemic attack (TIA), and cerebral infarction without residual deficits: Secondary | ICD-10-CM | POA: Insufficient documentation

## 2021-02-27 DIAGNOSIS — U071 COVID-19: Secondary | ICD-10-CM | POA: Diagnosis not present

## 2021-02-27 DIAGNOSIS — Z79899 Other long term (current) drug therapy: Secondary | ICD-10-CM | POA: Diagnosis not present

## 2021-02-27 DIAGNOSIS — Z8546 Personal history of malignant neoplasm of prostate: Secondary | ICD-10-CM | POA: Diagnosis not present

## 2021-02-27 DIAGNOSIS — Z7982 Long term (current) use of aspirin: Secondary | ICD-10-CM | POA: Diagnosis not present

## 2021-02-27 DIAGNOSIS — R509 Fever, unspecified: Secondary | ICD-10-CM | POA: Diagnosis present

## 2021-02-27 LAB — URINALYSIS, COMPLETE (UACMP) WITH MICROSCOPIC
Bacteria, UA: NONE SEEN
Bilirubin Urine: NEGATIVE
Glucose, UA: NEGATIVE mg/dL
Hgb urine dipstick: NEGATIVE
Ketones, ur: 5 mg/dL — AB
Leukocytes,Ua: NEGATIVE
Nitrite: NEGATIVE
Protein, ur: NEGATIVE mg/dL
Specific Gravity, Urine: 1.021 (ref 1.005–1.030)
pH: 5 (ref 5.0–8.0)

## 2021-02-27 LAB — CBC WITH DIFFERENTIAL/PLATELET
Abs Immature Granulocytes: 0.02 10*3/uL (ref 0.00–0.07)
Basophils Absolute: 0 10*3/uL (ref 0.0–0.1)
Basophils Relative: 1 %
Eosinophils Absolute: 0.1 10*3/uL (ref 0.0–0.5)
Eosinophils Relative: 1 %
HCT: 32.4 % — ABNORMAL LOW (ref 39.0–52.0)
Hemoglobin: 10.4 g/dL — ABNORMAL LOW (ref 13.0–17.0)
Immature Granulocytes: 0 %
Lymphocytes Relative: 31 %
Lymphs Abs: 2 10*3/uL (ref 0.7–4.0)
MCH: 29.3 pg (ref 26.0–34.0)
MCHC: 32.1 g/dL (ref 30.0–36.0)
MCV: 91.3 fL (ref 80.0–100.0)
Monocytes Absolute: 0.8 10*3/uL (ref 0.1–1.0)
Monocytes Relative: 13 %
Neutro Abs: 3.4 10*3/uL (ref 1.7–7.7)
Neutrophils Relative %: 54 %
Platelets: 246 10*3/uL (ref 150–400)
RBC: 3.55 MIL/uL — ABNORMAL LOW (ref 4.22–5.81)
RDW: 15.1 % (ref 11.5–15.5)
WBC: 6.3 10*3/uL (ref 4.0–10.5)
nRBC: 0 % (ref 0.0–0.2)

## 2021-02-27 LAB — COMPREHENSIVE METABOLIC PANEL
ALT: 9 U/L (ref 0–44)
AST: 18 U/L (ref 15–41)
Albumin: 3.6 g/dL (ref 3.5–5.0)
Alkaline Phosphatase: 83 U/L (ref 38–126)
Anion gap: 7 (ref 5–15)
BUN: 22 mg/dL (ref 8–23)
CO2: 24 mmol/L (ref 22–32)
Calcium: 8.7 mg/dL — ABNORMAL LOW (ref 8.9–10.3)
Chloride: 103 mmol/L (ref 98–111)
Creatinine, Ser: 1.02 mg/dL (ref 0.61–1.24)
GFR, Estimated: >60 mL/min (ref >60)
Glucose, Bld: 108 mg/dL — ABNORMAL HIGH (ref 70–99)
Potassium: 4.2 mmol/L (ref 3.5–5.1)
Sodium: 134 mmol/L — ABNORMAL LOW (ref 135–145)
Total Bilirubin: 0.7 mg/dL (ref 0.3–1.2)
Total Protein: 6.5 g/dL (ref 6.5–8.1)

## 2021-02-27 LAB — RESP PANEL BY RT-PCR (FLU A&B, COVID) ARPGX2
Influenza A by PCR: NEGATIVE
Influenza B by PCR: NEGATIVE
SARS Coronavirus 2 by RT PCR: POSITIVE — AB

## 2021-02-27 LAB — TROPONIN I (HIGH SENSITIVITY): Troponin I (High Sensitivity): 8 ng/L (ref <18)

## 2021-02-27 MED ORDER — PAXLOVID 10 X 150 MG & 10 X 100MG PO TBPK: 2.0000 | ORAL_TABLET | Freq: Two times a day (BID) | ORAL | 0 refills | Status: DC

## 2021-02-27 MED ORDER — NIRMATRELVIR/RITONAVIR (PAXLOVID)TABLET: 3.0000 | ORAL_TABLET | Freq: Two times a day (BID) | ORAL | 0 refills | Status: AC

## 2021-02-27 MED ORDER — SODIUM CHLORIDE 0.9 % IV BOLUS
500.0000 mL | Freq: Once | INTRAVENOUS | Status: AC
Start: 2021-02-27 — End: 2021-02-27
  Administered 2021-02-27: 500 mL via INTRAVENOUS

## 2021-02-27 MED ORDER — NIRMATRELVIR/RITONAVIR (PAXLOVID)TABLET: 3.0000 | ORAL_TABLET | Freq: Two times a day (BID) | ORAL | 0 refills | Status: DC

## 2021-02-27 NOTE — ED Notes (Signed)
Pt NAD with family at bedside. A/ox4, verbalizes understanding of all DC and f/u instructions. All questions answered. Pt wheeled to lobby and assisted into vehicle.

## 2021-02-27 NOTE — ED Triage Notes (Signed)
First Nurse Note:  C/O cough x 1 day. Has tested positive for COVID.  Per EMS, VS wnl.

## 2021-02-27 NOTE — Discharge Instructions (Addendum)

## 2021-02-27 NOTE — ED Notes (Signed)
See triage note  Presents with family   Had some fever yesterday  Did home COVID test which was positive  Denies any SOB has been getting weak over the past few weeks with decreased appetite   Afebrile on arrival

## 2021-02-27 NOTE — Telephone Encounter (Signed)
Patient informed, Due to the high volume of calls and your symptoms we have to forward your call to our Triage Nurse to expedient your call. Please hold for the transfer.  Patient transferred to Access Nurse. Due to having a cough only and having tested positive for covid today. Advise no apts available virtually or in office.

## 2021-02-27 NOTE — Telephone Encounter (Signed)
See phone note sent to doc of day on this patient

## 2021-02-27 NOTE — ED Provider Notes (Signed)
[facility] Emergency Department Provider Note  ____________________________________________   Event Date/Time   First MD Initiated Contact with Patient 02/27/21 1234     (approximate)  I have reviewed the triage vital signs and the nursing notes.   HISTORY  Chief Complaint No chief complaint on file.    HPI Todd Trujillo. is a 87 y.o. male presents emergency department via EMS.  EMS states that the family stated he has had a fever since yesterday.  To positive test at home.  Denies any shortness of breath, chest pain, vomiting or diarrhea.  However the patient has been getting weaker over the past few weeks with decreased appetite and fluid intake.  States he is having difficulty standing on his own.  Family spoke with his PCP who told him that since he is high risk he needs to come to the emergency department for an evaluation and possibly antivirals.  Patient has had his COVID vaccination and boosters.  Patient has a history of hypertension, prostate cancer, CVA, plus other see past medical history  Past Medical History:  Diagnosis Date   Arthritis    Cancer ([place])    prostate   Hyperlipidemia    Hypertension    Left wrist fracture    Macular degeneration of right eye    Nephrolithiasis    Prostate CA ([place]) 2003   Prostate troubles    Patient was unsure of term   Squamous cell carcinoma of skin 01/23/2017   R distal lat bicep near elbow    Patient Active Problem List   Diagnosis Date Noted   History of CVA (cerebrovascular accident) 09/21/2020   Renal cyst 09/21/2020   Weakness 09/21/2020   Cerebral atrophy ([place]) 09/15/2020   Cerebrovascular accident (CVA) ([place])    Left facial numbness 09/12/2019   HTN (hypertension) 09/12/2019   GERD (gastroesophageal reflux disease) 09/12/2019   Arthritis 08/13/2018   Benign prostatic hyperplasia 08/13/2018   Diverticulosis 08/13/2018   History of anemia 08/13/2018   History of colonic  polyps 08/13/2018   History of nephrolithiasis 08/13/2018   History of pneumonia 08/13/2018   Weight loss 12/15/2016   Healthcare maintenance 12/14/2016   Skin lesion of chest wall 09/23/2016   Anemia 09/17/2016   Hypercholesterolemia 09/17/2016   Incomplete emptying of bladder 02/02/2016   TIA (transient ischemic attack) 01/08/2016   Macular degeneration 01/08/2016   History of prostate cancer 01/08/2016   PVD (peripheral vascular disease) ([place]) 10/12/2015   SCC (squamous cell carcinoma) 01/24/2015   Bladder outlet obstruction 04/15/2013   Acquired cyst of kidney 04/16/2012   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/2012    Past Surgical History:  Procedure Laterality Date   TOTAL KNEE ARTHROPLASTY      Prior to Admission medications   Medication Sig Start Date End Date Taking? Authorizing Provider  aspirin EC 81 MG tablet Take 1 tablet (81 mg total) by mouth daily. Swallow whole. 09/21/20   Einar Pheasant, MD  lisinopril (ZESTRIL) 20 MG tablet Take 0.5 tablets (10 mg total) by mouth daily. 12/16/20   Einar Pheasant, MD  mupirocin ointment (BACTROBAN) 2 % Apply 1 application topically 2 (two) times daily. 01/11/21   Ralene Bathe, MD  nirmatrelvir/ritonavir EUA (PAXLOVID) TABS Take 3 tablets by mouth 2 (two) times daily for 5 days. Patient GFR is greater than 60. Take nirmatrelvir (150 mg) two tablets twice daily for 5 days and ritonavir (100 mg) one tablet twice daily for 5 days. 02/27/21 03/04/21  Versie Starks, PA-C  tamsulosin (FLOMAX) 0.4 MG CAPS capsule TAKE ONE CAPSULE DAILY 30 MINUTES AFTER THE SAME MEAL EACH DAY 01/30/21   Einar Pheasant, MD    Allergies Codeine  Family History  Problem Relation Age of Onset   Colon cancer Mother     Social History Social History   Tobacco Use   Smoking status: Never   Smokeless tobacco: Never  Substance Use Topics   Alcohol use: No   Drug use: Not Currently    Review of Systems  Constitutional: Positive  fever/chills Eyes: No visual changes. ENT: No sore throat. Respiratory: Positive cough Cardiovascular: Denies chest pain Gastrointestinal: Denies abdominal pain Genitourinary: Negative for dysuria. Musculoskeletal: Negative for back pain. Skin: Negative for rash. Psychiatric: no mood changes,     ____________________________________________   PHYSICAL EXAM:  VITAL SIGNS: ED Triage Vitals  Enc Vitals Group     BP 02/27/21 1234 (!) 87/66     Pulse Rate 02/27/21 1234 76     Resp 02/27/21 1234 18     Temp 02/27/21 1234 98.6 F (37 C)     Temp Source 02/27/21 1234 Oral     SpO2 02/27/21 1234 96 %     Weight 02/27/21 1218 146 lb 13.2 oz (66.6 kg)     Height 02/27/21 1218 '5\' 11"'$  (1.803 m)     Head Circumference --      Peak Flow --      Pain Score 02/27/21 1218 0     Pain Loc --      Pain Edu? --      Excl. in [place]? --     Constitutional: Alert and oriented. Well appearing and in no acute distress.  Patient is very thin Eyes: Conjunctivae are normal.  Head: Atraumatic. Nose: No congestion/rhinnorhea. Mouth/Throat: Mucous membranes are moist.   Neck:  supple no lymphadenopathy noted Cardiovascular: Normal rate, regular rhythm. Heart sounds are normal Respiratory: Normal respiratory effort.  No retractions, lungs c t a  Abd: soft nontender bs normal all 4 quad GU: deferred Musculoskeletal: FROM all extremities, warm and well perfused Neurologic:  Normal speech and language.  Skin:  Skin is warm, dry and intact. No rash noted. Psychiatric: Mood and affect are normal. Speech and behavior are normal.  ____________________________________________   LABS (all labs ordered are listed, but only abnormal results are displayed)  Labs Reviewed  RESP PANEL BY RT-PCR (FLU A&B, COVID) ARPGX2 - Abnormal; Notable for the following components:      Result Value   SARS Coronavirus 2 by RT PCR POSITIVE (*)    All other components within normal limits  COMPREHENSIVE METABOLIC PANEL -  Abnormal; Notable for the following components:   Sodium 134 (*)    Glucose, Bld 108 (*)    Calcium 8.7 (*)    All other components within normal limits  CBC WITH DIFFERENTIAL/PLATELET - Abnormal; Notable for the following components:   RBC 3.55 (*)    Hemoglobin 10.4 (*)    HCT 32.4 (*)    All other components within normal limits  URINALYSIS, COMPLETE (UACMP) WITH MICROSCOPIC - Abnormal; Notable for the following components:   Color, Urine YELLOW (*)    APPearance HAZY (*)    Ketones, ur 5 (*)    All other components within normal limits  TROPONIN I (HIGH SENSITIVITY)   ____________________________________________   ____________________________________________  RADIOLOGY  Chest x-ray  ____________________________________________   PROCEDURES  Procedure(s) performed: EKG   Procedures    ____________________________________________  INITIAL IMPRESSION / ASSESSMENT AND PLAN / ED COURSE  Pertinent labs & imaging results that were available during my care of the patient were reviewed by me and considered in my medical decision making (see chart for details).   Patient is a 87 year old male presents emergency department with COVID-like symptoms.  Positive test at home.  See HPI.  Physical exam shows patient to be stable although his blood pressure is low.  DDx: COVID, dehydration, CAP, hyponatremia, hypokalemia  EKG shows sinus rhythm with first-degree AV block, see physician read  Chest x-ray reviewed by me confirmed by radiology to be negative for CHF or pneumonia CBC shows decreased H&H, however it is similar to patient's trend Troponin is normal, metabolic panel is normal for patient's trend, UA is normal  COVID test still pending.  COVID test positive we will place patient on paxlovid.  He appears to be stable and his blood pressure returned to normal after having fluids.  Feel that it is more appropriate to send him home covid to admit him.  He is to follow-up  with his regular doctor if not improving in 3 to 4 days.  Return the emergency department if worsening.  COVID test positive.  Patient was given prescription for paxlovid.  He is to drink plenty of fluids.  Return emergency department worsening.  Follow-up with your regular doctor if not improving.  We did discuss rebound symptoms after finishing the medication.  His daughter and he are in agreement with treatment plan.  He was discharged stable condition.   Todd Trujillo. was evaluated in Emergency Department on 02/27/2021 for the symptoms described in the history of present illness. He was evaluated in the context of the global COVID-19 pandemic, which necessitated consideration that the patient might be at risk for infection with the SARS-CoV-2 virus that causes COVID-19. Institutional protocols and algorithms that pertain to the evaluation of patients at risk for COVID-19 are in a state of rapid change based on information released by regulatory bodies including the CDC and federal and state organizations. These policies and algorithms were followed during the patient's care in the ED.    As part of my medical decision making, I reviewed the following data within the [place] History obtained from family, Nursing notes reviewed and incorporated, Labs reviewed , EKG interpreted sinus rhythm, first-degree heart block, Old chart reviewed, Radiograph reviewed , Notes from prior ED visits, and [place] Controlled Substance Database  ____________________________________________   FINAL CLINICAL IMPRESSION(S) / ED DIAGNOSES  Final diagnoses:  COVID-19      NEW MEDICATIONS STARTED DURING THIS VISIT:  Current Discharge Medication List     START taking these medications   Details  nirmatrelvir/ritonavir EUA (PAXLOVID) TABS Take 3 tablets by mouth 2 (two) times daily for 5 days. Patient GFR is greater than 60. Take nirmatrelvir (150 mg) two tablets twice daily for 5  days and ritonavir (100 mg) one tablet twice daily for 5 days. Qty: 30 tablet, Refills: 0         Note:  This document was prepared using Dragon voice recognition software and may include unintentional dictation errors.    Versie Starks, PA-C 02/27/21 1509    Vladimir Crofts, MD 02/27/21 [phone]

## 2021-02-27 NOTE — Telephone Encounter (Signed)
FYI-  Patients wife and daughter called in and stated that patient started with symptoms on 8/14. Tested positive with home test this morning. Symptoms are temp 100.1, runny nose, cough, increased weakness. Family is trying to limit exposure and they did not think they would be able to get him in the car to drive him to ED. Daughter was calling 911 to have him transported to ED for evaluation after we got off the phone.

## 2021-02-27 NOTE — Telephone Encounter (Signed)
Access nurse note pasted to chart. See my note below for documentation. Patient currently at ED

## 2021-02-27 NOTE — ED Triage Notes (Signed)
Per EMS-- Patient's family phone numbers are with chart labels in folder.

## 2021-02-28 NOTE — Telephone Encounter (Signed)
Seen in ED

## 2021-03-07 ENCOUNTER — Encounter: Payer: Self-pay | Admitting: Internal Medicine

## 2021-03-08 NOTE — Telephone Encounter (Signed)
Please call - as we discussed.

## 2021-03-08 NOTE — Telephone Encounter (Signed)
Spoke with Almyra Free. Cannot call Amy until after 3. Tried to change appt to virtual or move appt. Daughter stated appt does not need to be moved out. Daughter is wanting to have conversation about this message without patient being present if possible during part of the visit. Advised that patient will have to be present in order to do virtual visit. Daughter would still like to discuss with you. Let me know what I need to do about his appt. I will not know if they are agreeable to virtual until I talk to Amy.

## 2021-03-08 NOTE — Telephone Encounter (Signed)
Appt has been changed to virtual

## 2021-03-08 NOTE — Telephone Encounter (Signed)
Todd Trujillo called and asked that you wait until after 3 to call Todd Trujillo back. Todd Trujillo is watching her granddaughter and is unavailable until then.

## 2021-03-10 ENCOUNTER — Telehealth (INDEPENDENT_AMBULATORY_CARE_PROVIDER_SITE_OTHER): Payer: Medicare Other | Admitting: Internal Medicine

## 2021-03-10 DIAGNOSIS — D649 Anemia, unspecified: Secondary | ICD-10-CM

## 2021-03-10 DIAGNOSIS — R634 Abnormal weight loss: Secondary | ICD-10-CM

## 2021-03-10 DIAGNOSIS — I739 Peripheral vascular disease, unspecified: Secondary | ICD-10-CM | POA: Diagnosis not present

## 2021-03-10 DIAGNOSIS — E7800 Pure hypercholesterolemia, unspecified: Secondary | ICD-10-CM

## 2021-03-10 DIAGNOSIS — Z8673 Personal history of transient ischemic attack (TIA), and cerebral infarction without residual deficits: Secondary | ICD-10-CM

## 2021-03-10 DIAGNOSIS — R531 Weakness: Secondary | ICD-10-CM

## 2021-03-10 DIAGNOSIS — Z8546 Personal history of malignant neoplasm of prostate: Secondary | ICD-10-CM

## 2021-03-10 DIAGNOSIS — G319 Degenerative disease of nervous system, unspecified: Secondary | ICD-10-CM

## 2021-03-10 DIAGNOSIS — I10 Essential (primary) hypertension: Secondary | ICD-10-CM

## 2021-03-10 DIAGNOSIS — R4681 Obsessive-compulsive behavior: Secondary | ICD-10-CM
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
## 2021-03-11 ENCOUNTER — Encounter: Payer: Self-pay | Admitting: Internal Medicine

## 2021-03-11 DIAGNOSIS — R4681 Obsessive-compulsive behavior: Secondary | ICD-10-CM | POA: Insufficient documentation

## 2021-03-11 NOTE — Assessment & Plan Note (Signed)
Family described him as being obsessed about his wife and sexual relationship as outlined.  Agreeable to psychiatry referral.  Discuss with psychiatry and neurology.  Question of benefit of medication, for example remeron.

## 2021-03-11 NOTE — Assessment & Plan Note (Signed)
History of right thalamic infarct.  Continue aspirin.  Completed course of plavix.  Continue lipitor and risk factor modification.

## 2021-03-11 NOTE — Assessment & Plan Note (Signed)
Physical therapy - through home health.  Follow.

## 2021-03-11 NOTE — Assessment & Plan Note (Signed)
Was on lipitor.  Family previously concerned regarding side effects.  Follow lipid panel.

## 2021-03-11 NOTE — Assessment & Plan Note (Signed)
Noted on recent scan.  Saw neurology.  Discuss with neurology regarding current behavior.  Consider possibly remeron, etc.

## 2021-03-11 NOTE — Assessment & Plan Note (Signed)
Continue lisinopril.  Follow pressures.  Check metabolic panel.  Blood pressure ok.

## 2021-03-11 NOTE — Assessment & Plan Note (Addendum)
Carotid ultrasound 2021 - no hemodynamically significant stenosis - moderate heterogenous and calcified plaque. Will need to discuss low dose cholesterol medication.

## 2021-03-11 NOTE — Assessment & Plan Note (Signed)
Followed by urology.   

## 2021-03-11 NOTE — Assessment & Plan Note (Signed)
Encourage increased po intake - discussed nutritional supplements.  Consider possibility of remeron given current behavior, etc.  Follow.

## 2021-03-11 NOTE — Assessment & Plan Note (Signed)
hgb decreased  - reviewing ER labs.  Will need f/u cbc.  Check iron studies.

## 2021-03-14 ENCOUNTER — Ambulatory Visit: Payer: No Typology Code available for payment source

## 2021-03-17 ENCOUNTER — Telehealth: Payer: Self-pay | Admitting: Internal Medicine

## 2021-03-17 MED ORDER — QUETIAPINE FUMARATE 25 MG PO TABS: 25.0000 mg | ORAL_TABLET | Freq: Every day | ORAL | 5 refills | Status: DC

## 2021-03-17 NOTE — Telephone Encounter (Signed)
I had a virtual visit with Todd Trujillo, his wife and daughter-n-law (leighann pennington) and discussed their concerns.  See previous note.  Given his persistent "obsession" , I would like to start him on seroquel '25mg'$  q hs.  If agreeable, will send in rx for seroquel.  Keep [place] posted.

## 2021-03-17 NOTE — Telephone Encounter (Signed)
Noted  

## 2021-03-17 NOTE — Telephone Encounter (Signed)
LMTCB

## 2021-03-17 NOTE — Telephone Encounter (Signed)
Spoke with Patient's daughter, They are agreeable to medication.   Medication sent in to preferred pharmacy per protocol.

## 2021-03-17 NOTE — Addendum Note (Signed)
Addended by: Thressa Sheller on: 03/17/2021 11:24 AM   Modules accepted: Orders

## 2021-03-24 ENCOUNTER — Telehealth: Payer: Self-pay | Admitting: Internal Medicine

## 2021-03-24 NOTE — Telephone Encounter (Signed)
Called to make patient a 31mfollow up. Patient's wife is concerned about her husband. He is questioning her about the man visitor every night and there is no man visitor. Wife states he is getting worse and may need to have his medication adjusted. Appointment was scheduled for 04/18/2021, but wife would really like some help on this issue before appointment.

## 2021-03-24 NOTE — Telephone Encounter (Signed)
Patient per conversation with PCP called has been taking Seroquel since 03/18/21 advised patient may need to give more time to work, [facility] stated patient is better than before starting, just seeing night time visitor at times, but still better . Patient DPR stated she would call back if not better in next few days and will call talk to nurse if worsens over the weekend.

## 2021-04-18 ENCOUNTER — Encounter: Payer: Self-pay | Admitting: Internal Medicine

## 2021-04-18 ENCOUNTER — Other Ambulatory Visit: Payer: Self-pay

## 2021-04-18 ENCOUNTER — Ambulatory Visit (INDEPENDENT_AMBULATORY_CARE_PROVIDER_SITE_OTHER): Payer: No Typology Code available for payment source | Admitting: Internal Medicine

## 2021-04-18 VITALS — BP 118/66 | HR 76 | Temp 97.9°F | Resp 16 | Ht 71.0 in | Wt 143.7 lb

## 2021-04-18 DIAGNOSIS — K219 Gastro-esophageal reflux disease without esophagitis: Secondary | ICD-10-CM

## 2021-04-18 DIAGNOSIS — R4681 Obsessive-compulsive behavior: Secondary | ICD-10-CM

## 2021-04-18 DIAGNOSIS — Z8673 Personal history of transient ischemic attack (TIA), and cerebral infarction without residual deficits: Secondary | ICD-10-CM

## 2021-04-18 DIAGNOSIS — D649 Anemia, unspecified: Secondary | ICD-10-CM | POA: Diagnosis not present

## 2021-04-18 DIAGNOSIS — E7800 Pure hypercholesterolemia, unspecified: Secondary | ICD-10-CM

## 2021-04-18 DIAGNOSIS — R531 Weakness: Secondary | ICD-10-CM

## 2021-04-18 DIAGNOSIS — Z23 Encounter for immunization: Secondary | ICD-10-CM

## 2021-04-18 DIAGNOSIS — Z8546 Personal history of malignant neoplasm of prostate: Secondary | ICD-10-CM

## 2021-04-18 DIAGNOSIS — I739 Peripheral vascular disease, unspecified: Secondary | ICD-10-CM | POA: Diagnosis not present

## 2021-04-18 DIAGNOSIS — G319 Degenerative disease of nervous system, unspecified: Secondary | ICD-10-CM

## 2021-04-18 DIAGNOSIS — I10 Essential (primary) hypertension: Secondary | ICD-10-CM | POA: Diagnosis not present

## 2021-04-18 DIAGNOSIS — R634 Abnormal weight loss: Secondary | ICD-10-CM

## 2021-04-18 LAB — IBC + FERRITIN
Ferritin: 28.8 ng/mL (ref 22.0–322.0)
Iron: 28 ug/dL — ABNORMAL LOW (ref 42–165)
Saturation Ratios: 10.9 % — ABNORMAL LOW (ref 20.0–50.0)
TIBC: 257.6 ug/dL (ref 250.0–450.0)
Transferrin: 184 mg/dL — ABNORMAL LOW (ref 212.0–360.0)

## 2021-04-18 LAB — BASIC METABOLIC PANEL
BUN: 21 mg/dL (ref 6–23)
CO2: 28 mEq/L (ref 19–32)
Calcium: 9.1 mg/dL (ref 8.4–10.5)
Chloride: 102 mEq/L (ref 96–112)
Creatinine, Ser: 1.06 mg/dL (ref 0.40–1.50)
GFR: 62.4 mL/min (ref >60.00)
Glucose, Bld: 85 mg/dL (ref 70–99)
Potassium: 4.4 mEq/L (ref 3.5–5.1)
Sodium: 139 mEq/L (ref 135–145)

## 2021-04-18 LAB — CBC WITH DIFFERENTIAL/PLATELET
Basophils Absolute: 0.1 10*3/uL (ref 0.0–0.1)
Basophils Relative: 0.9 % (ref 0.0–3.0)
Eosinophils Absolute: 0.3 10*3/uL (ref 0.0–0.7)
Eosinophils Relative: 3 % (ref 0.0–5.0)
HCT: 31.9 % — ABNORMAL LOW (ref 39.0–52.0)
Hemoglobin: 10.3 g/dL — ABNORMAL LOW (ref 13.0–17.0)
Lymphocytes Relative: 25.3 % (ref 12.0–46.0)
Lymphs Abs: 2.2 10*3/uL (ref 0.7–4.0)
MCHC: 32.1 g/dL (ref 30.0–36.0)
MCV: 88.7 fl (ref 78.0–100.0)
Monocytes Absolute: 0.7 10*3/uL (ref 0.1–1.0)
Monocytes Relative: 8.5 % (ref 3.0–12.0)
Neutro Abs: 5.4 10*3/uL (ref 1.4–7.7)
Neutrophils Relative %: 62.3 % (ref 43.0–77.0)
Platelets: 314 10*3/uL (ref 150.0–400.0)
RBC: 3.6 Mil/uL — ABNORMAL LOW (ref 4.22–5.81)
RDW: 16 % — ABNORMAL HIGH (ref 11.5–15.5)
WBC: 8.6 10*3/uL (ref 4.0–10.5)

## 2021-04-18 LAB — HEPATIC FUNCTION PANEL
ALT: 5 U/L (ref 0–53)
AST: 12 U/L (ref 0–37)
Albumin: 3.7 g/dL (ref 3.5–5.2)
Alkaline Phosphatase: 84 U/L (ref 39–117)
Bilirubin, Direct: 0.1 mg/dL (ref 0.0–0.3)
Total Bilirubin: 0.4 mg/dL (ref 0.2–1.2)
Total Protein: 5.9 g/dL — ABNORMAL LOW (ref 6.0–8.3)

## 2021-04-18 LAB — VITAMIN B12: Vitamin B-12: 584 pg/mL (ref 211–911)

## 2021-04-18 NOTE — Progress Notes (Signed)
Patient ID: Todd Trujillo., male   DOB: 17-Nov-1929, 87 y.o.   MRN: [id number]   Subjective:    Patient ID: Todd Trujillo., male    DOB: 07/06/1930, 87 y.o.   MRN: [id number]  This visit occurred during the SARS-CoV-2 public health emergency.  Safety protocols were in place, including screening questions prior to the visit, additional usage of staff PPE, and extensive cleaning of exam room while observing appropriate contact time as indicated for disinfecting solutions.   Patient here for a scheduled follow up.   Chief Complaint  Patient presents with   Weight Loss   .   HPI He is accompanied by his daughter.  History obtained from both of them.  Recently had covid.  Breathing stable.  No increased cough or congestion.  Decreased appetite.  Is eating some, but continues to lose weight.  No abdominal pain.  No dysphagia.  Some acid reflux.  Bowels moving.  Recently started on seroquel.  This has helped some of his behavior (as outlined in last note).  Is sleeping some better.  Still feels needs something more to help control his actions and emotions.  He is unsteady.  Uses a cane.  Has had PT.  Did not feel made a big difference.     Past Medical History:  Diagnosis Date   Arthritis    Cancer [facility])    prostate   Hyperlipidemia    Hypertension    Left wrist fracture    Macular degeneration of right eye    Nephrolithiasis    Prostate CA ([place]) 2003   Prostate troubles    Patient was unsure of term   Squamous cell carcinoma of skin 01/23/2017   R distal lat bicep near elbow   Past Surgical History:  Procedure Laterality Date   TOTAL KNEE ARTHROPLASTY     Family History  Problem Relation Age of Onset   Colon cancer Mother    Social History   Socioeconomic History   Marital status: Married    Spouse name: Not on file   Number of children: Not on file   Years of education: Not on file   Highest education level: Not on file  Occupational History   Not  on file  Tobacco Use   Smoking status: Never   Smokeless tobacco: Never  Substance and Sexual Activity   Alcohol use: No   Drug use: Not Currently   Sexual activity: Not on file  Other Topics Concern   Not on file  Social History Narrative   Not on file   Social Determinants of Health   Financial Resource Strain: Not on file  Food Insecurity: Not on file  Transportation Needs: Not on file  Physical Activity: Not on file  Stress: Not on file  Social Connections: Not on file     Review of Systems  Constitutional:  Negative for appetite change and unexpected weight change.  HENT:  Negative for congestion and sinus pressure.   Respiratory:  Negative for cough and chest tightness.        Breathing stable.   Cardiovascular:  Negative for chest pain and palpitations.  Gastrointestinal:  Negative for abdominal pain, diarrhea, nausea and vomiting.  Genitourinary:  Negative for difficulty urinating and dysuria.  Musculoskeletal:  Negative for joint swelling and myalgias.  Skin:  Negative for color change and rash.  Neurological:  Negative for dizziness and headaches.  Psychiatric/Behavioral:  Negative for dysphoric mood.  Increased agitation as outlined.  Is sleeping better.       Objective:     BP 118/66   Pulse 76   Temp 97.9 F (36.6 C)   Resp 16   Ht 5\' 11"  (1.803 m)   Wt 143 lb 11.2 oz (65.2 kg)   SpO2 99%   BMI 20.04 kg/m  Wt Readings from Last 3 Encounters:  04/18/21 143 lb 11.2 oz (65.2 kg)  03/10/21 147 lb (66.7 kg)  02/27/21 146 lb 13.2 oz (66.6 kg)    Physical Exam Constitutional:      General: He is not in acute distress.    Appearance: Normal appearance. He is well-developed.  HENT:     Head: Normocephalic and atraumatic.     Right Ear: External ear normal.     Left Ear: External ear normal.  Eyes:     General: No scleral icterus.       Right eye: No discharge.        Left eye: No discharge.  Cardiovascular:     Rate and Rhythm: Normal  rate and regular rhythm.  Pulmonary:     Effort: Pulmonary effort is normal. No respiratory distress.     Breath sounds: Normal breath sounds.  Abdominal:     General: Bowel sounds are normal.     Palpations: Abdomen is soft.     Tenderness: There is no abdominal tenderness.  Musculoskeletal:        General: No swelling or tenderness.     Cervical back: Neck supple. No tenderness.  Lymphadenopathy:     Cervical: No cervical adenopathy.  Skin:    Findings: No erythema or rash.  Neurological:     Mental Status: He is alert.  Psychiatric:        Mood and Affect: Mood normal.        Behavior: Behavior normal.     Outpatient Encounter Medications as of 04/18/2021  Medication Sig   aspirin EC 81 MG tablet Take 1 tablet (81 mg total) by mouth daily. Swallow whole.   lisinopril (ZESTRIL) 20 MG tablet Take 0.5 tablets (10 mg total) by mouth daily.   mupirocin ointment (BACTROBAN) 2 % Apply 1 application topically 2 (two) times daily.   QUEtiapine (SEROQUEL) 25 MG tablet Take 1 tablet (25 mg total) by mouth at bedtime.   No facility-administered encounter medications on file as of 04/18/2021.     Lab Results  Component Value Date   WBC 8.6 04/18/2021   HGB 10.3 (L) 04/18/2021   HCT 31.9 (L) 04/18/2021   PLT 314.0 04/18/2021   GLUCOSE 85 04/18/2021   CHOL 116 10/06/2020   TRIG 56.0 10/06/2020   HDL 43.10 10/06/2020   LDLCALC 61 10/06/2020   ALT 5 04/18/2021   AST 12 04/18/2021   NA 139 04/18/2021   K 4.4 04/18/2021   CL 102 04/18/2021   CREATININE 1.06 04/18/2021   BUN 21 04/18/2021   CO2 28 04/18/2021   TSH 2.86 10/06/2020   INR 1.3 (H) 09/12/2019   HGBA1C 5.5 09/13/2019    DG Chest Port 1 View  Result Date: 02/27/2021 CLINICAL DATA:  COVID-19, hypertension, history prostate cancer EXAM: PORTABLE CHEST 1 VIEW COMPARISON:  Portable exam 1289 hours compared to 08/29/2017 FINDINGS: Normal heart size, mediastinal contours, and pulmonary vascularity. Atherosclerotic  calcification aorta. Lungs clear. No pulmonary infiltrate, pleural effusion, or pneumothorax. Bones demineralized. IMPRESSION: No acute abnormalities. Aortic Atherosclerosis (ICD10-I70.0). Electronically Signed   By: Crist Infante.D.  On: 02/27/2021 13:16       Assessment & Plan:   Problem List Items Addressed This Visit     Anemia   Cerebral atrophy ([place])    Noted on recent scan.  Saw neurology.  On seroquel.  Some improvement.  Follow         GERD (gastroesophageal reflux disease)    Acid reflux - consider pepcid.        History of CVA (cerebrovascular accident)    History of right thalamic infarct.  Continue aspirin.  Completed course of plavix.  Continue risk factor modification.       History of prostate cancer    Followed by urology.       HTN (hypertension)    Continue lisinopril.  Follow pressures.  Follow metabolic panel.  Blood pressure ok.       Hypercholesterolemia    Was previously on lipitor.  Once above sorted through, see if can start another statin medication.       Obsessive behavior    Family described him as being obsessed about his wife and sexual relationship as outlined.  Did not tolerate remeron.  On seroquel - low dose.  Is helping some.  D/w psychiatry regarding further medication.       PVD (peripheral vascular disease) ([place])    Carotid ultrasound 2021 - no hemodynamically significant stenosis - moderate heterogenous and calcified plaque. Continue risk factor modification.       Weakness    Declined more PT.  Really did not see that it made a difference.  Continue stretches/walking and exercise.  Uses a cane.  Follow.       Weight loss    Persistent weight loss as outlined.  Encourage increased po intake.  Discussed nutritional shakes in addition to his meals.  Follow.        Other Visit Diagnoses     Need for immunization against influenza    -  Primary   Relevant Orders   Flu Vaccine QUAD High Dose(Fluad) (Completed)         Einar Pheasant, MD

## 2021-04-21 ENCOUNTER — Other Ambulatory Visit: Payer: Self-pay

## 2021-04-21 DIAGNOSIS — R634 Abnormal weight loss: Secondary | ICD-10-CM

## 2021-04-21 DIAGNOSIS — D649 Anemia, unspecified: Secondary | ICD-10-CM

## 2021-04-23 ENCOUNTER — Encounter: Payer: Self-pay | Admitting: Internal Medicine

## 2021-04-23 NOTE — Assessment & Plan Note (Signed)
Family described him as being obsessed about his wife and sexual relationship as outlined.  Did not tolerate remeron.  On seroquel - low dose.  Is helping some.  D/w psychiatry regarding further medication.

## 2021-04-23 NOTE — Assessment & Plan Note (Signed)
History of right thalamic infarct.  Continue aspirin.  Completed course of plavix.  Continue risk factor modification.

## 2021-04-23 NOTE — Assessment & Plan Note (Signed)
Acid reflux - consider pepcid.

## 2021-04-23 NOTE — Assessment & Plan Note (Signed)
Noted on recent scan.  Saw neurology.  On seroquel.  Some improvement.  Follow

## 2021-04-23 NOTE — Assessment & Plan Note (Signed)
Continue lisinopril.  Follow pressures.  Follow metabolic panel.  Blood pressure ok.  

## 2021-04-23 NOTE — Assessment & Plan Note (Signed)
Declined more PT.  Really did not see that it made a difference.  Continue stretches/walking and exercise.  Uses a cane.  Follow.

## 2021-04-23 NOTE — Assessment & Plan Note (Signed)
Followed by urology.   

## 2021-04-23 NOTE — Assessment & Plan Note (Signed)
Persistent weight loss as outlined.  Encourage increased po intake.  Discussed nutritional shakes in addition to his meals.  Follow.

## 2021-04-23 NOTE — Assessment & Plan Note (Signed)
Carotid ultrasound 2021 - no hemodynamically significant stenosis - moderate heterogenous and calcified plaque. Continue risk factor modification.  

## 2021-04-23 NOTE — Assessment & Plan Note (Signed)
Was previously on lipitor.  Once above sorted through, see if can start another statin medication.

## 2021-04-24 ENCOUNTER — Telehealth: Payer: Self-pay | Admitting: Internal Medicine
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
## 2021-04-24 NOTE — Telephone Encounter (Signed)
Called patients wife. Unable to leave message

## 2021-04-25 ENCOUNTER — Telehealth: Payer: Self-pay | Admitting: Internal Medicine

## 2021-04-25 DIAGNOSIS — R4681 Obsessive-compulsive behavior: Secondary | ICD-10-CM

## 2021-04-25 MED ORDER — QUETIAPINE FUMARATE 50 MG PO TABS: 50.0000 mg | ORAL_TABLET | Freq: Every day | ORAL | 1 refills | Status: DC

## 2021-04-25 NOTE — Telephone Encounter (Signed)
Wife is agreeable to psychiatry. She is hesitant about increasing seroquel because he does sleep better at night on the 25 mg. She did agree to do so. II have sent in new rx. Her main complaint is that he becomes agitated and persistent more so during the day. She thinks that interaction with other people might would help him. Mentioned something like an adult daycare a couple days a week. Home health or ccm referral may be a good option? She is open to any suggestions that will help him. She says she tries to help him and keep up with him best she can but it is hard for her to do it alone. She says that she does not have as much trouble when their daughters are there but they cannot be there all the time.

## 2021-04-25 NOTE — Telephone Encounter (Signed)
Reviewed.  Seroquel rx has been sent in.  I am ok with CCM referral for social work, if daughters desire to pursue this.  Will need to clarify with daughters prior to ordering.

## 2021-04-25 NOTE — Telephone Encounter (Signed)
I did speak with psychiatry.  The recommended increasing his seroquel to 50mg  before bed.  Keep [place] posted.  Also, I do recommend the referral to psychiatry.  I can place an order for a new referral to [facility] if needed.  Can touch base with rasheedah and see if needed since we found out Dr Nicolasa Ducking does not take his insurance.

## 2021-04-25 NOTE — Telephone Encounter (Signed)
Rx for seroquel 50 mg qhs has been sent in. See other phone note for further documentation

## 2021-04-25 NOTE — Addendum Note (Signed)
Addended by: Lars Masson on: 04/25/2021 12:31 PM   Modules accepted: Orders

## 2021-04-28 NOTE — Telephone Encounter (Signed)
LMTCB

## 2021-05-01 NOTE — Telephone Encounter (Signed)
New referral placed for psychiatry. Holding on CCM referral until daughter gives ok to place

## 2021-05-01 NOTE — Addendum Note (Signed)
Addended by: Lars Masson on: 05/01/2021 02:18 PM   Modules accepted: Orders

## 2021-05-02 NOTE — Telephone Encounter (Signed)
I placed a new referral for psychiatry to [facility] because Todd Trujillo did not accept pts insurance. When I look in the referral tab, I cannot see it. Just wanted to confirm you were able to see it. Let me know if I need to do anything else.

## 2021-05-12 ENCOUNTER — Other Ambulatory Visit: Payer: Self-pay

## 2021-05-12 MED ORDER — SERTRALINE HCL 25 MG PO TABS: 25.0000 mg | ORAL_TABLET | Freq: Every day | ORAL | 0 refills | Status: DC

## 2021-05-12 NOTE — Telephone Encounter (Signed)
Please call and notify - would like to start zoloft 25mg  - take one tablet in the morning.  Continue the seroquel.  We will try this combination and see if this improves his behavior.

## 2021-05-12 NOTE — Telephone Encounter (Signed)
Patients daughter aware. Zoloft sent in. They will update [place] on how he is doing after starting zoloft.

## 2021-05-22 ENCOUNTER — Other Ambulatory Visit (INDEPENDENT_AMBULATORY_CARE_PROVIDER_SITE_OTHER): Payer: Medicare Other

## 2021-05-22 ENCOUNTER — Other Ambulatory Visit: Payer: Self-pay | Admitting: Internal Medicine

## 2021-05-22 ENCOUNTER — Other Ambulatory Visit: Payer: Self-pay

## 2021-05-22 DIAGNOSIS — E7800 Pure hypercholesterolemia, unspecified: Secondary | ICD-10-CM | POA: Diagnosis not present

## 2021-05-22 DIAGNOSIS — D649 Anemia, unspecified: Secondary | ICD-10-CM

## 2021-05-22 NOTE — Addendum Note (Signed)
Addended by: Leeanne Rio on: 05/22/2021 04:46 PM   Modules accepted: Orders

## 2021-05-22 NOTE — Addendum Note (Signed)
Addended by: Leeanne Rio on: 05/22/2021 03:41 PM   Modules accepted: Orders

## 2021-05-22 NOTE — Progress Notes (Signed)
Order placed for f/u labs.  

## 2021-05-23 LAB — CBC WITH DIFFERENTIAL/PLATELET
Basophils Absolute: 0.1 10*3/uL (ref 0.0–0.1)
Basophils Relative: 1.1 % (ref 0.0–3.0)
Eosinophils Absolute: 0.3 10*3/uL (ref 0.0–0.7)
Eosinophils Relative: 2.5 % (ref 0.0–5.0)
HCT: 30.2 % — ABNORMAL LOW (ref 39.0–52.0)
Hemoglobin: 9.6 g/dL — ABNORMAL LOW (ref 13.0–17.0)
Lymphocytes Relative: 31 % (ref 12.0–46.0)
Lymphs Abs: 3.1 10*3/uL (ref 0.7–4.0)
MCHC: 32 g/dL (ref 30.0–36.0)
MCV: 87.2 fl (ref 78.0–100.0)
Monocytes Absolute: 0.9 10*3/uL (ref 0.1–1.0)
Monocytes Relative: 9.2 % (ref 3.0–12.0)
Neutro Abs: 5.6 10*3/uL (ref 1.4–7.7)
Neutrophils Relative %: 56.2 % (ref 43.0–77.0)
Platelets: 310 10*3/uL (ref 150.0–400.0)
RBC: 3.46 Mil/uL — ABNORMAL LOW (ref 4.22–5.81)
RDW: 16.1 % — ABNORMAL HIGH (ref 11.5–15.5)
WBC: 10 10*3/uL (ref 4.0–10.5)

## 2021-05-23 LAB — IBC + FERRITIN
Ferritin: 38.1 ng/mL (ref 22.0–322.0)
Iron: 28 ug/dL — ABNORMAL LOW (ref 42–165)
Saturation Ratios: 11.6 % — ABNORMAL LOW (ref 20.0–50.0)
TIBC: 240.8 ug/dL — ABNORMAL LOW (ref 250.0–450.0)
Transferrin: 172 mg/dL — ABNORMAL LOW (ref 212.0–360.0)

## 2021-05-24 ENCOUNTER — Other Ambulatory Visit: Payer: Self-pay | Admitting: Internal Medicine

## 2021-05-24 DIAGNOSIS — D509 Iron deficiency anemia, unspecified: Secondary | ICD-10-CM

## 2021-05-24 NOTE — Progress Notes (Signed)
Order placed for hematology referral.  

## 2021-06-05 ENCOUNTER — Other Ambulatory Visit: Payer: Self-pay | Admitting: Internal Medicine

## 2021-06-06 ENCOUNTER — Encounter: Payer: Self-pay | Admitting: Emergency Medicine

## 2021-06-06 ENCOUNTER — Inpatient Hospital Stay
Admission: EM | Admit: 2021-06-06 | Discharge: 2021-06-15 | DRG: 956 | Disposition: A | Discharge status: Skilled Nursing Facility | Payer: Medicare Other | Attending: Internal Medicine | Admitting: Internal Medicine

## 2021-06-06 ENCOUNTER — Inpatient Hospital Stay: Payer: Medicare Other

## 2021-06-06 ENCOUNTER — Other Ambulatory Visit: Payer: Self-pay

## 2021-06-06 ENCOUNTER — Emergency Department: Payer: Medicare Other

## 2021-06-06 DIAGNOSIS — D72829 Elevated white blood cell count, unspecified: Secondary | ICD-10-CM | POA: Diagnosis not present

## 2021-06-06 DIAGNOSIS — Z800 Family history of malignant neoplasm of digestive organs: Secondary | ICD-10-CM

## 2021-06-06 DIAGNOSIS — Z85828 Personal history of other malignant neoplasm of skin: Secondary | ICD-10-CM | POA: Diagnosis not present

## 2021-06-06 DIAGNOSIS — Z8673 Personal history of transient ischemic attack (TIA), and cerebral infarction without residual deficits: Secondary | ICD-10-CM

## 2021-06-06 DIAGNOSIS — Z789 Other specified health status: Secondary | ICD-10-CM | POA: Diagnosis not present

## 2021-06-06 DIAGNOSIS — S72141A Displaced intertrochanteric fracture of right femur, initial encounter for closed fracture: Secondary | ICD-10-CM | POA: Diagnosis present

## 2021-06-06 DIAGNOSIS — R4182 Altered mental status, unspecified: Secondary | ICD-10-CM | POA: Diagnosis not present

## 2021-06-06 DIAGNOSIS — W19XXXA Unspecified fall, initial encounter: Secondary | ICD-10-CM

## 2021-06-06 DIAGNOSIS — Z7982 Long term (current) use of aspirin: Secondary | ICD-10-CM | POA: Diagnosis not present

## 2021-06-06 DIAGNOSIS — F03A3 Unspecified dementia, mild, with mood disturbance: Secondary | ICD-10-CM | POA: Diagnosis present

## 2021-06-06 DIAGNOSIS — D509 Iron deficiency anemia, unspecified: Secondary | ICD-10-CM | POA: Diagnosis present

## 2021-06-06 DIAGNOSIS — F03A4 Unspecified dementia, mild, with anxiety: Secondary | ICD-10-CM | POA: Diagnosis present

## 2021-06-06 DIAGNOSIS — D62 Acute posthemorrhagic anemia: Secondary | ICD-10-CM | POA: Diagnosis not present

## 2021-06-06 DIAGNOSIS — Z79899 Other long term (current) drug therapy: Secondary | ICD-10-CM | POA: Diagnosis not present

## 2021-06-06 DIAGNOSIS — J690 Pneumonitis due to inhalation of food and vomit: Secondary | ICD-10-CM | POA: Diagnosis not present

## 2021-06-06 DIAGNOSIS — Y92009 Unspecified place in unspecified non-institutional (private) residence as the place of occurrence of the external cause: Secondary | ICD-10-CM

## 2021-06-06 DIAGNOSIS — Z515 Encounter for palliative care: Secondary | ICD-10-CM | POA: Diagnosis not present

## 2021-06-06 DIAGNOSIS — R4701 Aphasia: Secondary | ICD-10-CM | POA: Diagnosis not present

## 2021-06-06 DIAGNOSIS — Z20822 Contact with and (suspected) exposure to covid-19: Secondary | ICD-10-CM | POA: Diagnosis present

## 2021-06-06 DIAGNOSIS — I739 Peripheral vascular disease, unspecified: Secondary | ICD-10-CM | POA: Diagnosis present

## 2021-06-06 DIAGNOSIS — G9340 Encephalopathy, unspecified: Secondary | ICD-10-CM | POA: Diagnosis not present

## 2021-06-06 DIAGNOSIS — E7800 Pure hypercholesterolemia, unspecified: Secondary | ICD-10-CM | POA: Diagnosis present

## 2021-06-06 DIAGNOSIS — K219 Gastro-esophageal reflux disease without esophagitis: Secondary | ICD-10-CM | POA: Diagnosis present

## 2021-06-06 DIAGNOSIS — I10 Essential (primary) hypertension: Secondary | ICD-10-CM | POA: Diagnosis present

## 2021-06-06 DIAGNOSIS — R0603 Acute respiratory distress: Secondary | ICD-10-CM | POA: Diagnosis not present

## 2021-06-06 DIAGNOSIS — Z87442 Personal history of urinary calculi: Secondary | ICD-10-CM

## 2021-06-06 DIAGNOSIS — J9601 Acute respiratory failure with hypoxia: Secondary | ICD-10-CM | POA: Diagnosis not present

## 2021-06-06 DIAGNOSIS — Z66 Do not resuscitate: Secondary | ICD-10-CM | POA: Diagnosis not present

## 2021-06-06 DIAGNOSIS — S065X0A Traumatic subdural hemorrhage without loss of consciousness, initial encounter: Secondary | ICD-10-CM | POA: Diagnosis present

## 2021-06-06 DIAGNOSIS — S72001A Fracture of unspecified part of neck of right femur, initial encounter for closed fracture: Secondary | ICD-10-CM

## 2021-06-06 DIAGNOSIS — N400 Enlarged prostate without lower urinary tract symptoms: Secondary | ICD-10-CM | POA: Diagnosis present

## 2021-06-06 DIAGNOSIS — H3530 Unspecified macular degeneration: Secondary | ICD-10-CM | POA: Diagnosis present

## 2021-06-06 DIAGNOSIS — S7291XA Unspecified fracture of right femur, initial encounter for closed fracture: Secondary | ICD-10-CM | POA: Diagnosis present

## 2021-06-06 DIAGNOSIS — S72009A Fracture of unspecified part of neck of unspecified femur, initial encounter for closed fracture: Secondary | ICD-10-CM

## 2021-06-06 DIAGNOSIS — S065XAA Traumatic subdural hemorrhage with loss of consciousness status unknown, initial encounter: Secondary | ICD-10-CM | POA: Diagnosis not present

## 2021-06-06 DIAGNOSIS — R1310 Dysphagia, unspecified: Secondary | ICD-10-CM | POA: Diagnosis not present

## 2021-06-06 DIAGNOSIS — H9190 Unspecified hearing loss, unspecified ear: Secondary | ICD-10-CM | POA: Diagnosis present

## 2021-06-06 DIAGNOSIS — Z9079 Acquired absence of other genital organ(s): Secondary | ICD-10-CM

## 2021-06-06 DIAGNOSIS — Z8546 Personal history of malignant neoplasm of prostate: Secondary | ICD-10-CM

## 2021-06-06 DIAGNOSIS — F0390 Unspecified dementia without behavioral disturbance: Secondary | ICD-10-CM | POA: Diagnosis not present

## 2021-06-06 DIAGNOSIS — I639 Cerebral infarction, unspecified: Secondary | ICD-10-CM | POA: Diagnosis present

## 2021-06-06 DIAGNOSIS — Z9181 History of falling: Secondary | ICD-10-CM

## 2021-06-06 DIAGNOSIS — W010XXA Fall on same level from slipping, tripping and stumbling without subsequent striking against object, initial encounter: Secondary | ICD-10-CM | POA: Diagnosis present

## 2021-06-06 DIAGNOSIS — T17908D Unspecified foreign body in respiratory tract, part unspecified causing other injury, subsequent encounter: Secondary | ICD-10-CM | POA: Diagnosis not present

## 2021-06-06 DIAGNOSIS — T17908A Unspecified foreign body in respiratory tract, part unspecified causing other injury, initial encounter: Secondary | ICD-10-CM

## 2021-06-06 LAB — CBC WITH DIFFERENTIAL/PLATELET
Abs Immature Granulocytes: 0.1 10*3/uL — ABNORMAL HIGH (ref 0.00–0.07)
Basophils Absolute: 0.1 10*3/uL (ref 0.0–0.1)
Basophils Relative: 1 %
Eosinophils Absolute: 0 10*3/uL (ref 0.0–0.5)
Eosinophils Relative: 0 %
HCT: 29.2 % — ABNORMAL LOW (ref 39.0–52.0)
Hemoglobin: 9.1 g/dL — ABNORMAL LOW (ref 13.0–17.0)
Immature Granulocytes: 1 %
Lymphocytes Relative: 10 %
Lymphs Abs: 1.3 10*3/uL (ref 0.7–4.0)
MCH: 27.5 pg (ref 26.0–34.0)
MCHC: 31.2 g/dL (ref 30.0–36.0)
MCV: 88.2 fL (ref 80.0–100.0)
Monocytes Absolute: 0.7 10*3/uL (ref 0.1–1.0)
Monocytes Relative: 6 %
Neutro Abs: 11.2 10*3/uL — ABNORMAL HIGH (ref 1.7–7.7)
Neutrophils Relative %: 82 %
Platelets: 333 10*3/uL (ref 150–400)
RBC: 3.31 MIL/uL — ABNORMAL LOW (ref 4.22–5.81)
RDW: 15.1 % (ref 11.5–15.5)
WBC: 13.5 10*3/uL — ABNORMAL HIGH (ref 4.0–10.5)
nRBC: 0 % (ref 0.0–0.2)

## 2021-06-06 LAB — PROTIME-INR
INR: 1.5 — ABNORMAL HIGH (ref 0.8–1.2)
Prothrombin Time: 18.2 seconds — ABNORMAL HIGH (ref 11.4–15.2)

## 2021-06-06 LAB — COMPREHENSIVE METABOLIC PANEL
ALT: 9 U/L (ref 0–44)
AST: 14 U/L — ABNORMAL LOW (ref 15–41)
Albumin: 3.4 g/dL — ABNORMAL LOW (ref 3.5–5.0)
Alkaline Phosphatase: 96 U/L (ref 38–126)
Anion gap: 7 (ref 5–15)
BUN: 23 mg/dL (ref 8–23)
CO2: 25 mmol/L (ref 22–32)
Calcium: 8.8 mg/dL — ABNORMAL LOW (ref 8.9–10.3)
Chloride: 104 mmol/L (ref 98–111)
Creatinine, Ser: 0.79 mg/dL (ref 0.61–1.24)
GFR, Estimated: >60 mL/min (ref >60)
Glucose, Bld: 134 mg/dL — ABNORMAL HIGH (ref 70–99)
Potassium: 4 mmol/L (ref 3.5–5.1)
Sodium: 136 mmol/L (ref 135–145)
Total Bilirubin: 0.5 mg/dL (ref 0.3–1.2)
Total Protein: 6.4 g/dL — ABNORMAL LOW (ref 6.5–8.1)

## 2021-06-06 LAB — TROPONIN I (HIGH SENSITIVITY): Troponin I (High Sensitivity): 7 ng/L (ref <18)

## 2021-06-06 LAB — RESP PANEL BY RT-PCR (FLU A&B, COVID) ARPGX2
Influenza A by PCR: NEGATIVE
Influenza B by PCR: NEGATIVE
SARS Coronavirus 2 by RT PCR: NEGATIVE

## 2021-06-06 MED ORDER — CEFAZOLIN SODIUM-DEXTROSE 2-4 GM/100ML-% IV SOLN
2.0000 g | INTRAVENOUS | Status: AC
  Administered 2021-06-07: 2 g via INTRAVENOUS
  Filled 2021-06-06: qty 100

## 2021-06-06 MED ORDER — ONDANSETRON HCL 4 MG/2ML IJ SOLN
4.0000 mg | Freq: Four times a day (QID) | INTRAMUSCULAR | Status: DC | PRN
  Administered 2021-06-07: 4 mg via INTRAVENOUS
  Filled 2021-06-06: qty 2

## 2021-06-06 MED ORDER — LACTATED RINGERS IV SOLN: INTRAVENOUS | Status: DC

## 2021-06-06 MED ORDER — ONDANSETRON HCL 4 MG PO TABS: 4.0000 mg | ORAL_TABLET | Freq: Four times a day (QID) | ORAL | Status: DC | PRN

## 2021-06-06 MED ORDER — KETOROLAC TROMETHAMINE 30 MG/ML IJ SOLN
15.0000 mg | Freq: Four times a day (QID) | INTRAMUSCULAR | Status: AC | PRN
  Administered 2021-06-07 – 2021-06-11 (×5): 15 mg via INTRAVENOUS
  Filled 2021-06-06 (×5): qty 1

## 2021-06-06 NOTE — H&P (Signed)
History and Physical   Todd Trujillo. KPT:[id number] DOB: 1930/02/27 DOA: 06/06/2021  Referring MD/NP/PA: Dr. Fonnie Birkenhead  PCP: Einar Pheasant, MD   Outpatient Specialists: None  Patient coming from: Home  Chief Complaint: Fall with right fracture  HPI: Todd Trujillo. is a 87 y.o. male with medical history significant of dementia, hyperlipidemia, hypertension, macular degeneration of the eye, BPH, squamous cell carcinoma of the skin who had a mechanical fall at home and came in with right femoral neck fracture.  Patient apparently tripped and fell landing on the right side.  Denied hitting his head.  Denied any dizziness prior to the event denied any other significant injury.  Patient was seen and evaluated in the ER.  Work-up showed right intertrochanteric femur fracture.  No other fractures or injury.  Patient appears hemodynamically stable except for the pain.  Orthopedic consulted and patient being admitted for operative repair.  Patient denied any other complaints at the moment..  ED Course: Temperature 98.3 blood pressure 142/74, pulse 74 respiratory rate 18 oxygen sats 98% on room air.  White count is 13.5 hemoglobin 9.1 and platelet 333.  Chemistry largely within normal except calcium 8.8.  INR 1.5 and glucose 134.  Head CT without contrast showed chronic ischemic changes otherwise no new findings.  Chest x-ray showed no acute findings.  X-ray of the right hip showed mildly displaced intertrochanter fracture of the right femoral neck.  Patient will be admitted for further evaluation and treatment  Review of Systems: As per HPI otherwise 10 point review of systems negative.    Past Medical History:  Diagnosis Date   Arthritis    Cancer [facility])    prostate   Hyperlipidemia    Hypertension    Left wrist fracture    Macular degeneration of right eye    Nephrolithiasis    Prostate CA ([place]) 2003   Prostate troubles    Patient was unsure of term    Squamous cell carcinoma of skin 01/23/2017   R distal lat bicep near elbow    Past Surgical History:  Procedure Laterality Date   TOTAL KNEE ARTHROPLASTY       reports that he has never smoked. He has never used smokeless tobacco. He reports that he does not currently use drugs. He reports that he does not drink alcohol.  Allergies  Allergen Reactions   Codeine Nausea And Vomiting and Nausea Only    Other reaction(s): Vomiting    Family History  Problem Relation Age of Onset   Colon cancer Mother      Prior to Admission medications   Medication Sig Start Date End Date Taking? Authorizing Provider  aspirin EC 81 MG tablet Take 1 tablet (81 mg total) by mouth daily. Swallow whole. 09/21/20   Einar Pheasant, MD  lisinopril (ZESTRIL) 20 MG tablet Take 0.5 tablets (10 mg total) by mouth daily. 12/16/20   Einar Pheasant, MD  mupirocin ointment (BACTROBAN) 2 % Apply 1 application topically 2 (two) times daily. 01/11/21   Ralene Bathe, MD  QUEtiapine (SEROQUEL) 50 MG tablet Take 1 tablet (50 mg total) by mouth at bedtime. 04/25/21   Einar Pheasant, MD  sertraline (ZOLOFT) 25 MG tablet TAKE 1 TABLET BY MOUTH DAILY. 06/05/21   Einar Pheasant, MD    Physical Exam: Vitals:   06/06/21 1946 06/06/21 2048  BP: (!) 142/74   Pulse: 74   Resp: 18   Temp: 98.3 F (36.8 C)   TempSrc: Oral  SpO2: 98%   Weight:  65 kg      Constitutional: Acutely ill looking, in mild distress due to pain Vitals:   06/06/21 1946 06/06/21 2048  BP: (!) 142/74   Pulse: 74   Resp: 18   Temp: 98.3 F (36.8 C)   TempSrc: Oral   SpO2: 98%   Weight:  65 kg   Eyes: PERRL, lids and conjunctivae normal ENMT: Mucous membranes are moist. Posterior pharynx clear of any exudate or lesions.Normal dentition.  Neck: normal, supple, no masses, no thyromegaly Respiratory: clear to auscultation bilaterally, no wheezing, no crackles. Normal respiratory effort. No accessory muscle use.  Cardiovascular: Regular  rate and rhythm, no murmurs / rubs / gallops. No extremity edema. 2+ pedal pulses. No carotid bruits.  Abdomen: no tenderness, no masses palpated. No hepatosplenomegaly. Bowel sounds positive.  Musculoskeletal: no clubbing / cyanosis.  Laterally rotated right lower extremity with shortening,. Normal muscle tone.  Skin: no rashes, lesions, ulcers. No induration Neurologic: CN 2-12 grossly intact. Sensation intact, DTR normal. Strength 5/5 in all 4.  Psychiatric: Mildly confused but fully awake and alert.     Labs on Admission: I have personally reviewed following labs and imaging studies  CBC: Recent Labs  Lab 06/06/21 2148  WBC 13.5*  NEUTROABS 11.2*  HGB 9.1*  HCT 29.2*  MCV 88.2  PLT 923   Basic Metabolic Panel: Recent Labs  Lab 06/06/21 2148  NA 136  K 4.0  CL 104  CO2 25  GLUCOSE 134*  BUN 23  CREATININE 0.79  CALCIUM 8.8*   GFR: Estimated Creatinine Clearance: 57.6 mL/min (by C-G formula based on SCr of 0.79 mg/dL). Liver Function Tests: Recent Labs  Lab 06/06/21 2148  AST 14*  ALT 9  ALKPHOS 96  BILITOT 0.5  PROT 6.4*  ALBUMIN 3.4*   No results for input(s): LIPASE, AMYLASE in the last 168 hours. No results for input(s): AMMONIA in the last 168 hours. Coagulation Profile: Recent Labs  Lab 06/06/21 2148  INR 1.5*   Cardiac Enzymes: No results for input(s): CKTOTAL, CKMB, CKMBINDEX, TROPONINI in the last 168 hours. BNP (last 3 results) No results for input(s): PROBNP in the last 8760 hours. HbA1C: No results for input(s): HGBA1C in the last 72 hours. CBG: No results for input(s): GLUCAP in the last 168 hours. Lipid Profile: No results for input(s): CHOL, HDL, LDLCALC, TRIG, CHOLHDL, LDLDIRECT in the last 72 hours. Thyroid Function Tests: No results for input(s): TSH, T4TOTAL, FREET4, T3FREE, THYROIDAB in the last 72 hours. Anemia Panel: No results for input(s): VITAMINB12, FOLATE, FERRITIN, TIBC, IRON, RETICCTPCT in the last 72 hours. Urine  analysis:    Component Value Date/Time   COLORURINE YELLOW (A) 02/27/2021 1256   APPEARANCEUR HAZY (A) 02/27/2021 1256   LABSPEC 1.021 02/27/2021 1256   PHURINE 5.0 02/27/2021 1256   GLUCOSEU NEGATIVE 02/27/2021 1256   HGBUR NEGATIVE 02/27/2021 1256   BILIRUBINUR NEGATIVE 02/27/2021 1256   KETONESUR 5 (A) 02/27/2021 1256   PROTEINUR NEGATIVE 02/27/2021 1256   NITRITE NEGATIVE 02/27/2021 1256   LEUKOCYTESUR NEGATIVE 02/27/2021 1256   Sepsis Labs: @LABRCNTIP (procalcitonin:4,lacticidven:4) ) Recent Results (from the past 240 hour(s))  Resp Panel by RT-PCR (Flu A&B, Covid) Nasopharyngeal Swab     Status: None   Collection Time: 06/06/21 10:38 PM   Specimen: Nasopharyngeal Swab; Nasopharyngeal(NP) swabs in vial transport medium  Result Value Ref Range Status   SARS Coronavirus 2 by RT PCR NEGATIVE NEGATIVE Final    Comment: (NOTE) SARS-CoV-2 target nucleic  acids are NOT DETECTED.  The SARS-CoV-2 RNA is generally detectable in upper respiratory specimens during the acute phase of infection. The lowest concentration of SARS-CoV-2 viral copies this assay can detect is 138 copies/mL. A negative result does not preclude SARS-Cov-2 infection and should not be used as the sole basis for treatment or other patient management decisions. A negative result may occur with  improper specimen collection/handling, submission of specimen other than nasopharyngeal swab, presence of viral mutation(s) within the areas targeted by this assay, and inadequate number of viral copies(<138 copies/mL). A negative result must be combined with clinical observations, patient history, and epidemiological information. The expected result is Negative.  Fact Sheet for Patients:  EntrepreneurPulse.com.au  Fact Sheet for Healthcare Providers:  IncredibleEmployment.be  This test is no t yet approved or cleared by the [place] FDA and  has been authorized for detection  and/or diagnosis of SARS-CoV-2 by FDA under an Emergency Use Authorization (EUA). This EUA will remain  in effect (meaning this test can be used) for the duration of the COVID-19 declaration under Section 564(b)(1) of the Act, 21 U.S.C.section 360bbb-3(b)(1), unless the authorization is terminated  or revoked sooner.       Influenza A by PCR NEGATIVE NEGATIVE Final   Influenza B by PCR NEGATIVE NEGATIVE Final    Comment: (NOTE) The Xpert Xpress SARS-CoV-2/FLU/RSV plus assay is intended as an aid in the diagnosis of influenza from Nasopharyngeal swab specimens and should not be used as a sole basis for treatment. Nasal washings and aspirates are unacceptable for Xpert Xpress SARS-CoV-2/FLU/RSV testing.  Fact Sheet for Patients: EntrepreneurPulse.com.au  Fact Sheet for Healthcare Providers: IncredibleEmployment.be  This test is not yet approved or cleared by the [place] FDA and has been authorized for detection and/or diagnosis of SARS-CoV-2 by FDA under an Emergency Use Authorization (EUA). This EUA will remain in effect (meaning this test can be used) for the duration of the COVID-19 declaration under Section 564(b)(1) of the Act, 21 U.S.C. section 360bbb-3(b)(1), unless the authorization is terminated or revoked.  Performed at [facility], [address] [place],  [address]      Radiological Exams on Admission: CT HEAD WO CONTRAST (5MM)  Result Date: 06/06/2021 CLINICAL DATA:  Witnessed fall with headaches, initial encounter EXAM: CT HEAD WITHOUT CONTRAST TECHNIQUE: Contiguous axial images were obtained from the base of the skull through the vertex without intravenous contrast. COMPARISON:  09/12/2019 FINDINGS: Brain: No evidence of acute infarction, hemorrhage, hydrocephalus, extra-axial collection or mass lesion/mass effect. Chronic atrophic and ischemic changes are noted stable from the prior exam. Vascular: No  hyperdense vessel or unexpected calcification. Skull: Normal. Negative for fracture or focal lesion. Sinuses/Orbits: Orbits are within normal limits. Paranasal sinuses are unremarkable. Mild left mastoid effusion is seen new from the prior exam. Other: None IMPRESSION: Chronic atrophic and ischemic changes stable from the prior study. Mild left mastoid effusion. Electronically Signed   By: Inez Catalina M.D.   On: 06/06/2021 23:29   DG Chest Port 1 View  Result Date: 06/06/2021 CLINICAL DATA:  Fall. EXAM: PORTABLE CHEST 1 VIEW COMPARISON:  Chest radiograph dated 02/27/2021. FINDINGS: The lungs are clear. There is no pleural effusion pneumothorax. The cardiac silhouette is within limits. Atherosclerotic calcification of the aorta. Degenerative changes of the spine. No acute osseous pathology. IMPRESSION: No active disease. Electronically Signed   By: Anner Crete M.D.   On: 06/06/2021 21:01   DG Hip Unilat W or Wo Pelvis 2-3 Views Right  Result Date: 06/06/2021 CLINICAL DATA:  Fall and right hip pain. EXAM: DG HIP (WITH OR WITHOUT PELVIS) 2-3V RIGHT COMPARISON:  None. FINDINGS: Mildly displaced intertrochanteric fracture of the right femoral neck with mild proximal migration of the femoral shaft in relation to the head of the femur. No dislocation. The bones are osteopenic. Degenerative changes of the lower lumbar spine. Prostate brachytherapy seeds. IMPRESSION: Mildly displaced intertrochanteric fracture of the right femoral neck. Electronically Signed   By: Anner Crete M.D.   On: 06/06/2021 20:58    EKG: Independently reviewed.  Normal sinus rhythm no acute findings  Assessment/Plan Principal Problem:   Right femoral fracture (HCC) Active Problems:   Hypercholesterolemia   Benign prostatic hyperplasia   HTN (hypertension)   GERD (gastroesophageal reflux disease)   Cerebrovascular accident (CVA) ([place])   Leucocytosis     #1 right mildly displaced intertrochanteric fracture: Patient  will be admitted.  Pain management.  Supportive care.  Orthopedics consulted for surgical repair of hopefully tomorrow.  Patient appears medically stable with no contraindications to continuing with surgery tomorrow.  Patient will be kept n.p.o. after midnight.  Avoid anticoagulation tonight.  #2 essential hypertension: Resume home regimen once confirmed.  Monitor closely.  Patient supposed to be on lisinopril at home  #3 mild dementia: No agitation.  Continue to monitor.  #4 status post fall: Mechanical in nature.  PT and OT consultation.  May require rehab.  #5 hyperlipidemia: Patient is not on a statin at home.  Continue to monitor  #6 BPH: No active treatment.  Continue to monitor  #7 history of CVA: Patient on aspirin.  #8 depression with anxiety: Continue Zoloft.  DVT prophylaxis: SCD but Lovenox postoperatively Code Status: Full code Family Communication: No family at bedside Disposition Plan: To be determined Consults called: Dr. Mack Guise, orthopedics Admission status: Inpatient  Severity of Illness: The appropriate patient status for this patient is INPATIENT. Inpatient status is judged to be reasonable and necessary in order to provide the required intensity of service to ensure the patient's safety. The patient's presenting symptoms, physical exam findings, and initial radiographic and laboratory data in the context of their chronic comorbidities is felt to place them at high risk for further clinical deterioration. Furthermore, it is not anticipated that the patient will be medically stable for discharge from the hospital within 2 midnights of admission.   * I certify that at the point of admission it is my clinical judgment that the patient will require inpatient hospital care spanning beyond 2 midnights from the point of admission due to high intensity of service, high risk for further deterioration and high frequency of surveillance required.Barbette Merino MD [facility] Pager [phone]  If 7PM-7AM, please contact night-coverage [email or web address] Password [facility]  06/06/2021, 11:52 PM

## 2021-06-06 NOTE — Consult Note (Signed)
Called by Dr. Myrene Buddy regarding this 87 year old male with a right intertrochanteric hip fracture after a fall at home today.    I have reviewed xrays demonstrating a displaced right intertrochanteric hip fracture.   Patient will be admitted to medicine for pre-op clearance.    Patient is not an anticoagulants.   Plan for surgery tomorrow.  Npo after midnight.   NO anticoagulation overnight in preparation for surgery tomorrow.

## 2021-06-06 NOTE — ED Provider Notes (Signed)
[facility] Emergency Department Provider Note  ____________________________________________   Event Date/Time   First MD Initiated Contact with Patient 06/06/21 2142     (approximate)  I have reviewed the triage vital signs and the nursing notes.   HISTORY  Chief Complaint Fall and Hip Pain    HPI Todd Trujillo. is a 87 y.o. male with past medical history as below here with fall and hip pain.  The patient states that he lost his footing at home this afternoon.  He fell directly onto his right hip.  He has since had aching, throbbing, severe pain.  The pain is worse with any kind of movement.  Been unable to ambulate.  Does not believe he hit his head.  He had no shoulder or chest pain.  No pain with deep inspiration.  No other injuries.  Patient has history of mild dementia, is fairly immobile at baseline and is supposed to use a walker, though he often forgets.    Past Medical History:  Diagnosis Date   Arthritis    Cancer [facility])    prostate   Hyperlipidemia    Hypertension    Left wrist fracture    Macular degeneration of right eye    Nephrolithiasis    Prostate CA ([place]) 2003   Prostate troubles    Patient was unsure of term   Squamous cell carcinoma of skin 01/23/2017   R distal lat bicep near elbow    Patient Active Problem List   Diagnosis Date Noted   Right femoral fracture ([place]) 06/06/2021   Obsessive behavior 03/11/2021   History of CVA (cerebrovascular accident) 09/21/2020   Renal cyst 09/21/2020   Weakness 09/21/2020   Cerebral atrophy ([place]) 09/15/2020   Cerebrovascular accident (CVA) ([place])    Left facial numbness 09/12/2019   HTN (hypertension) 09/12/2019   GERD (gastroesophageal reflux disease) 09/12/2019   Arthritis 08/13/2018   Benign prostatic hyperplasia 08/13/2018   Diverticulosis 08/13/2018   History of anemia 08/13/2018   History of colonic polyps 08/13/2018   History of nephrolithiasis 08/13/2018    History of pneumonia 08/13/2018   Weight loss 12/15/2016   Healthcare maintenance 12/14/2016   Skin lesion of chest wall 09/23/2016   Anemia 09/17/2016   Hypercholesterolemia 09/17/2016   Incomplete emptying of bladder 02/02/2016   TIA (transient ischemic attack) 01/08/2016   Macular degeneration 01/08/2016   History of prostate cancer 01/08/2016   PVD (peripheral vascular disease) ([place]) 10/12/2015   SCC (squamous cell carcinoma) 01/24/2015   Bladder outlet obstruction 04/15/2013   Acquired cyst of kidney 04/16/2012   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/2012    Past Surgical History:  Procedure Laterality Date   TOTAL KNEE ARTHROPLASTY      Prior to Admission medications   Medication Sig Start Date End Date Taking? Authorizing Provider  aspirin EC 81 MG tablet Take 1 tablet (81 mg total) by mouth daily. Swallow whole. 09/21/20   Einar Pheasant, MD  lisinopril (ZESTRIL) 20 MG tablet Take 0.5 tablets (10 mg total) by mouth daily. 12/16/20   Einar Pheasant, MD  mupirocin ointment (BACTROBAN) 2 % Apply 1 application topically 2 (two) times daily. 01/11/21   Ralene Bathe, MD  QUEtiapine (SEROQUEL) 50 MG tablet Take 1 tablet (50 mg total) by mouth at bedtime. 04/25/21   Einar Pheasant, MD  sertraline (ZOLOFT) 25 MG tablet TAKE 1 TABLET BY MOUTH DAILY. 06/05/21   Einar Pheasant, MD    Allergies Codeine  Family  History  Problem Relation Age of Onset   Colon cancer Mother     Social History Social History   Tobacco Use   Smoking status: Never   Smokeless tobacco: Never  Substance Use Topics   Alcohol use: No   Drug use: Not Currently    Review of Systems  Review of Systems  Constitutional:  Negative for chills and fever.  HENT:  Negative for sore throat.   Respiratory:  Negative for shortness of breath.   Cardiovascular:  Negative for chest pain.  Gastrointestinal:  Negative for abdominal pain.  Genitourinary:  Negative for flank pain.   Musculoskeletal:  Positive for arthralgias and gait problem. Negative for neck pain.  Skin:  Negative for rash and wound.  Allergic/Immunologic: Negative for immunocompromised state.  Neurological:  Negative for weakness and numbness.  Hematological:  Does not bruise/bleed easily.  All other systems reviewed and are negative.   ____________________________________________  PHYSICAL EXAM:      VITAL SIGNS: ED Triage Vitals  Enc Vitals Group     BP 06/06/21 1946 (!) 142/74     Pulse Rate 06/06/21 1946 74     Resp 06/06/21 1946 18     Temp 06/06/21 1946 98.3 F (36.8 C)     Temp Source 06/06/21 1946 Oral     SpO2 06/06/21 1946 98 %     Weight 06/06/21 2048 143 lb 4.8 oz (65 kg)     Height --      Head Circumference --      Peak Flow --      Pain Score --      Pain Loc --      Pain Edu? --      Excl. in [place]? --      Physical Exam Vitals and nursing note reviewed.  Constitutional:      General: He is not in acute distress.    Appearance: He is well-developed.  HENT:     Head: Normocephalic and atraumatic.  Eyes:     Conjunctiva/sclera: Conjunctivae normal.  Cardiovascular:     Rate and Rhythm: Normal rate and regular rhythm.     Heart sounds: Normal heart sounds.  Pulmonary:     Effort: Pulmonary effort is normal. No respiratory distress.     Breath sounds: No wheezing.  Abdominal:     General: There is no distension.  Musculoskeletal:     Cervical back: Neck supple.     Comments: Marked tenderness with any range of motion of the right hip.  There is slight shortening and external rotation.  Distal strength and sensation is fully intact.  Skin:    General: Skin is warm.     Capillary Refill: Capillary refill takes less than 2 seconds.     Findings: No rash.  Neurological:     Mental Status: He is alert and oriented to person, place, and time.     Motor: No abnormal muscle tone.      ____________________________________________   LABS (all labs ordered are  listed, but only abnormal results are displayed)  Labs Reviewed  CBC WITH DIFFERENTIAL/PLATELET - Abnormal; Notable for the following components:      Result Value   WBC 13.5 (*)    RBC 3.31 (*)    Hemoglobin 9.1 (*)    HCT 29.2 (*)    Neutro Abs 11.2 (*)    Abs Immature Granulocytes 0.10 (*)    All other components within normal limits  COMPREHENSIVE METABOLIC PANEL - Abnormal;  Notable for the following components:   Glucose, Bld 134 (*)    Calcium 8.8 (*)    Total Protein 6.4 (*)    Albumin 3.4 (*)    AST 14 (*)    All other components within normal limits  PROTIME-INR - Abnormal; Notable for the following components:   Prothrombin Time 18.2 (*)    INR 1.5 (*)    All other components within normal limits  RESP PANEL BY RT-PCR (FLU A&B, COVID) ARPGX2  TROPONIN I (HIGH SENSITIVITY)    ____________________________________________  EKG:  ________________________________________  RADIOLOGY All imaging, including plain films, CT scans, and ultrasounds, independently reviewed by me, and interpretations confirmed via formal radiology reads.  ED MD interpretation:   Chest x-ray: No active disease DG hip right: Mildly displaced intertrochanteric fracture  Official radiology report(s): DG Chest Port 1 View  Result Date: 06/06/2021 CLINICAL DATA:  Fall. EXAM: PORTABLE CHEST 1 VIEW COMPARISON:  Chest radiograph dated 02/27/2021. FINDINGS: The lungs are clear. There is no pleural effusion pneumothorax. The cardiac silhouette is within limits. Atherosclerotic calcification of the aorta. Degenerative changes of the spine. No acute osseous pathology. IMPRESSION: No active disease. Electronically Signed   By: Anner Crete M.D.   On: 06/06/2021 21:01   DG Hip Unilat W or Wo Pelvis 2-3 Views Right  Result Date: 06/06/2021 CLINICAL DATA:  Fall and right hip pain. EXAM: DG HIP (WITH OR WITHOUT PELVIS) 2-3V RIGHT COMPARISON:  None. FINDINGS: Mildly displaced intertrochanteric fracture  of the right femoral neck with mild proximal migration of the femoral shaft in relation to the head of the femur. No dislocation. The bones are osteopenic. Degenerative changes of the lower lumbar spine. Prostate brachytherapy seeds. IMPRESSION: Mildly displaced intertrochanteric fracture of the right femoral neck. Electronically Signed   By: Anner Crete M.D.   On: 06/06/2021 20:58    ____________________________________________  PROCEDURES   Procedure(s) performed (including Critical Care):  Procedures  ____________________________________________  INITIAL IMPRESSION / MDM / [place] / ED COURSE  As part of my medical decision making, I reviewed the following data within the [place] notes reviewed and incorporated, Old chart reviewed, Notes from prior ED visits, and [place] Controlled Substance [place]. was evaluated in Emergency Department on 06/06/2021 for the symptoms described in the history of present illness. He was evaluated in the context of the global COVID-19 pandemic, which necessitated consideration that the patient might be at risk for infection with the SARS-CoV-2 virus that causes COVID-19. Institutional protocols and algorithms that pertain to the evaluation of patients at risk for COVID-19 are in a state of rapid change based on information released by regulatory bodies including the CDC and federal and state organizations. These policies and algorithms were followed during the patient's care in the ED.  Some ED evaluations and interventions may be delayed as a result of limited staffing during the pandemic.*     Medical Decision Making: 87 year old male here with right intertrochanteric hip fracture after mechanical fall.  Patient is otherwise stable.  He is not on anticoagulation.  He denies any illness preceding the fall.  CBC shows mild anemia.  CMP unremarkable.  Troponin negative.  Chest x-ray  clear.  Discussed with Dr. Mack Guise, will plan to admit to medicine.  Patient should be n.p.o. at midnight, though there is reportedly a very busy schedule, so timing of surgery unknown.  Patient updated and in agreement.  ____________________________________________  FINAL CLINICAL IMPRESSION(S) / ED DIAGNOSES  Final diagnoses:  Closed displaced intertrochanteric fracture of right femur, initial encounter ([place])  Fall, initial encounter     MEDICATIONS GIVEN DURING THIS VISIT:  Medications  ceFAZolin (ANCEF) IVPB 2g/100 mL premix (has no administration in time range)     ED Discharge Orders     None        Note:  This document was prepared using Dragon voice recognition software and may include unintentional dictation errors.   Duffy Bruce, MD 06/06/21 [phone]

## 2021-06-06 NOTE — ED Triage Notes (Signed)
Pt in from home via [place] after witnessed fall at home. Pt has dementia, and had trip fall today, landed on R side, injuring R hip. Denies any LOC, no thinners. No other injuries reported

## 2021-06-07 ENCOUNTER — Encounter
Admission: EM | Disposition: A | Discharge status: Skilled Nursing Facility | Payer: Self-pay | Source: Home / Self Care | Attending: Internal Medicine

## 2021-06-07 ENCOUNTER — Inpatient Hospital Stay: Payer: Medicare Other | Admitting: Certified Registered Nurse Anesthetist

## 2021-06-07 ENCOUNTER — Encounter: Payer: Self-pay | Admitting: Internal Medicine

## 2021-06-07 ENCOUNTER — Inpatient Hospital Stay: Payer: Medicare Other

## 2021-06-07 ENCOUNTER — Telehealth: Payer: Self-pay | Admitting: Internal Medicine

## 2021-06-07 ENCOUNTER — Inpatient Hospital Stay: Payer: Medicare Other | Admitting: Internal Medicine

## 2021-06-07 DIAGNOSIS — I10 Essential (primary) hypertension: Secondary | ICD-10-CM | POA: Diagnosis not present

## 2021-06-07 DIAGNOSIS — D72829 Elevated white blood cell count, unspecified: Secondary | ICD-10-CM | POA: Diagnosis not present

## 2021-06-07 DIAGNOSIS — W19XXXA Unspecified fall, initial encounter: Secondary | ICD-10-CM

## 2021-06-07 DIAGNOSIS — S72141A Displaced intertrochanteric fracture of right femur, initial encounter for closed fracture: Secondary | ICD-10-CM | POA: Diagnosis not present

## 2021-06-07 HISTORY — PX: INTRAMEDULLARY (IM) NAIL INTERTROCHANTERIC: SHX5875

## 2021-06-07 LAB — CBC
HCT: 27.8 % — ABNORMAL LOW (ref 39.0–52.0)
Hemoglobin: 9 g/dL — ABNORMAL LOW (ref 13.0–17.0)
MCH: 28 pg (ref 26.0–34.0)
MCHC: 32.4 g/dL (ref 30.0–36.0)
MCV: 86.3 fL (ref 80.0–100.0)
Platelets: 309 10*3/uL (ref 150–400)
RBC: 3.22 MIL/uL — ABNORMAL LOW (ref 4.22–5.81)
RDW: 15 % (ref 11.5–15.5)
WBC: 14.2 10*3/uL — ABNORMAL HIGH (ref 4.0–10.5)
nRBC: 0 % (ref 0.0–0.2)

## 2021-06-07 LAB — COMPREHENSIVE METABOLIC PANEL
ALT: 8 U/L (ref 0–44)
AST: 16 U/L (ref 15–41)
Albumin: 3.1 g/dL — ABNORMAL LOW (ref 3.5–5.0)
Alkaline Phosphatase: 89 U/L (ref 38–126)
Anion gap: 8 (ref 5–15)
BUN: 24 mg/dL — ABNORMAL HIGH (ref 8–23)
CO2: 24 mmol/L (ref 22–32)
Calcium: 8.7 mg/dL — ABNORMAL LOW (ref 8.9–10.3)
Chloride: 104 mmol/L (ref 98–111)
Creatinine, Ser: 0.75 mg/dL (ref 0.61–1.24)
GFR, Estimated: >60 mL/min (ref >60)
Glucose, Bld: 146 mg/dL — ABNORMAL HIGH (ref 70–99)
Potassium: 3.9 mmol/L (ref 3.5–5.1)
Sodium: 136 mmol/L (ref 135–145)
Total Bilirubin: 0.5 mg/dL (ref 0.3–1.2)
Total Protein: 6.5 g/dL (ref 6.5–8.1)

## 2021-06-07 LAB — TROPONIN I (HIGH SENSITIVITY): Troponin I (High Sensitivity): 7 ng/L (ref <18)

## 2021-06-07 LAB — MRSA NEXT GEN BY PCR, NASAL: MRSA by PCR Next Gen: NOT DETECTED

## 2021-06-07 SURGERY — FIXATION, FRACTURE, INTERTROCHANTERIC, WITH INTRAMEDULLARY ROD
Anesthesia: General | Site: Hip | Laterality: Right

## 2021-06-07 MED ORDER — PROPOFOL 10 MG/ML IV BOLUS
INTRAVENOUS | Status: DC | PRN
  Administered 2021-06-07: 20 mg via INTRAVENOUS

## 2021-06-07 MED ORDER — ENSURE ENLIVE PO LIQD
237.0000 mL | Freq: Two times a day (BID) | ORAL | Status: DC
  Administered 2021-06-08 – 2021-06-14 (×7): 237 mL via ORAL
  Filled 2021-06-07: qty 237

## 2021-06-07 MED ORDER — FERROUS SULFATE 325 (65 FE) MG PO TABS
325.0000 mg | ORAL_TABLET | Freq: Every day | ORAL | Status: DC
  Administered 2021-06-09 – 2021-06-15 (×5): 325 mg via ORAL
  Filled 2021-06-07 (×5): qty 1

## 2021-06-07 MED ORDER — SERTRALINE HCL 25 MG PO TABS
25.0000 mg | ORAL_TABLET | Freq: Every day | ORAL | Status: DC
  Administered 2021-06-09 – 2021-06-15 (×5): 25 mg via ORAL
  Filled 2021-06-07 (×8): qty 1

## 2021-06-07 MED ORDER — TRAMADOL HCL 50 MG PO TABS
50.0000 mg | ORAL_TABLET | Freq: Four times a day (QID) | ORAL | Status: DC
  Administered 2021-06-07 – 2021-06-11 (×8): 50 mg via ORAL
  Filled 2021-06-07 (×8): qty 1

## 2021-06-07 MED ORDER — 0.9 % SODIUM CHLORIDE (POUR BTL) OPTIME: TOPICAL | Status: DC | PRN

## 2021-06-07 MED ORDER — HYDROCODONE-ACETAMINOPHEN 5-325 MG PO TABS: 1.0000 | ORAL_TABLET | ORAL | Status: DC | PRN

## 2021-06-07 MED ORDER — ENOXAPARIN SODIUM 40 MG/0.4ML IJ SOSY: 40.0000 mg | PREFILLED_SYRINGE | INTRAMUSCULAR | Status: DC

## 2021-06-07 MED ORDER — EPHEDRINE SULFATE 50 MG/ML IJ SOLN
INTRAMUSCULAR | Status: DC | PRN
  Administered 2021-06-07 (×4): 5 mg via INTRAVENOUS

## 2021-06-07 MED ORDER — QUETIAPINE FUMARATE 25 MG PO TABS
50.0000 mg | ORAL_TABLET | Freq: Every day | ORAL | Status: DC
  Administered 2021-06-07 – 2021-06-14 (×6): 50 mg via ORAL
  Filled 2021-06-07 (×6): qty 2

## 2021-06-07 MED ORDER — BISACODYL 10 MG RE SUPP: 10.0000 mg | Freq: Every day | RECTAL | Status: DC | PRN

## 2021-06-07 MED ORDER — SENNA 8.6 MG PO TABS
1.0000 | ORAL_TABLET | Freq: Two times a day (BID) | ORAL | Status: DC
  Administered 2021-06-07 – 2021-06-15 (×11): 8.6 mg via ORAL
  Filled 2021-06-07 (×12): qty 1

## 2021-06-07 MED ORDER — ONDANSETRON HCL 4 MG PO TABS
4.0000 mg | ORAL_TABLET | Freq: Four times a day (QID) | ORAL | Status: DC | PRN
  Administered 2021-06-07: 4 mg via ORAL
  Filled 2021-06-07: qty 1

## 2021-06-07 MED ORDER — ACETAMINOPHEN 325 MG PO TABS: 325.0000 mg | ORAL_TABLET | Freq: Four times a day (QID) | ORAL | Status: DC | PRN

## 2021-06-07 MED ORDER — GENTAMICIN SULFATE 40 MG/ML IJ SOLN
INTRAMUSCULAR | Status: AC
  Filled 2021-06-07: qty 2

## 2021-06-07 MED ORDER — SODIUM CHLORIDE 0.9 % IV SOLN
75.0000 mL/h | INTRAVENOUS | Status: DC
  Administered 2021-06-07: 75 mL/h via INTRAVENOUS

## 2021-06-07 MED ORDER — POLYETHYLENE GLYCOL 3350 17 G PO PACK
17.0000 g | PACK | Freq: Every day | ORAL | Status: DC | PRN
  Administered 2021-06-08: 17 g via ORAL
  Filled 2021-06-07: qty 1

## 2021-06-07 MED ORDER — DOCUSATE SODIUM 100 MG PO CAPS
100.0000 mg | ORAL_CAPSULE | Freq: Two times a day (BID) | ORAL | Status: DC
  Administered 2021-06-07 – 2021-06-15 (×10): 100 mg via ORAL
  Filled 2021-06-07 (×11): qty 1

## 2021-06-07 MED ORDER — ACETAMINOPHEN 10 MG/ML IV SOLN: 1000.0000 mg | Freq: Once | INTRAVENOUS | Status: DC | PRN

## 2021-06-07 MED ORDER — PHENYLEPHRINE HCL-NACL 20-0.9 MG/250ML-% IV SOLN
INTRAVENOUS | Status: DC | PRN
Start: 2021-06-07 — End: 2021-06-07
  Administered 2021-06-07: 20 ug/min via INTRAVENOUS

## 2021-06-07 MED ORDER — BUPIVACAINE HCL (PF) 0.5 % IJ SOLN
INTRAMUSCULAR | Status: DC | PRN
  Administered 2021-06-07: 2.8 mL

## 2021-06-07 MED ORDER — ACETAMINOPHEN 10 MG/ML IV SOLN
INTRAVENOUS | Status: DC | PRN
Start: 2021-06-07 — End: 2021-06-07
  Administered 2021-06-07: 1000 mg via INTRAVENOUS

## 2021-06-07 MED ORDER — CEFAZOLIN SODIUM-DEXTROSE 2-4 GM/100ML-% IV SOLN
INTRAVENOUS | Status: AC
  Filled 2021-06-07: qty 100

## 2021-06-07 MED ORDER — ONDANSETRON HCL 4 MG/2ML IJ SOLN: 4.0000 mg | Freq: Four times a day (QID) | INTRAMUSCULAR | Status: DC | PRN

## 2021-06-07 MED ORDER — PROPOFOL 500 MG/50ML IV EMUL
INTRAVENOUS | Status: DC | PRN
  Administered 2021-06-07: 20 ug/kg/min via INTRAVENOUS

## 2021-06-07 MED ORDER — ALUM & MAG HYDROXIDE-SIMETH 200-200-20 MG/5ML PO SUSP: 30.0000 mL | ORAL | Status: DC | PRN

## 2021-06-07 MED ORDER — LIDOCAINE HCL (CARDIAC) PF 100 MG/5ML IV SOSY
PREFILLED_SYRINGE | INTRAVENOUS | Status: DC | PRN
  Administered 2021-06-07: 50 mg via INTRAVENOUS

## 2021-06-07 MED ORDER — ADULT MULTIVITAMIN W/MINERALS CH
1.0000 | ORAL_TABLET | Freq: Every day | ORAL | Status: DC
  Administered 2021-06-09 – 2021-06-15 (×5): 1 via ORAL
  Filled 2021-06-07 (×5): qty 1

## 2021-06-07 MED ORDER — SODIUM CHLORIDE 0.9 % IV SOLN
6.2500 mg | Freq: Four times a day (QID) | INTRAVENOUS | Status: DC | PRN
  Administered 2021-06-07: 6.25 mg via INTRAVENOUS
  Filled 2021-06-07: qty 0.25

## 2021-06-07 MED ORDER — FENTANYL CITRATE (PF) 100 MCG/2ML IJ SOLN
INTRAMUSCULAR | Status: DC | PRN
  Administered 2021-06-07: 50 ug via INTRAVENOUS

## 2021-06-07 MED ORDER — ONDANSETRON HCL 4 MG/2ML IJ SOLN: 4.0000 mg | Freq: Once | INTRAMUSCULAR | Status: DC | PRN

## 2021-06-07 MED ORDER — TAMSULOSIN HCL 0.4 MG PO CAPS
0.4000 mg | ORAL_CAPSULE | Freq: Every day | ORAL | Status: DC
Start: 2021-06-07 — End: 2021-06-12
  Administered 2021-06-11: 10:00:00 0.4 mg via ORAL
  Filled 2021-06-07 (×3): qty 1

## 2021-06-07 MED ORDER — HYDROCODONE-ACETAMINOPHEN 7.5-325 MG PO TABS
1.0000 | ORAL_TABLET | ORAL | Status: DC | PRN
  Administered 2021-06-11: 10:00:00 1 via ORAL
  Filled 2021-06-07: qty 1
  Filled 2021-06-07: qty 2

## 2021-06-07 MED ORDER — PHENYLEPHRINE HCL (PRESSORS) 10 MG/ML IV SOLN
INTRAVENOUS | Status: DC | PRN
  Administered 2021-06-07 (×4): 80 ug via INTRAVENOUS
  Administered 2021-06-07: 200 ug via INTRAVENOUS
  Administered 2021-06-07 (×4): 80 ug via INTRAVENOUS
  Administered 2021-06-07: 100 ug via INTRAVENOUS
  Administered 2021-06-07 (×3): 80 ug via INTRAVENOUS

## 2021-06-07 MED ORDER — MORPHINE SULFATE (PF) 2 MG/ML IV SOLN
0.5000 mg | INTRAVENOUS | Status: DC | PRN
  Administered 2021-06-09: 0.5 mg via INTRAVENOUS
  Filled 2021-06-07: qty 1

## 2021-06-07 MED ORDER — ASPIRIN EC 81 MG PO TBEC: 81.0000 mg | DELAYED_RELEASE_TABLET | Freq: Every day | ORAL | Status: DC

## 2021-06-07 MED ORDER — CEFAZOLIN SODIUM-DEXTROSE 2-4 GM/100ML-% IV SOLN
2.0000 g | Freq: Four times a day (QID) | INTRAVENOUS | Status: AC
  Administered 2021-06-07 – 2021-06-08 (×2): 2 g via INTRAVENOUS
  Filled 2021-06-07 (×2): qty 100

## 2021-06-07 MED ORDER — LACTATED RINGERS IV SOLN: INTRAVENOUS | Status: DC

## 2021-06-07 MED ORDER — VITAMIN D 25 MCG (1000 UNIT) PO TABS
1000.0000 [IU] | ORAL_TABLET | Freq: Every day | ORAL | Status: DC
  Administered 2021-06-09 – 2021-06-15 (×5): 1000 [IU] via ORAL
  Filled 2021-06-07 (×5): qty 1

## 2021-06-07 MED ORDER — SODIUM CHLORIDE 0.9 % IR SOLN
Status: DC | PRN
  Administered 2021-06-07: 1002 mL

## 2021-06-07 MED ORDER — LISINOPRIL 10 MG PO TABS: 10.0000 mg | ORAL_TABLET | Freq: Every day | ORAL | Status: DC

## 2021-06-07 MED ORDER — FENTANYL CITRATE (PF) 100 MCG/2ML IJ SOLN: 25.0000 ug | INTRAMUSCULAR | Status: DC | PRN

## 2021-06-07 SURGICAL SUPPLY — 42 items
BIT DRILL 4.3MMS DISTAL GRDTED (BIT) ×1 IMPLANT
BNDG COHESIVE 6X5 TAN ST LF (GAUZE/BANDAGES/DRESSINGS) ×4 IMPLANT
DRAPE 3/4 80X56 (DRAPES) ×4 IMPLANT
DRAPE SURG 17X11 SM STRL (DRAPES) ×4 IMPLANT
DRAPE U-SHAPE 47X51 STRL (DRAPES) ×2 IMPLANT
DRILL 4.3MMS DISTAL GRADUATED (BIT) ×2
DRSG OPSITE POSTOP 3X4 (GAUZE/BANDAGES/DRESSINGS) IMPLANT
DRSG OPSITE POSTOP 4X14 (GAUZE/BANDAGES/DRESSINGS) IMPLANT
DRSG OPSITE POSTOP 4X6 (GAUZE/BANDAGES/DRESSINGS) IMPLANT
DURAPREP 26ML APPLICATOR (WOUND CARE) ×6 IMPLANT
ELECT REM PT RETURN 9FT ADLT (ELECTROSURGICAL) ×2
ELECTRODE REM PT RTRN 9FT ADLT (ELECTROSURGICAL) ×1 IMPLANT
GAUZE 4X4 16PLY ~~LOC~~+RFID DBL (SPONGE) IMPLANT
GLOVE SURG ORTHO LTX SZ9 (GLOVE) ×4 IMPLANT
GLOVE SURG UNDER POLY LF SZ9 (GLOVE) ×2 IMPLANT
GOWN STRL REUS TWL 2XL XL LVL4 (GOWN DISPOSABLE) ×2 IMPLANT
GOWN STRL REUS W/ TWL LRG LVL3 (GOWN DISPOSABLE) ×1 IMPLANT
GOWN STRL REUS W/TWL LRG LVL3 (GOWN DISPOSABLE) ×1
GUIDEPIN VERSANAIL DSP 3.2X444 (ORTHOPEDIC DISPOSABLE SUPPLIES) ×2 IMPLANT
GUIDEWIRE BALL NOSE 80CM (WIRE) ×2 IMPLANT
HEMOVAC 400CC 10FR (MISCELLANEOUS) IMPLANT
KIT TURNOVER CYSTO (KITS) ×2 IMPLANT
MANIFOLD NEPTUNE II (INSTRUMENTS) ×2 IMPLANT
MAT ABSORB  FLUID 56X50 GRAY (MISCELLANEOUS) ×1
MAT ABSORB FLUID 56X50 GRAY (MISCELLANEOUS) ×1 IMPLANT
NAIL HIP FRAC RT 130 11MX400M (Nail) ×2 IMPLANT
NS IRRIG 1000ML POUR BTL (IV SOLUTION) ×2 IMPLANT
PACK HIP COMPR (MISCELLANEOUS) ×2 IMPLANT
PAD ARMBOARD 7.5X6 YLW CONV (MISCELLANEOUS) ×4 IMPLANT
SCREW BONE CORTICAL 5.0X50 (Screw) ×2 IMPLANT
SCREW CORTICAL 5X56 (Screw) ×2 IMPLANT
SCREW LAG 10.5X120MM (Screw) ×2 IMPLANT
SPONGE T-LAP 18X18 ~~LOC~~+RFID (SPONGE) ×4 IMPLANT
STAPLER SKIN PROX 35W (STAPLE) ×2 IMPLANT
SUCTION FRAZIER HANDLE 10FR (MISCELLANEOUS) ×1
SUCTION TUBE FRAZIER 10FR DISP (MISCELLANEOUS) ×1 IMPLANT
SUT VIC AB 0 CT1 36 (SUTURE) ×4 IMPLANT
SUT VIC AB 2-0 CT1 27 (SUTURE) ×1
SUT VIC AB 2-0 CT1 TAPERPNT 27 (SUTURE) ×1 IMPLANT
SUT VICRYL 0 AB UR-6 (SUTURE) ×2 IMPLANT
SYR 30ML LL (SYRINGE) ×2 IMPLANT
WATER STERILE IRR 500ML POUR (IV SOLUTION) IMPLANT

## 2021-06-07 NOTE — Progress Notes (Signed)
PROGRESS NOTE  Todd Trujillo. PJA:[id number] DOB: Aug 23, 1929 DOA: 06/06/2021 PCP: Einar Pheasant, MD   LOS: 1 day   Brief Narrative / Interim history: 87 year old male with history of mild dementia, HTN, HLD, history of prostate cancer status post prostatectomy, comes into the hospital after having a mechanical fall at home, was found to have a right femoral neck fracture.  He tripped and landed on his right side.  No chest pain, syncope, loss of consciousness.  Denies hitting his head.  Orthopedic surgery consulted and he was admitted to the hospitalist service.  Subjective / 24h Interval events: Doing well this morning, complains of hip pain.  He remembers the fall, he was trying to get up.  Denies any chest pain or palpitations.  Daughter is at bedside and tells me that he has had falls in the past and he is somewhat unsteady at baseline  Assessment & Plan: Principal Problem Mildly displaced intratrochanteric fracture of the right femoral neck-orthopedic surgery consulted, appreciate input.  Plan is for operative repair later today -Monitor H&H.,  PT recommendations  Active Problems Essential hypertension-normotensive, resume home medications postoperatively  Leukocytosis-due to hip fracture, likely reactive  Iron deficiency anemia-hemoglobin stable, followed by hematology as an outpatient  History of CVA-resume aspirin.  ? BPH, history of prostate cancer status post prostatectomy-based on most recent outpatient urology notes he does not have a prostate anymore.  Daughters tell me he has been having trouble urinating, will monitor and bladder scan, potentially place Foley if needed  Mild dementia-stable, appears at baseline.  High risk of postop delirium   Scheduled Meds:  tamsulosin  0.4 mg Oral QPC breakfast   Continuous Infusions:   ceFAZolin (ANCEF) IV     lactated ringers 75 mL/hr at 06/07/21 0125   promethazine (PHENERGAN) injection (IM or IVPB) 6.25 mg  (06/07/21 0921)   PRN Meds:.ketorolac, ondansetron **OR** ondansetron (ZOFRAN) IV, promethazine (PHENERGAN) injection (IM or IVPB)  Diet Orders (From admission, onward)     Start     Ordered   06/06/21 2352  Diet Heart Room service appropriate? Yes; Fluid consistency: Thin  Diet effective now       Question Answer Comment  Room service appropriate? Yes   Fluid consistency: Thin      06/06/21 2352            DVT prophylaxis: SCDs Start: 06/06/21 2351     Code Status: Full Code  Family Communication: 2 daughters present at bedside  Status is: Inpatient  Remains inpatient appropriate because: surgery pending  Level of care: Med-Surg  Consultants:  Orthopedic surgery   Procedures:  none  Microbiology  none  Antimicrobials: none    Objective: Vitals:   06/07/21 0104 06/07/21 0106 06/07/21 0525 06/07/21 0758  BP: 133/71  127/65 (!) 113/56  Pulse: 89  85 82  Resp: 19  17 19   Temp: 98.4 F (36.9 C)  98.4 F (36.9 C) 98 F (36.7 C)  TempSrc:    Oral  SpO2: 100%  100% 100%  Weight:  69.7 kg    Height:  5\' 11"  (1.803 m)      Intake/Output Summary (Last 24 hours) at 06/07/2021 1036 Last data filed at 06/07/2021 0700 Gross per 24 hour  Intake 417.9 ml  Output --  Net 417.9 ml   Filed Weights   06/06/21 2048 06/07/21 0106  Weight: 65 kg 69.7 kg    Examination:  Constitutional: NAD Eyes: no scleral icterus ENMT: Mucous membranes are moist.  Neck: normal, supple Respiratory: clear to auscultation bilaterally, no wheezing, no crackles. Normal respiratory effort.  Cardiovascular: Regular rate and rhythm, no murmurs / rubs / gallops. No LE edema.  Abdomen: non distended, no tenderness. Bowel sounds positive.  Musculoskeletal: no clubbing / cyanosis.  Skin: no rashes Neurologic: non focal   Data Reviewed: I have independently reviewed following labs and imaging studies  CBC: Recent Labs  Lab 06/06/21 2148 06/07/21 0122  WBC 13.5* 14.2*   NEUTROABS 11.2*  --   HGB 9.1* 9.0*  HCT 29.2* 27.8*  MCV 88.2 86.3  PLT 333 836   Basic Metabolic Panel: Recent Labs  Lab 06/06/21 2148 06/07/21 0122  NA 136 136  K 4.0 3.9  CL 104 104  CO2 25 24  GLUCOSE 134* 146*  BUN 23 24*  CREATININE 0.79 0.75  CALCIUM 8.8* 8.7*   Liver Function Tests: Recent Labs  Lab 06/06/21 2148 06/07/21 0122  AST 14* 16  ALT 9 8  ALKPHOS 96 89  BILITOT 0.5 0.5  PROT 6.4* 6.5  ALBUMIN 3.4* 3.1*   Coagulation Profile: Recent Labs  Lab 06/06/21 2148  INR 1.5*   HbA1C: No results for input(s): HGBA1C in the last 72 hours. CBG: No results for input(s): GLUCAP in the last 168 hours.  Recent Results (from the past 240 hour(s))  Resp Panel by RT-PCR (Flu A&B, Covid) Nasopharyngeal Swab     Status: None   Collection Time: 06/06/21 10:38 PM   Specimen: Nasopharyngeal Swab; Nasopharyngeal(NP) swabs in vial transport medium  Result Value Ref Range Status   SARS Coronavirus 2 by RT PCR NEGATIVE NEGATIVE Final    Comment: (NOTE) SARS-CoV-2 target nucleic acids are NOT DETECTED.  The SARS-CoV-2 RNA is generally detectable in upper respiratory specimens during the acute phase of infection. The lowest concentration of SARS-CoV-2 viral copies this assay can detect is 138 copies/mL. A negative result does not preclude SARS-Cov-2 infection and should not be used as the sole basis for treatment or other patient management decisions. A negative result may occur with  improper specimen collection/handling, submission of specimen other than nasopharyngeal swab, presence of viral mutation(s) within the areas targeted by this assay, and inadequate number of viral copies(<138 copies/mL). A negative result must be combined with clinical observations, patient history, and epidemiological information. The expected result is Negative.  Fact Sheet for Patients:  EntrepreneurPulse.com.au  Fact Sheet for Healthcare Providers:   IncredibleEmployment.be  This test is no t yet approved or cleared by the [place] FDA and  has been authorized for detection and/or diagnosis of SARS-CoV-2 by FDA under an Emergency Use Authorization (EUA). This EUA will remain  in effect (meaning this test can be used) for the duration of the COVID-19 declaration under Section 564(b)(1) of the Act, 21 U.S.C.section 360bbb-3(b)(1), unless the authorization is terminated  or revoked sooner.       Influenza A by PCR NEGATIVE NEGATIVE Final   Influenza B by PCR NEGATIVE NEGATIVE Final    Comment: (NOTE) The Xpert Xpress SARS-CoV-2/FLU/RSV plus assay is intended as an aid in the diagnosis of influenza from Nasopharyngeal swab specimens and should not be used as a sole basis for treatment. Nasal washings and aspirates are unacceptable for Xpert Xpress SARS-CoV-2/FLU/RSV testing.  Fact Sheet for Patients: EntrepreneurPulse.com.au  Fact Sheet for Healthcare Providers: IncredibleEmployment.be  This test is not yet approved or cleared by the [place] FDA and has been authorized for detection and/or diagnosis of SARS-CoV-2 by FDA under an Emergency  Use Authorization (EUA). This EUA will remain in effect (meaning this test can be used) for the duration of the COVID-19 declaration under Section 564(b)(1) of the Act, 21 U.S.C. section 360bbb-3(b)(1), unless the authorization is terminated or revoked.  Performed at [facility], [address] [place], [place] [address]   MRSA Next Gen by PCR, Nasal     Status: None   Collection Time: 06/07/21  1:39 AM   Specimen: Nasal Mucosa; Nasal Swab  Result Value Ref Range Status   MRSA by PCR Next Gen NOT DETECTED NOT DETECTED Final    Comment: (NOTE) The GeneXpert MRSA Assay (FDA approved for NASAL specimens only), is one component of a comprehensive MRSA colonization surveillance program. It is not intended to  diagnose MRSA infection nor to guide or monitor treatment for MRSA infections. Test performance is not FDA approved in patients less than 72 years old. Performed at [facility], [address] [place], [place] [address]      Radiology Studies: CT HEAD WO CONTRAST (5MM)  Result Date: 06/06/2021 CLINICAL DATA:  Witnessed fall with headaches, initial encounter EXAM: CT HEAD WITHOUT CONTRAST TECHNIQUE: Contiguous axial images were obtained from the base of the skull through the vertex without intravenous contrast. COMPARISON:  09/12/2019 FINDINGS: Brain: No evidence of acute infarction, hemorrhage, hydrocephalus, extra-axial collection or mass lesion/mass effect. Chronic atrophic and ischemic changes are noted stable from the prior exam. Vascular: No hyperdense vessel or unexpected calcification. Skull: Normal. Negative for fracture or focal lesion. Sinuses/Orbits: Orbits are within normal limits. Paranasal sinuses are unremarkable. Mild left mastoid effusion is seen new from the prior exam. Other: None IMPRESSION: Chronic atrophic and ischemic changes stable from the prior study. Mild left mastoid effusion. Electronically Signed   By: Inez Catalina M.D.   On: 06/06/2021 23:29   DG Chest Port 1 View  Result Date: 06/06/2021 CLINICAL DATA:  Fall. EXAM: PORTABLE CHEST 1 VIEW COMPARISON:  Chest radiograph dated 02/27/2021. FINDINGS: The lungs are clear. There is no pleural effusion pneumothorax. The cardiac silhouette is within limits. Atherosclerotic calcification of the aorta. Degenerative changes of the spine. No acute osseous pathology. IMPRESSION: No active disease. Electronically Signed   By: Anner Crete M.D.   On: 06/06/2021 21:01   DG Hip Unilat W or Wo Pelvis 2-3 Views Right  Result Date: 06/06/2021 CLINICAL DATA:  Fall and right hip pain. EXAM: DG HIP (WITH OR WITHOUT PELVIS) 2-3V RIGHT COMPARISON:  None. FINDINGS: Mildly displaced intertrochanteric fracture of the right  femoral neck with mild proximal migration of the femoral shaft in relation to the head of the femur. No dislocation. The bones are osteopenic. Degenerative changes of the lower lumbar spine. Prostate brachytherapy seeds. IMPRESSION: Mildly displaced intertrochanteric fracture of the right femoral neck. Electronically Signed   By: Anner Crete M.D.   On: 06/06/2021 20:58     Marzetta Board, MD, PhD [facility]  Between 7 am - 7 pm I am available, please contact me via Amion (for emergencies) or Securechat (non urgent messages)  Between 7 pm - 7 am I am not available, please contact night coverage MD/APP via Amion

## 2021-06-07 NOTE — Anesthesia Procedure Notes (Signed)
Spinal  Patient location during procedure: OR Start time: 06/07/2021 1:00 PM End time: 06/07/2021 1:08 PM Reason for block: surgical anesthesia Staffing Performed: anesthesiologist  Anesthesiologist: Darrin Nipper, MD Resident/CRNA: Tollie Eth, CRNA Preanesthetic Checklist Completed: patient identified, IV checked, site marked, risks and benefits discussed, surgical consent, monitors and equipment checked, pre-op evaluation and timeout performed Spinal Block Patient position: left lateral decubitus Prep: ChloraPrep Patient monitoring: continuous pulse ox, blood pressure and heart rate Approach: midline Location: L2-3 Injection technique: single-shot Needle Needle type: Sprotte and Whitacre  Needle gauge: 22 G Needle length: 9 cm Assessment Sensory level: T4 Events: CSF return Additional Notes Difficult placement 2/2 os.  Initial attempt by [facility] Shark at L3-4, second by [facility] at L4-5, third by [facility] at L2-3 was successful with Whitacre 22 G.

## 2021-06-07 NOTE — Anesthesia Preprocedure Evaluation (Addendum)
Anesthesia Evaluation  Patient identified by MRN, date of birth, ID band Patient awake    Reviewed: Allergy & Precautions, NPO status , Patient's Chart, lab work & pertinent test results  History of Anesthesia Complications Negative for: history of anesthetic complications  Airway Mallampati: I   Neck ROM: Full    Dental  (+) Upper Dentures Missing most lower teeth:   Pulmonary neg pulmonary ROS,    Pulmonary exam normal breath sounds clear to auscultation       Cardiovascular hypertension, + Peripheral Vascular Disease  Normal cardiovascular exam Rhythm:Regular Rate:Normal  ECG 02/27/21:  Sinus rhythm with 1st degree A-V block Otherwise normal ECG   Neuro/Psych PSYCHIATRIC DISORDERS (mild) Dementia CVA, No Residual Symptoms    GI/Hepatic GERD  ,  Endo/Other  negative endocrine ROS  Renal/GU Renal disease (nephrolithiasis)   Prostate CA    Musculoskeletal   Abdominal   Peds  Hematology  (+) Blood dyscrasia, anemia ,   Anesthesia Other Findings   Reproductive/Obstetrics                            Anesthesia Physical Anesthesia Plan  ASA: 3  Anesthesia Plan: General and Spinal   Post-op Pain Management:    Induction: Intravenous  PONV Risk Score and Plan: 2 and Propofol infusion, TIVA, Treatment may vary due to age or medical condition and Ondansetron  Airway Management Planned: Natural Airway  Additional Equipment:   Intra-op Plan:   Post-operative Plan:   Informed Consent: I have reviewed the patients History and Physical, chart, labs and discussed the procedure including the risks, benefits and alternatives for the proposed anesthesia with the patient or authorized representative who has indicated his/her understanding and acceptance.     Dental Advisory Given  Plan Discussed with: CRNA  Anesthesia Plan Comments: (Patient reports no bleeding problems and no  anticoagulant use.  Plan for spinal with backup GA  Patient consented for risks of anesthesia including but not limited to:  - adverse reactions to medications - damage to eyes, teeth, lips or other oral mucosa - nerve damage due to positioning  - risk of bleeding, infection and or nerve damage from spinal that could lead to paralysis - risk of headache or failed spinal - damage to teeth, lips or other oral mucosa - sore throat or hoarseness - damage to heart, brain, nerves, lungs, other parts of body or loss of life  Explained role of CRNA in peri- and intra-operative care.  Patient voiced understanding.)        Anesthesia Quick Evaluation

## 2021-06-07 NOTE — Transfer of Care (Signed)
Immediate Anesthesia Transfer of Care Note  Patient: Todd Trujillo.  Procedure(s) Performed: INTRAMEDULLARY (IM) NAIL INTERTROCHANTRIC (Right: Hip)  Patient Location: PACU  Anesthesia Type:Spinal  Level of Consciousness: drowsy  Airway & Oxygen Therapy: Patient Spontanous Breathing and Patient connected to face mask oxygen  Post-op Assessment: Report given to RN and Post -op Vital signs reviewed and stable  Post vital signs: Reviewed and stable  Last Vitals:  Vitals Value Taken Time  BP 101/55 06/07/21 1442  Temp    Pulse 94 06/07/21 1444  Resp 20 06/07/21 1444  SpO2 100 % 06/07/21 1444  Vitals shown include unvalidated device data.  Last Pain:  Vitals:   06/07/21 0900  TempSrc:   PainSc: 0-No pain         Complications: No notable events documented.

## 2021-06-07 NOTE — Anesthesia Postprocedure Evaluation (Signed)
Anesthesia Post Note  Patient: Todd Trujillo.  Procedure(s) Performed: INTRAMEDULLARY (IM) NAIL INTERTROCHANTRIC (Right: Hip)  Patient location during evaluation: PACU Anesthesia Type: Spinal Level of consciousness: awake and alert, oriented and patient cooperative Pain management: pain level controlled Vital Signs Assessment: post-procedure vital signs reviewed and stable Respiratory status: spontaneous breathing, nonlabored ventilation and respiratory function stable Cardiovascular status: blood pressure returned to baseline and stable Postop Assessment: adequate PO intake Anesthetic complications: no   No notable events documented.   Last Vitals:  Vitals:   06/07/21 1500 06/07/21 1515  BP: (!) 97/51 (!) 92/51  Pulse: 78 77  Resp: 16 15  Temp:  36.7 C  SpO2: 100% 99%    Last Pain:  Vitals:   06/07/21 1515  TempSrc:   PainSc: 0-No pain    LLE Motor Response: Purposeful movement (06/07/21 1515) LLE Sensation: Decreased (06/07/21 1515) RLE Motor Response: Purposeful movement (06/07/21 1515) RLE Sensation: Decreased (06/07/21 1515)      Darrin Nipper

## 2021-06-07 NOTE — Op Note (Signed)
06/07/2021  2:56 PM  PATIENT:  Todd Trujillo.    PRE-OPERATIVE DIAGNOSIS: Displaced right intertrochanteric hip Fracture  POST-OPERATIVE DIAGNOSIS:  Same  PROCEDURE: Intramedullary fixation of right intertrochanteric hip fracture  SURGEON:  Thornton Park, MD  ANESTHESIA:   Spinal  EBL: 130 cc  IMPLANT:  ZIMMER BIOMET AFFIXUS NAIL 11 mm x 400 mm with a 120 mm lag screw and distal interlocking screws 50 mm  and 56 mm in length.  PREOPERATIVE INDICATIONS:  Todd Trujillo. is a  87 y.o. male with a diagnosis of Right Hip Fracture after a fall.  Given the fracture displacement I recommended intramedullary fixation for this right hip fracture.  The risks, benefits and alternatives were discussed with the patient and his family.  The risks include but are not limited to infection, bleeding requiring blood transfusion, nerve or blood vessel injury, malunion, nonunion, hardware prominence, hardware failure, leg length discrepancy or change in lower extremity rotation and need for further surgery including hardware removal with conversion to a total hip arthroplasty. Medical risks include but are not limited to DVT and pulmonary embolism, myocardial infarction, stroke, pneumonia, respiratory failure and death. The patient and his daughters understood these risks and wished to proceed with surgery.  OPERATIVE PROCEDURE:  The patient was brought to the operating room and placed in the supine position on the fracture table. The patient received spinal anesthesia.  A closed reduction was performed under C-arm guidance.  The fracture reduction was confirmed on both AP and lateral views. After adequate reduction was achieved, a time out was performed to verify the patient's name, date of birth, medical record number, correct site of surgery correct procedure to be performed. The timeout was also used to verify the patient received antibiotics and all appropriate instruments,  implants and radiographic studies were available in the room. Once all in attendance were in agreement, the case began. The patient was prepped and draped in a sterile fashion.  The patient received preoperative antibiotics with 2 g of Ancef IV.  An incision was made proximal to the greater trochanter in line with the femur. A guidewire was placed over the tip of the greater trochanter and advanced by drill into the proximal femur to the level of the lesser trochanter.  Confirmation of the drill pin position was made on AP and lateral C-arm images.  The threaded guidepin was then overdrilled with the proximal femoral entry reamer.  A ball-tipped guidewire was then advanced down the intramedullary canal, across the fracture, and down the femoral shaft to the knee.  The ball tip guidewire's position was confirmed at both the knee and hip via C-arm imaging. A depth gauge was used to measure the length of the long nail to be used. It was measured to be 400 mm. The actual nail was then inserted into the proximal femur, across the fracture site and down the femoral shaft. Its position was confirmed on AP and lateral C-arm images.  The ball tip guidewire was removed.  Once the nail was completely seated, the drill guide for the lag screw was placed through the guide arm for the Affixus nail. A guidepin was then placed through this drill guide and advanced through the lateral cortex of the femur, across the fracture site and into the femoral head achieving a tip apex distance of less than 25 mm. The length of the drill pin was measured to 120 mm, and then the drill for the lag screw was advanced through  the lateral cortex, across the fracture site and up into the femoral head to the depth of the lag screw.  The lag screw was then advanced by hand into position across the fracture site into the femoral head. Its final position was confirmed on AP and lateral C-arm images. Compression was applied as traction was carefully  released. The set screw in the top of the intramedullary rod was tightened by hand using a screwdriver. It was backed off a quarter turn to allow for compression at the fracture site.  The attention was then turned to placement of the distal interlocking screws. A perfect circle technique was used. 2 small stab incisions were made over the distal interlocking screw holes.  A free hand technique was used to drill both distal interlocking screws. The depth of the screw holes was measured with a depth gauge. The 50 mm and 56 mm screws were then advanced into position and tightened by hand. Final C-arm images of the entire intramedullary construct were taken in both the AP and lateral planes.   The wounds were irrigated copiously and closed with 0 Vicryl for closure of the deep fascia and 2-0 Vicryl for subcutaneous closure. The skin was approximated with staples. A dry sterile dressing was applied. I was scrubbed and present the entire case and all sharp, sponge and instrument counts were correct at the conclusion of the case. Patient was transferred to a hospital bed and brought to PACU in stable condition.     Timoteo Gaul, MD

## 2021-06-07 NOTE — TOC Initial Note (Signed)
Transition of Care (TOC) - Initial/Assessment Note    Patient Details  Name: Todd Trujillo. MRN: [id number] Date of Birth: 1929-08-05  Transition of Care [facility]) CM/SW Contact:    Conception Oms, RN Phone Number: 06/07/2021, 2:30 PM  Clinical Narrative:                Patient comes from home, He is noted to have Dementia.  Having surgery today, PT to eval after, Anticipate Need of STR SNF         Patient Goals and CMS Choice        Expected Discharge Plan and Services                                                Prior Living Arrangements/Services                       Activities of Daily Living Home Assistive Devices/Equipment: Scales, Cane (specify quad or straight), Grab bars around toilet ADL Screening (condition at time of admission) Patient's cognitive ability adequate to safely complete daily activities?: Yes Is the patient deaf or have difficulty hearing?: No Does the patient have difficulty seeing, even when wearing glasses/contacts?: No Does the patient have difficulty concentrating, remembering, or making decisions?: Yes Patient able to express need for assistance with ADLs?: Yes Does the patient have difficulty dressing or bathing?: Yes Independently performs ADLs?: No Communication: Independent Dressing (OT): Needs assistance Is this a change from baseline?: Pre-admission baseline Grooming: Needs assistance Feeding: Independent Bathing: Needs assistance Is this a change from baseline?: Pre-admission baseline Toileting: Needs assistance Is this a change from baseline?: Pre-admission baseline In/Out Bed: Needs assistance Is this a change from baseline?: Pre-admission baseline Walks in Home: Independent with device (comment) Does the patient have difficulty walking or climbing stairs?: Yes Weakness of Legs: Right Weakness of Arms/Hands: None  Permission Sought/Granted                  Emotional Assessment               Admission diagnosis:  Fall [W19.XXXA] Right femoral fracture ([place]) [S72.91XA] Fall, initial encounter [W19.XXXA] Closed displaced intertrochanteric fracture of right femur, initial encounter [facility]) [S72.141A] Patient Active Problem List   Diagnosis Date Noted   Right femoral fracture ([place]) 06/06/2021   Leucocytosis 06/06/2021   Obsessive behavior 03/11/2021   History of CVA (cerebrovascular accident) 09/21/2020   Renal cyst 09/21/2020   Weakness 09/21/2020   Cerebral atrophy ([place]) 09/15/2020   Cerebrovascular accident (CVA) ([place])    Left facial numbness 09/12/2019   HTN (hypertension) 09/12/2019   GERD (gastroesophageal reflux disease) 09/12/2019   Arthritis 08/13/2018   Benign prostatic hyperplasia 08/13/2018   Diverticulosis 08/13/2018   History of anemia 08/13/2018   History of colonic polyps 08/13/2018   History of nephrolithiasis 08/13/2018   History of pneumonia 08/13/2018   Weight loss 12/15/2016   Healthcare maintenance 12/14/2016   Skin lesion of chest wall 09/23/2016   Anemia 09/17/2016   Hypercholesterolemia 09/17/2016   Incomplete emptying of bladder 02/02/2016   TIA (transient ischemic attack) 01/08/2016   Macular degeneration 01/08/2016   History of prostate cancer 01/08/2016   PVD (peripheral vascular disease) ([place]) 10/12/2015   SCC (squamous cell carcinoma) 01/24/2015   Bladder outlet obstruction 04/15/2013   Acquired cyst of kidney 04/16/2012  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

## 2021-06-07 NOTE — Telephone Encounter (Signed)
Pt is in the hospital. Set up appt after discharged.

## 2021-06-07 NOTE — Progress Notes (Signed)
Initial Nutrition Assessment  DOCUMENTATION CODES:   Not applicable  INTERVENTION:   -Once diet is advanced, add:   -Ensure Enlive po BID, each supplement provides 350 kcal and 20 grams of protein  -MVI with minerals daily  NUTRITION DIAGNOSIS:   Increased nutrient needs related to post-op healing as evidenced by estimated needs  GOAL:   Patient will meet greater than or equal to 90% of their needs  MONITOR:   PO intake, Supplement acceptance, Diet advancement, Labs, Weight trends, Skin, I & O's  REASON FOR ASSESSMENT:   Malnutrition Screening Tool    ASSESSMENT:   Todd Trujillo. is a 87 y.o. male with medical history significant of dementia, hyperlipidemia, hypertension, macular degeneration of the eye, BPH, squamous cell carcinoma of the skin who had a mechanical fall at home and came in with right femoral neck fracture.  Patient apparently tripped and fell landing on the right side.  Denied hitting his head.  Denied any dizziness prior to the event denied any other significant injury.  Patient was seen and evaluated in the ER.  Work-up showed right intertrochanteric femur fracture.  No other fractures or injury.  Patient appears hemodynamically stable except for the pain.  Orthopedic consulted and patient being admitted for operative repair.  Patient denied any other complaints at the moment.  Pt admitted with rt intertochanteric hip fracture.   Reviewed I/O's: +418 ml x 24 hours  Per orthopedics notes, plan for surgery today. Pt currently NPO for procedure.    Pt unavailable at time of visit; in OR. RD unable to obtain further nutrition-related history or complete nutrition-focused physical exam at this time.    Reviewed wt hx; wt has been stable over the past 3 months.   Pt with increased nutritional needs for post-operative healing and would benefit from addition of oral nutrition supplements.   Medications reviewed and include lactated ringers  infusion @ 75 ml/hr.   Labs reviewed.   Diet Order:   Diet Order             Diet Heart Room service appropriate? Yes; Fluid consistency: Thin  Diet effective now                   EDUCATION NEEDS:   No education needs have been identified at this time  Skin:  Skin Assessment: Reviewed RN Assessment  Last BM:  06/05/21  Height:   Ht Readings from Last 1 Encounters:  06/07/21 5\' 11"  (1.803 m)    Weight:   Wt Readings from Last 1 Encounters:  06/07/21 69.7 kg    Ideal Body Weight:  78.2 kg  BMI:  Body mass index is 21.42 kg/m.  Estimated Nutritional Needs:   Kcal:  1900-2100  Protein:  90-105 grams  Fluid:  > 1.9 L    Loistine Chance, RD, LDN, [place] Registered Dietitian II Certified Diabetes Care and Education Specialist Please refer to [facility] for RD and/or RD on-call/weekend/after hours pager

## 2021-06-07 NOTE — Consult Note (Signed)
ORTHOPAEDIC CONSULTATION  REQUESTING PHYSICIAN: Caren Griffins, MD  Chief Complaint: Right hip pain  HPI: Todd Trujillo. is a 87 y.o. male who complains of right hip pain status post fall.  In the ER patient had x-rays demonstrating a displaced right intertrochanteric hip fracture.  Patient's daughter is at the bedside.  Patient has had episode of emesis this morning.  Past Medical History:  Diagnosis Date   Arthritis    Cancer [facility])    prostate   Hyperlipidemia    Hypertension    Left wrist fracture    Macular degeneration of right eye    Nephrolithiasis    Prostate CA ([place]) 2003   Prostate troubles    Patient was unsure of term   Squamous cell carcinoma of skin 01/23/2017   R distal lat bicep near elbow   Past Surgical History:  Procedure Laterality Date   TOTAL KNEE ARTHROPLASTY     Social History   Socioeconomic History   Marital status: Married    Spouse name: Not on file   Number of children: Not on file   Years of education: Not on file   Highest education level: Not on file  Occupational History   Not on file  Tobacco Use   Smoking status: Never   Smokeless tobacco: Never  Substance and Sexual Activity   Alcohol use: No   Drug use: Not Currently   Sexual activity: Not on file  Other Topics Concern   Not on file  Social History Narrative   Not on file   Social Determinants of Health   Financial Resource Strain: Not on file  Food Insecurity: Not on file  Transportation Needs: Not on file  Physical Activity: Not on file  Stress: Not on file  Social Connections: Not on file   Family History  Problem Relation Age of Onset   Colon cancer Mother    Allergies  Allergen Reactions   Codeine Nausea And Vomiting and Nausea Only    Other reaction(s): Vomiting   Prior to Admission medications   Medication Sig Start Date End Date Taking? Authorizing Provider  aspirin EC 81 MG tablet Take 1 tablet (81 mg total) by mouth daily. Swallow  whole. 09/21/20   Einar Pheasant, MD  lisinopril (ZESTRIL) 20 MG tablet Take 0.5 tablets (10 mg total) by mouth daily. 12/16/20   Einar Pheasant, MD  mupirocin ointment (BACTROBAN) 2 % Apply 1 application topically 2 (two) times daily. 01/11/21   Ralene Bathe, MD  QUEtiapine (SEROQUEL) 50 MG tablet Take 1 tablet (50 mg total) by mouth at bedtime. 04/25/21   Einar Pheasant, MD  sertraline (ZOLOFT) 25 MG tablet TAKE 1 TABLET BY MOUTH DAILY. 06/05/21   Einar Pheasant, MD   CT HEAD WO CONTRAST (5MM)  Result Date: 06/06/2021 CLINICAL DATA:  Witnessed fall with headaches, initial encounter EXAM: CT HEAD WITHOUT CONTRAST TECHNIQUE: Contiguous axial images were obtained from the base of the skull through the vertex without intravenous contrast. COMPARISON:  09/12/2019 FINDINGS: Brain: No evidence of acute infarction, hemorrhage, hydrocephalus, extra-axial collection or mass lesion/mass effect. Chronic atrophic and ischemic changes are noted stable from the prior exam. Vascular: No hyperdense vessel or unexpected calcification. Skull: Normal. Negative for fracture or focal lesion. Sinuses/Orbits: Orbits are within normal limits. Paranasal sinuses are unremarkable. Mild left mastoid effusion is seen new from the prior exam. Other: None IMPRESSION: Chronic atrophic and ischemic changes stable from the prior study. Mild left mastoid effusion. Electronically Signed  By: Inez Catalina M.D.   On: 06/06/2021 23:29   DG Chest Port 1 View  Result Date: 06/06/2021 CLINICAL DATA:  Fall. EXAM: PORTABLE CHEST 1 VIEW COMPARISON:  Chest radiograph dated 02/27/2021. FINDINGS: The lungs are clear. There is no pleural effusion pneumothorax. The cardiac silhouette is within limits. Atherosclerotic calcification of the aorta. Degenerative changes of the spine. No acute osseous pathology. IMPRESSION: No active disease. Electronically Signed   By: Anner Crete M.D.   On: 06/06/2021 21:01   DG Hip Unilat W or Wo Pelvis 2-3  Views Right  Result Date: 06/06/2021 CLINICAL DATA:  Fall and right hip pain. EXAM: DG HIP (WITH OR WITHOUT PELVIS) 2-3V RIGHT COMPARISON:  None. FINDINGS: Mildly displaced intertrochanteric fracture of the right femoral neck with mild proximal migration of the femoral shaft in relation to the head of the femur. No dislocation. The bones are osteopenic. Degenerative changes of the lower lumbar spine. Prostate brachytherapy seeds. IMPRESSION: Mildly displaced intertrochanteric fracture of the right femoral neck. Electronically Signed   By: Anner Crete M.D.   On: 06/06/2021 20:58    Positive ROS: All other systems have been reviewed and were otherwise negative with the exception of those mentioned in the HPI and as above.  Physical Exam: General: Alert, no acute distress  MUSCULOSKELETAL: Right hip: Patient skin is intact.  There is no erythema ecchymosis swelling.  His thigh and leg compartments are soft and compressible.  Patient has palpable pedal pulses, intact sensation light touch and intact motor function distally.  He is shortened and externally rotated.  Assessment: Right intertrochanteric hip fracture  Plan: I explained to the patient, his daughter and his other daughter by phone nature of his fracture.  I recommended intramedullary fixation for this fracture.  I asked planed the details of the operation as well as the postoperative course to them.  Answered all their questions.  I discussed the risks and benefits of surgery. They understand risks include but are not limited to infection, bleeding requiring blood transfusion, nerve or blood vessel injury, joint stiffness or loss of motion, persistent pain, weakness or instability, malunion, nonunion and hardware failure and the need for further surgery. Medical risks include but are not limited to DVT and pulmonary embolism, myocardial infarction, stroke, pneumonia, respiratory failure and death. Patient's daughters understood these  risks and wished to proceed.    Thornton Park, MD    06/07/2021 9:16 AM

## 2021-06-07 NOTE — ED Notes (Signed)
Patient transported to floor with family member and all personal belongings

## 2021-06-07 NOTE — Telephone Encounter (Signed)
FYI he is new to [place] so can we move him out [place]

## 2021-06-07 NOTE — Telephone Encounter (Signed)
Thanks Melissa 

## 2021-06-07 NOTE — Plan of Care (Signed)
  Problem: Education: Goal: Knowledge of General Education information will improve Description: Including pain rating scale, medication(s)/side effects and non-pharmacologic comfort measures Outcome: Progressing Note: Admission profile completed with the assistance of the daughter. Patient has dementia.  Pain is 10/10. skin intact.

## 2021-06-07 NOTE — ED Notes (Signed)
Patient repositioned in bed and Primofit placed for comfort.  Placed in gown and provided with warm blankets.

## 2021-06-08 DIAGNOSIS — D72829 Elevated white blood cell count, unspecified: Secondary | ICD-10-CM | POA: Diagnosis not present

## 2021-06-08 DIAGNOSIS — S72141A Displaced intertrochanteric fracture of right femur, initial encounter for closed fracture: Secondary | ICD-10-CM | POA: Diagnosis not present

## 2021-06-08 DIAGNOSIS — I10 Essential (primary) hypertension: Secondary | ICD-10-CM | POA: Diagnosis not present

## 2021-06-08 DIAGNOSIS — W19XXXA Unspecified fall, initial encounter: Secondary | ICD-10-CM | POA: Diagnosis not present

## 2021-06-08 LAB — COMPREHENSIVE METABOLIC PANEL
ALT: 8 U/L (ref 0–44)
AST: 13 U/L — ABNORMAL LOW (ref 15–41)
Albumin: 2.4 g/dL — ABNORMAL LOW (ref 3.5–5.0)
Alkaline Phosphatase: 72 U/L (ref 38–126)
Anion gap: 4 — ABNORMAL LOW (ref 5–15)
BUN: 27 mg/dL — ABNORMAL HIGH (ref 8–23)
CO2: 25 mmol/L (ref 22–32)
Calcium: 7.9 mg/dL — ABNORMAL LOW (ref 8.9–10.3)
Chloride: 107 mmol/L (ref 98–111)
Creatinine, Ser: 1.08 mg/dL (ref 0.61–1.24)
GFR, Estimated: >60 mL/min (ref >60)
Glucose, Bld: 136 mg/dL — ABNORMAL HIGH (ref 70–99)
Potassium: 4.1 mmol/L (ref 3.5–5.1)
Sodium: 136 mmol/L (ref 135–145)
Total Bilirubin: 0.5 mg/dL (ref 0.3–1.2)
Total Protein: 4.8 g/dL — ABNORMAL LOW (ref 6.5–8.1)

## 2021-06-08 LAB — HEMOGLOBIN AND HEMATOCRIT, BLOOD
HCT: 23.1 % — ABNORMAL LOW (ref 39.0–52.0)
HCT: 25.2 % — ABNORMAL LOW (ref 39.0–52.0)
Hemoglobin: 7.3 g/dL — ABNORMAL LOW (ref 13.0–17.0)
Hemoglobin: 8.1 g/dL — ABNORMAL LOW (ref 13.0–17.0)

## 2021-06-08 LAB — CBC
HCT: 19.1 % — ABNORMAL LOW (ref 39.0–52.0)
Hemoglobin: 6.1 g/dL — ABNORMAL LOW (ref 13.0–17.0)
MCH: 28 pg (ref 26.0–34.0)
MCHC: 31.9 g/dL (ref 30.0–36.0)
MCV: 87.6 fL (ref 80.0–100.0)
Platelets: 206 10*3/uL (ref 150–400)
RBC: 2.18 MIL/uL — ABNORMAL LOW (ref 4.22–5.81)
RDW: 15.2 % (ref 11.5–15.5)
WBC: 11.5 10*3/uL — ABNORMAL HIGH (ref 4.0–10.5)
nRBC: 0 % (ref 0.0–0.2)

## 2021-06-08 LAB — PREPARE RBC (CROSSMATCH)

## 2021-06-08 MED ORDER — METHOCARBAMOL 500 MG PO TABS
500.0000 mg | ORAL_TABLET | Freq: Four times a day (QID) | ORAL | Status: DC | PRN
  Administered 2021-06-08 – 2021-06-14 (×3): 500 mg via ORAL
  Filled 2021-06-08 (×3): qty 1

## 2021-06-08 MED ORDER — METHOCARBAMOL 1000 MG/10ML IJ SOLN
500.0000 mg | Freq: Four times a day (QID) | INTRAVENOUS | Status: DC | PRN
  Administered 2021-06-11 – 2021-06-13 (×3): 500 mg via INTRAVENOUS
  Filled 2021-06-08 (×4): qty 5

## 2021-06-08 MED ORDER — SODIUM CHLORIDE 0.9% IV SOLUTION: Freq: Once | INTRAVENOUS | Status: AC

## 2021-06-08 NOTE — Progress Notes (Signed)
Subjective:  POD #1 s/p intramedullary fixation for right intertrochanteric hip fracture.   Patient reports right hip pain as mild to moderate.  Patient appears to be having some spasms within the right lower extremity movement.  His family is at the bedside.  Patient has a hemoglobin of 6.1 today.  Objective:   VITALS:   Vitals:   06/08/21 0436 06/08/21 0848 06/08/21 0935 06/08/21 1154  BP: (!) 91/45 (!) 90/49 (!) 93/47 (!) 104/54  Pulse: 78 80 82 80  Resp: 17 15 16 16   Temp: 97.9 F (36.6 C) 98.7 F (37.1 C) 98.1 F (36.7 C) 98.1 F (36.7 C)  TempSrc:   Oral Oral  SpO2: 97% 97% 98% 98%  Weight:      Height:        PHYSICAL EXAM: Right lower extremity Neurovascular intact Sensation intact distally Intact pulses distally Dorsiflexion/Plantar flexion intact Incision: dressing C/D/I No cellulitis present Compartment soft  LABS  Results for orders placed or performed during the hospital encounter of 06/06/21 (from the past 24 hour(s))  Comprehensive metabolic panel     Status: Abnormal   Collection Time: 06/08/21  4:00 AM  Result Value Ref Range   Sodium 136 135 - 145 mmol/L   Potassium 4.1 3.5 - 5.1 mmol/L   Chloride 107 98 - 111 mmol/L   CO2 25 22 - 32 mmol/L   Glucose, Bld 136 (H) 70 - 99 mg/dL   BUN 27 (H) 8 - 23 mg/dL   Creatinine, Ser 1.08 0.61 - 1.24 mg/dL   Calcium 7.9 (L) 8.9 - 10.3 mg/dL   Total Protein 4.8 (L) 6.5 - 8.1 g/dL   Albumin 2.4 (L) 3.5 - 5.0 g/dL   AST 13 (L) 15 - 41 U/L   ALT 8 0 - 44 U/L   Alkaline Phosphatase 72 38 - 126 U/L   Total Bilirubin 0.5 0.3 - 1.2 mg/dL   GFR, Estimated >60 >60 mL/min   Anion gap 4 (L) 5 - 15  CBC     Status: Abnormal   Collection Time: 06/08/21  4:00 AM  Result Value Ref Range   WBC 11.5 (H) 4.0 - 10.5 K/uL   RBC 2.18 (L) 4.22 - 5.81 MIL/uL   Hemoglobin 6.1 (L) 13.0 - 17.0 g/dL   HCT 19.1 (L) 39.0 - 52.0 %   MCV 87.6 80.0 - 100.0 fL   MCH 28.0 26.0 - 34.0 pg   MCHC 31.9 30.0 - 36.0 g/dL   RDW 15.2 11.5  - 15.5 %   Platelets 206 150 - 400 K/uL   nRBC 0.0 0.0 - 0.2 %  Prepare RBC (crossmatch)     Status: None   Collection Time: 06/08/21  7:26 AM  Result Value Ref Range   Order Confirmation      ORDER PROCESSED BY BLOOD BANK Performed at [name], [address] [place], [place] [address]     CT HEAD WO CONTRAST (5MM)  Result Date: 06/06/2021 CLINICAL DATA:  Witnessed fall with headaches, initial encounter EXAM: CT HEAD WITHOUT CONTRAST TECHNIQUE: Contiguous axial images were obtained from the base of the skull through the vertex without intravenous contrast. COMPARISON:  09/12/2019 FINDINGS: Brain: No evidence of acute infarction, hemorrhage, hydrocephalus, extra-axial collection or mass lesion/mass effect. Chronic atrophic and ischemic changes are noted stable from the prior exam. Vascular: No hyperdense vessel or unexpected calcification. Skull: Normal. Negative for fracture or focal lesion. Sinuses/Orbits: Orbits are within normal limits. Paranasal sinuses are unremarkable. Mild left  mastoid effusion is seen new from the prior exam. Other: None IMPRESSION: Chronic atrophic and ischemic changes stable from the prior study. Mild left mastoid effusion. Electronically Signed   By: Inez Catalina M.D.   On: 06/06/2021 23:29   DG Chest Port 1 View  Result Date: 06/06/2021 CLINICAL DATA:  Fall. EXAM: PORTABLE CHEST 1 VIEW COMPARISON:  Chest radiograph dated 02/27/2021. FINDINGS: The lungs are clear. There is no pleural effusion pneumothorax. The cardiac silhouette is within limits. Atherosclerotic calcification of the aorta. Degenerative changes of the spine. No acute osseous pathology. IMPRESSION: No active disease. Electronically Signed   By: Anner Crete M.D.   On: 06/06/2021 21:01   DG C-Arm 1-60 Min-No Report  Result Date: 06/07/2021 Fluoroscopy was utilized by the requesting physician.  No radiographic interpretation.   DG HIP UNILAT WITH PELVIS 2-3 VIEWS RIGHT  Result  Date: 06/07/2021 CLINICAL DATA:  Intramedullary rod fixation of intertrochanteric femur fracture. EXAM: DG HIP (WITH OR WITHOUT PELVIS) 2-3V RIGHT; DG C-ARM 1-60 MIN-NO REPORT COMPARISON:  Right hip radiographs-06/06/2021 FLUOROSCOPY TIME:  1 minute, 45 seconds FINDINGS: Five spot intraoperative fluoroscopic images of the right hip and distal aspect the right femur are provided for review and demonstrate the sequela of intramedullary rod fixation of the right femur and dynamic screw fixation of the right femoral neck with improved alignment of known comminuted intratrochanteric femur fracture. The distal end of the femoral intramedullary rod is transfixed with 2 cancellous screws. Post right total knee replacement, incompletely evaluated. Expected subcutaneous emphysema about the operative site. No radiopaque foreign body. IMPRESSION: Post intramedullary rod fixation of right femur and dynamic screw fixation femoral neck without evidence of hardware failure or loosening. Electronically Signed   By: Sandi Mariscal M.D.   On: 06/07/2021 15:37   DG Hip Unilat W or Wo Pelvis 2-3 Views Right  Result Date: 06/06/2021 CLINICAL DATA:  Fall and right hip pain. EXAM: DG HIP (WITH OR WITHOUT PELVIS) 2-3V RIGHT COMPARISON:  None. FINDINGS: Mildly displaced intertrochanteric fracture of the right femoral neck with mild proximal migration of the femoral shaft in relation to the head of the femur. No dislocation. The bones are osteopenic. Degenerative changes of the lower lumbar spine. Prostate brachytherapy seeds. IMPRESSION: Mildly displaced intertrochanteric fracture of the right femoral neck. Electronically Signed   By: Anner Crete M.D.   On: 06/06/2021 20:58   DG FEMUR, MIN 2 VIEWS RIGHT  Result Date: 06/07/2021 CLINICAL DATA:  Intramedullary rod fixation of intertrochanteric femur fracture. EXAM: RIGHT FEMUR 2 VIEWS COMPARISON:  Right hip radiographs-earlier same day FINDINGS: Post intramedullary rod fixation of  comminuted intertrochanteric femur fracture and dynamic screw fixation of the right femoral neck. Alignment appears near anatomic. The distal end of femoral rod is transfixed with 2 cancellous screws. There is a minimal amount of expected subcutaneous emphysema about the operative site. No radiopaque foreign body. Post right total knee replacement, incompletely evaluated. Scattered adjacent vascular calcifications. Brachytherapy seeds overlies expected location of the prostate gland. IMPRESSION: Post intramedullary rod and dynamic screw fixation of intertrochanteric femur fracture without evidence of complication. Electronically Signed   By: Sandi Mariscal M.D.   On: 06/07/2021 15:39    Assessment/Plan: 1 Day Post-Op   Principal Problem:   Right femoral fracture (HCC) Active Problems:   Hypercholesterolemia   Benign prostatic hyperplasia   HTN (hypertension)   GERD (gastroesophageal reflux disease)   Cerebrovascular accident (CVA) ([place])   Leucocytosis  Patient ordered for transfusion of PRBCs for hemoglobin of  6.1.  His systolic blood pressures in the 90s.  Patient is having symptomatic anemia.  Blood loss during surgery was minimal and part of his anemia may be dilutional.  Hemoglobin will continue to be monitored.  Patient having spasms and is ordered for Robaxin.  His thigh is soft and not swollen.  There is no active signs of bleeding.  His dressings are completely clean dry and intact.  Continue with physical therapy as patient can tolerate.  Will require a skilled nursing facility upon discharge.    Thornton Park , MD 06/08/2021, 12:56 PM

## 2021-06-08 NOTE — Evaluation (Signed)
Physical Therapy Evaluation Patient Details Name: Todd Trujillo. MRN: [id number] DOB: 05-29-1930 Today's Date: 06/08/2021  History of Present Illness  Pt is a 87 y/o male with medical hx significant for dementia, hyperlipidemia, HTN, macular degeneration of the eyes, BPH, who had a mechanical fall in his home that resulted in a R femoral neck fracture. Pt is now s/p R intramedullary fixation.  Pt family present (spouse) who provides all information regarding PLOF, home set-up on pt's behalf.  Clinical Impression  Pt presents with the above diagnosis and is now 1 day s/p intramedullary fixation for right intertrochanteric hip fracture. Upon entry, pt spouse present and pt asleep supine in hospital bed. Pt does initially awake to VC/TC, however, is lethargic and intermittently falls asleep throughout entire exam. Pt has dementia and has difficulty following and responding to cues throughout, with speech that is difficult to understand. However, pt was able to indicate intact BLE sensation and even verbalize which LE was being tested. Pt exhibits pain response when PT palpates RLE but does not respond with an answer when asked about pain. Pt was able to perform ankle pumps following multi-modal cuing, including TC. PT did attempt to assist pt with bed mobility and repositioning, however pt limited secondary to weakness, lethargy and difficulty with cuing. He currently requires support of hospital bed for more upright posture/modified sitting in the bed. Per spouse pt PLOF is limited household ambulator with use of SPC for AD. While pt lives with supportive family, due to the aforementioned impairments and decrease in functional mobility, PT recommends SNF at this time. The pt will benefit from further skilled PT to increase pt's strength, mobility, and gait ability in order to return pt to PLOF.   Recommendations for follow up therapy are one component of a multi-disciplinary discharge planning  process, led by the attending physician.  Recommendations may be updated based on patient status, additional functional criteria and insurance authorization.  Follow Up Recommendations Skilled nursing-short term rehab (<3 hours/day)    Assistance Recommended at Discharge Intermittent Supervision/Assistance  Functional Status Assessment Patient has had a recent decline in their functional status and demonstrates the ability to make significant improvements in function in a reasonable and predictable amount of time.  Equipment Recommendations  Rolling walker (2 wheels)    Recommendations for Other Services       Precautions / Restrictions Precautions Precautions: Fall;Other (comment) (hip fx s/p intramedulalry fixation) Precaution Comments: s/p intramedullary fixation for right intertrochanteric hip fracture Restrictions Weight Bearing Restrictions: Yes RLE Weight Bearing: Weight bearing as tolerated      Mobility  Bed Mobility Overal bed mobility: Needs Assistance             General bed mobility comments: Pt currently requires assistance with all bed mobility. PT attempts to assist pt with bed mobility to improve positioning in bed, pt quickly displays pain reaction, pt unable to follow cues for participating in repositioning. Pt lethargic and falling asleep throughout exam.    Transfers Overall transfer level: Needs assistance                 General transfer comment: at baseline pt requires assistance with positioning in hospital bed. Pt currently WBAT but unable to perform basic bed mobility this session in part due to increased lethargy/difficulty following cues    Ambulation/Gait                  Stairs  Wheelchair Mobility    Modified Rankin (Stroke Patients Only)       Balance Overall balance assessment: Needs assistance   Sitting balance-Leahy Scale: Poor Sitting balance - Comments: PT cues pt to sit upright in bed. Pt with  difficutly following cues, lethargic, but also limited likely due to weakness and is unable to perform. PT utilizes hospital bed to promote improved supported, upright positioning.                                     Pertinent Vitals/Pain Pain Assessment: Faces Faces Pain Scale: Hurts little more Pain Location: RLE Pain Intervention(s): Limited activity within patient's tolerance;Monitored during session    [place] expects to be discharged to:: Private residence Living Arrangements: Spouse/significant other;Children (lives with adult daughter and adult granddaughter who is special needs. Spouse and adult daughter are able to assist) Available Help at Discharge: Family;Available 24 hours/day Type of Home: House Home Access: Stairs to enter Entrance Stairs-Rails: Psychiatric nurse of Steps: 3 Alternate Level Stairs-Number of Steps: Pt spouse reports pt does not go to second floor, is able to live on first level Home Layout: Two level;Able to live on main level with bedroom/bathroom Home Equipment: [place] - single point;Rolling Walker (2 wheels) Additional Comments: Pt spouse reports no grab bars    Prior Function Prior Level of Function : Needs assist             Mobility Comments: Pt limited household ambulator per spouse, used [facility] ADLs Comments: Pt with dementia, requires assist with ADLs     Hand Dominance        Extremity/Trunk Assessment                Communication   Communication: HOH;Other (comment) (Pt very lethargic throughout, intermittently falling back asleep, pt with difficulty following cues, does respond but difficulty with clear speech)  Cognition Arousal/Alertness: Lethargic (pt falling asleep througout exam, wakes to cuing to only fall back asleep a few minutes later) Behavior During Therapy: WFL for tasks assessed/performed Overall Cognitive Status: History of cognitive impairments - at baseline                                  General Comments: Pt has dementia        General Comments      Exercises General Exercises - Lower Extremity Ankle Circles/Pumps: AROM;AAROM;10 reps;Both Other Exercises Other Exercises: PT provides multi-modal cues to assist pt through ankle pumps intervention B. Attempts bed mobility but pt currently unable (lethargic, pt difficulty following cues, general weakness). Pt family member present and PT instructs pt's family member in WBAT precautions, importance of PT, and helping pt perform ankle pumps throughout day. Instructs family member to wait/ask for assistance from rehab or nursing if attempting to help pt stand up and not to try without rehab or nursing assistance.   Assessment/Plan    PT Assessment Patient needs continued PT services  PT Problem List Decreased strength;Decreased mobility;Decreased range of motion;Decreased activity tolerance;Decreased cognition;Pain       PT Treatment Interventions DME instruction;Gait training;Stair training;Functional mobility training;Therapeutic activities;Therapeutic exercise;Balance training;Neuromuscular re-education;Cognitive remediation;Patient/family education;Wheelchair mobility training    PT Goals (Current goals can be found in the Care Plan section)  Acute Rehab PT Goals Patient Stated Goal: improve pt strength to go home PT Goal Formulation: Patient  unable to participate in goal setting Time For Goal Achievement: 06/22/21 Potential to Achieve Goals: Fair    Frequency 7X/week   Barriers to discharge Inaccessible home environment;Other (comment) (pt with supportive family, however, pt will require a higher level of care) While pt does live with supportive family, pt very lethargic/weak with RLE pain upon assessment. Pt mobility is not at PLOF per pt's family member, pt cognition, however, is impaired at baseline (dementia). Pt will need to be able to ascend/descend 3 steps wtih B  handrails to enter his home. Pt also lives with adult grandchild who also has a disability and requires assistance.    Co-evaluation               AM-PAC PT "6 Clicks" Mobility  Outcome Measure Help needed turning from your back to your side while in a flat bed without using bedrails?: A Lot Help needed moving from lying on your back to sitting on the side of a flat bed without using bedrails?: A Lot Help needed moving to and from a bed to a chair (including a wheelchair)?: A Lot Help needed standing up from a chair using your arms (e.g., wheelchair or bedside chair)?: A Lot Help needed to walk in hospital room?: A Lot Help needed climbing 3-5 steps with a railing? : A Lot 6 Click Score: 12    End of Session   Activity Tolerance: Patient limited by lethargy;Other (comment);Patient limited by fatigue (limited due to cognition as well) Patient left: in bed;with nursing/sitter in room;with family/visitor present;with call bell/phone within reach Nurse Communication: Mobility status PT Visit Diagnosis: Muscle weakness (generalized) (M62.81);History of falling (Z91.81);Other abnormalities of gait and mobility (R26.89)    Time: 1941-7408 PT Time Calculation (min) (ACUTE ONLY): 22 min   Charges:   PT Evaluation $PT Eval High Complexity: 1 High PT Treatments $Therapeutic Exercise: 8-22 mins        Ricard Dillon PT, DPT   Zollie Pee 06/08/2021, 3:03 PM

## 2021-06-08 NOTE — NC FL2 (Signed)
[place] LEVEL OF CARE SCREENING TOOL     IDENTIFICATION  Patient Name: Todd Trujillo. Birthdate: 05-07-1930 Sex: male Admission Date (Current Location): 06/06/2021  [facility] and [place] Number:  Engineering geologist and Address:  [facility], [address] [place], [place] [address]      Provider Number: [id number]  Attending Physician Name and Address:  Caren Griffins, MD  Relative Name and Phone Number:  Marliss Czar Daughter [phone]    Current Level of Care: Hospital Recommended Level of Care: [place] Prior Approval Number:    Date Approved/Denied:   PASRR Number: [id number]  Discharge Plan: SNF    Current Diagnoses: Patient Active Problem List   Diagnosis Date Noted   Right femoral fracture ([place]) 06/06/2021   Leucocytosis 06/06/2021   Obsessive behavior 03/11/2021   History of CVA (cerebrovascular accident) 09/21/2020   Renal cyst 09/21/2020   Weakness 09/21/2020   Cerebral atrophy ([place]) 09/15/2020   Cerebrovascular accident (CVA) ([place])    Left facial numbness 09/12/2019   HTN (hypertension) 09/12/2019   GERD (gastroesophageal reflux disease) 09/12/2019   Arthritis 08/13/2018   Benign prostatic hyperplasia 08/13/2018   Diverticulosis 08/13/2018   History of anemia 08/13/2018   History of colonic polyps 08/13/2018   History of nephrolithiasis 08/13/2018   History of pneumonia 08/13/2018   Weight loss 12/15/2016   Healthcare maintenance 12/14/2016   Skin lesion of chest wall 09/23/2016   Anemia 09/17/2016   Hypercholesterolemia 09/17/2016   Incomplete emptying of bladder 02/02/2016   TIA (transient ischemic attack) 01/08/2016   Macular degeneration 01/08/2016   History of prostate cancer 01/08/2016   PVD (peripheral vascular disease) ([place]) 10/12/2015   SCC (squamous cell carcinoma) 01/24/2015   Bladder outlet obstruction 04/15/2013   Acquired cyst of kidney 04/16/2012   ED  (erectile dysfunction) of organic origin 04/16/2012    Orientation RESPIRATION BLADDER Height & Weight     Self, Place  Normal Continent Weight: 69.7 kg Height:  5\' 11"  (180.3 cm)  BEHAVIORAL SYMPTOMS/MOOD NEUROLOGICAL BOWEL NUTRITION STATUS      Continent Diet (regular)  AMBULATORY STATUS COMMUNICATION OF NEEDS Skin   Extensive Assist Verbally Normal, Surgical wounds                       Personal Care Assistance Level of Assistance  Bathing, Feeding, Dressing Bathing Assistance: Limited assistance Feeding assistance: Independent Dressing Assistance: Limited assistance     Functional Limitations Info             SPECIAL CARE FACTORS FREQUENCY  PT (By licensed PT)     PT Frequency: 5 times per week              Contractures Contractures Info: Not present    Additional Factors Info  Code Status, Allergies Code Status Info: Full code Allergies Info: Codeine           Current Medications (06/08/2021):  This is the current hospital active medication list Current Facility-Administered Medications  Medication Dose Route Frequency Provider Last Rate Last Admin   0.9 %  sodium chloride infusion  75 mL/hr Intravenous Continuous Thornton Park, MD 75 mL/hr at 06/07/21 1554 75 mL/hr at 06/07/21 1554   acetaminophen (TYLENOL) tablet 325-650 mg  325-650 mg Oral Q6H PRN Thornton Park, MD       alum & mag hydroxide-simeth (MAALOX/MYLANTA) 200-200-20 MG/5ML suspension 30 mL  30 mL Oral Q4H PRN Thornton Park, MD  aspirin EC tablet 81 mg  81 mg Oral Daily Thornton Park, MD       bisacodyl (DULCOLAX) suppository 10 mg  10 mg Rectal Daily PRN Thornton Park, MD       cholecalciferol (VITAMIN D3) tablet 1,000 Units  1,000 Units Oral Daily Thornton Park, MD       docusate sodium (COLACE) capsule 100 mg  100 mg Oral BID Thornton Park, MD   100 mg at 06/07/21 1755   enoxaparin (LOVENOX) injection 40 mg  40 mg Subcutaneous Q24H Thornton Park, MD        feeding supplement (ENSURE ENLIVE / ENSURE PLUS) liquid 237 mL  237 mL Oral BID BM Thornton Park, MD   237 mL at 06/08/21 3295   ferrous sulfate tablet 325 mg  325 mg Oral Q breakfast Thornton Park, MD       HYDROcodone-acetaminophen (NORCO) 7.5-325 MG per tablet 1-2 tablet  1-2 tablet Oral Q4H PRN Thornton Park, MD       HYDROcodone-acetaminophen (NORCO/VICODIN) 5-325 MG per tablet 1-2 tablet  1-2 tablet Oral Q4H PRN Thornton Park, MD       ketorolac (TORADOL) 30 MG/ML injection 15 mg  15 mg Intravenous Q6H PRN Thornton Park, MD   15 mg at 06/07/21 2040   lactated ringers infusion   Intravenous Continuous Thornton Park, MD 75 mL/hr at 06/07/21 1241 Continued from Pre-op at 06/07/21 1241   lisinopril (ZESTRIL) tablet 10 mg  10 mg Oral Daily Thornton Park, MD       morphine 2 MG/ML injection 0.5-1 mg  0.5-1 mg Intravenous Q2H PRN Thornton Park, MD       multivitamin with minerals tablet 1 tablet  1 tablet Oral Daily Thornton Park, MD       ondansetron [facility]) tablet 4 mg  4 mg Oral Q6H PRN Thornton Park, MD   4 mg at 06/07/21 1744   Or   ondansetron (ZOFRAN) injection 4 mg  4 mg Intravenous Q6H PRN Thornton Park, MD       polyethylene glycol (MIRALAX / GLYCOLAX) packet 17 g  17 g Oral Daily PRN Thornton Park, MD       promethazine (PHENERGAN) 6.25 mg in sodium chloride 0.9 % 50 mL IVPB  6.25 mg Intravenous Q6H PRN Thornton Park, MD 200 mL/hr at 06/07/21 0921 6.25 mg at 06/07/21 0921   QUEtiapine (SEROQUEL) tablet 50 mg  50 mg Oral QHS Thornton Park, MD   50 mg at 06/07/21 2041   senna (SENOKOT) tablet 8.6 mg  1 tablet Oral BID Thornton Park, MD   8.6 mg at 06/07/21 1755   sertraline (ZOLOFT) tablet 25 mg  25 mg Oral Daily Thornton Park, MD       tamsulosin [facility]) capsule 0.4 mg  0.4 mg Oral QPC breakfast Thornton Park, MD       traMADol [facility]) tablet 50 mg  50 mg Oral Q6H Thornton Park, MD   50 mg at 06/08/21 1884     Discharge  Medications: Please see discharge summary for a list of discharge medications.  Relevant Imaging Results:  Relevant Lab Results:   Additional Information SS3 [id number]  Conception Oms, RN

## 2021-06-08 NOTE — Progress Notes (Signed)
PROGRESS NOTE  [name]. FYB:[id number] DOB: 1930/02/20 DOA: 06/06/2021 PCP: Einar Pheasant, MD   LOS: 2 days   Brief Narrative / Interim history: 87 year old male with history of mild dementia, HTN, HLD, history of prostate cancer status post prostatectomy, comes into the hospital after having a mechanical fall at home, was found to have a right femoral neck fracture.  He tripped and landed on his right side.  No chest pain, syncope, loss of consciousness.  Denies hitting his head.  Orthopedic surgery consulted and he was admitted to the hospitalist service.  Subjective / 24h Interval events: He is sleepy this morning, but wakes up and is mildly confused.  Assessment & Plan: Principal Problem Mildly displaced intratrochanteric fracture of the right femoral neck-orthopedic surgery consulted, appreciate input.  He is status post IM fixation of the right intertrochanteric hip fracture on 11/23. -DVT prophylaxis postop per orthopedic surgery -PT evaluation pending, suspect he will need SNF  Active Problems Essential hypertension-blood pressure on the soft side  Acute blood loss anemia, postoperative-hemoglobin dropped to 6.1, no obvious bleeding.  Transfuse today and monitor H&H  Leukocytosis-due to hip fracture, likely reactive, improving today  Iron deficiency anemia-transfuse as above  History of CVA-resume aspirin.  ? BPH, history of prostate cancer status post prostatectomy-based on most recent outpatient urology notes he does not have a prostate anymore.  Continue to monitor for retention postoperatively  Mild dementia-stable, appears at baseline.  High risk of postop delirium   Scheduled Meds:  aspirin EC  81 mg Oral Daily   cholecalciferol  1,000 Units Oral Daily   docusate sodium  100 mg Oral BID   enoxaparin (LOVENOX) injection  40 mg Subcutaneous Q24H   feeding supplement  237 mL Oral BID BM   ferrous sulfate  325 mg Oral Q breakfast   lisinopril   10 mg Oral Daily   multivitamin with minerals  1 tablet Oral Daily   QUEtiapine  50 mg Oral QHS   senna  1 tablet Oral BID   sertraline  25 mg Oral Daily   tamsulosin  0.4 mg Oral QPC breakfast   traMADol  50 mg Oral Q6H   Continuous Infusions:  sodium chloride 75 mL/hr (06/07/21 1554)   lactated ringers 75 mL/hr at 06/07/21 1241   promethazine (PHENERGAN) injection (IM or IVPB) 6.25 mg (06/07/21 0921)   PRN Meds:.acetaminophen, alum & mag hydroxide-simeth, bisacodyl, HYDROcodone-acetaminophen, HYDROcodone-acetaminophen, ketorolac, morphine injection, ondansetron **OR** ondansetron (ZOFRAN) IV, polyethylene glycol, promethazine (PHENERGAN) injection (IM or IVPB)  Diet Orders (From admission, onward)     Start     Ordered   06/07/21 1518  Diet Heart Room service appropriate? Yes; Fluid consistency: Thin  Diet effective now       Question Answer Comment  Room service appropriate? Yes   Fluid consistency: Thin      06/07/21 1517            DVT prophylaxis: enoxaparin (LOVENOX) injection 40 mg Start: 06/08/21 0800 SCDs Start: 06/07/21 1518 Place TED hose Start: 06/07/21 1518     Code Status: Full Code  Family Communication: Granddaughter present at bedside  Status is: Inpatient  Remains inpatient appropriate because: surgery pending  Level of care: Med-Surg  Consultants:  Orthopedic surgery   Procedures:  none  Microbiology  none  Antimicrobials: none    Objective: Vitals:   06/07/21 1941 06/08/21 0436 06/08/21 0848 06/08/21 0935  BP: (!) 101/52 (!) 91/45 (!) 90/49 (!) 93/47  Pulse: 84 78  80 82  Resp: 16 17 15 16   Temp: 98.4 F (36.9 C) 97.9 F (36.6 C) 98.7 F (37.1 C) 98.1 F (36.7 C)  TempSrc:    Oral  SpO2: 99% 97% 97% 98%  Weight:      Height:        Intake/Output Summary (Last 24 hours) at 06/08/2021 1016 Last data filed at 06/07/2021 1526 Gross per 24 hour  Intake 801.25 ml  Output 130 ml  Net 671.25 ml    Filed Weights    06/06/21 2048 06/07/21 0106  Weight: 65 kg 69.7 kg    Examination:  Constitutional: No distress Eyes: Anicteric ENMT: mmm Neck: normal, supple Respiratory: Clear bilaterally, no wheezing, normal respiratory effort Cardiovascular: Regular rate and rhythm, normal, no peripheral edema Abdomen: Soft, NT, ND, bowel sounds positive Musculoskeletal: no clubbing / cyanosis.  Skin: No rashes seen Neurologic: No focal deficits  Data Reviewed: I have independently reviewed following labs and imaging studies  CBC: Recent Labs  Lab 06/06/21 2148 06/07/21 0122 06/08/21 0400  WBC 13.5* 14.2* 11.5*  NEUTROABS 11.2*  --   --   HGB 9.1* 9.0* 6.1*  HCT 29.2* 27.8* 19.1*  MCV 88.2 86.3 87.6  PLT 333 309 035    Basic Metabolic Panel: Recent Labs  Lab 06/06/21 2148 06/07/21 0122 06/08/21 0400  NA 136 136 136  K 4.0 3.9 4.1  CL 104 104 107  CO2 25 24 25   GLUCOSE 134* 146* 136*  BUN 23 24* 27*  CREATININE 0.79 0.75 1.08  CALCIUM 8.8* 8.7* 7.9*    Liver Function Tests: Recent Labs  Lab 06/06/21 2148 06/07/21 0122 06/08/21 0400  AST 14* 16 13*  ALT 9 8 8   ALKPHOS 96 89 72  BILITOT 0.5 0.5 0.5  PROT 6.4* 6.5 4.8*  ALBUMIN 3.4* 3.1* 2.4*    Coagulation Profile: Recent Labs  Lab 06/06/21 2148  INR 1.5*    HbA1C: No results for input(s): HGBA1C in the last 72 hours. CBG: No results for input(s): GLUCAP in the last 168 hours.  Recent Results (from the past 240 hour(s))  Resp Panel by RT-PCR (Flu A&B, Covid) Nasopharyngeal Swab     Status: None   Collection Time: 06/06/21 10:38 PM   Specimen: Nasopharyngeal Swab; Nasopharyngeal(NP) swabs in vial transport medium  Result Value Ref Range Status   SARS Coronavirus 2 by RT PCR NEGATIVE NEGATIVE Final    Comment: (NOTE) SARS-CoV-2 target nucleic acids are NOT DETECTED.  The SARS-CoV-2 RNA is generally detectable in upper respiratory specimens during the acute phase of infection. The lowest concentration of SARS-CoV-2  viral copies this assay can detect is 138 copies/mL. A negative result does not preclude SARS-Cov-2 infection and should not be used as the sole basis for treatment or other patient management decisions. A negative result may occur with  improper specimen collection/handling, submission of specimen other than nasopharyngeal swab, presence of viral mutation(s) within the areas targeted by this assay, and inadequate number of viral copies(<138 copies/mL). A negative result must be combined with clinical observations, patient history, and epidemiological information. The expected result is Negative.  Fact Sheet for Patients:  EntrepreneurPulse.com.au  Fact Sheet for Healthcare Providers:  IncredibleEmployment.be  This test is no t yet approved or cleared by the [place] FDA and  has been authorized for detection and/or diagnosis of SARS-CoV-2 by FDA under an Emergency Use Authorization (EUA). This EUA will remain  in effect (meaning this test can be used) for the duration of  the COVID-19 declaration under Section 564(b)(1) of the Act, 21 U.S.C.section 360bbb-3(b)(1), unless the authorization is terminated  or revoked sooner.       Influenza A by PCR NEGATIVE NEGATIVE Final   Influenza B by PCR NEGATIVE NEGATIVE Final    Comment: (NOTE) The Xpert Xpress SARS-CoV-2/FLU/RSV plus assay is intended as an aid in the diagnosis of influenza from Nasopharyngeal swab specimens and should not be used as a sole basis for treatment. Nasal washings and aspirates are unacceptable for Xpert Xpress SARS-CoV-2/FLU/RSV testing.  Fact Sheet for Patients: EntrepreneurPulse.com.au  Fact Sheet for Healthcare Providers: IncredibleEmployment.be  This test is not yet approved or cleared by the [place] FDA and has been authorized for detection and/or diagnosis of SARS-CoV-2 by FDA under an Emergency Use Authorization  (EUA). This EUA will remain in effect (meaning this test can be used) for the duration of the COVID-19 declaration under Section 564(b)(1) of the Act, 21 U.S.C. section 360bbb-3(b)(1), unless the authorization is terminated or revoked.  Performed at [facility], [address] [place], [place] [address]   MRSA Next Gen by PCR, Nasal     Status: None   Collection Time: 06/07/21  1:39 AM   Specimen: Nasal Mucosa; Nasal Swab  Result Value Ref Range Status   MRSA by PCR Next Gen NOT DETECTED NOT DETECTED Final    Comment: (NOTE) The GeneXpert MRSA Assay (FDA approved for NASAL specimens only), is one component of a comprehensive MRSA colonization surveillance program. It is not intended to diagnose MRSA infection nor to guide or monitor treatment for MRSA infections. Test performance is not FDA approved in patients less than 71 years old. Performed at [facility], [address], [place], [place] [address]       Radiology Studies: DG C-Arm 1-60 Min-No Report  Result Date: 06/07/2021 Fluoroscopy was utilized by the requesting physician.  No radiographic interpretation.   DG HIP UNILAT WITH PELVIS 2-3 VIEWS RIGHT  Result Date: 06/07/2021 CLINICAL DATA:  Intramedullary rod fixation of intertrochanteric femur fracture. EXAM: DG HIP (WITH OR WITHOUT PELVIS) 2-3V RIGHT; DG C-ARM 1-60 MIN-NO REPORT COMPARISON:  Right hip radiographs-06/06/2021 FLUOROSCOPY TIME:  1 minute, 45 seconds FINDINGS: Five spot intraoperative fluoroscopic images of the right hip and distal aspect the right femur are provided for review and demonstrate the sequela of intramedullary rod fixation of the right femur and dynamic screw fixation of the right femoral neck with improved alignment of known comminuted intratrochanteric femur fracture. The distal end of the femoral intramedullary rod is transfixed with 2 cancellous screws. Post right total knee replacement, incompletely evaluated. Expected  subcutaneous emphysema about the operative site. No radiopaque foreign body. IMPRESSION: Post intramedullary rod fixation of right femur and dynamic screw fixation femoral neck without evidence of hardware failure or loosening. Electronically Signed   By: Sandi Mariscal M.D.   On: 06/07/2021 15:37   DG FEMUR, MIN 2 VIEWS RIGHT  Result Date: 06/07/2021 CLINICAL DATA:  Intramedullary rod fixation of intertrochanteric femur fracture. EXAM: RIGHT FEMUR 2 VIEWS COMPARISON:  Right hip radiographs-earlier same day FINDINGS: Post intramedullary rod fixation of comminuted intertrochanteric femur fracture and dynamic screw fixation of the right femoral neck. Alignment appears near anatomic. The distal end of femoral rod is transfixed with 2 cancellous screws. There is a minimal amount of expected subcutaneous emphysema about the operative site. No radiopaque foreign body. Post right total knee replacement, incompletely evaluated. Scattered adjacent vascular calcifications. Brachytherapy seeds overlies expected location of the prostate  gland. IMPRESSION: Post intramedullary rod and dynamic screw fixation of intertrochanteric femur fracture without evidence of complication. Electronically Signed   By: Sandi Mariscal M.D.   On: 06/07/2021 15:39     Marzetta Board, MD, PhD [facility]  Between 7 am - 7 pm I am available, please contact me via Amion (for emergencies) or Securechat (non urgent messages)  Between 7 pm - 7 am I am not available, please contact night coverage MD/APP via Amion

## 2021-06-08 NOTE — TOC Progression Note (Signed)
Transition of Care [facility]) - Progression Note    Patient Details  Name: Todd Trujillo. MRN: [id number] Date of Birth: 28-Jan-1930  Transition of Care [facility]) CM/SW [place], RN Phone Number: 06/08/2021, 11:34 AM  Clinical Narrative:   Met with the patient and his daughter at the bedside, the patient is alert to self and place but not time or month, They are agreeable to a bed search and the daughter stated that she has already reached out to [facility] and know sthey are full but also has reached out to [facility], anjd would be open to them as well, Will do a bed search and then review the bed options         Expected Discharge Plan and Services                                                 Social Determinants of Health (SDOH) Interventions    Readmission Risk Interventions No flowsheet data found.

## 2021-06-09 DIAGNOSIS — I10 Essential (primary) hypertension: Secondary | ICD-10-CM | POA: Diagnosis not present

## 2021-06-09 DIAGNOSIS — D72829 Elevated white blood cell count, unspecified: Secondary | ICD-10-CM | POA: Diagnosis not present

## 2021-06-09 DIAGNOSIS — W19XXXA Unspecified fall, initial encounter: Secondary | ICD-10-CM | POA: Diagnosis not present

## 2021-06-09 DIAGNOSIS — S72141A Displaced intertrochanteric fracture of right femur, initial encounter for closed fracture: Secondary | ICD-10-CM | POA: Diagnosis not present

## 2021-06-09 LAB — BASIC METABOLIC PANEL
Anion gap: 3 — ABNORMAL LOW (ref 5–15)
BUN: 32 mg/dL — ABNORMAL HIGH (ref 8–23)
CO2: 24 mmol/L (ref 22–32)
Calcium: 7.9 mg/dL — ABNORMAL LOW (ref 8.9–10.3)
Chloride: 107 mmol/L (ref 98–111)
Creatinine, Ser: 0.94 mg/dL (ref 0.61–1.24)
GFR, Estimated: >60 mL/min (ref >60)
Glucose, Bld: 107 mg/dL — ABNORMAL HIGH (ref 70–99)
Potassium: 4.3 mmol/L (ref 3.5–5.1)
Sodium: 134 mmol/L — ABNORMAL LOW (ref 135–145)

## 2021-06-09 LAB — CBC
HCT: 22.7 % — ABNORMAL LOW (ref 39.0–52.0)
Hemoglobin: 7.5 g/dL — ABNORMAL LOW (ref 13.0–17.0)
MCH: 28.5 pg (ref 26.0–34.0)
MCHC: 33 g/dL (ref 30.0–36.0)
MCV: 86.3 fL (ref 80.0–100.0)
Platelets: 183 10*3/uL (ref 150–400)
RBC: 2.63 MIL/uL — ABNORMAL LOW (ref 4.22–5.81)
RDW: 14.9 % (ref 11.5–15.5)
WBC: 10.7 10*3/uL — ABNORMAL HIGH (ref 4.0–10.5)
nRBC: 0 % (ref 0.0–0.2)

## 2021-06-09 LAB — PREPARE RBC (CROSSMATCH)

## 2021-06-09 MED ORDER — ACETAMINOPHEN 500 MG PO TABS
1000.0000 mg | ORAL_TABLET | Freq: Four times a day (QID) | ORAL | Status: AC
  Administered 2021-06-10 (×3): 1000 mg via ORAL
  Filled 2021-06-09 (×3): qty 2

## 2021-06-09 MED ORDER — ASPIRIN EC 325 MG PO TBEC
325.0000 mg | DELAYED_RELEASE_TABLET | Freq: Two times a day (BID) | ORAL | Status: DC
  Filled 2021-06-09: qty 1

## 2021-06-09 MED ORDER — OXYCODONE HCL 5 MG PO TABS: 5.0000 mg | ORAL_TABLET | Freq: Four times a day (QID) | ORAL | Status: DC | PRN

## 2021-06-09 MED ORDER — ASPIRIN 81 MG PO CHEW
324.0000 mg | CHEWABLE_TABLET | Freq: Two times a day (BID) | ORAL | Status: DC
  Administered 2021-06-09 – 2021-06-11 (×5): 324 mg via ORAL
  Filled 2021-06-09 (×5): qty 4

## 2021-06-09 MED ORDER — PANTOPRAZOLE SODIUM 40 MG PO TBEC
40.0000 mg | DELAYED_RELEASE_TABLET | Freq: Every day | ORAL | Status: DC
Start: 2021-06-09 — End: 2021-06-12
  Administered 2021-06-10 – 2021-06-11 (×2): 40 mg via ORAL
  Filled 2021-06-09 (×3): qty 1

## 2021-06-09 NOTE — Progress Notes (Signed)
Nutrition Follow-up  DOCUMENTATION CODES:   Not applicable  INTERVENTION:   -Ensure Enlive po BID, each supplement provides 350 kcal and 20 grams of protein  -MVI with minerals daily -Magic cup TID with meals, each supplement provides 290 kcal and 9 grams of protein   NUTRITION DIAGNOSIS:   Increased nutrient needs related to post-op healing as evidenced by estimated needs.  Ongoing  GOAL:   Patient will meet greater than or equal to 90% of their needs  Progressing   MONITOR:   PO intake, Supplement acceptance, Diet advancement, Labs, Weight trends, Skin, I & O's  REASON FOR ASSESSMENT:   Malnutrition Screening Tool    ASSESSMENT:   Todd Summer. is a 87 y.o. male with medical history significant of dementia, hyperlipidemia, hypertension, macular degeneration of the eye, BPH, squamous cell carcinoma of the skin who had a mechanical fall at home and came in with right femoral neck fracture.  Patient apparently tripped and fell landing on the right side.  Denied hitting his head.  Denied any dizziness prior to the event denied any other significant injury.  Patient was seen and evaluated in the ER.  Work-up showed right intertrochanteric femur fracture.  No other fractures or injury.  Patient appears hemodynamically stable except for the pain.  Orthopedic consulted and patient being admitted for operative repair.  Patient denied any other complaints at the moment.  11/23- s/p PROCEDURE: Intramedullary fixation of right intertrochanteric hip fracture   Reviewed I/O's: +100 ml x 24 hours and +1.2 L since admission  UOP: 650 ml x 24 hours  Spoke with pt and wife at bedside. Pt reports feeling a little better today, but complains in his rt leg. Pt wife provides most of the history. She reports pt is a very selective eater. He consumes 3 meals per day (Breakfast: cereal OR scrambled eggs, toast, and bacon; Lunch: hamburger; Dinner: vegetable). Pt rarely eats meat.    Per meal completions, pt has been consuming 50% of meals. Family has also been bringing in food (chocolate cake and [place]).   Pt wife unsure of UBW, but reports that pt has lost weight progressively over the past several years. Reviewed wt hx; wt has been stable over the past 3 months.   Medications reviewed and include vitamin D3, ferrous sulfate, and senokot.   Labs reviewed: Na: 134.    NUTRITION - FOCUSED PHYSICAL EXAM:  Flowsheet Row Most Recent Value  Orbital Region No depletion  Upper Arm Region Mild depletion  Thoracic and Lumbar Region No depletion  Buccal Region No depletion  Temple Region No depletion  Clavicle Bone Region Moderate depletion  Clavicle and Acromion Bone Region Moderate depletion  Scapular Bone Region Moderate depletion  Dorsal Hand No depletion  Patellar Region No depletion  Anterior Thigh Region No depletion  Posterior Calf Region No depletion  Edema (RD Assessment) Mild  Hair Reviewed  Eyes Reviewed  Mouth Reviewed  Skin Reviewed  Nails Reviewed       Diet Order:   Diet Order             Diet Heart Room service appropriate? Yes; Fluid consistency: Thin  Diet effective now                   EDUCATION NEEDS:   No education needs have been identified at this time  Skin:  Skin Assessment: Skin Integrity Issues: Skin Integrity Issues:: Incisions Incisions: closed rt hip, rt lateral thigh  Last BM:  06/05/21  Height:   Ht Readings from Last 1 Encounters:  06/07/21 5\' 11"  (1.803 m)    Weight:   Wt Readings from Last 1 Encounters:  06/07/21 69.7 kg    Ideal Body Weight:  78.2 kg  BMI:  Body mass index is 21.42 kg/m.  Estimated Nutritional Needs:   Kcal:  1900-2100  Protein:  90-105 grams  Fluid:  > 1.9 L    Loistine Chance, RD, LDN, [place] Registered Dietitian II Certified Diabetes Care and Education Specialist Please refer to [facility] for RD and/or RD on-call/weekend/after hours pager

## 2021-06-09 NOTE — Progress Notes (Signed)
PROGRESS NOTE  Todd Trujillo. TZG:[id number] DOB: [birth year] DOA: 06/06/2021 PCP: Einar Pheasant, MD   LOS: 3 days   Brief Narrative / Interim history: 87 year old male with history of mild dementia, HTN, HLD, history of prostate cancer status post prostatectomy, comes into the hospital after having a mechanical fall at home, was found to have a right femoral neck fracture.  He tripped and landed on his right side.  No chest pain, syncope, loss of consciousness.  Denies hitting his head.  Orthopedic surgery consulted and he was admitted to the hospitalist service.  He is status post surgical repair 11/23 alert, remains confused.  Hard of hearing.  Subjective / 24h Interval events: He is sleepy this morning, but wakes up and is mildly confused.  Assessment & Plan: Principal Problem Mildly displaced intratrochanteric fracture of the right femoral neck-orthopedic surgery consulted, appreciate input.  He is status post IM fixation of the right intertrochanteric hip fracture on 11/23. -DVT prophylaxis postop per orthopedic surgery, changed Lovenox to aspirin today due to persistent anemia -PT recommends SNF however wife adamant that she was taking home  Active Problems Essential hypertension-blood pressure remains on the soft side.  Stop lisinopril  Acute blood loss anemia, postoperative-hemoglobin dropped to 6.1 on 11/24, no obvious bleeding.  He was transfused 2 units of packed red blood cells and hemoglobin improved to 7.5 today.  Transfuse an additional unit  Leukocytosis-due to hip fracture, likely reactive, improving today  Iron deficiency anemia-transfuse as above  History of CVA-resume aspirin.  ? BPH, history of prostate cancer status post prostatectomy-based on most recent outpatient urology notes he does not have a prostate anymore.  Continue to monitor for retention postoperatively  Mild dementia-stable, appears at baseline.  High risk of postop  delirium   Scheduled Meds:  aspirin  324 mg Oral BID   cholecalciferol  1,000 Units Oral Daily   docusate sodium  100 mg Oral BID   feeding supplement  237 mL Oral BID BM   ferrous sulfate  325 mg Oral Q breakfast   multivitamin with minerals  1 tablet Oral Daily   pantoprazole  40 mg Oral Daily   QUEtiapine  50 mg Oral QHS   senna  1 tablet Oral BID   sertraline  25 mg Oral Daily   tamsulosin  0.4 mg Oral QPC breakfast   traMADol  50 mg Oral Q6H   Continuous Infusions:  lactated ringers 75 mL/hr at 06/07/21 1241   methocarbamol (ROBAXIN) IV     promethazine (PHENERGAN) injection (IM or IVPB) 6.25 mg (06/07/21 0921)   PRN Meds:.acetaminophen, alum & mag hydroxide-simeth, bisacodyl, HYDROcodone-acetaminophen, HYDROcodone-acetaminophen, ketorolac, methocarbamol (ROBAXIN) IV, methocarbamol, morphine injection, ondansetron **OR** ondansetron (ZOFRAN) IV, polyethylene glycol, promethazine (PHENERGAN) injection (IM or IVPB)  Diet Orders (From admission, onward)     Start     Ordered   06/09/21 1304  Diet regular Room service appropriate? Yes; Fluid consistency: Thin  Diet effective now       Question Answer Comment  Room service appropriate? Yes   Fluid consistency: Thin      06/09/21 1303            DVT prophylaxis: SCDs Start: 06/07/21 1518 Place TED hose Start: 06/07/21 1518     Code Status: Full Code  Family Communication: Daughter present at bedside  Status is: Inpatient  Remains inpatient appropriate because: Acute blood loss anemia  Level of care: Med-Surg  Consultants:  Orthopedic surgery   Procedures:  none  Microbiology  none  Antimicrobials: none    Objective: Vitals:   06/09/21 0525 06/09/21 0817 06/09/21 1047 06/09/21 1111  BP: (!) 98/53 (!) 103/48 (!) 104/49 110/90  Pulse: 71 76 77 85  Resp: 16 16 16 16   Temp: 98.1 F (36.7 C) 98.1 F (36.7 C) 98.1 F (36.7 C) 98.2 F (36.8 C)  TempSrc:  Oral Oral Oral  SpO2: 99% 99% 96% 98%   Weight:      Height:        Intake/Output Summary (Last 24 hours) at 06/09/2021 1328 Last data filed at 06/09/2021 1026 Gross per 24 hour  Intake 590 ml  Output 650 ml  Net -60 ml    Filed Weights   06/06/21 2048 06/07/21 0106  Weight: 65 kg 69.7 kg    Examination:  Constitutional: NAD Eyes: No scleral icterus ENMT: mmm Neck: normal, supple Respiratory: Clear bilaterally, no wheezing Cardiovascular: Regular rate and rhythm, no peripheral edema Abdomen: Soft, NT, ND, bowel sounds positive Musculoskeletal: no clubbing / cyanosis.  Skin: No rashes appreciated Neurologic: Nonfocal, equal strength  Data Reviewed: I have independently reviewed following labs and imaging studies  CBC: Recent Labs  Lab 06/06/21 2148 06/07/21 0122 06/08/21 0400 06/08/21 1252 06/08/21 1954 06/09/21 0303  WBC 13.5* 14.2* 11.5*  --   --  10.7*  NEUTROABS 11.2*  --   --   --   --   --   HGB 9.1* 9.0* 6.1* 7.3* 8.1* 7.5*  HCT 29.2* 27.8* 19.1* 23.1* 25.2* 22.7*  MCV 88.2 86.3 87.6  --   --  86.3  PLT 333 309 206  --   --  563    Basic Metabolic Panel: Recent Labs  Lab 06/06/21 2148 06/07/21 0122 06/08/21 0400 06/09/21 0303  NA 136 136 136 134*  K 4.0 3.9 4.1 4.3  CL 104 104 107 107  CO2 25 24 25 24   GLUCOSE 134* 146* 136* 107*  BUN 23 24* 27* 32*  CREATININE 0.79 0.75 1.08 0.94  CALCIUM 8.8* 8.7* 7.9* 7.9*    Liver Function Tests: Recent Labs  Lab 06/06/21 2148 06/07/21 0122 06/08/21 0400  AST 14* 16 13*  ALT 9 8 8   ALKPHOS 96 89 72  BILITOT 0.5 0.5 0.5  PROT 6.4* 6.5 4.8*  ALBUMIN 3.4* 3.1* 2.4*    Coagulation Profile: Recent Labs  Lab 06/06/21 2148  INR 1.5*    HbA1C: No results for input(s): HGBA1C in the last 72 hours. CBG: No results for input(s): GLUCAP in the last 168 hours.  Recent Results (from the past 240 hour(s))  Resp Panel by RT-PCR (Flu A&B, Covid) Nasopharyngeal Swab     Status: None   Collection Time: 06/06/21 10:38 PM   Specimen:  Nasopharyngeal Swab; Nasopharyngeal(NP) swabs in vial transport medium  Result Value Ref Range Status   SARS Coronavirus 2 by RT PCR NEGATIVE NEGATIVE Final    Comment: (NOTE) SARS-CoV-2 target nucleic acids are NOT DETECTED.  The SARS-CoV-2 RNA is generally detectable in upper respiratory specimens during the acute phase of infection. The lowest concentration of SARS-CoV-2 viral copies this assay can detect is 138 copies/mL. A negative result does not preclude SARS-Cov-2 infection and should not be used as the sole basis for treatment or other patient management decisions. A negative result may occur with  improper specimen collection/handling, submission of specimen other than nasopharyngeal swab, presence of viral mutation(s) within the areas targeted by this assay, and inadequate number of viral copies(<138 copies/mL). A negative  result must be combined with clinical observations, patient history, and epidemiological information. The expected result is Negative.  Fact Sheet for Patients:  EntrepreneurPulse.com.au  Fact Sheet for Healthcare Providers:  IncredibleEmployment.be  This test is no t yet approved or cleared by the [place] FDA and  has been authorized for detection and/or diagnosis of SARS-CoV-2 by FDA under an Emergency Use Authorization (EUA). This EUA will remain  in effect (meaning this test can be used) for the duration of the COVID-19 declaration under Section 564(b)(1) of the Act, 21 U.S.C.section 360bbb-3(b)(1), unless the authorization is terminated  or revoked sooner.       Influenza A by PCR NEGATIVE NEGATIVE Final   Influenza B by PCR NEGATIVE NEGATIVE Final    Comment: (NOTE) The Xpert Xpress SARS-CoV-2/FLU/RSV plus assay is intended as an aid in the diagnosis of influenza from Nasopharyngeal swab specimens and should not be used as a sole basis for treatment. Nasal washings and aspirates are unacceptable for  Xpert Xpress SARS-CoV-2/FLU/RSV testing.  Fact Sheet for Patients: EntrepreneurPulse.com.au  Fact Sheet for Healthcare Providers: IncredibleEmployment.be  This test is not yet approved or cleared by the [place] FDA and has been authorized for detection and/or diagnosis of SARS-CoV-2 by FDA under an Emergency Use Authorization (EUA). This EUA will remain in effect (meaning this test can be used) for the duration of the COVID-19 declaration under Section 564(b)(1) of the Act, 21 U.S.C. section 360bbb-3(b)(1), unless the authorization is terminated or revoked.  Performed at [facility], [address] [place], [place] [address]   MRSA Next Gen by PCR, Nasal     Status: None   Collection Time: 06/07/21  1:39 AM   Specimen: Nasal Mucosa; Nasal Swab  Result Value Ref Range Status   MRSA by PCR Next Gen NOT DETECTED NOT DETECTED Final    Comment: (NOTE) The GeneXpert MRSA Assay (FDA approved for NASAL specimens only), is one component of a comprehensive MRSA colonization surveillance program. It is not intended to diagnose MRSA infection nor to guide or monitor treatment for MRSA infections. Test performance is not FDA approved in patients less than 102 years old. Performed at [facility], [address] [place], [place] [address]       Radiology Studies: No results found.   Marzetta Board, MD, PhD [facility]  Between 7 am - 7 pm I am available, please contact me via Amion (for emergencies) or Securechat (non urgent messages)  Between 7 pm - 7 am I am not available, please contact night coverage MD/APP via Amion

## 2021-06-09 NOTE — TOC Progression Note (Addendum)
Transition of Care [facility]) - Progression Note    Patient Details  Name: Todd Trujillo. MRN: [id number] Date of Birth: March 10, 1930  Transition of Care [facility]) CM/SW [place], RN Phone Number: 06/09/2021, 12:31 PM  Clinical Narrative:   Went in the room to speak with the patient and the wife concerning the bed options for STR, His wife stated that she wants to take him home, she has assistance at home and will get a hospital bed if needed, They want to get [facility] services and not go to a STR facility.  She stated that she does not feel that he will remember to call out for the nurse and if she or other fanily has to stay with him then they can care for him at home, She has assistance from her daughter that lives there and another daughter that comes daily. He special needs grand daughter also lives there.  She feels this is in his best interest and the patient agrees.  She asked to speak to the Doctor and I notified him I explained that he can not stay in the hospital for rehab until he gets strong enough to go home, I explained that is what STR SNF is intended for, She stated that she will hire more help if needed. I encouraged her to talk with her daughters about this as well, She stated it is not their choice it is hers and the patients and they say he is going home at DC not to STR.          Expected Discharge Plan and Services                                                 Social Determinants of Health (SDOH) Interventions    Readmission Risk Interventions No flowsheet data found.

## 2021-06-09 NOTE — Progress Notes (Signed)
Subjective:  POD #2 s/p intramedullary fixation for right intertrochanteric hip fracture.   Patient has dementia and is unable to provide an extensive history, but indicates he is not having significant right hip pain.  His daughter and son-in-law are at the bedside.  I reviewed the patient's physical therapy progress note today.  Patient received another unit of PRBCs today for hemoglobin of 7.5 this morning.  Patient had received 2 units of PRBCs yesterday.  Objective:   VITALS:   Vitals:   06/09/21 1047 06/09/21 1111 06/09/21 1439 06/09/21 1559  BP: (!) 104/49 110/90 (!) 116/52 (!) 106/44  Pulse: 77 85 84 81  Resp: 16 16 16 18   Temp: 98.1 F (36.7 C) 98.2 F (36.8 C) 98.5 F (36.9 C) 99.2 F (37.3 C)  TempSrc: Oral Oral Oral   SpO2: 96% 98% 97% 96%  Weight:      Height:        PHYSICAL EXAM: Right lower extremity Neurovascular intact Sensation intact distally Intact pulses distally Dorsiflexion/Plantar flexion intact Incision: dressing C/D/I No cellulitis present Compartment soft  LABS  Results for orders placed or performed during the hospital encounter of 06/06/21 (from the past 24 hour(s))  Hemoglobin and hematocrit, blood     Status: Abnormal   Collection Time: 06/08/21  7:54 PM  Result Value Ref Range   Hemoglobin 8.1 (L) 13.0 - 17.0 g/dL   HCT 25.2 (L) 39.0 - 79.0 %  Basic metabolic panel     Status: Abnormal   Collection Time: 06/09/21  3:03 AM  Result Value Ref Range   Sodium 134 (L) 135 - 145 mmol/L   Potassium 4.3 3.5 - 5.1 mmol/L   Chloride 107 98 - 111 mmol/L   CO2 24 22 - 32 mmol/L   Glucose, Bld 107 (H) 70 - 99 mg/dL   BUN 32 (H) 8 - 23 mg/dL   Creatinine, Ser 0.94 0.61 - 1.24 mg/dL   Calcium 7.9 (L) 8.9 - 10.3 mg/dL   GFR, Estimated >60 >60 mL/min   Anion gap 3 (L) 5 - 15  CBC     Status: Abnormal   Collection Time: 06/09/21  3:03 AM  Result Value Ref Range   WBC 10.7 (H) 4.0 - 10.5 K/uL   RBC 2.63 (L) 4.22 - 5.81 MIL/uL   Hemoglobin 7.5  (L) 13.0 - 17.0 g/dL   HCT 22.7 (L) 39.0 - 52.0 %   MCV 86.3 80.0 - 100.0 fL   MCH 28.5 26.0 - 34.0 pg   MCHC 33.0 30.0 - 36.0 g/dL   RDW 14.9 11.5 - 15.5 %   Platelets 183 150 - 400 K/uL   nRBC 0.0 0.0 - 0.2 %  Prepare RBC (crossmatch)     Status: None   Collection Time: 06/09/21  8:30 AM  Result Value Ref Range   Order Confirmation      ORDER PROCESSED BY BLOOD BANK Performed at [facility], [address] [place], [place] [address]     No results found.  Assessment/Plan: 2 Days Post-Op   Principal Problem:   Right femoral fracture (HCC) Active Problems:   Hypercholesterolemia   Benign prostatic hyperplasia   HTN (hypertension)   GERD (gastroesophageal reflux disease)   Cerebrovascular accident (CVA) ([place])   Leucocytosis  Patient denies pain at rest this evening.  His progress note from physical therapy indicated that he was having pain with movement of his right lower extremity.  Patient required maximum assist getting up from bed and  returning to bed.  Physical therapy has recommended skilled nursing facility.  The family is apprehensive given the facilities available.  Patient will continue with physical therapy and the family will continue working with social work here at [facility] on possible skilled nursing facility placement.  Lovenox was discontinued today given the patient's drop in hemoglobin.  He is started on enteric-coated aspirin 325 mg p.o. twice daily for DVT prophylaxis.  Patient should wear TED hose during the day.    Thornton Park , MD 06/09/2021, 5:35 PM

## 2021-06-09 NOTE — Care Management Important Message (Signed)
Important Message  Patient Details  Name: Todd Trujillo. MRN: [id number] Date of Birth: [birth year]   Medicare Important Message Given:  Yes     Dannette Barbara 06/09/2021, 11:36 AM

## 2021-06-09 NOTE — Evaluation (Signed)
Occupational Therapy Evaluation Patient Details Name: Todd Trujillo. MRN: [id number] DOB: 16-Jun-1930 Today's Date: 06/09/2021   History of Present Illness Pt is a 87 y/o male with medical hx significant for dementia, hyperlipidemia, HTN, macular degeneration of the eyes, BPH, who had a mechanical fall in his home that resulted in a R femoral neck fracture. Pt is now s/p R intramedullary fixation.   Clinical Impression   Todd Trujillo was seen for OT evaluation this date. Prior to hospital admission, pt required assist for ADLs, MOD I using SPC for limited household mobility. Pt lives with spouse and adult daughter in 2 level home, lives on main level. Pt presents to acute OT demonstrating impaired ADL performance and functional mobility 2/2 decreased activity tolerance and functional strength/ROM/balance deficits. Pt currently requires MAX A don/doff B socks at bed level. MIN A + BUE support static sitting  - unable to progress to seated grooming tasks. Pt tolerates ~5 min sitting BLE exercises prior to requesting return to bed.  Anticipate +2 for toilet t/f. Pt would benefit from skilled OT to address noted impairments and functional limitations (see below for any additional details) in order to maximize safety and independence while minimizing falls risk and caregiver burden. Upon hospital discharge, recommend STR to maximize pt safety and return to PLOF.       Recommendations for follow up therapy are one component of a multi-disciplinary discharge planning process, led by the attending physician.  Recommendations may be updated based on patient status, additional functional criteria and insurance authorization.   Follow Up Recommendations  Skilled nursing-short term rehab (<3 hours/day)    Assistance Recommended at Discharge Frequent or constant Supervision/Assistance  Functional Status Assessment  Patient has had a recent decline in their functional status and demonstrates the  ability to make significant improvements in function in a reasonable and predictable amount of time.  Equipment Recommendations  Other (comment) (defer to next venue of care)    Recommendations for Other Services       Precautions / Restrictions Precautions Precautions: Fall Restrictions Weight Bearing Restrictions: Yes RLE Weight Bearing: Weight bearing as tolerated      Mobility Bed Mobility Overal bed mobility: Needs Assistance Bed Mobility: Supine to Sit;Sit to Supine     Supine to sit: Max assist;HOB elevated Sit to supine: Max assist;+2 for physical assistance        Transfers                   General transfer comment: not tested 2/2 poor sitting balance and pt requesting return to bed      Balance Overall balance assessment: Needs assistance Sitting-balance support: Bilateral upper extremity supported;Feet supported Sitting balance-Todd Trujillo Scale: Fair Sitting balance - Comments: intermittent CGA decreasting to MIN A as pt fatigues                                   ADL either performed or assessed with clinical judgement   ADL Overall ADL's : Needs assistance/impaired                                       General ADL Comments: MAX A don/doff B socks at bed level. MIN A + BUE support static sitting  - unable to progress to seated grooming tasks. Anticipate +2 for toilet t/f  Pertinent Vitals/Pain Pain Assessment: Faces Faces Pain Scale: Hurts even more Pain Location: RLE Pain Descriptors / Indicators: Aching;Discomfort;Grimacing;Operative site guarding Pain Intervention(s): Limited activity within patient's tolerance;Repositioned     Hand Dominance Right   Extremity/Trunk Assessment Upper Extremity Assessment Upper Extremity Assessment: Generalized weakness   Lower Extremity Assessment Lower Extremity Assessment: Generalized weakness       Communication Communication Communication: HOH   Cognition  Arousal/Alertness: Awake/alert Behavior During Therapy: WFL for tasks assessed/performed Overall Cognitive Status: History of cognitive impairments - at baseline                                 General Comments: repeatedly responds yes to questions     General Comments  RN cleared for sessin with blood transfusion running, in place at start/end of session    Exercises Exercises: Other exercises;General Lower Extremity General Exercises - Lower Extremity Ankle Circles/Pumps: AROM;Strengthening;Both;10 reps;Seated Long Arc Quad: AROM;Strengthening;Both;10 reps;Seated Other Exercises Other Exercises: Pt and family educated re: OT role, DME recs, d/c recs, falls prevention, pain mgmt Other Exercises: LBD< sup<>sit, sitting balance/tolerance   Shoulder Instructions      Home Living Family/patient expects to be discharged to:: Private residence Living Arrangements: Spouse/significant other;Children (lives with adult daughter and adult granddaughter who is special needs. Spouse and adult daughter are able to assist) Available Help at Discharge: Family;Available 24 hours/day Type of Home: House Home Access: Stairs to enter [employer] of Steps: 3 Entrance Stairs-Rails: Right;Left Home Layout: Two level;Able to live on main level with bedroom/bathroom Alternate Level Stairs-Number of Steps: Pt spouse reports pt does not go to second floor, is able to live on first level   Bathroom Shower/Tub: Occupational psychologist: Standard     Home Equipment: Cane - single Barista (2 wheels)          Prior Functioning/Environment Prior Level of Function : Needs assist  Cognitive Assist : ADLs (cognitive)           Mobility Comments: Pt limited household ambulator per spouse, used [facility] ADLs Comments: Pt with dementia, requires assist with ADLs        OT Problem List: Decreased strength;Decreased range of motion;Impaired balance (sitting  and/or standing);Decreased activity tolerance;Decreased safety awareness;Decreased knowledge of use of DME or AE;Pain      OT Treatment/Interventions: Self-care/ADL training;Therapeutic exercise;Energy conservation;DME and/or AE instruction;Therapeutic activities;Patient/family education;Balance training    OT Goals(Current goals can be found in the care plan section) Acute Rehab OT Goals Patient Stated Goal: to feel better OT Goal Formulation: With patient/family Time For Goal Achievement: 06/23/21 Potential to Achieve Goals: Good ADL Goals Pt Will Perform Grooming: with modified independence;sitting Pt Will Perform Lower Body Dressing: with mod assist;sitting/lateral leans Pt Will Transfer to Toilet: with max assist;stand pivot transfer;bedside commode (c LRAD PRN)  OT Frequency: Min 2X/week   Barriers to D/C: Inaccessible home environment             AM-PAC OT "6 Clicks" Daily Activity     Outcome Measure Help from another person eating meals?: A Little Help from another person taking care of personal grooming?: A Lot Help from another person toileting, which includes using toliet, bedpan, or urinal?: A Lot Help from another person bathing (including washing, rinsing, drying)?: A Lot Help from another person to put on and taking off regular upper body clothing?: A Little Help from another person to put on and taking  off regular lower body clothing?: A Lot 6 Click Score: 14   End of Session Nurse Communication: Mobility status  Activity Tolerance: Patient tolerated treatment well Patient left: in bed;with call bell/phone within reach;with bed alarm set;with family/visitor present  OT Visit Diagnosis: Other abnormalities of gait and mobility (R26.89);Muscle weakness (generalized) (M62.81)                Time: 1130-1145 OT Time Calculation (min): 15 min Charges:  OT General Charges $OT Visit: 1 Visit OT Evaluation $OT Eval Low Complexity: 1 Low  Dessie Coma, M.S. OTR/L   06/09/21, 1:35 PM  ascom [phone]

## 2021-06-09 NOTE — Progress Notes (Signed)
Physical Therapy Treatment Patient Details Name: Todd Trujillo. MRN: [id number] DOB: 1930-05-14 Today's Date: 06/09/2021   History of Present Illness Pt is a 87 y/o male with medical hx significant for dementia, hyperlipidemia, HTN, macular degeneration of the eyes, BPH, who had a mechanical fall in his home that resulted in a R femoral neck fracture. Pt is now s/p R intramedullary fixation.    PT Comments    Pt was long sitting in bed upon arriving. He has already receive transfusion earlier this date. He agrees to session with encouragement but session is severely limited by cognition and pain. Supportive spouse is at bedside. He required max assist to exit and return to bed.Throughout session, pt makes facial expressions of pain with any/all movements of RLE. Stood 2 x with max assist from elevated bed height. With poor posture and standing tolerance noted. Discussed at length with pt/pt's spouse about concerns with [facility] home without STR prior. Acute PT HIGHLY recommends DC to rehab however pt's spouse is planning to take pt home. Acute PT will continue to follow per current POC, progressing as able.   Recommendations for follow up therapy are one component of a multi-disciplinary discharge planning process, led by the attending physician.  Recommendations may be updated based on patient status, additional functional criteria and insurance authorization.  Follow Up Recommendations  Skilled nursing-short term rehab (<3 hours/day)     Assistance Recommended at Discharge Frequent or constant Supervision/Assistance  Equipment Recommendations  Other (comment);Hospital bed;Wheelchair cushion (measurements PT);Wheelchair (measurements PT);BSC/3in1 (If pt DCs home against recommendations, will need extensive equipment for success,. Has RW at home already)       Precautions / Restrictions Precautions Precautions: Fall Restrictions Weight Bearing Restrictions: Yes RLE Weight  Bearing: Weight bearing as tolerated     Mobility  Bed Mobility Overal bed mobility: Needs Assistance Bed Mobility: Supine to Sit;Sit to Supine     Supine to sit: Max assist;HOB elevated Sit to supine: Max assist;HOB elevated   General bed mobility comments: pt required max assistanmce of one to exit and return to/from EOB. lengthy discussion with pt's spous about concerns with planning to return home evrsus goign to rehab. " I'll be able to do it when we get there."    Transfers Overall transfer level: Needs assistance Equipment used: Rolling walker (2 wheels) Transfers: Sit to/from Stand Sit to Stand: Max assist;From elevated surface           General transfer comment: Max assist of one to stand from elevated bed height with max vcs and tcs for technique and sequencing. pt severely limited by pain in RLE + cognition deficits. Author questions if pt's spouse truely understands amount of assistance he is requiring and amount of assistance he will require at DC    Ambulation/Gait      General Gait Details: unable    Balance Overall balance assessment: Needs assistance Sitting-balance support: Bilateral upper extremity supported;Feet supported Sitting balance-Leahy Scale: Fair Sitting balance - Comments: intermittent CGA decreasting to MIN A as pt fatigues   Standing balance support: Bilateral upper extremity supported;During functional activity;Reliant on assistive device for balance Standing balance-Leahy Scale: Poor     Cognition Arousal/Alertness: Awake/alert Behavior During Therapy: Flat affect Overall Cognitive Status: History of cognitive impairments - at baseline    General Comments: pt's dementia greatly impacts session progression. He is unable to rate pain however demonstrates sign of pain throughout all movements and transfers using RLE        Exercises  General Exercises - Lower Extremity Ankle Circles/Pumps: AROM;Strengthening;Both;10 reps;Seated Long  Arc Quad: AROM;Strengthening;Both;10 reps;Seated Other Exercises Other Exercises: Pt and family educated re: OT role, DME recs, d/c recs, falls prevention, pain mgmt Other Exercises: LBD< sup<>sit, sitting balance/tolerance    General Comments General comments (skin integrity, edema, etc.): Chief Strategy Officer discussed at length with pt/pt's spouse about concerns with plan to DC home without STR first. Even with education, family is planning to take pt home at DC      Pertinent Vitals/Pain Pain Assessment: Faces Pain Score: 8  Faces Pain Scale: Hurts whole lot Pain Location: RLE Pain Descriptors / Indicators: Aching;Discomfort;Grimacing;Operative site guarding Pain Intervention(s): Limited activity within patient's tolerance;Monitored during session;Premedicated before session;Repositioned    Home Living Family/patient expects to be discharged to:: Private residence Living Arrangements: Spouse/significant other;Children (lives with adult daughter and adult granddaughter who is special needs. Spouse and adult daughter are able to assist) Available Help at Discharge: Family;Available 24 hours/day Type of Home: House Home Access: Stairs to enter Entrance Stairs-Rails: Psychiatric nurse of Steps: 3 Alternate Level Stairs-Number of Steps: Pt spouse reports pt does not go to second floor, is able to live on first level Home Layout: Two level;Able to live on main level with bedroom/bathroom Home Equipment: Flora - single point;Rolling Walker (2 wheels)          PT Goals (current goals can now be found in the care plan section) Acute Rehab PT Goals Patient Stated Goal: none stated Progress towards PT goals: Progressing toward goals    Frequency    7X/week      PT Plan Current plan remains appropriate       AM-PAC PT "6 Clicks" Mobility   Outcome Measure  Help needed turning from your back to your side while in a flat bed without using bedrails?: A Lot Help needed moving  from lying on your back to sitting on the side of a flat bed without using bedrails?: A Lot Help needed moving to and from a bed to a chair (including a wheelchair)?: A Lot Help needed standing up from a chair using your arms (e.g., wheelchair or bedside chair)?: A Lot Help needed to walk in hospital room?: Total Help needed climbing 3-5 steps with a railing? : Total 6 Click Score: 10    End of Session Equipment Utilized During Treatment: Gait belt Activity Tolerance: Patient limited by pain;Patient limited by fatigue;Other (comment) (cognition) Patient left: in bed;with nursing/sitter in room;with family/visitor present;with call bell/phone within reach Nurse Communication: Mobility status PT Visit Diagnosis: Muscle weakness (generalized) (M62.81);History of falling (Z91.81);Other abnormalities of gait and mobility (R26.89)     Time: 5883-2549 PT Time Calculation (min) (ACUTE ONLY): 28 min  Charges:  $Therapeutic Activity: 23-37 mins                     Julaine Fusi PTA 06/09/21, 5:11 PM

## 2021-06-10 ENCOUNTER — Encounter: Payer: Self-pay | Admitting: Orthopedic Surgery

## 2021-06-10 DIAGNOSIS — S72141A Displaced intertrochanteric fracture of right femur, initial encounter for closed fracture: Secondary | ICD-10-CM | POA: Diagnosis not present

## 2021-06-10 DIAGNOSIS — I10 Essential (primary) hypertension: Secondary | ICD-10-CM | POA: Diagnosis not present

## 2021-06-10 DIAGNOSIS — W19XXXA Unspecified fall, initial encounter: Secondary | ICD-10-CM | POA: Diagnosis not present

## 2021-06-10 DIAGNOSIS — D72829 Elevated white blood cell count, unspecified: Secondary | ICD-10-CM | POA: Diagnosis not present

## 2021-06-10 LAB — TYPE AND SCREEN
ABO/RH(D): A POS
Antibody Screen: NEGATIVE
Unit division: 0
Unit division: 0
Unit division: 0

## 2021-06-10 LAB — CBC
HCT: 28.3 % — ABNORMAL LOW (ref 39.0–52.0)
Hemoglobin: 9.2 g/dL — ABNORMAL LOW (ref 13.0–17.0)
MCH: 28.3 pg (ref 26.0–34.0)
MCHC: 32.5 g/dL (ref 30.0–36.0)
MCV: 87.1 fL (ref 80.0–100.0)
Platelets: 200 10*3/uL (ref 150–400)
RBC: 3.25 MIL/uL — ABNORMAL LOW (ref 4.22–5.81)
RDW: 14.7 % (ref 11.5–15.5)
WBC: 9.7 10*3/uL (ref 4.0–10.5)
nRBC: 0 % (ref 0.0–0.2)

## 2021-06-10 LAB — BPAM RBC
Blood Product Expiration Date: 202212162359
Blood Product Expiration Date: 202212212359
Blood Product Expiration Date: 202212282359
ISSUE DATE / TIME: 202211240918
ISSUE DATE / TIME: 202211241415
ISSUE DATE / TIME: 202211251049
Unit Type and Rh: 6200
Unit Type and Rh: 6200
Unit Type and Rh: 6200

## 2021-06-10 LAB — BASIC METABOLIC PANEL
Anion gap: 5 (ref 5–15)
BUN: 23 mg/dL (ref 8–23)
CO2: 25 mmol/L (ref 22–32)
Calcium: 8.3 mg/dL — ABNORMAL LOW (ref 8.9–10.3)
Chloride: 106 mmol/L (ref 98–111)
Creatinine, Ser: 0.81 mg/dL (ref 0.61–1.24)
GFR, Estimated: >60 mL/min (ref >60)
Glucose, Bld: 129 mg/dL — ABNORMAL HIGH (ref 70–99)
Potassium: 4 mmol/L (ref 3.5–5.1)
Sodium: 136 mmol/L (ref 135–145)

## 2021-06-10 LAB — ABO/RH: ABO/RH(D): A POS

## 2021-06-10 NOTE — Progress Notes (Addendum)
Physical Therapy Treatment Patient Details Name: Todd Trujillo. MRN: [id number] DOB: 1930-01-07 Today's Date: 06/10/2021   History of Present Illness Pt is a 87 y/o male with medical hx significant for dementia, hyperlipidemia, HTN, macular degeneration of the eyes, BPH, who had a mechanical fall in his home that resulted in a R femoral neck fracture. Pt is now s/p R intramedullary fixation.    PT Comments    Pt seen for PT tx with wife present for session. Pt demonstrates impaired cognition, ability to follow commands, initiation & execution of tasks & motor planning throughout session. Pt requires max assist for supine>sit with poor ability to obtain midline orientation as pt with L lateral lean in sitting. Pt requires max assist for sit>stand & stand pivot to recliner with MAX cuing & assistance to maneuver over. Pt demonstrates minimal ability to weight shift to RLE to allow advancement of LLE, but can scoot RLE across floor. Pt also requires heavy cuing during RLE strengthening exercises to continue performing them. Will continue to follow pt acutely to progress transfers & gait as able.  Addendum: Pt's wife is agreeable for him to d/c to STR. PT continues to educate pt & wife on recommendation of STR upon d/c.    Recommendations for follow up therapy are one component of a multi-disciplinary discharge planning process, led by the attending physician.  Recommendations may be updated based on patient status, additional functional criteria and insurance authorization.  Follow Up Recommendations  Skilled nursing-short term rehab (<3 hours/day)     Assistance Recommended at Discharge Frequent or constant Supervision/Assistance  Equipment Recommendations   (TBD in next venue)    Recommendations for Other Services       Precautions / Restrictions Precautions Precautions: Fall Precaution Comments: s/p intramedullary fixation for right intertrochanteric hip  fracture Restrictions Weight Bearing Restrictions: Yes RLE Weight Bearing: Weight bearing as tolerated     Mobility  Bed Mobility Overal bed mobility: Needs Assistance Bed Mobility: Supine to Sit     Supine to sit: Max assist;HOB elevated     General bed mobility comments: Max cuing & assist to move BLE to EOB & upright trunk as well as to scoot to sitting EOB.    Transfers Overall transfer level: Needs assistance Equipment used: Rolling walker (2 wheels) Transfers: Sit to/from Stand Sit to Stand: Max assist Stand pivot transfers: Max assist (Pt unable to successfully advance LLE as he is unable to weight shift to R to do so (2/2 pain). Pt requires MAX cuing for sequencing & hand placement as pt let go of RW & attempted to hold to armrest.)         General transfer comment: Max cuing for proper hand placement and assistance to power up to standing.    Ambulation/Gait                   Stairs             Wheelchair Mobility    Modified Rankin (Stroke Patients Only)       Balance Overall balance assessment: Needs assistance Sitting-balance support: Bilateral upper extremity supported;Feet supported Sitting balance-Leahy Scale: Poor Sitting balance - Comments: L lateral lean, unable to achive midline orientation without max cuing & manual facilitation from PT for extending L elbow to push trunk into midline; does not correct without assistance. Postural control: Posterior lean;Left lateral lean Standing balance support: Bilateral upper extremity supported;During functional activity;Reliant on assistive device for balance Standing balance-Leahy Scale: Zero  Standing balance comment: max assist & BUE support on RW                            Cognition Arousal/Alertness: Awake/alert Behavior During Therapy: Flat affect Overall Cognitive Status: History of cognitive impairments - at baseline                                  General Comments: Pt with very poor understanding of instructions, follows 1 step commands inconsistently with extra time, poor motor planning & execution of tasks.        Exercises General Exercises - Lower Extremity Short Arc Quad: AAROM;Strengthening;Right;10 reps Heel Slides: AAROM;Right;Strengthening;10 reps Hip ABduction/ADduction: AAROM;10 reps;Right;Strengthening    General Comments        Pertinent Vitals/Pain Pain Assessment: Faces Faces Pain Scale: Hurts a little bit Pain Location: RLE with movement Pain Descriptors / Indicators: Grimacing;Discomfort;Guarding Pain Intervention(s): Limited activity within patient's tolerance;Monitored during session;Repositioned    Home Living                          Prior Function            PT Goals (current goals can now be found in the care plan section) Acute Rehab PT Goals Patient Stated Goal: get better PT Goal Formulation: Patient unable to participate in goal setting Time For Goal Achievement: 06/22/21 Potential to Achieve Goals: Fair Progress towards PT goals: Progressing toward goals    Frequency    7X/week      PT Plan Current plan remains appropriate    Co-evaluation              AM-PAC PT "6 Clicks" Mobility   Outcome Measure  Help needed turning from your back to your side while in a flat bed without using bedrails?: A Lot Help needed moving from lying on your back to sitting on the side of a flat bed without using bedrails?: Total Help needed moving to and from a bed to a chair (including a wheelchair)?: Total Help needed standing up from a chair using your arms (e.g., wheelchair or bedside chair)?: Total Help needed to walk in hospital room?: Total Help needed climbing 3-5 steps with a railing? : Total 6 Click Score: 7    End of Session Equipment Utilized During Treatment: Gait belt Activity Tolerance: Patient tolerated treatment well Patient left: in chair;with chair alarm  set;with call bell/phone within reach;with family/visitor present Nurse Communication: Mobility status PT Visit Diagnosis: Muscle weakness (generalized) (M62.81);History of falling (Z91.81);Other abnormalities of gait and mobility (R26.89);Unsteadiness on feet (R26.81)     Time: 1610-9604 PT Time Calculation (min) (ACUTE ONLY): 23 min  Charges:  $Therapeutic Activity: 23-37 mins                     [name], PT, DPT 06/10/21, 2:38 PM    [name] 06/10/2021, 2:29 PM

## 2021-06-10 NOTE — Progress Notes (Signed)
SLP Cancellation Note  Patient Details Name: Todd Trujillo. MRN: [id number] DOB: 1929/10/02   Cancelled treatment:       Reason Eval/Treat Not Completed: SLP screened, no needs identified, will sign off  Mannie Wineland B. Rutherford Nail M.S., CCC-SLP, [place] Office [phone]  Stormy Fabian 06/10/2021, 12:32 PM

## 2021-06-10 NOTE — Progress Notes (Addendum)
Physical Therapy Treatment Patient Details Name: Todd Trujillo. MRN: [id number] DOB: 1930/06/28 Today's Date: 06/10/2021   History of Present Illness Pt is a 87 y/o male with medical hx significant for dementia, hyperlipidemia, HTN, macular degeneration of the eyes, BPH, who had a mechanical fall in his home that resulted in a R femoral neck fracture. Pt is now s/p R intramedullary fixation.    PT Comments    Pt seen for PT tx with brother present for session. Pt requires max assist to scoot out to edge of seat & max assist for sit>stand & stand pivot with RW from recliner>bed. Pt continues to demonstrate decreased ability to weight shift RLE to allow easier advancement of LLE. Pt requires mod assist for supine>sit. Pt left in bed with family present. Will continue to follow pt acutely to progress transfers & gait as able.  Addendum: PT observed old skin tear to R shin & nurse notified.    Recommendations for follow up therapy are one component of a multi-disciplinary discharge planning process, led by the attending physician.  Recommendations may be updated based on patient status, additional functional criteria and insurance authorization.  Follow Up Recommendations  Skilled nursing-short term rehab (<3 hours/day)     Assistance Recommended at Discharge Frequent or constant Supervision/Assistance  Equipment Recommendations  None recommended by PT (TBD in next venue)    Recommendations for Other Services       Precautions / Restrictions Precautions Precautions: Fall Precaution Comments: s/p intramedullary fixation for right intertrochanteric hip fracture Restrictions Weight Bearing Restrictions: Yes RLE Weight Bearing: Weight bearing as tolerated     Mobility  Bed Mobility Overal bed mobility: Needs Assistance Bed Mobility: Sit to Supine     Supine to sit: Max assist;HOB elevated Sit to supine: Mod assist;HOB elevated   General bed mobility comments:  assistance to elevate BLE onto bed, cuing & extra time to scoot up in bed.    Transfers Overall transfer level: Needs assistance Equipment used: Rolling walker (2 wheels) Transfers: Sit to/from Stand Sit to Stand: Max assist Stand pivot transfers: Max assist         General transfer comment: Pt requires max assist to scoot out to edge of chair to help increase ease of STS transfer. Pt requires cuing to position BLE feet underneath BOS & max assist to power up to standing. Pt continues to demonstrate limited ability to scoot LLE across floor & max assist for successful stand pivot to bed.    Ambulation/Gait                   Stairs             Wheelchair Mobility    Modified Rankin (Stroke Patients Only)       Balance Overall balance assessment: Needs assistance Sitting-balance support: Bilateral upper extremity supported;Feet supported Sitting balance-Leahy Scale: Fair Sitting balance - Comments: improved midline orientation but still with slight L lateral lean Postural control: Left lateral lean Standing balance support: Bilateral upper extremity supported;During functional activity;Reliant on assistive device for balance Standing balance-Leahy Scale: Zero Standing balance comment: max assist & BUE support on RW                            Cognition Arousal/Alertness: Awake/alert Behavior During Therapy: Flat affect Overall Cognitive Status: History of cognitive impairments - at baseline  General Comments: Pt with very poor understanding of instructions, follows 1 step commands inconsistently with extra time, poor motor planning & execution of tasks.        Exercises General Exercises - Lower Extremity Short Arc Quad: AAROM;Strengthening;Right;10 reps Heel Slides: AAROM;Right;Strengthening;10 reps Hip ABduction/ADduction: AAROM;10 reps;Right;Strengthening    General Comments         Pertinent Vitals/Pain Pain Assessment: Faces Faces Pain Scale: Hurts a little bit Pain Location: RLE with movement Pain Descriptors / Indicators: Grimacing;Discomfort;Guarding Pain Intervention(s): Limited activity within patient's tolerance;Monitored during session;Repositioned    Home Living                          Prior Function            PT Goals (current goals can now be found in the care plan section) Acute Rehab PT Goals Patient Stated Goal: get better PT Goal Formulation: Patient unable to participate in goal setting Time For Goal Achievement: 06/22/21 Potential to Achieve Goals: Fair Progress towards PT goals: Progressing toward goals    Frequency    7X/week      PT Plan Current plan remains appropriate    Co-evaluation              AM-PAC PT "6 Clicks" Mobility   Outcome Measure  Help needed turning from your back to your side while in a flat bed without using bedrails?: A Lot Help needed moving from lying on your back to sitting on the side of a flat bed without using bedrails?: Total Help needed moving to and from a bed to a chair (including a wheelchair)?: Total Help needed standing up from a chair using your arms (e.g., wheelchair or bedside chair)?: Total Help needed to walk in hospital room?: Total Help needed climbing 3-5 steps with a railing? : Total 6 Click Score: 7    End of Session Equipment Utilized During Treatment: Gait belt Activity Tolerance: Patient tolerated treatment well Patient left: in bed;with call bell/phone within reach;with bed alarm set;with family/visitor present Nurse Communication: Mobility status PT Visit Diagnosis: Muscle weakness (generalized) (M62.81);History of falling (Z91.81);Other abnormalities of gait and mobility (R26.89);Unsteadiness on feet (R26.81)     Time: 7564-3329 PT Time Calculation (min) (ACUTE ONLY): 12 min  Charges:  $Therapeutic Activity: 8-22 mins                      [name], PT, DPT 06/10/21, 3:03 PM    [name] 06/10/2021, 2:37 PM

## 2021-06-10 NOTE — Progress Notes (Signed)
PROGRESS NOTE  Todd Trujillo. LKG:[id number] DOB: Jul 16, 1930 DOA: 06/06/2021 PCP: Einar Pheasant, MD   LOS: 4 days   Brief Narrative / Interim history: 87 year old male with history of mild dementia, HTN, HLD, history of prostate cancer status post prostatectomy, comes into the hospital after having a mechanical fall at home, was found to have a right femoral neck fracture.  He tripped and landed on his right side.  No chest pain, syncope, loss of consciousness.  Denies hitting his head.  Orthopedic surgery consulted and he was admitted to the hospitalist service.  He is status post surgical repair 11/23 alert, remains confused.  Hard of hearing.  Subjective / 24h Interval events: Alert, confused.  No complaints.  Wife and granddaughter at bedside  Assessment & Plan: Principal Problem Mildly displaced intratrochanteric fracture of the right femoral neck-orthopedic surgery consulted, appreciate input.  He is status post IM fixation of the right intertrochanteric hip fracture on 11/23. -DVT prophylaxis postop per orthopedic surgery, changed Lovenox to aspirin due to persistent anemia -PT recommends SNF, initially wife was adamant that he would go home but now reconsidering.  Active Problems Essential hypertension-lisinopril discontinued, blood pressure better now  Acute blood loss anemia, postoperative-hemoglobin dropped as low as 6.1.  He was transfused total of 3 unit of packed red blood cells, hemoglobin much better in the 9 range today.  Continue to monitor.  Leukocytosis-due to hip fracture, likely reactive, improving today  Iron deficiency anemia-transfuse as above  History of CVA-resume aspirin.  ? BPH, history of prostate cancer status post prostatectomy-based on most recent outpatient urology notes he does not have a prostate anymore.  Continue to monitor for retention postoperatively  Mild dementia-stable, appears at baseline.  High risk of postop  delirium   Scheduled Meds:  acetaminophen  1,000 mg Oral Q6H   aspirin  324 mg Oral BID   cholecalciferol  1,000 Units Oral Daily   docusate sodium  100 mg Oral BID   feeding supplement  237 mL Oral BID BM   ferrous sulfate  325 mg Oral Q breakfast   multivitamin with minerals  1 tablet Oral Daily   pantoprazole  40 mg Oral Daily   QUEtiapine  50 mg Oral QHS   senna  1 tablet Oral BID   sertraline  25 mg Oral Daily   tamsulosin  0.4 mg Oral QPC breakfast   traMADol  50 mg Oral Q6H   Continuous Infusions:  methocarbamol (ROBAXIN) IV     promethazine (PHENERGAN) injection (IM or IVPB) 6.25 mg (06/07/21 0921)   PRN Meds:.alum & mag hydroxide-simeth, bisacodyl, HYDROcodone-acetaminophen, ketorolac, methocarbamol (ROBAXIN) IV, methocarbamol, morphine injection, ondansetron **OR** ondansetron (ZOFRAN) IV, oxyCODONE, polyethylene glycol, promethazine (PHENERGAN) injection (IM or IVPB)  Diet Orders (From admission, onward)     Start     Ordered   06/09/21 1304  Diet regular Room service appropriate? Yes; Fluid consistency: Thin  Diet effective now       Question Answer Comment  Room service appropriate? Yes   Fluid consistency: Thin      06/09/21 1303            DVT prophylaxis: SCDs Start: 06/07/21 1518 Place TED hose Start: 06/07/21 1518     Code Status: Full Code  Family Communication: Daughter present at bedside  Status is: Inpatient  Remains inpatient appropriate because: Acute blood loss anemia  Level of care: Med-Surg  Consultants:  Orthopedic surgery   Procedures:  none  Microbiology  none  Antimicrobials: none    Objective: Vitals:   06/09/21 1559 06/09/21 2105 06/10/21 0401 06/10/21 0825  BP: (!) 106/44 (!) 114/52 (!) 125/58 (!) 109/54  Pulse: 81 79 72 70  Resp: 18 16 17 18   Temp: 99.2 F (37.3 C) 98.1 F (36.7 C) 97.8 F (36.6 C) 98.3 F (36.8 C)  TempSrc:      SpO2: 96% 96% 99% 97%  Weight:      Height:        Intake/Output  Summary (Last 24 hours) at 06/10/2021 1144 Last data filed at 06/10/2021 0400 Gross per 24 hour  Intake 720 ml  Output 1000 ml  Net -280 ml    Filed Weights   06/06/21 2048 06/07/21 0106  Weight: 65 kg 69.7 kg    Examination:  Constitutional: No distress Eyes: Anicteric ENMT: mmm Neck: normal, supple Respiratory: Clear bilaterally, no wheezing heart Cardiovascular: Regular rate and rhythm, no edema Abdomen: Soft, nontender, nondistended, bowel sounds positive Musculoskeletal: no clubbing / cyanosis.  Skin: No rashes Neurologic: No focal deficits  Data Reviewed: I have independently reviewed following labs and imaging studies  CBC: Recent Labs  Lab 06/06/21 2148 06/07/21 0122 06/08/21 0400 06/08/21 1252 06/08/21 1954 06/09/21 0303 06/10/21 0917  WBC 13.5* 14.2* 11.5*  --   --  10.7* 9.7  NEUTROABS 11.2*  --   --   --   --   --   --   HGB 9.1* 9.0* 6.1* 7.3* 8.1* 7.5* 9.2*  HCT 29.2* 27.8* 19.1* 23.1* 25.2* 22.7* 28.3*  MCV 88.2 86.3 87.6  --   --  86.3 87.1  PLT 333 309 206  --   --  183 034    Basic Metabolic Panel: Recent Labs  Lab 06/06/21 2148 06/07/21 0122 06/08/21 0400 06/09/21 0303 06/10/21 0917  NA 136 136 136 134* 136  K 4.0 3.9 4.1 4.3 4.0  CL 104 104 107 107 106  CO2 25 24 25 24 25   GLUCOSE 134* 146* 136* 107* 129*  BUN 23 24* 27* 32* 23  CREATININE 0.79 0.75 1.08 0.94 0.81  CALCIUM 8.8* 8.7* 7.9* 7.9* 8.3*    Liver Function Tests: Recent Labs  Lab 06/06/21 2148 06/07/21 0122 06/08/21 0400  AST 14* 16 13*  ALT 9 8 8   ALKPHOS 96 89 72  BILITOT 0.5 0.5 0.5  PROT 6.4* 6.5 4.8*  ALBUMIN 3.4* 3.1* 2.4*    Coagulation Profile: Recent Labs  Lab 06/06/21 2148  INR 1.5*    HbA1C: No results for input(s): HGBA1C in the last 72 hours. CBG: No results for input(s): GLUCAP in the last 168 hours.  Recent Results (from the past 240 hour(s))  Resp Panel by RT-PCR (Flu A&B, Covid) Nasopharyngeal Swab     Status: None   Collection  Time: 06/06/21 10:38 PM   Specimen: Nasopharyngeal Swab; Nasopharyngeal(NP) swabs in vial transport medium  Result Value Ref Range Status   SARS Coronavirus 2 by RT PCR NEGATIVE NEGATIVE Final    Comment: (NOTE) SARS-CoV-2 target nucleic acids are NOT DETECTED.  The SARS-CoV-2 RNA is generally detectable in upper respiratory specimens during the acute phase of infection. The lowest concentration of SARS-CoV-2 viral copies this assay can detect is 138 copies/mL. A negative result does not preclude SARS-Cov-2 infection and should not be used as the sole basis for treatment or other patient management decisions. A negative result may occur with  improper specimen collection/handling, submission of specimen other than nasopharyngeal swab, presence of viral mutation(s) within  the areas targeted by this assay, and inadequate number of viral copies(<138 copies/mL). A negative result must be combined with clinical observations, patient history, and epidemiological information. The expected result is Negative.  Fact Sheet for Patients:  EntrepreneurPulse.com.au  Fact Sheet for Healthcare Providers:  IncredibleEmployment.be  This test is no t yet approved or cleared by the [place] FDA and  has been authorized for detection and/or diagnosis of SARS-CoV-2 by FDA under an Emergency Use Authorization (EUA). This EUA will remain  in effect (meaning this test can be used) for the duration of the COVID-19 declaration under Section 564(b)(1) of the Act, 21 U.S.C.section 360bbb-3(b)(1), unless the authorization is terminated  or revoked sooner.       Influenza A by PCR NEGATIVE NEGATIVE Final   Influenza B by PCR NEGATIVE NEGATIVE Final    Comment: (NOTE) The Xpert Xpress SARS-CoV-2/FLU/RSV plus assay is intended as an aid in the diagnosis of influenza from Nasopharyngeal swab specimens and should not be used as a sole basis for treatment. Nasal washings  and aspirates are unacceptable for Xpert Xpress SARS-CoV-2/FLU/RSV testing.  Fact Sheet for Patients: EntrepreneurPulse.com.au  Fact Sheet for Healthcare Providers: IncredibleEmployment.be  This test is not yet approved or cleared by the [place] FDA and has been authorized for detection and/or diagnosis of SARS-CoV-2 by FDA under an Emergency Use Authorization (EUA). This EUA will remain in effect (meaning this test can be used) for the duration of the COVID-19 declaration under Section 564(b)(1) of the Act, 21 U.S.C. section 360bbb-3(b)(1), unless the authorization is terminated or revoked.  Performed at [facility], [address] [place], [place] [address]   MRSA Next Gen by PCR, Nasal     Status: None   Collection Time: 06/07/21  1:39 AM   Specimen: Nasal Mucosa; Nasal Swab  Result Value Ref Range Status   MRSA by PCR Next Gen NOT DETECTED NOT DETECTED Final    Comment: (NOTE) The GeneXpert MRSA Assay (FDA approved for NASAL specimens only), is one component of a comprehensive MRSA colonization surveillance program. It is not intended to diagnose MRSA infection nor to guide or monitor treatment for MRSA infections. Test performance is not FDA approved in patients less than 16 years old. Performed at [facility], [address] [place], [place] [address]       Radiology Studies: No results found.   Marzetta Board, MD, PhD [facility]  Between 7 am - 7 pm I am available, please contact me via Amion (for emergencies) or Securechat (non urgent messages)  Between 7 pm - 7 am I am not available, please contact night coverage MD/APP via Amion

## 2021-06-10 NOTE — TOC Progression Note (Signed)
Transition of Care [facility]) - Progression Note    Patient Details  Name: Todd Trujillo. MRN: [id number] Date of Birth: 11/01/1929  [facility]) CM/SW Contact  Boris Sharper, LCSW Phone Number: 06/10/2021, 12:43 PM  Clinical Narrative:    Pt's family now would like SNF and requested [facility], referral with [facility] is still pending at this time. MD stated pt is not medically ready at this time.          Expected Discharge Plan and Services                                                 Social Determinants of Health (SDOH) Interventions    Readmission Risk Interventions No flowsheet data found.

## 2021-06-10 NOTE — Progress Notes (Signed)
  Subjective:  POD #3 s/p intramedullary fixation for right intertrochanteric hip fracture.   Patient reports right hip pain as mild.  Patient up in the bed to a chair.  His wife is at the bedside.  Hemoglobin is improved today.  Objective:   VITALS:   Vitals:   06/09/21 1559 06/09/21 2105 06/10/21 0401 06/10/21 0825  BP: (!) 106/44 (!) 114/52 (!) 125/58 (!) 109/54  Pulse: 81 79 72 70  Resp: 18 16 17 18   Temp: 99.2 F (37.3 C) 98.1 F (36.7 C) 97.8 F (36.6 C) 98.3 F (36.8 C)  TempSrc:      SpO2: 96% 96% 99% 97%  Weight:      Height:        PHYSICAL EXAM: Right lower extremity Neurovascular intact Sensation intact distally Intact pulses distally Dorsiflexion/Plantar flexion intact Incision: dressing C/D/I No cellulitis present Compartment soft  LABS  Results for orders placed or performed during the hospital encounter of 06/06/21 (from the past 24 hour(s))  CBC     Status: Abnormal   Collection Time: 06/10/21  9:17 AM  Result Value Ref Range   WBC 9.7 4.0 - 10.5 K/uL   RBC 3.25 (L) 4.22 - 5.81 MIL/uL   Hemoglobin 9.2 (L) 13.0 - 17.0 g/dL   HCT 28.3 (L) 39.0 - 52.0 %   MCV 87.1 80.0 - 100.0 fL   MCH 28.3 26.0 - 34.0 pg   MCHC 32.5 30.0 - 36.0 g/dL   RDW 14.7 11.5 - 15.5 %   Platelets 200 150 - 400 K/uL   nRBC 0.0 0.0 - 0.2 %  Basic metabolic panel     Status: Abnormal   Collection Time: 06/10/21  9:17 AM  Result Value Ref Range   Sodium 136 135 - 145 mmol/L   Potassium 4.0 3.5 - 5.1 mmol/L   Chloride 106 98 - 111 mmol/L   CO2 25 22 - 32 mmol/L   Glucose, Bld 129 (H) 70 - 99 mg/dL   BUN 23 8 - 23 mg/dL   Creatinine, Ser 0.81 0.61 - 1.24 mg/dL   Calcium 8.3 (L) 8.9 - 10.3 mg/dL   GFR, Estimated >60 >60 mL/min   Anion gap 5 5 - 15    No results found.  Assessment/Plan: 3 Days Post-Op   Principal Problem:   Right femoral fracture (HCC) Active Problems:   Hypercholesterolemia   Benign prostatic hyperplasia   HTN (hypertension)   GERD  (gastroesophageal reflux disease)   Cerebrovascular accident (CVA) ([place])   Leucocytosis  Patient doing well from an orthopedic standpoint.  He is making appropriate progress.  Patient may be discharged to a skilled nursing facility when he is cleared medically.  Family has expressed desire to go to [facility].  Continue physical therapy.    Thornton Park , MD 06/10/2021, 11:36 AM

## 2021-06-11 ENCOUNTER — Inpatient Hospital Stay: Payer: Medicare Other

## 2021-06-11 DIAGNOSIS — J690 Pneumonitis due to inhalation of food and vomit: Secondary | ICD-10-CM | POA: Diagnosis not present

## 2021-06-11 DIAGNOSIS — F0390 Unspecified dementia without behavioral disturbance: Secondary | ICD-10-CM

## 2021-06-11 DIAGNOSIS — I10 Essential (primary) hypertension: Secondary | ICD-10-CM | POA: Diagnosis not present

## 2021-06-11 DIAGNOSIS — S72141A Displaced intertrochanteric fracture of right femur, initial encounter for closed fracture: Secondary | ICD-10-CM | POA: Diagnosis not present

## 2021-06-11 DIAGNOSIS — W19XXXA Unspecified fall, initial encounter: Secondary | ICD-10-CM | POA: Diagnosis not present

## 2021-06-11 DIAGNOSIS — D72829 Elevated white blood cell count, unspecified: Secondary | ICD-10-CM | POA: Diagnosis not present

## 2021-06-11 DIAGNOSIS — J9601 Acute respiratory failure with hypoxia: Secondary | ICD-10-CM

## 2021-06-11 LAB — BASIC METABOLIC PANEL
Anion gap: 4 — ABNORMAL LOW (ref 5–15)
Anion gap: 7 (ref 5–15)
BUN: 26 mg/dL — ABNORMAL HIGH (ref 8–23)
BUN: 25 mg/dL — ABNORMAL HIGH (ref 8–23)
CO2: 25 mmol/L (ref 22–32)
CO2: 25 mmol/L (ref 22–32)
Calcium: 8 mg/dL — ABNORMAL LOW (ref 8.9–10.3)
Calcium: 8.6 mg/dL — ABNORMAL LOW (ref 8.9–10.3)
Chloride: 106 mmol/L (ref 98–111)
Chloride: 103 mmol/L (ref 98–111)
Creatinine, Ser: 0.68 mg/dL (ref 0.61–1.24)
Creatinine, Ser: 0.83 mg/dL (ref 0.61–1.24)
GFR, Estimated: >60 mL/min (ref >60)
GFR, Estimated: >60 mL/min (ref >60)
Glucose, Bld: 111 mg/dL — ABNORMAL HIGH (ref 70–99)
Glucose, Bld: 140 mg/dL — ABNORMAL HIGH (ref 70–99)
Potassium: 4.1 mmol/L (ref 3.5–5.1)
Potassium: 4.5 mmol/L (ref 3.5–5.1)
Sodium: 135 mmol/L (ref 135–145)
Sodium: 135 mmol/L (ref 135–145)

## 2021-06-11 LAB — CBC
HCT: 26.7 % — ABNORMAL LOW (ref 39.0–52.0)
HCT: 33.6 % — ABNORMAL LOW (ref 39.0–52.0)
Hemoglobin: 8.6 g/dL — ABNORMAL LOW (ref 13.0–17.0)
Hemoglobin: 10.8 g/dL — ABNORMAL LOW (ref 13.0–17.0)
MCH: 28.2 pg (ref 26.0–34.0)
MCH: 28.6 pg (ref 26.0–34.0)
MCHC: 32.2 g/dL (ref 30.0–36.0)
MCHC: 32.1 g/dL (ref 30.0–36.0)
MCV: 87.5 fL (ref 80.0–100.0)
MCV: 89.1 fL (ref 80.0–100.0)
Platelets: 202 10*3/uL (ref 150–400)
Platelets: 251 10*3/uL (ref 150–400)
RBC: 3.05 MIL/uL — ABNORMAL LOW (ref 4.22–5.81)
RBC: 3.77 MIL/uL — ABNORMAL LOW (ref 4.22–5.81)
RDW: 15.1 % (ref 11.5–15.5)
RDW: 14.8 % (ref 11.5–15.5)
WBC: 10.1 10*3/uL (ref 4.0–10.5)
WBC: 10 10*3/uL (ref 4.0–10.5)
nRBC: 0 % (ref 0.0–0.2)
nRBC: 0 % (ref 0.0–0.2)

## 2021-06-11 LAB — GLUCOSE, CAPILLARY: Glucose-Capillary: 112 mg/dL — ABNORMAL HIGH (ref 70–99)

## 2021-06-11 MED ORDER — SODIUM CHLORIDE 0.9 % IV SOLN
3.0000 g | Freq: Four times a day (QID) | INTRAVENOUS | Status: DC
  Administered 2021-06-11 – 2021-06-15 (×15): 3 g via INTRAVENOUS
  Filled 2021-06-11: qty 8
  Filled 2021-06-11 (×2): qty 3
  Filled 2021-06-11 (×3): qty 8
  Filled 2021-06-11 (×2): qty 3
  Filled 2021-06-11 (×2): qty 8
  Filled 2021-06-11 (×2): qty 3
  Filled 2021-06-11 (×4): qty 8
  Filled 2021-06-11 (×2): qty 3

## 2021-06-11 MED ORDER — ORAL CARE MOUTH RINSE
15.0000 mL | Freq: Two times a day (BID) | OROMUCOSAL | Status: DC
  Administered 2021-06-12 – 2021-06-15 (×6): 15 mL via OROMUCOSAL

## 2021-06-11 MED ORDER — SODIUM CHLORIDE 0.9 % IV SOLN: INTRAVENOUS | Status: DC | PRN

## 2021-06-11 MED ORDER — SODIUM CHLORIDE 0.9 % IV SOLN: INTRAVENOUS | Status: DC

## 2021-06-11 MED ORDER — FUROSEMIDE 10 MG/ML IJ SOLN
40.0000 mg | Freq: Once | INTRAMUSCULAR | Status: AC
  Administered 2021-06-11: 17:00:00 40 mg via INTRAVENOUS
  Filled 2021-06-11: qty 4

## 2021-06-11 MED ORDER — TRAMADOL HCL 50 MG PO TABS
25.0000 mg | ORAL_TABLET | Freq: Four times a day (QID) | ORAL | Status: DC
  Administered 2021-06-14 – 2021-06-15 (×5): 25 mg via ORAL
  Filled 2021-06-11 (×5): qty 1

## 2021-06-11 NOTE — Progress Notes (Signed)
Chaplain Maggie made initial visit during rapid response. Social support and prayer were central to this visit. Pt's wife and granddaughter are at bedside.Continued care available per on call chaplain.

## 2021-06-11 NOTE — Progress Notes (Signed)
Responded to rapid response approximately 1630. Patient began having alerted mental status and increased work of breathing. Portable chest xray, BMP, CBC, and POC blood glucose ordered. Initiated pulse ox monitoring, respiratory therapy initiated oxygen via venturi mask, patient responded well. Primary RN administered pain medication for comfort which improved his work of breathing and relaxed his distress.   Followed up via lab monitoring and secure chat.

## 2021-06-11 NOTE — Progress Notes (Signed)
Pharmacy Antibiotic Note  Todd Trujillo. is a 87 y.o. male with medical history significant of dementia, HLD, HTN, macular degeneration of the eye, BPH, squamous cell carcinoma of the skin who came in with right femoral neck fracture. Pharmacy has been consulted for Unasyn dosing for aspiration pneumonia.  Plan: Start Unasyn 3 g IV q6h Monitor renal function and adjust dose as clinically indicated  Height: 5\' 11"  (180.3 cm) Weight: 69.7 kg (153 lb 9.6 oz) IBW/kg (Calculated) : 75.3  Temp (24hrs), Avg:98.5 F (36.9 C), Min:98.2 F (36.8 C), Max:99.5 F (37.5 C)  Recent Labs  Lab 06/07/21 0122 06/08/21 0400 06/09/21 0303 06/10/21 0917 06/11/21 0636  WBC 14.2* 11.5* 10.7* 9.7 10.1  CREATININE 0.75 1.08 0.94 0.81 0.68    Estimated Creatinine Clearance: 61.7 mL/min (by C-G formula based on SCr of 0.68 mg/dL).    Allergies  Allergen Reactions   Codeine Nausea And Vomiting and Nausea Only    Other reaction(s): Vomiting    Antimicrobials this admission: 11/27 Unasyn >>   Microbiology results: 11/23 MRSA PCR: not detected  Thank you for allowing pharmacy to be a part of this patient's care.  Forde Dandy Judyann Casasola 06/11/2021 4:35 PM

## 2021-06-11 NOTE — TOC Progression Note (Signed)
Transition of Care [facility]) - Progression Note    Patient Details  Name: Todd Trujillo. MRN: [id number] Date of Birth: 1930-06-05  [facility]) CM/SW [place], RN Phone Number: 06/11/2021, 10:59 AM  Clinical Narrative:     Rickt the admissions director at [facility] will review on Monday No one in that role Today to review       Expected Discharge Plan and Services                                                 Social Determinants of Health (SDOH) Interventions    Readmission Risk Interventions No flowsheet data found.

## 2021-06-11 NOTE — Progress Notes (Signed)
  Subjective:  POD #4 s/p intramedullary fixation for right intertrochanteric hip fracture.   Patient appears more confused today.  Patient reports right pain as moderate.  Patient had more difficulty with physical therapy today due to fusion and possible pain.  Hemoglobin 8.6 today.  Patient's daughter is at the bedside.  Objective:   VITALS:   Vitals:   06/10/21 1624 06/10/21 2301 06/11/21 0418 06/11/21 0813  BP: (!) 104/54 (!) 99/51 (!) 115/52 (!) 115/55  Pulse: 73 69 73 82  Resp: 18 18 18 16   Temp: 97.9 F (36.6 C) 99.5 F (37.5 C) 98.3 F (36.8 C) 98.3 F (36.8 C)  TempSrc:  Oral Oral   SpO2: 99% 97% 95% 97%  Weight:      Height:        PHYSICAL EXAM: Right lower extremity Dressings are clean dry and intact.  Patient has soft compartments.  There is no erythema ecchymosis or significant swelling.  He has palpable pedal pulses, intact station light touch and intact motor function distally.  LABS  Results for orders placed or performed during the hospital encounter of 06/06/21 (from the past 24 hour(s))  Basic metabolic panel     Status: Abnormal   Collection Time: 06/11/21  6:36 AM  Result Value Ref Range   Sodium 135 135 - 145 mmol/L   Potassium 4.1 3.5 - 5.1 mmol/L   Chloride 106 98 - 111 mmol/L   CO2 25 22 - 32 mmol/L   Glucose, Bld 111 (H) 70 - 99 mg/dL   BUN 26 (H) 8 - 23 mg/dL   Creatinine, Ser 0.68 0.61 - 1.24 mg/dL   Calcium 8.0 (L) 8.9 - 10.3 mg/dL   GFR, Estimated >60 >60 mL/min   Anion gap 4 (L) 5 - 15  CBC     Status: Abnormal   Collection Time: 06/11/21  6:36 AM  Result Value Ref Range   WBC 10.1 4.0 - 10.5 K/uL   RBC 3.05 (L) 4.22 - 5.81 MIL/uL   Hemoglobin 8.6 (L) 13.0 - 17.0 g/dL   HCT 26.7 (L) 39.0 - 52.0 %   MCV 87.5 80.0 - 100.0 fL   MCH 28.2 26.0 - 34.0 pg   MCHC 32.2 30.0 - 36.0 g/dL   RDW 15.1 11.5 - 15.5 %   Platelets 202 150 - 400 K/uL   nRBC 0.0 0.0 - 0.2 %    No results found.  Assessment/Plan: 4 Days Post-Op   Principal  Problem:   Right femoral fracture (HCC) Active Problems:   Hypercholesterolemia   Benign prostatic hyperplasia   HTN (hypertension)   GERD (gastroesophageal reflux disease)   Cerebrovascular accident (CVA) ([place])   Leucocytosis  Patient's hemoglobin and hematocrit are stable.  Patient more confused today.  We will continue to monitor.  Patient will need a skilled nursing facility upon discharge.  Patient is ordered for Robaxin for the pain is consistent with spasms.  Continue current pain management otherwise.  Continue physical therapy as the patient can tolerate.  Patient is weightbearing as tolerated in the right lower extremity.  Patient on 324 mg of aspirin twice daily for DVT prophylaxis and should continue on this until follow-up in the office 2 weeks after discharge.    Thornton Park , MD 06/11/2021, 12:14 PM

## 2021-06-11 NOTE — Progress Notes (Signed)
   06/11/21 1635  Assess: MEWS Score  SpO2 100 %  O2 Device Venturi Mask  O2 Flow Rate (L/min) 15 L/min  FiO2 (%) 55 %   Resp. Interventions effective and added tele and Cont. Pulse ox.

## 2021-06-11 NOTE — Progress Notes (Signed)
Physical Therapy Treatment Patient Details Name: Todd Trujillo. MRN: [id number] DOB: 1930/07/14 Today's Date: 06/11/2021   History of Present Illness Pt is a 87 y/o male with medical hx significant for dementia, hyperlipidemia, HTN, macular degeneration of the eyes, BPH, who had a mechanical fall in his home that resulted in a R femoral neck fracture. Pt is now s/p R intramedullary fixation.    PT Comments    Pt seen for PT tx with daughter present for session. Pt appears to have slower processing today compared to yesterday as well as increased R hip pain that hinders session. Pt requires max assist +2 for supine<>sit & static sitting balance. Did attempt sit<>stand x2-3 times with pt able to come to standing but with strong trunk lean onto RW and inability to upright trunk. Pt unable to advance feet to even attempt moving to chair so assisted pt back to bed & positioned in chair position.    Recommendations for follow up therapy are one component of a multi-disciplinary discharge planning process, led by the attending physician.  Recommendations may be updated based on patient status, additional functional criteria and insurance authorization.  Follow Up Recommendations  Skilled nursing-short term rehab (<3 hours/day)     Assistance Recommended at Discharge Frequent or constant Supervision/Assistance  Equipment Recommendations  None recommended by PT    Recommendations for Other Services       Precautions / Restrictions Precautions Precautions: Fall Precaution Comments: s/p intramedullary fixation for right intertrochanteric hip fracture Restrictions Weight Bearing Restrictions: Yes RLE Weight Bearing: Weight bearing as tolerated LLE Weight Bearing: Weight bearing as tolerated     Mobility  Bed Mobility Overal bed mobility: Needs Assistance Bed Mobility: Supine to Sit;Sit to Supine     Supine to sit: Max assist;HOB elevated;+2 for physical assistance Sit to  supine: Max assist;HOB elevated;+2 for physical assistance   General bed mobility comments: Daughter willingly assisted with bed mobility throughout session. Pt requires max assist to move BLE to EOB & to upright trunk, as well as to scoot to EOB.    Transfers Overall transfer level: Needs assistance Equipment used: Rolling walker (2 wheels) Transfers: Sit to/from Stand Sit to Stand: Max assist;+2 physical assistance (daughter assisting)           General transfer comment: Pt with poor understanding of safe hand placement & BLE placement for stable BOS. Pt with L lateral lean that impairs his ability to transfer. PT provides max assist for anterior weight shifting & to power up with pt able to weight bear through BLE somewhat.    Ambulation/Gait                   Stairs             Wheelchair Mobility    Modified Rankin (Stroke Patients Only)       Balance Overall balance assessment: Needs assistance Sitting-balance support: Bilateral upper extremity supported;Feet supported Sitting balance-Leahy Scale: Zero Sitting balance - Comments: L lateral lean with PT providing max multimodal cuing for correcting midline orienation but pt continues to lean to L to offload R hip. Postural control: Left lateral lean   Standing balance-Leahy Scale: Zero                              Cognition Arousal/Alertness: Awake/alert Behavior During Therapy: Flat affect Overall Cognitive Status: History of cognitive impairments - at baseline  General Comments: More delayed processing compared to yesterday - daughter reports there were several interruptions throughout the night & pt did not get a lot of sleep.        Exercises General Exercises - Lower Extremity Heel Slides: AAROM (PT attempts to provide AAROM but pt continues to say "ow ow!" so activity deferred)    General Comments        Pertinent Vitals/Pain  Pain Assessment: Faces Faces Pain Scale: Hurts whole lot Pain Location: R hip/LE Pain Descriptors / Indicators: Grimacing;Discomfort;Guarding (Pt leans to L to prevent putting weight through L hip/side of body) Pain Intervention(s): Repositioned;Monitored during session    Home Living                          Prior Function            PT Goals (current goals can now be found in the care plan section) Acute Rehab PT Goals Patient Stated Goal: go home PT Goal Formulation: With patient/family Time For Goal Achievement: 06/22/21 Potential to Achieve Goals: Fair Progress towards PT goals: Progressing toward goals    Frequency    7X/week      PT Plan Current plan remains appropriate    Co-evaluation              AM-PAC PT "6 Clicks" Mobility   Outcome Measure  Help needed turning from your back to your side while in a flat bed without using bedrails?: Total Help needed moving from lying on your back to sitting on the side of a flat bed without using bedrails?: Total Help needed moving to and from a bed to a chair (including a wheelchair)?: Total Help needed standing up from a chair using your arms (e.g., wheelchair or bedside chair)?: Total Help needed to walk in hospital room?: Total Help needed climbing 3-5 steps with a railing? : Total 6 Click Score: 6    End of Session Equipment Utilized During Treatment: Gait belt Activity Tolerance: Patient limited by pain Patient left: in bed;with bed alarm set;with call bell/phone within reach;with family/visitor present (in chair position) Nurse Communication: Mobility status (pain) PT Visit Diagnosis: Muscle weakness (generalized) (M62.81);History of falling (Z91.81);Other abnormalities of gait and mobility (R26.89);Unsteadiness on feet (R26.81)     Time: 3094-0768 PT Time Calculation (min) (ACUTE ONLY): 18 min  Charges:  $Therapeutic Activity: 8-22 mins                     [name], PT,  DPT 06/11/21, 12:01 PM    [name] 06/11/2021, 11:59 AM

## 2021-06-11 NOTE — Evaluation (Signed)
Clinical/Bedside Swallow Evaluation Patient Details  Name: Todd Trujillo. MRN: [id number] Date of Birth: [birth year]  Today's Date: 06/11/2021 Time: SLP Start Time (ACUTE ONLY): 1205 SLP Stop Time (ACUTE ONLY): 1235 SLP Time Calculation (min) (ACUTE ONLY): 30 min  Past Medical History:  Past Medical History:  Diagnosis Date   Arthritis    Cancer ([place])    prostate   Hyperlipidemia    Hypertension    Left wrist fracture    Macular degeneration of right eye    Nephrolithiasis    Prostate CA ([place]) 2003   Prostate troubles    Patient was unsure of term   Squamous cell carcinoma of skin 01/23/2017   R distal lat bicep near elbow   Past Surgical History:  Past Surgical History:  Procedure Laterality Date   INTRAMEDULLARY (IM) NAIL INTERTROCHANTERIC Right 06/07/2021   Procedure: INTRAMEDULLARY (IM) NAIL INTERTROCHANTRIC;  Surgeon: Thornton Park, MD;  Location: [facility] ORS;  Service: Orthopedics;  Laterality: Right;   TOTAL KNEE ARTHROPLASTY     HPI:  Pt is a 87 y/o male with medical hx significant for dementia, hyperlipidemia, HTN, macular degeneration of the eyes, BPH, who had a mechanical fall in his home that resulted in a R femoral neck fracture. Pt is now s/p R intramedullary fixation. MRI revealed chronic atrophic and ischemic changes stable from the prior study.    Assessment / Plan / Recommendation  Clinical Impression  Pt seen for clinical swallowing evaluation. Pt alert. Perseverative verbal output, "oh." Pt's daughter feeding pt upon SLP entrance to room. Daughter noted pt consumed a regular diet PTA; however, pt with "more trouble swallowing" the last 2 days and pt's swallow functioning being "at its worst" at present. Daughter noted pt typically able to feed self, but unable to self-feed today given AMS.   Observed pt with consistencies from meal tray (soft solid, pureed, thin liquids) as well as nectar-thick liquids. Of note, pt with wet vocal quality upon SLP  entrance to room. Pt with s/sx moderate oral dysphagia c/b prolonged mastication with solids, oral holding/delayed A-P transit across all consistencies, and pocketing of solids and purees in L lateral sulcus. Pt with s/sx concerning for highly suspected pharyngeal dysphagia including seemingly delayed swallow initiation to palpation, wet vocal quality and changes to respiration following POs with almost stridorous quality, and immediate coughing. Pt's s/sx were present across all consistencies and changes to respirations and vocal quality appeared to increased during course of evaluation. Efforts to reduce oral residual with liquid wash were effective; however, attributed to further s/sx pharyngeal dysphagia.  Pt is at increased risk for aspiration/aspiration PNA given hx of CVA, current clinical presentation (including ?neuro changes), dependence for feeding, mental status, multiple medical comorbidities, and dental status.  At present, a safe oral diet cannot be recommended at this time given pt's overall clinical presentation.  RN present for end of evaluation and agreed with SLP recommendations for NPO. RN messaged MD re: SLP recommendations. Noted pt with CXR and MRI ordered after completion of SLP evaluation.   Pt and daughter also made aware of role of SLP, diet recommendations, safe swallowing strategies/aspiration precautions, and SLP POC. Likely reduced understanding by pt. Daughter verbalized understanding/agreement.   SLP to f/u per POC for clinical swallowing re-evaluation, as appropriate.   SLP Visit Diagnosis: Dysphagia, oropharyngeal phase (R13.12)    Aspiration Risk  Moderate aspiration risk;Risk for inadequate nutrition/hydration    Diet Recommendation NPO;Alternative means - temporary   Medication Administration: Via alternative means  Other  Recommendations Oral Care Recommendations: Oral care QID Other Recommendations: Remove water pitcher;Have oral suction available     Recommendations for follow up therapy are one component of a multi-disciplinary discharge planning process, led by the attending physician.  Recommendations may be updated based on patient status, additional functional criteria and insurance authorization.  Follow up Recommendations Skilled nursing-short term rehab (<3 hours/day)      Assistance Recommended at Discharge Frequent or constant Supervision/Assistance  Functional Status Assessment Patient has had a recent decline in their functional status and demonstrates the ability to make significant improvements in function in a reasonable and predictable amount of time.  Frequency and Duration min 2x/week  2 weeks       Prognosis Prognosis for Safe Diet Advancement: Fair Barriers to Reach Goals: Cognitive deficits;Severity of deficits      Swallow Study   General Date of Onset: 06/06/21 HPI: Pt is a 87 y/o male with medical hx significant for dementia, hyperlipidemia, HTN, macular degeneration of the eyes, BPH, who had a mechanical fall in his home that resulted in a R femoral neck fracture. Pt is now s/p R intramedullary fixation. MRI revealed chronic atrophic and ischemic changes stable from the prior study. Type of Study: Bedside Swallow Evaluation Diet Prior to this Study: Thin liquids;Regular Temperature Spikes Noted: No Respiratory Status: Room air Behavior/Cognition: Alert;Requires cueing;Confused Oral Cavity Assessment: Within Functional Limits Oral Care Completed by SLP: No (consuming lunch upon SLP entrance to room) Oral Cavity - Dentition:  (wears dentures) Vision:  (unable to feed self; unknown) Self-Feeding Abilities: Total assist Patient Positioning: Upright in bed Baseline Vocal Quality: Wet Volitional Cough: Weak Volitional Swallow: Unable to elicit    Oral/Motor/Sensory Function Overall Oral Motor/Sensory Function:  (unable to assess; ?oral motor deficits on L given pocketing)   Thin Liquid Thin Liquid:  Impaired Presentation: Cup Oral Phase Impairments: Poor awareness of bolus Oral Phase Functional Implications: Oral holding Pharyngeal  Phase Impairments: Wet Vocal Quality;Cough - Immediate;Suspected delayed Swallow  ~1 oz   Nectar Thick Nectar Thick Liquid: Impaired Presentation: Cup Oral Phase Impairments: Poor awareness of bolus Oral phase functional implications: Oral holding Pharyngeal Phase Impairments: Wet Vocal Quality;Cough - Immediate;Suspected delayed Swallow  ~2 oz   Puree Puree: Impaired Presentation: Spoon Oral Phase Impairments: Poor awareness of bolus;Reduced lingual movement/coordination Oral Phase Functional Implications: Left lateral sulci pocketing;Oral residue;Oral holding Pharyngeal Phase Impairments: Suspected delayed Swallow;Wet Vocal Quality;Cough - Immediate  ~4 teaspoons   Solid     Solid: Impaired Oral Phase Impairments: Impaired mastication Oral Phase Functional Implications: Prolonged oral transit;Impaired mastication;Left lateral sulci pocketing;Oral residue;Oral holding Pharyngeal Phase Impairments: Suspected delayed Swallow;Wet Vocal Quality;Cough - Immediate  2 teaspoons    Cherrie Gauze, M.S., [facility] [phone] ([place])  Todd Trujillo 06/11/2021,2:57 PM

## 2021-06-11 NOTE — Progress Notes (Signed)
   06/11/21 1606  Assess: MEWS Score  Temp 98.2 F (36.8 C)  BP (!) 167/85  Pulse Rate (!) 114  Resp 20  SpO2 (!) 80 % (RN Faline Langer notified NT N.Foy)  O2 Device Nasal Cannula  Assess: MEWS Score  MEWS Temp 0  MEWS Systolic 0  MEWS Pulse 2  MEWS RR 0  MEWS LOC 0  MEWS Score 2  MEWS Score Color Yellow  Assess: if the MEWS score is Yellow or Red  Were vital signs taken at a resting state? Yes  Focused Assessment Change from prior assessment (see assessment flowsheet)  Does the patient meet 2 or more of the SIRS criteria? No  MEWS guidelines implemented *See Row Information* Yes  Treat  MEWS Interventions Escalated (See documentation below) (Rapid)  Pain Scale PAINAD  Pain Score 7  Pain Type Surgical pain  Pain Location Hip  Pain Orientation Right  Escalate  MEWS: Escalate Yellow: discuss with charge nurse/RN and consider discussing with provider and RRT (Rapid)  Notify: Charge Nurse/RN  Name of Charge Nurse/RN Notified Estill Bamberg, RN  Date Charge Nurse/RN Notified 06/11/21  Time Charge Nurse/RN Notified [id number]  Notify: Provider  Provider Name/Title Cruzita Lederer, MD  Date Provider Notified 06/11/21  Time Provider Notified 1607  Notification Type Call  Notification Reason Critical result  Provider response At bedside  Date of Provider Response 06/11/21  Time of Provider Response 1625  Document  Patient Outcome Stabilized after interventions  Progress note created (see row info) Yes  Assess: SIRS CRITERIA  SIRS Temperature  0  SIRS Pulse 1  SIRS Respirations  0  SIRS WBC 0  SIRS Score Sum  1

## 2021-06-11 NOTE — Progress Notes (Addendum)
PROGRESS NOTE  Todd Trujillo. HNG:[id number] DOB: 10-25-1929 DOA: 06/06/2021 PCP: Einar Pheasant, MD   LOS: 5 days   Brief Narrative / Interim history: 87 year old male with history of mild dementia, HTN, HLD, history of prostate cancer status post prostatectomy, comes into the hospital after having a mechanical fall at home, was found to have a right femoral neck fracture.  He tripped and landed on his right side.  No chest pain, syncope, loss of consciousness.  Denies hitting his head.  Orthopedic surgery consulted and he was admitted to the hospitalist service.  He is status post surgical repair 11/23 alert, remains confused.  Hard of hearing.  Subjective / 24h Interval events: Eating breakfast.  Daughter is at bedside  Assessment & Plan: Principal Problem Mildly displaced intratrochanteric fracture of the right femoral neck-orthopedic surgery consulted, appreciate input.  He is status post IM fixation of the right intertrochanteric hip fracture on 11/23. -DVT prophylaxis postop per orthopedic surgery, changed Lovenox to aspirin due to persistent anemia -PT recommends SNF, initially wife was adamant that he would go home but now reconsidering.  Patient placement pending  Active Problems Essential hypertension-hypertensive postoperatively, lisinopril discontinued, blood pressure better now  Acute blood loss anemia, postoperative-hemoglobin dropped as low as 6.1.  He was transfused total of 3 unit of packed red blood cells.  Hemoglobin better but downtrending again today.  Continue to monitor.  No bleeding  Leukocytosis-due to hip fracture, likely reactive, improving today  Iron deficiency anemia-transfuse as above  History of CVA-resume aspirin.  ? BPH, history of prostate cancer status post prostatectomy-based on most recent outpatient urology notes he does not have a prostate anymore.  Continue to monitor for retention postoperatively  Mild dementia-stable, appears  at baseline.  High risk of postop delirium  Addendum, 4:45 pm - rapid response was called on the patient because of tachypnea and increased respiratory effort, shortness of breath.  Evaluated patient at bedside, has bibasilar rhonchi and increased work of breathing.  He has a nonrebreather on.  Chest x-ray done earlier showed left lower lobe infiltrate, he likely aspirated and has aspiration pneumonia.  Antibiotics already ordered by the time of the rapid response but he has not gotten them yet.  Also stop fluids, give IV Lasix x1.  Extensive goals of care discussed with the wife at bedside and for now she wishes for the patient to be full code, intubated if needed.  Later on I rediscussed this with the patient's daughter Marliss Czar, and she is discussed with her mother that he should not be resuscitated if his heart stops.  Daughter, however, wishes, the same as her mother for patient to be intubated in case that is needed  Additional time spent with the patient/family 45 minutes  Scheduled Meds:  aspirin  324 mg Oral BID   cholecalciferol  1,000 Units Oral Daily   docusate sodium  100 mg Oral BID   feeding supplement  237 mL Oral BID BM   ferrous sulfate  325 mg Oral Q breakfast   multivitamin with minerals  1 tablet Oral Daily   pantoprazole  40 mg Oral Daily   QUEtiapine  50 mg Oral QHS   senna  1 tablet Oral BID   sertraline  25 mg Oral Daily   tamsulosin  0.4 mg Oral QPC breakfast   traMADol  50 mg Oral Q6H   Continuous Infusions:  methocarbamol (ROBAXIN) IV     promethazine (PHENERGAN) injection (IM or IVPB) 6.25 mg (06/07/21 0921)  PRN Meds:.alum & mag hydroxide-simeth, bisacodyl, HYDROcodone-acetaminophen, ketorolac, methocarbamol (ROBAXIN) IV, methocarbamol, morphine injection, ondansetron **OR** ondansetron (ZOFRAN) IV, oxyCODONE, polyethylene glycol, promethazine (PHENERGAN) injection (IM or IVPB)  Diet Orders (From admission, onward)     Start     Ordered   06/09/21 1304  Diet  regular Room service appropriate? Yes; Fluid consistency: Thin  Diet effective now       Question Answer Comment  Room service appropriate? Yes   Fluid consistency: Thin      06/09/21 1303            DVT prophylaxis: SCDs Start: 06/07/21 1518 Place TED hose Start: 06/07/21 1518     Code Status: Full Code  Family Communication: Daughter present at bedside  Status is: Inpatient  Remains inpatient appropriate because: Acute blood loss anemia  Level of care: Med-Surg  Consultants:  Orthopedic surgery   Procedures:  none  Microbiology  none  Antimicrobials: none    Objective: Vitals:   06/10/21 1624 06/10/21 2301 06/11/21 0418 06/11/21 0813  BP: (!) 104/54 (!) 99/51 (!) 115/52 (!) 115/55  Pulse: 73 69 73 82  Resp: 18 18 18 16   Temp: 97.9 F (36.6 C) 99.5 F (37.5 C) 98.3 F (36.8 C) 98.3 F (36.8 C)  TempSrc:  Oral Oral   SpO2: 99% 97% 95% 97%  Weight:      Height:        Intake/Output Summary (Last 24 hours) at 06/11/2021 1051 Last data filed at 06/10/2021 2300 Gross per 24 hour  Intake --  Output 500 ml  Net -500 ml    Filed Weights   06/06/21 2048 06/07/21 0106  Weight: 65 kg 69.7 kg    Examination:  Constitutional: NAD Eyes: No scleral icterus ENMT: Moist mucous membranes Neck: normal, supple Respiratory: Clear bilaterally, no wheezing Cardiovascular: Regular rate and rhythm, no edema Abdomen: Soft, NT, ND, bowel sounds positive Musculoskeletal: no clubbing / cyanosis.  Skin: No rashes seen Neurologic: Nonfocal, equal strength  Data Reviewed: I have independently reviewed following labs and imaging studies  CBC: Recent Labs  Lab 06/06/21 2148 06/07/21 0122 06/08/21 0400 06/08/21 1252 06/08/21 1954 06/09/21 0303 06/10/21 0917 06/11/21 0636  WBC 13.5* 14.2* 11.5*  --   --  10.7* 9.7 10.1  NEUTROABS 11.2*  --   --   --   --   --   --   --   HGB 9.1* 9.0* 6.1* 7.3* 8.1* 7.5* 9.2* 8.6*  HCT 29.2* 27.8* 19.1* 23.1* 25.2*  22.7* 28.3* 26.7*  MCV 88.2 86.3 87.6  --   --  86.3 87.1 87.5  PLT 333 309 206  --   --  183 200 876    Basic Metabolic Panel: Recent Labs  Lab 06/07/21 0122 06/08/21 0400 06/09/21 0303 06/10/21 0917 06/11/21 0636  NA 136 136 134* 136 135  K 3.9 4.1 4.3 4.0 4.1  CL 104 107 107 106 106  CO2 24 25 24 25 25   GLUCOSE 146* 136* 107* 129* 111*  BUN 24* 27* 32* 23 26*  CREATININE 0.75 1.08 0.94 0.81 0.68  CALCIUM 8.7* 7.9* 7.9* 8.3* 8.0*    Liver Function Tests: Recent Labs  Lab 06/06/21 2148 06/07/21 0122 06/08/21 0400  AST 14* 16 13*  ALT 9 8 8   ALKPHOS 96 89 72  BILITOT 0.5 0.5 0.5  PROT 6.4* 6.5 4.8*  ALBUMIN 3.4* 3.1* 2.4*    Coagulation Profile: Recent Labs  Lab 06/06/21 2148  INR 1.5*  HbA1C: No results for input(s): HGBA1C in the last 72 hours. CBG: No results for input(s): GLUCAP in the last 168 hours.  Recent Results (from the past 240 hour(s))  Resp Panel by RT-PCR (Flu A&B, Covid) Nasopharyngeal Swab     Status: None   Collection Time: 06/06/21 10:38 PM   Specimen: Nasopharyngeal Swab; Nasopharyngeal(NP) swabs in vial transport medium  Result Value Ref Range Status   SARS Coronavirus 2 by RT PCR NEGATIVE NEGATIVE Final    Comment: (NOTE) SARS-CoV-2 target nucleic acids are NOT DETECTED.  The SARS-CoV-2 RNA is generally detectable in upper respiratory specimens during the acute phase of infection. The lowest concentration of SARS-CoV-2 viral copies this assay can detect is 138 copies/mL. A negative result does not preclude SARS-Cov-2 infection and should not be used as the sole basis for treatment or other patient management decisions. A negative result may occur with  improper specimen collection/handling, submission of specimen other than nasopharyngeal swab, presence of viral mutation(s) within the areas targeted by this assay, and inadequate number of viral copies(<138 copies/mL). A negative result must be combined with clinical  observations, patient history, and epidemiological information. The expected result is Negative.  Fact Sheet for Patients:  EntrepreneurPulse.com.au  Fact Sheet for Healthcare Providers:  IncredibleEmployment.be  This test is no t yet approved or cleared by the [place] FDA and  has been authorized for detection and/or diagnosis of SARS-CoV-2 by FDA under an Emergency Use Authorization (EUA). This EUA will remain  in effect (meaning this test can be used) for the duration of the COVID-19 declaration under Section 564(b)(1) of the Act, 21 U.S.C.section 360bbb-3(b)(1), unless the authorization is terminated  or revoked sooner.       Influenza A by PCR NEGATIVE NEGATIVE Final   Influenza B by PCR NEGATIVE NEGATIVE Final    Comment: (NOTE) The Xpert Xpress SARS-CoV-2/FLU/RSV plus assay is intended as an aid in the diagnosis of influenza from Nasopharyngeal swab specimens and should not be used as a sole basis for treatment. Nasal washings and aspirates are unacceptable for Xpert Xpress SARS-CoV-2/FLU/RSV testing.  Fact Sheet for Patients: EntrepreneurPulse.com.au  Fact Sheet for Healthcare Providers: IncredibleEmployment.be  This test is not yet approved or cleared by the [place] FDA and has been authorized for detection and/or diagnosis of SARS-CoV-2 by FDA under an Emergency Use Authorization (EUA). This EUA will remain in effect (meaning this test can be used) for the duration of the COVID-19 declaration under Section 564(b)(1) of the Act, 21 U.S.C. section 360bbb-3(b)(1), unless the authorization is terminated or revoked.  Performed at [facility], [address] [place], [place] [address]   MRSA Next Gen by PCR, Nasal     Status: None   Collection Time: 06/07/21  1:39 AM   Specimen: Nasal Mucosa; Nasal Swab  Result Value Ref Range Status   MRSA by PCR Next Gen NOT DETECTED  NOT DETECTED Final    Comment: (NOTE) The GeneXpert MRSA Assay (FDA approved for NASAL specimens only), is one component of a comprehensive MRSA colonization surveillance program. It is not intended to diagnose MRSA infection nor to guide or monitor treatment for MRSA infections. Test performance is not FDA approved in patients less than 89 years old. Performed at [facility], [address], [place], [place] [address]       Radiology Studies: No results found.   Marzetta Board, MD, PhD [facility]  Between 7 am - 7 pm I am available, please contact me  via Amion (for emergencies) or Securechat (non urgent messages)  Between 7 pm - 7 am I am not available, please contact night coverage MD/APP via Amion

## 2021-06-12 ENCOUNTER — Inpatient Hospital Stay: Payer: Medicare Other

## 2021-06-12 DIAGNOSIS — D72829 Elevated white blood cell count, unspecified: Secondary | ICD-10-CM | POA: Diagnosis not present

## 2021-06-12 DIAGNOSIS — W19XXXA Unspecified fall, initial encounter: Secondary | ICD-10-CM | POA: Diagnosis not present

## 2021-06-12 DIAGNOSIS — S72141A Displaced intertrochanteric fracture of right femur, initial encounter for closed fracture: Secondary | ICD-10-CM | POA: Diagnosis not present

## 2021-06-12 DIAGNOSIS — I10 Essential (primary) hypertension: Secondary | ICD-10-CM | POA: Diagnosis not present

## 2021-06-12 LAB — BASIC METABOLIC PANEL
Anion gap: 8 (ref 5–15)
BUN: 32 mg/dL — ABNORMAL HIGH (ref 8–23)
CO2: 24 mmol/L (ref 22–32)
Calcium: 8.4 mg/dL — ABNORMAL LOW (ref 8.9–10.3)
Chloride: 105 mmol/L (ref 98–111)
Creatinine, Ser: 1.04 mg/dL (ref 0.61–1.24)
GFR, Estimated: >60 mL/min (ref >60)
Glucose, Bld: 149 mg/dL — ABNORMAL HIGH (ref 70–99)
Potassium: 4.3 mmol/L (ref 3.5–5.1)
Sodium: 137 mmol/L (ref 135–145)

## 2021-06-12 LAB — CBC
HCT: 29.6 % — ABNORMAL LOW (ref 39.0–52.0)
Hemoglobin: 9.7 g/dL — ABNORMAL LOW (ref 13.0–17.0)
MCH: 28.7 pg (ref 26.0–34.0)
MCHC: 32.8 g/dL (ref 30.0–36.0)
MCV: 87.6 fL (ref 80.0–100.0)
Platelets: 259 10*3/uL (ref 150–400)
RBC: 3.38 MIL/uL — ABNORMAL LOW (ref 4.22–5.81)
RDW: 15 % (ref 11.5–15.5)
WBC: 15 10*3/uL — ABNORMAL HIGH (ref 4.0–10.5)
nRBC: 0 % (ref 0.0–0.2)

## 2021-06-12 MED ORDER — PANTOPRAZOLE SODIUM 40 MG IV SOLR
40.0000 mg | Freq: Every day | INTRAVENOUS | Status: DC
Start: 2021-06-12 — End: 2021-06-15
  Administered 2021-06-12 – 2021-06-15 (×4): 40 mg via INTRAVENOUS
  Filled 2021-06-12 (×4): qty 40

## 2021-06-12 NOTE — Progress Notes (Signed)
PT Cancellation Note  Patient Details Name: Todd Trujillo. MRN: [id number] DOB: 13-Dec-1929   Cancelled Treatment:    Reason Eval/Treat Not Completed: Medical issues which prohibited therapy Spoke with MD via secure chat who requests to hold therapy on this date as pt's MRI shows SDH. Will f/u as able & as pt is appropriate for PT intervention.  [name], PT, DPT 06/12/21, 2:39 PM    [name] 06/12/2021, 2:38 PM

## 2021-06-12 NOTE — Progress Notes (Signed)
PT Cancellation Note  Patient Details Name: Todd Trujillo. MRN: [id number] DOB: 03/06/1930   Cancelled Treatment:    Reason Eval/Treat Not Completed: Medical issues which prohibited therapy Chart reviewed & pt noted to have rapid response called last night due to mental status changes & increased work of breathing. Chest x-ray showed New airspace opacity the right mid to lower lung consistent with aspiration pneumonia/pneumonitis. Pt also with pending MRI. Consulted MD & will hold AM PT session with plans to f/u in PM due to medical status.  [name], PT, DPT 06/12/21, 8:41 AM    [name] 06/12/2021, 8:39 AM

## 2021-06-12 NOTE — Progress Notes (Signed)
Patient off the floor currently.

## 2021-06-12 NOTE — TOC Progression Note (Addendum)
Transition of Care [facility]) - Progression Note    Patient Details  Name: Todd Trujillo. MRN: [id number] Date of Birth: 01-21-1930  Transition of Care [facility]) CM/SW [place], RN Phone Number: 06/12/2021, 9:07 AM  Clinical Narrative:    Reached out to [facility] and asked Ricky to review for a bed    Ricky called and made the bed offer, The patient has declined in health, has an MRI scheduled for today, Palliative to speak with the family about goals of care,     Expected Discharge Plan and Services                                                 Social Determinants of Health (SDOH) Interventions    Readmission Risk Interventions No flowsheet data found.

## 2021-06-12 NOTE — Progress Notes (Signed)
Subjective:  POD #5 s/p intramedullary fixation for right intertrochanteric hip fracture.   Patient reports right hip pain as mild.  Patient indicates his pain is improved since yesterday.  His daughter is at the bedside.  Patient had a rapid response called yesterday at 4:30 PM.  He was noted to have mental status changes and increased work of breathing.  There was concern that the patient may have aspirated.  An MRI and CT scan of the brain were performed today.  These studies shows a small subdural collections without mass-effect on the brain..  Objective:   VITALS:   Vitals:   06/12/21 1600 06/12/21 1700 06/12/21 1800 06/12/21 2032  BP:  119/62  (!) 111/53  Pulse:  96  95  Resp:  (!) 22  18  Temp:  97.9 F (36.6 C)  97.7 F (36.5 C)  TempSrc:      SpO2: 95%  95% 97%  Weight:      Height:        PHYSICAL EXAM: Right lower extremity Neurovascular intact Sensation intact distally Intact pulses distally Dorsiflexion/Plantar flexion intact Superior dressing with scant serous drainage No cellulitis present Compartments soft  LABS  Results for orders placed or performed during the hospital encounter of 06/06/21 (from the past 24 hour(s))  CBC     Status: Abnormal   Collection Time: 06/12/21  6:22 AM  Result Value Ref Range   WBC 15.0 (H) 4.0 - 10.5 K/uL   RBC 3.38 (L) 4.22 - 5.81 MIL/uL   Hemoglobin 9.7 (L) 13.0 - 17.0 g/dL   HCT 29.6 (L) 39.0 - 52.0 %   MCV 87.6 80.0 - 100.0 fL   MCH 28.7 26.0 - 34.0 pg   MCHC 32.8 30.0 - 36.0 g/dL   RDW 15.0 11.5 - 15.5 %   Platelets 259 150 - 400 K/uL   nRBC 0.0 0.0 - 0.2 %  Basic metabolic panel     Status: Abnormal   Collection Time: 06/12/21  6:22 AM  Result Value Ref Range   Sodium 137 135 - 145 mmol/L   Potassium 4.3 3.5 - 5.1 mmol/L   Chloride 105 98 - 111 mmol/L   CO2 24 22 - 32 mmol/L   Glucose, Bld 149 (H) 70 - 99 mg/dL   BUN 32 (H) 8 - 23 mg/dL   Creatinine, Ser 1.04 0.61 - 1.24 mg/dL   Calcium 8.4 (L) 8.9 - 10.3  mg/dL   GFR, Estimated >60 >60 mL/min   Anion gap 8 5 - 15    CT HEAD WO CONTRAST (5MM)  Result Date: 06/12/2021 CLINICAL DATA:  Mental status change. Cerebral hemorrhage suspected. EXAM: CT HEAD WITHOUT CONTRAST TECHNIQUE: Contiguous axial images were obtained from the base of the skull through the vertex without intravenous contrast. COMPARISON:  MR head with contrast 06/12/2021 FINDINGS: Brain: Moderate atrophy and white matter changes are again seen. No acute parenchymal infarct or hemorrhage is present. The ventricles are proportionate to the degree of atrophy. Thin extra-axial collections are again noted. Collections measure a 3-4 mm on coronal imaging. No acute hyperdense blood is present. Hazy densities are present bilaterally. No significant mass effect or midline shift is present. Vascular: Atherosclerotic calcifications are again noted within the cavernous internal carotid arteries bilaterally. No hyperdense vessel present. Skull: Calvarium is intact. No focal lytic or blastic lesions are present. No significant extracranial soft tissue lesion is present. Sinuses/Orbits: The paranasal sinuses and mastoid air cells are clear. The globes and orbits are within  normal limits. IMPRESSION: 1. Stable subacute subdural hematomas bilaterally without significant mass effect or midline shift. No hyperdense blood products are present. 2. Stable atrophy and white matter disease. 3. No other acute intracranial abnormality or significant interval change. No focal parenchymal changes from the prior studies. Electronically Signed   By: San Morelle M.D.   On: 06/12/2021 15:01   MR BRAIN WO CONTRAST  Result Date: 06/12/2021 CLINICAL DATA:  Mental status change, unknown cause. Additional history provided: Mental status change last night status post hip surgery. EXAM: MRI HEAD WITHOUT CONTRAST TECHNIQUE: Multiplanar, multiecho pulse sequences of the brain and surrounding structures were obtained without  intravenous contrast. COMPARISON:  Prior head CT examinations 06/06/2021 and earlier. Brain MRI 09/12/2019. FINDINGS: Brain: Moderate cerebral atrophy. Comparatively mild cerebellar atrophy. There are small subdural collections overlying the bilateral cerebral hemispheres, measuring up to 4 mm in thickness on the right, and 5 mm in thickness on the left. The subdural collections are mildly hyperintense relative to CSF on the axial T2 FLAIR sequence. There are small scattered foci of SWI signal loss along the bilateral cerebral convexities, suggesting subdural blood products. There is minimal, if any, mass effect upon the underlying cerebral hemispheres. No midline shift. Redemonstrated chronic lacunar infarct within the right thalamus. Moderate multifocal T2 FLAIR hyperintense signal abnormality within the cerebral white matter, nonspecific but compatible with chronic small vessel ischemic disease. Minimal chronic small-vessel ischemic changes are also present within the pons. Redemonstrated small chronic infarcts within the left cerebellar hemisphere. There is no acute infarct. No evidence of an intracranial mass. Vascular: Maintained flow voids within the proximal large arterial vessels. Skull and upper cervical spine: No focal suspicious marrow lesion. Sinuses/Orbits: Visualized orbits show no acute finding. No significant paranasal sinus disease. Other: Left mastoid effusion. These results were called by telephone at the time of interpretation on 06/12/2021 at 2:17 pm to provider [facility] , who verbally acknowledged these results. IMPRESSION: Small subdural collections overlying the bilateral cerebral hemispheres, measuring up to 4 mm in thickness on the right and 5 mm in thickness on the left, as described. These collections were not appreciated on the prior head CT of 06/04/2021, and the findings are suspicious for acute subdural hematomas. A noncontrast head CT is recommended for further evaluation.  Minimal, if any, mass effect upon the underlying cerebral hemispheres. No midline shift. Redemonstrated chronic lacunar infarct within the right thalamus. Chronic small-vessel ischemic changes which are moderate in the cerebral white matter, and mild in the pons, stable from the brain MRI of 09/12/2019. Redemonstrated small chronic infarcts within the left cerebellar hemisphere. Moderate cerebral atrophy. Comparatively mild cerebellar atrophy. Left mastoid effusion. Electronically Signed   By: Kellie Simmering D.O.   On: 06/12/2021 14:18   DG Chest Port 1 View  Result Date: 06/11/2021 CLINICAL DATA:  Respiratory distress.  Possible aspiration. EXAM: PORTABLE CHEST 1 VIEW COMPARISON:  06/11/2021 FINDINGS: Since the prior exam, patchy airspace opacities have developed in the right mid to lower lung, consistent with aspiration pneumonia/pneumonitis. There is persistent opacity at the left lung base consistent with atelectasis. Remainder of the lungs is clear. Probable small left pleural effusion. Possible small right effusion. No pneumothorax. Cardiac silhouette is normal in size. IMPRESSION: 1. New airspace opacity the right mid to lower lung consistent with aspiration pneumonia/pneumonitis. Electronically Signed   By: Lajean Manes M.D.   On: 06/11/2021 16:43   DG Chest Port 1 View  Result Date: 06/11/2021 CLINICAL DATA:  Aspiration 20 minutes ago,  audible gurgling with expiration in labored breathing, coughing, clearing throat EXAM: PORTABLE CHEST 1 VIEW COMPARISON:  Portable exam 1242 hours compared to 06/06/2021 FINDINGS: Normal heart size, mediastinal contours, and pulmonary vascularity. Atherosclerotic calcification aorta. New infiltrate versus aspiration LEFT lower lobe. Remaining lungs clear. No pleural effusion or pneumothorax. Bones demineralized. IMPRESSION: New atelectasis versus aspiration in LEFT lower lobe. Aortic Atherosclerosis (ICD10-I70.0). Electronically Signed   By: Lavonia Dana M.D.   On:  06/11/2021 14:41    Assessment/Plan: 5 Days Post-Op   Principal Problem:   Right femoral fracture (HCC) Active Problems:   Hypercholesterolemia   Benign prostatic hyperplasia   HTN (hypertension)   GERD (gastroesophageal reflux disease)   Leucocytosis   Acute hypoxemic respiratory failure (HCC)   Aspiration pneumonia of both lower lobes due to gastric secretions (HCC)   Dementia without behavioral disturbance ([place])  Patient currently stable from a cardiopulmonary standpoint.  Hemoglobin is stable.  Dr. Cruzita Lederer was going to talk to neurosurgery regarding the patient's MRI and CT scan results.  Continue with physical therapy for the patient's right lower extremity.  Patient awaiting insurance authorization for skilled nursing facility.    Thornton Park , MD 06/12/2021, 8:43 PM

## 2021-06-12 NOTE — Progress Notes (Signed)
PROGRESS NOTE  Todd Trujillo. RXV:[id number] DOB: 05/16/1930 DOA: 06/06/2021 PCP: Einar Pheasant, MD   LOS: 6 days   Brief Narrative / Interim history: 87 year old male with history of mild dementia, HTN, HLD, history of prostate cancer status post prostatectomy, comes into the hospital after having a mechanical fall at home, was found to have a right femoral neck fracture.  He tripped and landed on his right side.  No chest pain, syncope, loss of consciousness.  Denies hitting his head.  Orthopedic surgery consulted and he was admitted to the hospitalist service.  He is status post surgical repair 11/23 alert, remains confused.  Hard of hearing.  Hospital course complicated by worsening weakness, worsening dysphagia and an aspiration event on 11/27.  He also has worsening aphasia on 11/27.  Rapid response called the same evening due to hypoxic respiratory failure  Subjective / 24h Interval events: Daughter is at bedside, he seems to be doing well today, smiling.  He keeps repeating "aw" over and over again but is able to tell me his name  Assessment & Plan: Principal Problem Acute hypoxic respiratory failure due to aspiration pneumonia -patient has had significant weakness and likely had an aspiration event on 11/27.  He was also having expressive aphasia and difficulties getting words out.  Daughters tell me that he has been having some degree of aphasia at home and sometimes when he gets anxious he cannot get the words out but this appeared to be worse. -Respiratory status improving, he was on a nonrebreather yesterday but this morning on nasal cannula and he is comfortable.  Attempt an MRI of the brain today -Continue Unasyn, today's day 2 -Palliative consulted as well  Active Problems Mildly displaced intratrochanteric fracture of the right femoral neck-orthopedic surgery consulted, appreciate input.  He is status post IM fixation of the right intertrochanteric hip fracture on  11/23. -DVT prophylaxis postop per orthopedic surgery, changed Lovenox to aspirin due to persistent anemia -PT recommends SNF, initially wife was adamant that he would go home but now reconsidering.  Patient placement pending stability of 4 #1  Essential hypertension-monitor off blood pressure medications due to low normal blood pressure  Acute blood loss anemia, postoperative-hemoglobin dropped as low as 6.1.  He was transfused total of 3 unit of packed red blood cells.  Hemoglobin better but downtrending again today.  Continue to monitor.  No bleeding  Leukocytosis-worsening today in the setting of aspiration pneumonia  Iron deficiency anemia-he was transfused 3 units of packed red blood cells, overall hemoglobin stable  History of CVA-resume aspirin.  ? BPH, history of prostate cancer status post prostatectomy-based on most recent outpatient urology notes he does not have a prostate anymore.  Continue to monitor for retention postoperatively  Mild dementia-stable, appears at baseline.  High risk of postop delirium  Goals of care -discussed with the wife at bedside and for now she wishes for the patient to be full code, intubated if needed.  Later on I rediscussed this with the patient's daughter Todd Trujillo, and she is discussed with her mother that he should not be resuscitated if his heart stops.  Daughter, however, wishes, the same as her mother for patient to be intubated in case that is needed.  Ongoing discussions, palliative care consulted   Scheduled Meds:  aspirin  324 mg Oral BID   cholecalciferol  1,000 Units Oral Daily   docusate sodium  100 mg Oral BID   feeding supplement  237 mL Oral BID BM  ferrous sulfate  325 mg Oral Q breakfast   mouth rinse  15 mL Mouth Rinse BID   multivitamin with minerals  1 tablet Oral Daily   pantoprazole (PROTONIX) IV  40 mg Intravenous QAC breakfast   QUEtiapine  50 mg Oral QHS   senna  1 tablet Oral BID   sertraline  25 mg Oral Daily    traMADol  25 mg Oral Q6H   Continuous Infusions:  sodium chloride 5 mL/hr at 06/12/21 3716   ampicillin-sulbactam (UNASYN) IV Stopped (06/12/21 9678)   methocarbamol (ROBAXIN) IV 500 mg (06/12/21 1019)   promethazine (PHENERGAN) injection (IM or IVPB) 6.25 mg (06/07/21 0921)   PRN Meds:.sodium chloride, alum & mag hydroxide-simeth, bisacodyl, methocarbamol (ROBAXIN) IV, methocarbamol, ondansetron **OR** ondansetron (ZOFRAN) IV, polyethylene glycol, promethazine (PHENERGAN) injection (IM or IVPB)  Diet Orders (From admission, onward)     Start     Ordered   06/11/21 1249  Diet NPO time specified Except for: Other (See Comments)  Diet effective now       Comments: Meds via non-oral means  Question:  Except for  Answer:  Other (See Comments)   06/11/21 1248            DVT prophylaxis: SCDs Start: 06/07/21 1518 Place TED hose Start: 06/07/21 1518     Code Status: Partial Code  Family Communication: Daughter present at bedside  Status is: Inpatient  Remains inpatient appropriate because: Hypoxic respiratory failure  Level of care: Med-Surg  Consultants:  Orthopedic surgery   Procedures:  none  Microbiology  none  Antimicrobials: none    Objective: Vitals:   06/11/21 1954 06/11/21 2345 06/12/21 0431 06/12/21 0805  BP: (!) 129/56 (!) 92/46 (!) 91/49 (!) 99/57  Pulse: (!) 118 100 91 90  Resp: 18 18 20 19   Temp: 98.8 F (37.1 C) 98.8 F (37.1 C) (!) 97.4 F (36.3 C) 98.2 F (36.8 C)  TempSrc:      SpO2: 92% 94% 98% 96%  Weight:      Height:        Intake/Output Summary (Last 24 hours) at 06/12/2021 1059 Last data filed at 06/12/2021 0900 Gross per 24 hour  Intake 518.12 ml  Output 1401 ml  Net -882.88 ml    Filed Weights   06/06/21 2048 06/07/21 0106  Weight: 65 kg 69.7 kg    Examination:  Constitutional: Appears comfortable, no apparent distress Eyes: Anicteric ENMT: Moist mucous membranes Neck: normal, supple Respiratory: Diminished at  the bases, faint rhonchi at the bases, no wheezing, moves air well Cardiovascular: Regular rate and rhythm, no edema Abdomen: Soft, nontender, nondistended, positive bowel sounds Musculoskeletal: no clubbing / cyanosis.  Skin: No rashes seen Neurologic: Follows commands consistently, no apparent focal deficits other than the repetitive words.  Cranial nerves appear grossly intact  Data Reviewed: I have independently reviewed following labs and imaging studies  CBC: Recent Labs  Lab 06/06/21 2148 06/07/21 0122 06/09/21 0303 06/10/21 0917 06/11/21 0636 06/11/21 1628 06/12/21 0622  WBC 13.5*   < > 10.7* 9.7 10.1 10.0 15.0*  NEUTROABS 11.2*  --   --   --   --   --   --   HGB 9.1*   < > 7.5* 9.2* 8.6* 10.8* 9.7*  HCT 29.2*   < > 22.7* 28.3* 26.7* 33.6* 29.6*  MCV 88.2   < > 86.3 87.1 87.5 89.1 87.6  PLT 333   < > 183 200 202 251 259   < > =  values in this interval not displayed.    Basic Metabolic Panel: Recent Labs  Lab 06/09/21 0303 06/10/21 0917 06/11/21 0636 06/11/21 1628 06/12/21 0622  NA 134* 136 135 135 137  K 4.3 4.0 4.1 4.5 4.3  CL 107 106 106 103 105  CO2 24 25 25 25 24   GLUCOSE 107* 129* 111* 140* 149*  BUN 32* 23 26* 25* 32*  CREATININE 0.94 0.81 0.68 0.83 1.04  CALCIUM 7.9* 8.3* 8.0* 8.6* 8.4*    Liver Function Tests: Recent Labs  Lab 06/06/21 2148 06/07/21 0122 06/08/21 0400  AST 14* 16 13*  ALT 9 8 8   ALKPHOS 96 89 72  BILITOT 0.5 0.5 0.5  PROT 6.4* 6.5 4.8*  ALBUMIN 3.4* 3.1* 2.4*    Coagulation Profile: Recent Labs  Lab 06/06/21 2148  INR 1.5*    HbA1C: No results for input(s): HGBA1C in the last 72 hours. CBG: Recent Labs  Lab 06/11/21 1624  GLUCAP 112*    Recent Results (from the past 240 hour(s))  Resp Panel by RT-PCR (Flu A&B, Covid) Nasopharyngeal Swab     Status: None   Collection Time: 06/06/21 10:38 PM   Specimen: Nasopharyngeal Swab; Nasopharyngeal(NP) swabs in vial transport medium  Result Value Ref Range Status    SARS Coronavirus 2 by RT PCR NEGATIVE NEGATIVE Final    Comment: (NOTE) SARS-CoV-2 target nucleic acids are NOT DETECTED.  The SARS-CoV-2 RNA is generally detectable in upper respiratory specimens during the acute phase of infection. The lowest concentration of SARS-CoV-2 viral copies this assay can detect is 138 copies/mL. A negative result does not preclude SARS-Cov-2 infection and should not be used as the sole basis for treatment or other patient management decisions. A negative result may occur with  improper specimen collection/handling, submission of specimen other than nasopharyngeal swab, presence of viral mutation(s) within the areas targeted by this assay, and inadequate number of viral copies(<138 copies/mL). A negative result must be combined with clinical observations, patient history, and epidemiological information. The expected result is Negative.  Fact Sheet for Patients:  EntrepreneurPulse.com.au  Fact Sheet for Healthcare Providers:  IncredibleEmployment.be  This test is no t yet approved or cleared by the [place] FDA and  has been authorized for detection and/or diagnosis of SARS-CoV-2 by FDA under an Emergency Use Authorization (EUA). This EUA will remain  in effect (meaning this test can be used) for the duration of the COVID-19 declaration under Section 564(b)(1) of the Act, 21 U.S.C.section 360bbb-3(b)(1), unless the authorization is terminated  or revoked sooner.       Influenza A by PCR NEGATIVE NEGATIVE Final   Influenza B by PCR NEGATIVE NEGATIVE Final    Comment: (NOTE) The Xpert Xpress SARS-CoV-2/FLU/RSV plus assay is intended as an aid in the diagnosis of influenza from Nasopharyngeal swab specimens and should not be used as a sole basis for treatment. Nasal washings and aspirates are unacceptable for Xpert Xpress SARS-CoV-2/FLU/RSV testing.  Fact Sheet for  Patients: EntrepreneurPulse.com.au  Fact Sheet for Healthcare Providers: IncredibleEmployment.be  This test is not yet approved or cleared by the [place] FDA and has been authorized for detection and/or diagnosis of SARS-CoV-2 by FDA under an Emergency Use Authorization (EUA). This EUA will remain in effect (meaning this test can be used) for the duration of the COVID-19 declaration under Section 564(b)(1) of the Act, 21 U.S.C. section 360bbb-3(b)(1), unless the authorization is terminated or revoked.  Performed at [facility], [address] [place], [place] [address]  MRSA Next Gen by PCR, Nasal     Status: None   Collection Time: 06/07/21  1:39 AM   Specimen: Nasal Mucosa; Nasal Swab  Result Value Ref Range Status   MRSA by PCR Next Gen NOT DETECTED NOT DETECTED Final    Comment: (NOTE) The GeneXpert MRSA Assay (FDA approved for NASAL specimens only), is one component of a comprehensive MRSA colonization surveillance program. It is not intended to diagnose MRSA infection nor to guide or monitor treatment for MRSA infections. Test performance is not FDA approved in patients less than 65 years old. Performed at [facility], [address], [place], [place] [address]       Radiology Studies: [facility] Chest [place] 1 View  Result Date: 06/11/2021 CLINICAL DATA:  Respiratory distress.  Possible aspiration. EXAM: PORTABLE CHEST 1 VIEW COMPARISON:  06/11/2021 FINDINGS: Since the prior exam, patchy airspace opacities have developed in the right mid to lower lung, consistent with aspiration pneumonia/pneumonitis. There is persistent opacity at the left lung base consistent with atelectasis. Remainder of the lungs is clear. Probable small left pleural effusion. Possible small right effusion. No pneumothorax. Cardiac silhouette is normal in size. IMPRESSION: 1. New airspace opacity the right mid to lower lung consistent with  aspiration pneumonia/pneumonitis. Electronically Signed   By: Lajean Manes M.D.   On: 06/11/2021 16:43   DG Chest Port 1 View  Result Date: 06/11/2021 CLINICAL DATA:  Aspiration 20 minutes ago, audible gurgling with expiration in labored breathing, coughing, clearing throat EXAM: PORTABLE CHEST 1 VIEW COMPARISON:  Portable exam 1242 hours compared to 06/06/2021 FINDINGS: Normal heart size, mediastinal contours, and pulmonary vascularity. Atherosclerotic calcification aorta. New infiltrate versus aspiration LEFT lower lobe. Remaining lungs clear. No pleural effusion or pneumothorax. Bones demineralized. IMPRESSION: New atelectasis versus aspiration in LEFT lower lobe. Aortic Atherosclerosis (ICD10-I70.0). Electronically Signed   By: Lavonia Dana M.D.   On: 06/11/2021 14:41     Marzetta Board, MD, PhD [facility]  Between 7 am - 7 pm I am available, please contact me via Amion (for emergencies) or Securechat (non urgent messages)  Between 7 pm - 7 am I am not available, please contact night coverage MD/APP via Amion

## 2021-06-12 NOTE — Progress Notes (Signed)
SLP Cancellation Note  Patient Details Name: Todd Trujillo. MRN: [id number] DOB: June 06, 1930   Cancelled treatment:       Reason Eval/Treat Not Completed: Medical issues which prohibited therapy. Per chart review, Rapid Response called last night due to mental status changes and increased WOB. Pt requiring increased supplemental O2 at present. CXR 06/11/21 "1. New airspace opacity the right mid to lower lung consistent with aspiration pneumonia/pneumonitis." Low grade temp overnight and WBC elevated. Head CT pending.   SLP to hold clinical swallowing re-evaluation pending improvement in respiratory status and mental status as well as further medical work up. RN made aware.   Cherrie Gauze, M.S., [facility] [phone] [name]   Quintella Baton 06/12/2021, 8:26 AM

## 2021-06-12 NOTE — Progress Notes (Signed)
SLP Cancellation Note  Patient Details Name: Todd Trujillo. MRN: [id number] DOB: 1930-07-03   Cancelled treatment:       Reason Eval/Treat Not Completed: Patient at procedure or test/unavailable. Medical work up ongoing as pt OTF for [place] at present. Palliative Care consult pending.   SLP to continue to f/u, as appropriate, for clinical swallowing re-evaluation pending further results of further medical work up and improvements in respiratory status and mental status.   Todd Trujillo, M.S., [facility] [phone] ([place])   Quintella Baton 06/12/2021, 1:22 PM

## 2021-06-13 DIAGNOSIS — J9601 Acute respiratory failure with hypoxia: Secondary | ICD-10-CM | POA: Diagnosis not present

## 2021-06-13 DIAGNOSIS — F0390 Unspecified dementia without behavioral disturbance: Secondary | ICD-10-CM

## 2021-06-13 DIAGNOSIS — Z789 Other specified health status: Secondary | ICD-10-CM

## 2021-06-13 DIAGNOSIS — S065XAA Traumatic subdural hemorrhage with loss of consciousness status unknown, initial encounter: Secondary | ICD-10-CM | POA: Diagnosis not present

## 2021-06-13 DIAGNOSIS — S72001A Fracture of unspecified part of neck of right femur, initial encounter for closed fracture: Secondary | ICD-10-CM

## 2021-06-13 DIAGNOSIS — Z515 Encounter for palliative care: Secondary | ICD-10-CM

## 2021-06-13 DIAGNOSIS — R4182 Altered mental status, unspecified: Secondary | ICD-10-CM

## 2021-06-13 DIAGNOSIS — R0603 Acute respiratory distress: Secondary | ICD-10-CM | POA: Diagnosis not present

## 2021-06-13 DIAGNOSIS — J690 Pneumonitis due to inhalation of food and vomit: Secondary | ICD-10-CM | POA: Diagnosis not present

## 2021-06-13 DIAGNOSIS — S72141A Displaced intertrochanteric fracture of right femur, initial encounter for closed fracture: Secondary | ICD-10-CM | POA: Diagnosis not present

## 2021-06-13 DIAGNOSIS — T17908D Unspecified foreign body in respiratory tract, part unspecified causing other injury, subsequent encounter: Secondary | ICD-10-CM

## 2021-06-13 LAB — BASIC METABOLIC PANEL
Anion gap: 5 (ref 5–15)
BUN: 36 mg/dL — ABNORMAL HIGH (ref 8–23)
CO2: 27 mmol/L (ref 22–32)
Calcium: 8.3 mg/dL — ABNORMAL LOW (ref 8.9–10.3)
Chloride: 106 mmol/L (ref 98–111)
Creatinine, Ser: 0.89 mg/dL (ref 0.61–1.24)
GFR, Estimated: >60 mL/min (ref >60)
Glucose, Bld: 116 mg/dL — ABNORMAL HIGH (ref 70–99)
Potassium: 4 mmol/L (ref 3.5–5.1)
Sodium: 138 mmol/L (ref 135–145)

## 2021-06-13 LAB — CBC
HCT: 27.1 % — ABNORMAL LOW (ref 39.0–52.0)
Hemoglobin: 8.6 g/dL — ABNORMAL LOW (ref 13.0–17.0)
MCH: 28.8 pg (ref 26.0–34.0)
MCHC: 31.7 g/dL (ref 30.0–36.0)
MCV: 90.6 fL (ref 80.0–100.0)
Platelets: 262 10*3/uL (ref 150–400)
RBC: 2.99 MIL/uL — ABNORMAL LOW (ref 4.22–5.81)
RDW: 15.3 % (ref 11.5–15.5)
WBC: 12.5 10*3/uL — ABNORMAL HIGH (ref 4.0–10.5)
nRBC: 0 % (ref 0.0–0.2)

## 2021-06-13 MED ORDER — SODIUM CHLORIDE 0.9 % IV SOLN: INTRAVENOUS | Status: AC

## 2021-06-13 MED ORDER — ASPIRIN 81 MG PO CHEW
81.0000 mg | CHEWABLE_TABLET | Freq: Two times a day (BID) | ORAL | Status: DC
  Administered 2021-06-13 – 2021-06-15 (×4): 81 mg via ORAL
  Filled 2021-06-13 (×3): qty 1

## 2021-06-13 NOTE — Progress Notes (Signed)
PT Cancellation Note  Patient Details Name: Todd Trujillo. MRN: [id number] DOB: 17-Dec-1929   Cancelled Treatment:    Reason Eval/Treat Not Completed: Other (comment). Spoke with family at the bedside. They requested to wait for speech therapy to see the patient first. Discussed with nurse and the SLP. PT will continue with attempts.   Minna Merritts, PT, MPT  Percell Locus 06/13/2021, 2:44 PM

## 2021-06-13 NOTE — Consult Note (Signed)
Consultation Note Date: 06/13/2021   Patient Name: Todd Trujillo.  DOB: [birth year]  MRN: [id number]  Age / Sex: 87 y.o., male  PCP: Einar Pheasant, MD Referring Physician: Caren Griffins, MD  Reason for Consultation: Establishing goals of care  HPI/Patient Profile: 87 y.o. male  with past medical history of HTN, HLD, history of prostate cancer status post prostatectomy, and dementia admitted on 06/06/2021 with mechanical fall and subsequent surgical repair on 11/23.    11/27 patient experienced aspiration event with hypoxic respiratory failure.  Patient now remains on nasal cannula no respiratory distress.  SLP evaluation pending.    11/28 MRI brain reveals subdural hematomas.  Palliative medicine was consulted to discuss goals of care.  Clinical Assessment and Goals of Care: I have reviewed medical records including EPIC notes, labs and imaging, assessed the patient and then met with patient and his daughter Almyra Free at bedside to discuss diagnosis prognosis, [place], EOL wishes, disposition and options.  Patient is pleasant and hard of hearing.  However he is a poor historian and daughter at bedside Almyra Free provides majority of input for discussion.  However patient interjected appropriately throughout our conversation.  I introduced Palliative Medicine as specialized medical care for people living with serious illness. It focuses on providing relief from the symptoms and stress of a serious illness. The goal is to improve quality of life for both the patient and the family.  We discussed a brief life review of the patient.  He has been married to his wife for 80 years.  He worked for [employer] his entire working life.  He has 3 daughters, grandchildren, and great-grandchildren whose pictures decorate his hospital walls.   As far as functional and nutritional status prior to admission patient  was living at home with his wife.  He needed assistance with ADLs.  Cognitively his dementia was not severe.  His functional status was high since he is able to walk and complete his ADLs independently.  As per daughter, patient was no longer allowed to drive based on neurology's recommendations.  Daughter endorses that the patient had a very high quality of life at his home prior to this admission.  We discussed patient's current illness and what it means in the larger context of patient's on-going co-morbidities.  Natural disease trajectory of dementia discussed.  I outlined that dementia is a progressive and chronic disease.  Daughter endorses she is aware and just does not want him to suffer.  She herself worked as a Psychologist, sport and exercise and is familiar with DNR versus full [place].  It is her preference that he become a DNR but she also reports that her mother is not on board with this just yet.  I attempted to elicit values and goals of care important to the patient.  Patient's daughter shares that the patient would not want to suffer.  She also shares his family is very important to him.  Advance directives, concepts specific to code status, artificial feeding and hydration, and rehospitalization were  considered and discussed.  I highlighted that if the patient were to have a cardiopulmonary arrest and we were only allowed to do half of a CODE BLUE with intubation that he may not survive it.  Giving ACLS medications may perhaps resuscitate his heart but without performing compressions it would not be offering him the highest quality of medical care should they cardio pulmonary event occur.  Daughter Almyra Free expressed that she understands and wishes of the patient was a DNR.  However, the wife is the decision maker and Almyra Free shares the wife is not in full agreement with Almyra Free.  I shared that I am happy to discuss CODE STATUS and specifically what a partial or limited code would mean for the  patient.  Education offered regarding concept specific to human mortality and the limitations of medical interventions to prolong life when the patient has dementia and suffers a traumatic injury.  Family is facing treatment option decisions, advanced directive, and anticipatory care needs.  Family is considering skilled nursing facility for rehab.   Discussed with patient/family the importance of continued conversation with family and the medical providers regarding overall plan of care and treatment options, ensuring decisions are within the context of the patient's values and GOCs.    Questions and concerns were addressed. The family was encouraged to call with questions or concerns.  Palliative medicine will continue to follow patient throughout his hospitalization.  Family appears to be waiting for SLP evaluation to move forward with the next steps and disposition choices.  Primary Decision Maker NEXT OF KIN  Code Status/Advance Care Planning: Limited code  Prognosis:   Unable to determine  Discharge Planning: To Be Determined  Primary Diagnoses: Present on Admission:  Right femoral fracture (HCC)  Benign prostatic hyperplasia  GERD (gastroesophageal reflux disease)  HTN (hypertension)  Hypercholesterolemia  Leucocytosis   Physical Exam Constitutional:      Appearance: Normal appearance.  HENT:     Head: Normocephalic and atraumatic.     Mouth/Throat:     Mouth: Mucous membranes are moist.  Cardiovascular:     Rate and Rhythm: Normal rate.     Pulses: Normal pulses.  Pulmonary:     Comments: Weak, productive cough Abdominal:     Palpations: Abdomen is soft.  Musculoskeletal:     Comments: Generalized weakness  Skin:    General: Skin is warm and dry.  Neurological:     Mental Status: He is alert. Mental status is at baseline.  Psychiatric:        Mood and Affect: Mood normal.        Behavior: Behavior normal.    Vital Signs: BP 121/63 (BP Location: Left  Arm)   Pulse 82   Temp 98.2 F (36.8 C)   Resp 19   Ht $R'5\' 11"'Mq$  (1.803 m)   Wt 69.7 kg   SpO2 99%   BMI 21.42 kg/m  Pain Scale: 0-10 POSS *See Group Information*: 1-Acceptable,Awake and alert Pain Score: 1  SpO2: SpO2: 99 % O2 Device:SpO2: 99 % O2 Flow Rate: .O2 Flow Rate (L/min): 2 L/min  Palliative Assessment/Data: 50%     I discussed this patient's plan of care with patient, patient's daughter.  Thank you for this consult. Palliative medicine will continue to follow and assist holistically.   Time Total: 70 minutes Greater than 50%  of this time was spent counseling and coordinating care related to the above assessment and plan.  Signed by: Jordan Hawks, DNP, FNP-BC Palliative Medicine  Please contact Palliative Medicine Team phone at [phone] for questions and concerns.  For individual provider: See Shea Evans

## 2021-06-13 NOTE — Progress Notes (Signed)
PROGRESS NOTE  [name]. BSJ:[id number] DOB: [birth year] DOA: 06/06/2021 PCP: Einar Pheasant, MD   LOS: 7 days   Brief Narrative / Interim history: 87 year old male with history of mild dementia, HTN, HLD, history of prostate cancer status post prostatectomy, comes into the hospital after having a mechanical fall at home, was found to have a right femoral neck fracture.  He tripped and landed on his right side.  No chest pain, syncope, loss of consciousness.  Denies hitting his head.  Orthopedic surgery consulted and he was admitted to the hospitalist service.  He is status post surgical repair 11/23 alert, remains confused.  Hard of hearing.  Hospital course complicated by worsening weakness, worsening dysphagia and an aspiration event on 11/27.  He also has worsening aphasia on 11/27.  Rapid response called the same evening due to hypoxic respiratory failure.  Hospital course also complicated by subacute subdural hematomas  Subjective / 24h Interval events: Doing well this morning.  Daughter is at bedside.  Speech is better and is able to articulate some words today  Assessment & Plan: Principal Problem Acute hypoxic respiratory failure due to aspiration pneumonia -patient has had significant weakness and likely had an aspiration event on 11/27.  He was also having expressive aphasia and difficulties getting words out.  Daughters tell me that he has been having some degree of aphasia at home and sometimes when he gets anxious he cannot get the words out but this appeared to be worse. -Respiratory status improving, initially he was on nonrebreather but currently on 2 L nasal cannula. -Continue Unasyn, today's day 3, plan for 5 days -Palliative consulted as well  Active Problems Subacute subdural hematomas-bilateral, small, 4-5 mm.  There were noted on the MRI of the brain yesterday and confirmed on the CT scan.  Discussed with neurosurgery, Dr. Lacinda Axon over the phone on 11/28.   He appreciates the bleed is small, no role for surgical interventions.  He believes the bleed is roughly 72 hours, and recommends continuing aspirin full dose twice daily as originally recommended by orthopedic surgery.  Start aspirin 81 twice daily today, if patient continues to clinically remain stable could potentially increase the dose tomorrow.  Mildly displaced intratrochanteric fracture of the right femoral neck-orthopedic surgery consulted, appreciate input.  He is status post IM fixation of the right intertrochanteric hip fracture on 11/23. -He was initially on Lovenox for DVT prophylaxis but this was changed to twice daily aspirin after acute blood loss anemia requiring 3 units of packed red blood cells.  Aspirin has been on hold when subdural hematomas were discovered 11/28 however after discussion neurosurgery I will resume aspirin today, although neurosurgery was okay with resuming full dose aspirin I will resume 81 twice daily and monitor clinically -PT recommends SNF, initially wife was adamant that he would go home but now reconsidering.  Patient placement pending stability of #1 and #2  Essential hypertension-monitor off blood pressure medications due to low normal blood pressure  Acute blood loss anemia, postoperative, iron deficiency anemia-hemoglobin dropped as low as 6.1.  He was transfused total of 3 unit of packed red blood cells.  Hemoglobin overall stable.  Leukocytosis-worsened in the setting of aspiration pneumonia, now getting better  History of CVA-resume aspirin.  ? BPH, history of prostate cancer status post prostatectomy-based on most recent outpatient urology notes he does not have a prostate anymore.  Continue to monitor for retention postoperatively  Mild dementia-stable, appears at baseline.  High risk of postop delirium  Goals of care -after extensive goals of care discussions with the family, patient is now partial code, no chest compressions but intubate if  needed.  Rediscussed again today at bedside with the daughter will be his or their wishes should he fail speech eval again and unable to meet his nutritional needs.  She tells me she will discuss this further with the rest of the family   Scheduled Meds:  aspirin  81 mg Oral BID   cholecalciferol  1,000 Units Oral Daily   docusate sodium  100 mg Oral BID   feeding supplement  237 mL Oral BID BM   ferrous sulfate  325 mg Oral Q breakfast   mouth rinse  15 mL Mouth Rinse BID   multivitamin with minerals  1 tablet Oral Daily   pantoprazole (PROTONIX) IV  40 mg Intravenous QAC breakfast   QUEtiapine  50 mg Oral QHS   senna  1 tablet Oral BID   sertraline  25 mg Oral Daily   traMADol  25 mg Oral Q6H   Continuous Infusions:  sodium chloride Stopped (06/12/21 1613)   ampicillin-sulbactam (UNASYN) IV 3 g (06/13/21 0513)   methocarbamol (ROBAXIN) IV 500 mg (06/13/21 0002)   promethazine (PHENERGAN) injection (IM or IVPB) 6.25 mg (06/07/21 0921)   PRN Meds:.sodium chloride, alum & mag hydroxide-simeth, bisacodyl, methocarbamol (ROBAXIN) IV, methocarbamol, ondansetron **OR** ondansetron (ZOFRAN) IV, polyethylene glycol, promethazine (PHENERGAN) injection (IM or IVPB)  Diet Orders (From admission, onward)     Start     Ordered   06/11/21 1249  Diet NPO time specified Except for: Other (See Comments)  Diet effective now       Comments: Meds via non-oral means  Question:  Except for  Answer:  Other (See Comments)   06/11/21 1248            DVT prophylaxis: SCDs Start: 06/07/21 1518 Place TED hose Start: 06/07/21 1518     Code Status: Partial Code  Family Communication: Daughter present at bedside  Status is: Inpatient  Remains inpatient appropriate because: Hypoxic respiratory failure  Level of care: Med-Surg  Consultants:  Orthopedic surgery   Procedures:  none  Microbiology  none  Antimicrobials: none    Objective: Vitals:   06/12/21 2032 06/13/21 0646 06/13/21  0803 06/13/21 1110  BP: (!) 111/53 131/68 117/62 121/63  Pulse: 95 83 80 82  Resp: 18 17 16 19   Temp: 97.7 F (36.5 C) 98.1 F (36.7 C) (!) 97.5 F (36.4 C) 98.2 F (36.8 C)  TempSrc:      SpO2: 97% 100% 100% (!) 89%  Weight:      Height:        Intake/Output Summary (Last 24 hours) at 06/13/2021 1126 Last data filed at 06/13/2021 1046 Gross per 24 hour  Intake 537.48 ml  Output 350 ml  Net 187.48 ml    Filed Weights   06/06/21 2048 06/07/21 0106  Weight: 65 kg 69.7 kg    Examination:  Constitutional: No distress Eyes: No scleral icterus ENMT: mmm Neck: normal, supple Respiratory: Clear to auscultation, no wheezing, no crackles Cardiovascular: Regular rate and rhythm, no edema Abdomen: Soft, nontender, nondistended, positive bowel sounds Musculoskeletal: no clubbing / cyanosis.  Skin: No rash seen Neurologic: Nonfocal, speech is significantly improved today  Data Reviewed: I have independently reviewed following labs and imaging studies  CBC: Recent Labs  Lab 06/06/21 2148 06/07/21 0122 06/10/21 0917 06/11/21 0636 06/11/21 1628 06/12/21 0622 06/13/21 0405  WBC 13.5*   < >  9.7 10.1 10.0 15.0* 12.5*  NEUTROABS 11.2*  --   --   --   --   --   --   HGB 9.1*   < > 9.2* 8.6* 10.8* 9.7* 8.6*  HCT 29.2*   < > 28.3* 26.7* 33.6* 29.6* 27.1*  MCV 88.2   < > 87.1 87.5 89.1 87.6 90.6  PLT 333   < > 200 202 251 259 262   < > = values in this interval not displayed.    Basic Metabolic Panel: Recent Labs  Lab 06/10/21 0917 06/11/21 0636 06/11/21 1628 06/12/21 0622 06/13/21 0405  NA 136 135 135 137 138  K 4.0 4.1 4.5 4.3 4.0  CL 106 106 103 105 106  CO2 25 25 25 24 27   GLUCOSE 129* 111* 140* 149* 116*  BUN 23 26* 25* 32* 36*  CREATININE 0.81 0.68 0.83 1.04 0.89  CALCIUM 8.3* 8.0* 8.6* 8.4* 8.3*    Liver Function Tests: Recent Labs  Lab 06/06/21 2148 06/07/21 0122 06/08/21 0400  AST 14* 16 13*  ALT 9 8 8   ALKPHOS 96 89 72  BILITOT 0.5 0.5 0.5   PROT 6.4* 6.5 4.8*  ALBUMIN 3.4* 3.1* 2.4*    Coagulation Profile: Recent Labs  Lab 06/06/21 2148  INR 1.5*    HbA1C: No results for input(s): HGBA1C in the last 72 hours. CBG: Recent Labs  Lab 06/11/21 1624  GLUCAP 112*     Recent Results (from the past 240 hour(s))  Resp Panel by RT-PCR (Flu A&B, Covid) Nasopharyngeal Swab     Status: None   Collection Time: 06/06/21 10:38 PM   Specimen: Nasopharyngeal Swab; Nasopharyngeal(NP) swabs in vial transport medium  Result Value Ref Range Status   SARS Coronavirus 2 by RT PCR NEGATIVE NEGATIVE Final    Comment: (NOTE) SARS-CoV-2 target nucleic acids are NOT DETECTED.  The SARS-CoV-2 RNA is generally detectable in upper respiratory specimens during the acute phase of infection. The lowest concentration of SARS-CoV-2 viral copies this assay can detect is 138 copies/mL. A negative result does not preclude SARS-Cov-2 infection and should not be used as the sole basis for treatment or other patient management decisions. A negative result may occur with  improper specimen collection/handling, submission of specimen other than nasopharyngeal swab, presence of viral mutation(s) within the areas targeted by this assay, and inadequate number of viral copies(<138 copies/mL). A negative result must be combined with clinical observations, patient history, and epidemiological information. The expected result is Negative.  Fact Sheet for Patients:  EntrepreneurPulse.com.au  Fact Sheet for Healthcare Providers:  IncredibleEmployment.be  This test is no t yet approved or cleared by the [place] FDA and  has been authorized for detection and/or diagnosis of SARS-CoV-2 by FDA under an Emergency Use Authorization (EUA). This EUA will remain  in effect (meaning this test can be used) for the duration of the COVID-19 declaration under Section 564(b)(1) of the Act, 21 U.S.C.section 360bbb-3(b)(1),  unless the authorization is terminated  or revoked sooner.       Influenza A by PCR NEGATIVE NEGATIVE Final   Influenza B by PCR NEGATIVE NEGATIVE Final    Comment: (NOTE) The Xpert Xpress SARS-CoV-2/FLU/RSV plus assay is intended as an aid in the diagnosis of influenza from Nasopharyngeal swab specimens and should not be used as a sole basis for treatment. Nasal washings and aspirates are unacceptable for Xpert Xpress SARS-CoV-2/FLU/RSV testing.  Fact Sheet for Patients: EntrepreneurPulse.com.au  Fact Sheet for Healthcare Providers: IncredibleEmployment.be  This test is not yet approved or cleared by the [place] and has been authorized for detection and/or diagnosis of SARS-CoV-2 by FDA under an Emergency Use Authorization (EUA). This EUA will remain in effect (meaning this test can be used) for the duration of the COVID-19 declaration under Section 564(b)(1) of the Act, 21 U.S.C. section 360bbb-3(b)(1), unless the authorization is terminated or revoked.  Performed at [facility], [address] [place], [place] [address]   MRSA Next Gen by PCR, Nasal     Status: None   Collection Time: 06/07/21  1:39 AM   Specimen: Nasal Mucosa; Nasal Swab  Result Value Ref Range Status   MRSA by PCR Next Gen NOT DETECTED NOT DETECTED Final    Comment: (NOTE) The GeneXpert MRSA Assay (FDA approved for NASAL specimens only), is one component of a comprehensive MRSA colonization surveillance program. It is not intended to diagnose MRSA infection nor to guide or monitor treatment for MRSA infections. Test performance is not FDA approved in patients less than 71 years old. Performed at [facility], [address], [place], [place] [address]       Radiology Studies: CT HEAD WO CONTRAST (5MM)  Result Date: 06/12/2021 CLINICAL DATA:  Mental status change. Cerebral hemorrhage suspected. EXAM: CT HEAD WITHOUT CONTRAST  TECHNIQUE: Contiguous axial images were obtained from the base of the skull through the vertex without intravenous contrast. COMPARISON:  MR head with contrast 06/12/2021 FINDINGS: Brain: Moderate atrophy and white matter changes are again seen. No acute parenchymal infarct or hemorrhage is present. The ventricles are proportionate to the degree of atrophy. Thin extra-axial collections are again noted. Collections measure a 3-4 mm on coronal imaging. No acute hyperdense blood is present. Hazy densities are present bilaterally. No significant mass effect or midline shift is present. Vascular: Atherosclerotic calcifications are again noted within the cavernous internal carotid arteries bilaterally. No hyperdense vessel present. Skull: Calvarium is intact. No focal lytic or blastic lesions are present. No significant extracranial soft tissue lesion is present. Sinuses/Orbits: The paranasal sinuses and mastoid air cells are clear. The globes and orbits are within normal limits. IMPRESSION: 1. Stable subacute subdural hematomas bilaterally without significant mass effect or midline shift. No hyperdense blood products are present. 2. Stable atrophy and white matter disease. 3. No other acute intracranial abnormality or significant interval change. No focal parenchymal changes from the prior studies. Electronically Signed   By: San Morelle M.D.   On: 06/12/2021 15:01   MR BRAIN WO CONTRAST  Result Date: 06/12/2021 CLINICAL DATA:  Mental status change, unknown cause. Additional history provided: Mental status change last night status post hip surgery. EXAM: MRI HEAD WITHOUT CONTRAST TECHNIQUE: Multiplanar, multiecho pulse sequences of the brain and surrounding structures were obtained without intravenous contrast. COMPARISON:  Prior head CT examinations 06/06/2021 and earlier. Brain MRI 09/12/2019. FINDINGS: Brain: Moderate cerebral atrophy. Comparatively mild cerebellar atrophy. There are small subdural  collections overlying the bilateral cerebral hemispheres, measuring up to 4 mm in thickness on the right, and 5 mm in thickness on the left. The subdural collections are mildly hyperintense relative to CSF on the axial T2 FLAIR sequence. There are small scattered foci of SWI signal loss along the bilateral cerebral convexities, suggesting subdural blood products. There is minimal, if any, mass effect upon the underlying cerebral hemispheres. No midline shift. Redemonstrated chronic lacunar infarct within the right thalamus. Moderate multifocal T2 FLAIR hyperintense signal abnormality within the cerebral white matter, nonspecific but compatible with  chronic small vessel ischemic disease. Minimal chronic small-vessel ischemic changes are also present within the pons. Redemonstrated small chronic infarcts within the left cerebellar hemisphere. There is no acute infarct. No evidence of an intracranial mass. Vascular: Maintained flow voids within the proximal large arterial vessels. Skull and upper cervical spine: No focal suspicious marrow lesion. Sinuses/Orbits: Visualized orbits show no acute finding. No significant paranasal sinus disease. Other: Left mastoid effusion. These results were called by telephone at the time of interpretation on 06/12/2021 at 2:17 pm to provider [facility] , who verbally acknowledged these results. IMPRESSION: Small subdural collections overlying the bilateral cerebral hemispheres, measuring up to 4 mm in thickness on the right and 5 mm in thickness on the left, as described. These collections were not appreciated on the prior head CT of 06/04/2021, and the findings are suspicious for acute subdural hematomas. A noncontrast head CT is recommended for further evaluation. Minimal, if any, mass effect upon the underlying cerebral hemispheres. No midline shift. Redemonstrated chronic lacunar infarct within the right thalamus. Chronic small-vessel ischemic changes which are moderate in the  cerebral white matter, and mild in the pons, stable from the brain MRI of 09/12/2019. Redemonstrated small chronic infarcts within the left cerebellar hemisphere. Moderate cerebral atrophy. Comparatively mild cerebellar atrophy. Left mastoid effusion. Electronically Signed   By: Kellie Simmering D.O.   On: 06/12/2021 14:18     Marzetta Board, MD, PhD [facility]  Between 7 am - 7 pm I am available, please contact me via Amion (for emergencies) or Securechat (non urgent messages)  Between 7 pm - 7 am I am not available, please contact night coverage MD/APP via Amion

## 2021-06-13 NOTE — Progress Notes (Signed)
Eeg done 

## 2021-06-13 NOTE — Procedures (Addendum)
History: 87 year old male being evaluated for altered mental status  Sedation: None  Technique: This EEG was acquired with electrodes placed according to the International 10-20 electrode system (including Fp1, Fp2, F3, F4, C3, C4, P3, P4, O1, O2, T3, T4, T5, T6, A1, A2, Fz, Cz, Pz). The following electrodes were missing or displaced: none.   Background: There is slowing of the background activities with a posterior dominant rhythm achieving 7 Hz as well as some normal-appearing frontally predominant beta.  In addition, there are irregular generalized theta and delta range activities intruding into the background.  Photic stimulation: Physiologic driving is present  EEG Abnormalities: 1) generalized irregular slow activity 2) slow posterior dominant rhythm  Clinical Interpretation: This EEG is consistent with a mild generalized nonspecific cerebral dysfunction (encephalopathy).   There was no seizure or seizure predisposition recorded on this study. Please note that lack of epileptiform activity on EEG does not preclude the possibility of epilepsy.   Roland Rack, MD [facility] [phone]  If 7pm- 7am, please page neurology on call as listed in [place].

## 2021-06-13 NOTE — Progress Notes (Addendum)
Speech Language Pathology Treatment: Dysphagia  Patient Details Name: Todd Trujillo. MRN: [id number] DOB: [birth year] Today's Date: 06/13/2021 Time: 1505-1600 SLP Time Calculation (min) (ACUTE ONLY): 55 min  Assessment / Plan / Recommendation Clinical Impression  Pt seen today for ongoing assessment of swallowing and potential upgrade to oral diet. Pt continues to present w/ more alertness per Family report and chart notes; he verbally engaged w/ SLP and answered general questions re: self. Wife stated this was the "first time" she has seen him initiate conversation w/ someone else since the weekend. Noted tremulous motor activity in UEs, oral cavity. He was orally sensitive and withdrew from oral care at times provided by SLP.  Noted min Baseline congested cough - pt has been coughing and spitting/clearing "large globs" of Phlegm per Daughter and Wife present in room. Noted changes in CXR w/ presentation of R lobe pneumonia. However, pt's Wife also stated he has "terrible belching at home after eating" -- wonder if he has baseline Reflux w/ increased impact during this time of stress.  Pt required min-mod verbal/tactile cues to ensure follow through w/ general precautions, and positioning more midline in bed. Pt is on  O2 support. WBC slightly elevated.    Pt and family explained aspiration precautions which he followed as he fed himself trials of Nectar liquids Via Cup and purees; ice chips given prior to all other po's post oral care w/ the following:  Overt s/s of aspiration w/ ice chip trials c/b coughing and wet vocal quality; multiple swallows; suspect delayed swallow initiation.  Inconsistent, overt s/s of aspiration w/ Nectar liquids and purees c/b delayed cough x1 w/ Nectar liquids; wet vocal quality x1-2 post trials; audible swallows intermittently; suspect delayed swallow initiation. No increased frequency of coughing or poor tolerance of modified food/liquid  consistencies. Oral phase c/b slow A-P transfer w/ increased texture(puree) trials w/ Time needed for A-P transfer/swallow. Oral clearing adequate. Noted increased lingual activity during bolus manipulation. More timely oral phase w/ trials of nectar liquids.   No sustained coughing, respiratory status remained calm and unlabored, vocal quality fairly clear b/t majority of trials. O2 sats remained 96-98% when checked. Pt held Cup when drinking following instructions for single, small sips slowly most of the time -- verbal cues given. NO straws were utiilized for better oral control while drinking.  Pt was wearing Dentures -- family reported possible sore spot on gums. Encouraged further assessment of this and removal of Dentures more often for healing. Supplies given for cleaning.  Encouraged rest breaks and alternating foods/liquids during intake as well as throat clear/cough to clear any Phlegm. IF coughing occurs during oral intake, STOP po's and rest.   Pt appears at increased risk for dysphagia w/ aspiration d/t Baseline dx/presentation of Dementia, recent trauma of Fall w/ hip fx/surgery, subacute SDHs, hx of CVA, dependence on assistance for feeding, medical comorbidities, and dental status. Pt appears to be improving in presentation today per family.   Recommend a Dysphagia level 1 diet (purees) w/ gravies added to moisten foods;  nectar liquids. Recommend aspiration precautions; Pills Crushed in Puree for cohesion and safer swallowing currently; tray setup and positioning assistance for meals w/ support as needed. REFLUX precautions recommended. Feeding Support letting pt hold cup to drink. ST services will f/u w/ toleration of diet and trials to upgrade as able. Precautions posted at bedside. NSG updated.  Much Education w/ Family present in room today.      HPI HPI: Pt is a Todd Trujillo with medical hx significant for Dementia, hyperlipidemia, HTN, macular degeneration of the eyes, BPH, who  had a mechanical Fall in his home that resulted in a R femoral neck fracture. Pt is now s/p R intramedullary fixation. MRI revealed chronic atrophic and ischemic changes stable from the prior study. Head CT w/ Contrast performed d/t mental status changes: "Moderate atrophy and white matter changes are again seen. No  acute parenchymal infarct or hemorrhage is present. Stable subacute subdural hematomas bilaterally without  significant mass effect or midline shift. No hyperdense blood  products are present.".      SLP Plan  Continue with current plan of care      Recommendations for follow up therapy are one component of a multi-disciplinary discharge planning process, led by the attending physician.  Recommendations may be updated based on patient status, additional functional criteria and insurance authorization.    Recommendations  Diet recommendations: Dysphagia 1 (puree);Nectar-thick liquid Liquids provided via: Cup;No straw Medication Administration: Crushed with puree Supervision: Patient able to self feed;Staff to assist with self feeding;Full supervision/cueing for compensatory strategies Compensations: Minimize environmental distractions;Slow rate;Small sips/bites;Lingual sweep for clearance of pocketing;Multiple dry swallows after each bite/sip;Follow solids with liquid;Clear throat intermittently Postural Changes and/or Swallow Maneuvers: Out of bed for meals;Seated upright 90 degrees;Upright 30-60 min after meal                General recommendations:  (Dietician f/u; Palliative Care f/u for [place]) Oral Care Recommendations: Oral care BID;Oral care before and after PO;Staff/trained caregiver to provide oral care Follow Up Recommendations: Skilled nursing-short term rehab (<3 hours/day) Assistance recommended at discharge: Frequent or constant Supervision/Assistance SLP Visit Diagnosis: Dysphagia, oropharyngeal phase (R13.12) (unsure of Cognitive status) Plan: Continue with  current plan of care       GO                  [name], [place], CCC-SLP Speech Language Pathologist Rehab Services [phone] [facility]  06/13/2021, 4:50 PM

## 2021-06-13 NOTE — Progress Notes (Signed)
Unable to evaluate the patient as he off the floor for EEG procedure.

## 2021-06-14 DIAGNOSIS — J9601 Acute respiratory failure with hypoxia: Secondary | ICD-10-CM | POA: Diagnosis not present

## 2021-06-14 DIAGNOSIS — F0390 Unspecified dementia without behavioral disturbance: Secondary | ICD-10-CM | POA: Diagnosis not present

## 2021-06-14 DIAGNOSIS — S72141A Displaced intertrochanteric fracture of right femur, initial encounter for closed fracture: Secondary | ICD-10-CM | POA: Diagnosis not present

## 2021-06-14 DIAGNOSIS — S72001A Fracture of unspecified part of neck of right femur, initial encounter for closed fracture: Secondary | ICD-10-CM | POA: Diagnosis not present

## 2021-06-14 DIAGNOSIS — J690 Pneumonitis due to inhalation of food and vomit: Secondary | ICD-10-CM | POA: Diagnosis not present

## 2021-06-14 LAB — COMPREHENSIVE METABOLIC PANEL
ALT: 14 U/L (ref 0–44)
AST: 22 U/L (ref 15–41)
Albumin: 2.2 g/dL — ABNORMAL LOW (ref 3.5–5.0)
Alkaline Phosphatase: 84 U/L (ref 38–126)
Anion gap: 3 — ABNORMAL LOW (ref 5–15)
BUN: 32 mg/dL — ABNORMAL HIGH (ref 8–23)
CO2: 26 mmol/L (ref 22–32)
Calcium: 8.1 mg/dL — ABNORMAL LOW (ref 8.9–10.3)
Chloride: 110 mmol/L (ref 98–111)
Creatinine, Ser: 0.84 mg/dL (ref 0.61–1.24)
GFR, Estimated: >60 mL/min (ref >60)
Glucose, Bld: 108 mg/dL — ABNORMAL HIGH (ref 70–99)
Potassium: 3.7 mmol/L (ref 3.5–5.1)
Sodium: 139 mmol/L (ref 135–145)
Total Bilirubin: 1 mg/dL (ref 0.3–1.2)
Total Protein: 5.2 g/dL — ABNORMAL LOW (ref 6.5–8.1)

## 2021-06-14 LAB — CBC
HCT: 25.5 % — ABNORMAL LOW (ref 39.0–52.0)
Hemoglobin: 8.1 g/dL — ABNORMAL LOW (ref 13.0–17.0)
MCH: 28.7 pg (ref 26.0–34.0)
MCHC: 31.8 g/dL (ref 30.0–36.0)
MCV: 90.4 fL (ref 80.0–100.0)
Platelets: 284 10*3/uL (ref 150–400)
RBC: 2.82 MIL/uL — ABNORMAL LOW (ref 4.22–5.81)
RDW: 15 % (ref 11.5–15.5)
WBC: 10.8 10*3/uL — ABNORMAL HIGH (ref 4.0–10.5)
nRBC: 0 % (ref 0.0–0.2)

## 2021-06-14 LAB — VITAMIN B12: Vitamin B-12: 516 pg/mL (ref 180–914)

## 2021-06-14 LAB — RESP PANEL BY RT-PCR (FLU A&B, COVID) ARPGX2
Influenza A by PCR: NEGATIVE
Influenza B by PCR: NEGATIVE
SARS Coronavirus 2 by RT PCR: NEGATIVE

## 2021-06-14 LAB — MAGNESIUM: Magnesium: 2.2 mg/dL (ref 1.7–2.4)

## 2021-06-14 MED ORDER — NEPRO/CARBSTEADY PO LIQD
237.0000 mL | Freq: Two times a day (BID) | ORAL | Status: DC
  Administered 2021-06-14: 237 mL via ORAL

## 2021-06-14 NOTE — Progress Notes (Signed)
PROGRESS NOTE    Todd Trujillo.  OZH:[id number] DOB: 24-Oct-1929 DOA: 06/06/2021 PCP: Einar Pheasant, MD    Brief Narrative:  87 year old male with history of mild dementia, HTN, HLD, history of prostate cancer status post prostatectomy, comes into the hospital after having a mechanical fall at home, was found to have a right femoral neck fracture.  He tripped and landed on his right side.  No chest pain, syncope, loss of consciousness.  Denies hitting his head.  Orthopedic surgery consulted and he was admitted to the hospitalist service.  He is status post surgical repair 11/23 alert, remains confused.  Hard of hearing.  Hospital course complicated by worsening weakness, worsening dysphagia and an aspiration event on 11/27.  He also has worsening aphasia on 11/27.  Rapid response called the same evening due to hypoxic respiratory failure.  Hospital course also complicated by subacute subdural hematomas   Assessment & Plan:   Principal Problem:   Right femoral fracture (HCC) Active Problems:   Hypercholesterolemia   Benign prostatic hyperplasia   HTN (hypertension)   GERD (gastroesophageal reflux disease)   Leucocytosis   Acute hypoxemic respiratory failure (HCC)   Aspiration pneumonia of both lower lobes due to gastric secretions (HCC)   Dementia without behavioral disturbance (HCC)   Acute hypoxemic respiratory failure secondary to aspiration pneumonia Aspiration pneumonia Patient gradually improving, is currently still on 2 L oxygen, will continue finish up 5 days Unasyn. Patient has been seen by speech therapy, placed on dysphagia 1 diet.  Subdural hematoma. Condition has been stable.  Mildly displaced intratrochanteric fracture of the right femoral neck- Status post ORIF 11/23. Patient has been doing well.  Acute blood loss anemia. Iron deficient anemia. Hemoglobin has been stabilized.      DVT prophylaxis: SCDs Code Status: partial Family  Communication: Daughter updated at bedside. Disposition Plan:    Status is: Inpatient  Remains inpatient appropriate because: Severity of disease and IV antibiotics. Will discharge to SNF tomorrow.       I/O last 3 completed shifts: In: 565.2 [I.V.:115.2; IV Piggyback:450] Out: 1050 [Urine:1050] No intake/output data recorded.     Consultants:  None  Procedures: Hip  Antimicrobials: Unasyn.  Subjective: Patient doing better, still on 2 L oxygen, no signal short of breath.  No cough. No chest pain palpitation pain No fever chills No dysuria hematuria  No abdominal pain or nausea vomiting.  Objective: Vitals:   06/13/21 2050 06/14/21 0559 06/14/21 0740 06/14/21 1115  BP: 123/68 126/65 133/65 120/63  Pulse: 80 73 74 75  Resp: 17 17 16 20   Temp: 98.6 F (37 C) 98.2 F (36.8 C) 97.7 F (36.5 C) 98.5 F (36.9 C)  TempSrc:   Oral   SpO2: 98% 98% 99% 100%  Weight:      Height:        Intake/Output Summary (Last 24 hours) at 06/14/2021 1421 Last data filed at 06/14/2021 0604 Gross per 24 hour  Intake 211.6 ml  Output 700 ml  Net -488.4 ml   Filed Weights   06/06/21 2048 06/07/21 0106  Weight: 65 kg 69.7 kg    Examination:  General exam: Appears calm and comfortable  Respiratory system: Clear to auscultation. Respiratory effort normal. Cardiovascular system: S1 & S2 heard, RRR. No JVD, murmurs, rubs, gallops or clicks. No pedal edema. Gastrointestinal system: Abdomen is nondistended, soft and nontender. No organomegaly or masses felt. Normal bowel sounds heard. Central nervous system: Alert and oriented x2. No focal neurological deficits. Extremities:  Symmetric 5 x 5 power. Skin: No rashes, lesions or ulcers Psychiatry:  Mood & affect appropriate.     Data Reviewed: I have personally reviewed following labs and imaging studies  CBC: Recent Labs  Lab 06/11/21 0636 06/11/21 1628 06/12/21 0622 06/13/21 0405 06/14/21 0241  WBC 10.1 10.0 15.0*  12.5* 10.8*  HGB 8.6* 10.8* 9.7* 8.6* 8.1*  HCT 26.7* 33.6* 29.6* 27.1* 25.5*  MCV 87.5 89.1 87.6 90.6 90.4  PLT 202 251 259 262 625   Basic Metabolic Panel: Recent Labs  Lab 06/11/21 0636 06/11/21 1628 06/12/21 0622 06/13/21 0405 06/14/21 0241  NA 135 135 137 138 139  K 4.1 4.5 4.3 4.0 3.7  CL 106 103 105 106 110  CO2 25 25 24 27 26   GLUCOSE 111* 140* 149* 116* 108*  BUN 26* 25* 32* 36* 32*  CREATININE 0.68 0.83 1.04 0.89 0.84  CALCIUM 8.0* 8.6* 8.4* 8.3* 8.1*  MG  --   --   --   --  2.2   GFR: Estimated Creatinine Clearance: 58.8 mL/min (by C-G formula based on SCr of 0.84 mg/dL). Liver Function Tests: Recent Labs  Lab 06/08/21 0400 06/14/21 0241  AST 13* 22  ALT 8 14  ALKPHOS 72 84  BILITOT 0.5 1.0  PROT 4.8* 5.2*  ALBUMIN 2.4* 2.2*   No results for input(s): LIPASE, AMYLASE in the last 168 hours. No results for input(s): AMMONIA in the last 168 hours. Coagulation Profile: No results for input(s): INR, PROTIME in the last 168 hours. Cardiac Enzymes: No results for input(s): CKTOTAL, CKMB, CKMBINDEX, TROPONINI in the last 168 hours. BNP (last 3 results) No results for input(s): PROBNP in the last 8760 hours. HbA1C: No results for input(s): HGBA1C in the last 72 hours. CBG: Recent Labs  Lab 06/11/21 1624  GLUCAP 112*   Lipid Profile: No results for input(s): CHOL, HDL, LDLCALC, TRIG, CHOLHDL, LDLDIRECT in the last 72 hours. Thyroid Function Tests: No results for input(s): TSH, T4TOTAL, FREET4, T3FREE, THYROIDAB in the last 72 hours. Anemia Panel: No results for input(s): VITAMINB12, FOLATE, FERRITIN, TIBC, IRON, RETICCTPCT in the last 72 hours. Sepsis Labs: No results for input(s): PROCALCITON, LATICACIDVEN in the last 168 hours.  Recent Results (from the past 240 hour(s))  Resp Panel by RT-PCR (Flu A&B, Covid) Nasopharyngeal Swab     Status: None   Collection Time: 06/06/21 10:38 PM   Specimen: Nasopharyngeal Swab; Nasopharyngeal(NP) swabs in vial  transport medium  Result Value Ref Range Status   SARS Coronavirus 2 by RT PCR NEGATIVE NEGATIVE Final    Comment: (NOTE) SARS-CoV-2 target nucleic acids are NOT DETECTED.  The SARS-CoV-2 RNA is generally detectable in upper respiratory specimens during the acute phase of infection. The lowest concentration of SARS-CoV-2 viral copies this assay can detect is 138 copies/mL. A negative result does not preclude SARS-Cov-2 infection and should not be used as the sole basis for treatment or other patient management decisions. A negative result may occur with  improper specimen collection/handling, submission of specimen other than nasopharyngeal swab, presence of viral mutation(s) within the areas targeted by this assay, and inadequate number of viral copies(<138 copies/mL). A negative result must be combined with clinical observations, patient history, and epidemiological information. The expected result is Negative.  Fact Sheet for Patients:  EntrepreneurPulse.com.au  Fact Sheet for Healthcare Providers:  IncredibleEmployment.be  This test is no t yet approved or cleared by the [place] FDA and  has been authorized for detection and/or diagnosis of SARS-CoV-2  by FDA under an Emergency Use Authorization (EUA). This EUA will remain  in effect (meaning this test can be used) for the duration of the COVID-19 declaration under Section 564(b)(1) of the Act, 21 U.S.C.section 360bbb-3(b)(1), unless the authorization is terminated  or revoked sooner.       Influenza A by PCR NEGATIVE NEGATIVE Final   Influenza B by PCR NEGATIVE NEGATIVE Final    Comment: (NOTE) The Xpert Xpress SARS-CoV-2/FLU/RSV plus assay is intended as an aid in the diagnosis of influenza from Nasopharyngeal swab specimens and should not be used as a sole basis for treatment. Nasal washings and aspirates are unacceptable for Xpert Xpress SARS-CoV-2/FLU/RSV testing.  Fact  Sheet for Patients: EntrepreneurPulse.com.au  Fact Sheet for Healthcare Providers: IncredibleEmployment.be  This test is not yet approved or cleared by the [place] FDA and has been authorized for detection and/or diagnosis of SARS-CoV-2 by FDA under an Emergency Use Authorization (EUA). This EUA will remain in effect (meaning this test can be used) for the duration of the COVID-19 declaration under Section 564(b)(1) of the Act, 21 U.S.C. section 360bbb-3(b)(1), unless the authorization is terminated or revoked.  Performed at [facility], [address] [place], [place] [address]   MRSA Next Gen by PCR, Nasal     Status: None   Collection Time: 06/07/21  1:39 AM   Specimen: Nasal Mucosa; Nasal Swab  Result Value Ref Range Status   MRSA by PCR Next Gen NOT DETECTED NOT DETECTED Final    Comment: (NOTE) The GeneXpert MRSA Assay (FDA approved for NASAL specimens only), is one component of a comprehensive MRSA colonization surveillance program. It is not intended to diagnose MRSA infection nor to guide or monitor treatment for MRSA infections. Test performance is not FDA approved in patients less than 42 years old. Performed at [facility], [address], [place], [place] [address]          Radiology Studies: CT HEAD WO CONTRAST (5MM)  Result Date: 06/12/2021 CLINICAL DATA:  Mental status change. Cerebral hemorrhage suspected. EXAM: CT HEAD WITHOUT CONTRAST TECHNIQUE: Contiguous axial images were obtained from the base of the skull through the vertex without intravenous contrast. COMPARISON:  MR head with contrast 06/12/2021 FINDINGS: Brain: Moderate atrophy and white matter changes are again seen. No acute parenchymal infarct or hemorrhage is present. The ventricles are proportionate to the degree of atrophy. Thin extra-axial collections are again noted. Collections measure a 3-4 mm on coronal imaging. No acute  hyperdense blood is present. Hazy densities are present bilaterally. No significant mass effect or midline shift is present. Vascular: Atherosclerotic calcifications are again noted within the cavernous internal carotid arteries bilaterally. No hyperdense vessel present. Skull: Calvarium is intact. No focal lytic or blastic lesions are present. No significant extracranial soft tissue lesion is present. Sinuses/Orbits: The paranasal sinuses and mastoid air cells are clear. The globes and orbits are within normal limits. IMPRESSION: 1. Stable subacute subdural hematomas bilaterally without significant mass effect or midline shift. No hyperdense blood products are present. 2. Stable atrophy and white matter disease. 3. No other acute intracranial abnormality or significant interval change. No focal parenchymal changes from the prior studies. Electronically Signed   By: San Morelle M.D.   On: 06/12/2021 15:01   EEG adult  Result Date: 06/13/2021 Greta Doom, MD     06/13/2021  3:21 PM History: 87 year old male being evaluated for altered mental status Sedation: None Technique: This EEG was acquired with electrodes placed according  to the International 10-20 electrode system (including Fp1, Fp2, F3, F4, C3, C4, P3, P4, O1, O2, T3, T4, T5, T6, A1, A2, Fz, Cz, Pz). The following electrodes were missing or displaced: none. Background: There is slowing of the background activities with a posterior dominant rhythm achieving 7 Hz as well as some normal-appearing frontally predominant beta.  In addition, there are irregular generalized theta and delta range activities intruding into the background. Photic stimulation: Physiologic driving is present EEG Abnormalities: 1) generalized irregular slow activity 2) slow posterior dominant rhythm Clinical Interpretation: This EEG is consistent with a mild generalized nonspecific cerebral dysfunction (encephalopathy). There was no seizure or seizure  predisposition recorded on this study. Please note that lack of epileptiform activity on EEG does not preclude the possibility of epilepsy. Roland Rack, MD [facility] [phone] If 7pm- 7am, please page neurology on call as listed in [place].        Scheduled Meds:  aspirin  81 mg Oral BID   cholecalciferol  1,000 Units Oral Daily   docusate sodium  100 mg Oral BID   feeding supplement (NEPRO CARB STEADY)  237 mL Oral BID BM   ferrous sulfate  325 mg Oral Q breakfast   mouth rinse  15 mL Mouth Rinse BID   multivitamin with minerals  1 tablet Oral Daily   pantoprazole (PROTONIX) IV  40 mg Intravenous QAC breakfast   QUEtiapine  50 mg Oral QHS   senna  1 tablet Oral BID   sertraline  25 mg Oral Daily   traMADol  25 mg Oral Q6H   Continuous Infusions:  sodium chloride Stopped (06/12/21 1613)   ampicillin-sulbactam (UNASYN) IV 3 g (06/14/21 1409)   methocarbamol (ROBAXIN) IV 500 mg (06/13/21 0002)   promethazine (PHENERGAN) injection (IM or IVPB) 6.25 mg (06/07/21 0921)     LOS: 8 days    Time spent: 28 minutes    Sharen Hones, MD [facility]   To contact the attending provider between 7A-7P or the covering provider during after hours 7P-7A, please log into the web site [email or web address] and access using universal [place] password for that web site. If you do not have the password, please call the hospital operator.  06/14/2021, 2:21 PM

## 2021-06-14 NOTE — Progress Notes (Signed)
Subjective:  POD #7 s/p intramedullary fixation for right intertrochanteric hip fracture.   Patient more alert today.  Patient reports right hip pain as mild.  Patient's family is at the bedside.  Objective:   VITALS:   Vitals:   06/13/21 2050 06/14/21 0559 06/14/21 0740 06/14/21 1115  BP: 123/68 126/65 133/65 120/63  Pulse: 80 73 74 75  Resp: 17 17 16 20   Temp: 98.6 F (37 C) 98.2 F (36.8 C) 97.7 F (36.5 C) 98.5 F (36.9 C)  TempSrc:   Oral   SpO2: 98% 98% 99% 100%  Weight:      Height:        PHYSICAL EXAM: Right lower extremity: Neurovascular intact Sensation intact distally Intact pulses distally Dorsiflexion/Plantar flexion intact Incision: dressing C/D/I No cellulitis present Compartment soft  LABS  Results for orders placed or performed during the hospital encounter of 06/06/21 (from the past 24 hour(s))  Comprehensive metabolic panel     Status: Abnormal   Collection Time: 06/14/21  2:41 AM  Result Value Ref Range   Sodium 139 135 - 145 mmol/L   Potassium 3.7 3.5 - 5.1 mmol/L   Chloride 110 98 - 111 mmol/L   CO2 26 22 - 32 mmol/L   Glucose, Bld 108 (H) 70 - 99 mg/dL   BUN 32 (H) 8 - 23 mg/dL   Creatinine, Ser 0.84 0.61 - 1.24 mg/dL   Calcium 8.1 (L) 8.9 - 10.3 mg/dL   Total Protein 5.2 (L) 6.5 - 8.1 g/dL   Albumin 2.2 (L) 3.5 - 5.0 g/dL   AST 22 15 - 41 U/L   ALT 14 0 - 44 U/L   Alkaline Phosphatase 84 38 - 126 U/L   Total Bilirubin 1.0 0.3 - 1.2 mg/dL   GFR, Estimated >60 >60 mL/min   Anion gap 3 (L) 5 - 15  CBC     Status: Abnormal   Collection Time: 06/14/21  2:41 AM  Result Value Ref Range   WBC 10.8 (H) 4.0 - 10.5 K/uL   RBC 2.82 (L) 4.22 - 5.81 MIL/uL   Hemoglobin 8.1 (L) 13.0 - 17.0 g/dL   HCT 25.5 (L) 39.0 - 52.0 %   MCV 90.4 80.0 - 100.0 fL   MCH 28.7 26.0 - 34.0 pg   MCHC 31.8 30.0 - 36.0 g/dL   RDW 15.0 11.5 - 15.5 %   Platelets 284 150 - 400 K/uL   nRBC 0.0 0.0 - 0.2 %  Magnesium     Status: None   Collection Time: 06/14/21   2:41 AM  Result Value Ref Range   Magnesium 2.2 1.7 - 2.4 mg/dL    CT HEAD WO CONTRAST (5MM)  Result Date: 06/12/2021 CLINICAL DATA:  Mental status change. Cerebral hemorrhage suspected. EXAM: CT HEAD WITHOUT CONTRAST TECHNIQUE: Contiguous axial images were obtained from the base of the skull through the vertex without intravenous contrast. COMPARISON:  MR head with contrast 06/12/2021 FINDINGS: Brain: Moderate atrophy and white matter changes are again seen. No acute parenchymal infarct or hemorrhage is present. The ventricles are proportionate to the degree of atrophy. Thin extra-axial collections are again noted. Collections measure a 3-4 mm on coronal imaging. No acute hyperdense blood is present. Hazy densities are present bilaterally. No significant mass effect or midline shift is present. Vascular: Atherosclerotic calcifications are again noted within the cavernous internal carotid arteries bilaterally. No hyperdense vessel present. Skull: Calvarium is intact. No focal lytic or blastic lesions are present. No significant extracranial soft  tissue lesion is present. Sinuses/Orbits: The paranasal sinuses and mastoid air cells are clear. The globes and orbits are within normal limits. IMPRESSION: 1. Stable subacute subdural hematomas bilaterally without significant mass effect or midline shift. No hyperdense blood products are present. 2. Stable atrophy and white matter disease. 3. No other acute intracranial abnormality or significant interval change. No focal parenchymal changes from the prior studies. Electronically Signed   By: San Morelle M.D.   On: 06/12/2021 15:01   MR BRAIN WO CONTRAST  Result Date: 06/12/2021 CLINICAL DATA:  Mental status change, unknown cause. Additional history provided: Mental status change last night status post hip surgery. EXAM: MRI HEAD WITHOUT CONTRAST TECHNIQUE: Multiplanar, multiecho pulse sequences of the brain and surrounding structures were obtained  without intravenous contrast. COMPARISON:  Prior head CT examinations 06/06/2021 and earlier. Brain MRI 09/12/2019. FINDINGS: Brain: Moderate cerebral atrophy. Comparatively mild cerebellar atrophy. There are small subdural collections overlying the bilateral cerebral hemispheres, measuring up to 4 mm in thickness on the right, and 5 mm in thickness on the left. The subdural collections are mildly hyperintense relative to CSF on the axial T2 FLAIR sequence. There are small scattered foci of SWI signal loss along the bilateral cerebral convexities, suggesting subdural blood products. There is minimal, if any, mass effect upon the underlying cerebral hemispheres. No midline shift. Redemonstrated chronic lacunar infarct within the right thalamus. Moderate multifocal T2 FLAIR hyperintense signal abnormality within the cerebral white matter, nonspecific but compatible with chronic small vessel ischemic disease. Minimal chronic small-vessel ischemic changes are also present within the pons. Redemonstrated small chronic infarcts within the left cerebellar hemisphere. There is no acute infarct. No evidence of an intracranial mass. Vascular: Maintained flow voids within the proximal large arterial vessels. Skull and upper cervical spine: No focal suspicious marrow lesion. Sinuses/Orbits: Visualized orbits show no acute finding. No significant paranasal sinus disease. Other: Left mastoid effusion. These results were called by telephone at the time of interpretation on 06/12/2021 at 2:17 pm to provider [facility] , who verbally acknowledged these results. IMPRESSION: Small subdural collections overlying the bilateral cerebral hemispheres, measuring up to 4 mm in thickness on the right and 5 mm in thickness on the left, as described. These collections were not appreciated on the prior head CT of 06/04/2021, and the findings are suspicious for acute subdural hematomas. A noncontrast head CT is recommended for further  evaluation. Minimal, if any, mass effect upon the underlying cerebral hemispheres. No midline shift. Redemonstrated chronic lacunar infarct within the right thalamus. Chronic small-vessel ischemic changes which are moderate in the cerebral white matter, and mild in the pons, stable from the brain MRI of 09/12/2019. Redemonstrated small chronic infarcts within the left cerebellar hemisphere. Moderate cerebral atrophy. Comparatively mild cerebellar atrophy. Left mastoid effusion. Electronically Signed   By: Kellie Simmering D.O.   On: 06/12/2021 14:18   EEG adult  Result Date: 06/13/2021 Greta Doom, MD     06/13/2021  3:21 PM History: 87 year old male being evaluated for altered mental status Sedation: None Technique: This EEG was acquired with electrodes placed according to the International 10-20 electrode system (including Fp1, Fp2, F3, F4, C3, C4, P3, P4, O1, O2, T3, T4, T5, T6, A1, A2, Fz, Cz, Pz). The following electrodes were missing or displaced: none. Background: There is slowing of the background activities with a posterior dominant rhythm achieving 7 Hz as well as some normal-appearing frontally predominant beta.  In addition, there are irregular generalized theta and delta range activities  intruding into the background. Photic stimulation: Physiologic driving is present EEG Abnormalities: 1) generalized irregular slow activity 2) slow posterior dominant rhythm Clinical Interpretation: This EEG is consistent with a mild generalized nonspecific cerebral dysfunction (encephalopathy). There was no seizure or seizure predisposition recorded on this study. Please note that lack of epileptiform activity on EEG does not preclude the possibility of epilepsy. Roland Rack, MD [facility] [phone] If 7pm- 7am, please page neurology on call as listed in [place].    Assessment/Plan: 7 Days Post-Op   Principal Problem:   Right femoral fracture (HCC) Active Problems:    Hypercholesterolemia   Benign prostatic hyperplasia   HTN (hypertension)   GERD (gastroesophageal reflux disease)   Leucocytosis   Acute hypoxemic respiratory failure (HCC)   Aspiration pneumonia of both lower lobes due to gastric secretions (HCC)   Dementia without behavioral disturbance ([place])  Patient stable from orthopedic standpoint.  Continue physical therapy as the patient can participate.  Patient is ready for discharge to a skilled nursing facility from an orthopedic standpoint once cleared medically.  Patient will follow up with me in the office in 10 to 14 days.  I will defer to anticoagulation management to the hospital service.  Normally from an orthopedic standpoint the patient would receive aspirin 325 mg p.o. twice daily for DVT prophylaxis x4 to 6 weeks.    Thornton Park , MD 06/14/2021, 1:51 PM

## 2021-06-14 NOTE — Progress Notes (Addendum)
Speech Language Pathology Treatment: Dysphagia  Patient Details Name: Todd Trujillo. MRN: [id number] DOB: 1930-03-25 Today's Date: 06/14/2021 Time: 1497-0263 SLP Time Calculation (min) (ACUTE ONLY): 40 min  Assessment / Plan / Recommendation Clinical Impression  Pt seen for ongoing assessment of swallowing. He appears improved even from yesterday's tx session w/ less congested coughing and expectoration of Phlegm. He was sitting calmly in bed; more alert and verbally responsive w/ Dtr and this Clinician. He engaged in basic conversation w/ SLP; min HOH. Pt has Baseline Dementia.  Pt is on [place] O2 support; Denture plate was NOT placed for this session/these po's. This was discussed w/ [name] d/t pt reporting he felt gum soreness/discomfort when wearing the Denture plate. Upon observation of the Palate, no redness or sore area appreciated.   [name] explained general aspiration precautions and agreed verbally to the need for following them especially sitting upright for all oral intake -- supported behind the back for full upright, midline sitting. Pt assisted w/ positioning d/t min weakness. He fed himself sips of Nectar liquids VIA CUP w/ Dtr supporting feeding of Puree foods from the breakfast meal. Pt followed instructions/cues to alternate bites and sips w/ little/no cueing needed -- good follow through. No overt clinical s/s of aspiration were noted w/ either consistency initially during the meal; respiratory status remained calm and unlabored, vocal quality mostly clear b/t trials. Pt did not seem as bothered by extra Phelgm and exhibited no expectoration of Phlegm initially. Towards the end of the po's/meal, noted pt had a mildly congested cough x2 but also expectorated Phlegm x1/2 episodes. Noted pt's IV fluids were reduced from yesterday. Oral phase appeared grossly [facility] for bolus management and timely A-P transfer for swallowing; oral clearing achieved w/ consistencies. Pt seemed to  slow in his oral manipulation and management after ~20-25 mins of eating -- suspect a Fatigue Factor. Discussed this w/ Dtr present and encouraged Rest Breaks during meals.    Pt appears at risk for aspiration d/t Baseline Cognitive decline/Dementia, deconditioned status s/p traumatic Fall/hip fx and surgical repair, and Dentition status. This risk for aspiration is reduced when following aspiration precautions and following a dysphagia diet. Recommend continue a Dysphagia level 1 (puree) diet for ease of foods, gravies added to moisten foods; Nectar liquids VIA CUP. Recommend 100% supervision and support w/ meals to assist in the feeding; Rest Breaks when needed. Recommend Pills CRUSHED in Puree. ST services will continue to follow during admit; recommend continued f/u at D/C to SNF w/ potential need for MBSS in future when pt's overall medical status is improved to consider diet consistency upgrade. Recommend Palliative Care f/u for [place]; Dietician f/u for support of supplements. Precautions posted at bedside. NSG/MD updated.     HPI HPI: Pt is a Todd Trujillo with medical hx significant for dementia, hyperlipidemia, HTN, macular degeneration of the eyes, BPH, who had a mechanical fall in his home that resulted in a R femoral neck fracture. Pt is now s/p R intramedullary fixation. MRI revealed chronic atrophic and ischemic changes stable from the prior study. Head CT w/ Contrast performed d/t mental status changes: "Moderate atrophy and white matter changes are again seen. No  acute parenchymal infarct or hemorrhage is present. Stable subacute subdural hematomas bilaterally without  significant mass effect or midline shift. No hyperdense blood  products are present.".      SLP Plan  Continue with current plan of care (mbss TBD in future)      Recommendations for  follow up therapy are one component of a multi-disciplinary discharge planning process, led by the attending physician.  Recommendations may be  updated based on patient status, additional functional criteria and insurance authorization.    Recommendations  Diet recommendations: Dysphagia 1 (puree);Nectar-thick liquid Liquids provided via: Cup;No straw Medication Administration: Crushed with puree (for safer swallowing) Supervision: Patient able to self feed;Staff to assist with self feeding;Full supervision/cueing for compensatory strategies Compensations: Minimize environmental distractions;Slow rate;Small sips/bites;Lingual sweep for clearance of pocketing;Multiple dry swallows after each bite/sip;Follow solids with liquid;Clear throat intermittently Postural Changes and/or Swallow Maneuvers: Out of bed for meals;Seated upright 90 degrees;Upright 30-60 min after meal                General recommendations:  (Dietician f/u; Palliative Care f/u) Oral Care Recommendations: Oral care BID;Oral care before and after PO;Staff/trained caregiver to provide oral care Follow Up Recommendations: Skilled nursing-short term rehab (<3 hours/day) Assistance recommended at discharge: Frequent or constant Supervision/Assistance SLP Visit Diagnosis: Dysphagia, oropharyngeal phase (R13.12) (baseline Dementia; Cognitive decline) Plan: Continue with current plan of care (mbss TBD in future)       GO                   [name], [place], CCC-SLP Speech Language Pathologist Rehab Services [phone] [facility]  06/14/2021, 1:56 PM

## 2021-06-14 NOTE — Progress Notes (Signed)
Pharmacy Antibiotic Note  Todd Trujillo. is a 87 y.o. male with medical history significant of dementia, HLD, HTN, macular degeneration of the eye, BPH, squamous cell carcinoma of the skin who came in with right femoral neck fracture. Pharmacy has been consulted for Unasyn dosing for aspiration pneumonia.  Plan: Day 4- Unasyn 3 g IV q6h Monitor renal function and adjust dose as clinically indicated  Height: 5\' 11"  (180.3 cm) Weight: 69.7 kg (153 lb 9.6 oz) IBW/kg (Calculated) : 75.3  Temp (24hrs), Avg:98.1 F (36.7 C), Min:97.7 F (36.5 C), Max:98.6 F (37 C)  Recent Labs  Lab 06/11/21 0636 06/11/21 1628 06/12/21 0622 06/13/21 0405 06/14/21 0241  WBC 10.1 10.0 15.0* 12.5* 10.8*  CREATININE 0.68 0.83 1.04 0.89 0.84     Estimated Creatinine Clearance: 58.8 mL/min (by C-G formula based on SCr of 0.84 mg/dL).    Allergies  Allergen Reactions   Codeine Nausea And Vomiting and Nausea Only    Other reaction(s): Vomiting    Antimicrobials this admission: 11/27 Unasyn >>   Microbiology results: 11/23 MRSA PCR: not detected  Thank you for allowing pharmacy to be a part of this patient's care.  Todd Trujillo A 06/14/2021 10:44 AM

## 2021-06-14 NOTE — Progress Notes (Signed)
Nutrition Follow-up  DOCUMENTATION CODES:   Not applicable  INTERVENTION:   -D/c Ensure  -Nepro Shake po BID, each supplement provides 425 kcal and 19 grams protein  -Continue MVI with minerals daily -Continue Magic cup TID with meals, each supplement provides 290 kcal and 9 grams of protein   NUTRITION DIAGNOSIS:   Increased nutrient needs related to post-op healing as evidenced by estimated needs.  Ongoing  GOAL:   Patient will meet greater than or equal to 90% of their needs  Progressing   MONITOR:   PO intake, Supplement acceptance, Diet advancement, Labs, Weight trends, Skin, I & O's  REASON FOR ASSESSMENT:   Malnutrition Screening Tool    ASSESSMENT:   Todd Trujillo. is a 87 y.o. male with medical history significant of dementia, hyperlipidemia, hypertension, macular degeneration of the eye, BPH, squamous cell carcinoma of the skin who had a mechanical fall at home and came in with right femoral neck fracture.  Patient apparently tripped and fell landing on the right side.  Denied hitting his head.  Denied any dizziness prior to the event denied any other significant injury.  Patient was seen and evaluated in the ER.  Work-up showed right intertrochanteric femur fracture.  No other fractures or injury.  Patient appears hemodynamically stable except for the pain.  Orthopedic consulted and patient being admitted for operative repair.  Patient denied any other complaints at the moment.  11/23- s/p PROCEDURE: Intramedullary fixation of right intertrochanteric hip fracture 11/28 MRI brain reveals subdural hematomas 11/30- downgraded to dysphagia 1 diet with nectar thick liquids  Reviewed I/O's: -135 ml x 24 hours and -655 ml since admission  UOP: 700 ml x 24 hours   Case discussed with SLP; pt has been downgraded to a dysphagia 1 diet with nectar thick liquids. Pt likes the pureed foods and is consuming about 50% of his meals. Pt is requesting a nectar  thick supplement.   Palliative care following for [place] discussions; plan to d/c to SNF for short term rehab since medically stable.   Medications reviewed and include vitamin D3, colace, and senokot.   Labs reviewed.   Diet Order:   Diet Order             DIET - DYS 1 Room service appropriate? Yes with Assist; Fluid consistency: Nectar Thick  Diet effective now                   EDUCATION NEEDS:   No education needs have been identified at this time  Skin:  Skin Assessment: Skin Integrity Issues: Skin Integrity Issues:: Incisions Incisions: closed rt hip, rt lateral thigh  Last BM:  06/12/21  Height:   Ht Readings from Last 1 Encounters:  06/07/21 5\' 11"  (1.803 m)    Weight:   Wt Readings from Last 1 Encounters:  06/07/21 69.7 kg    Ideal Body Weight:  78.2 kg  BMI:  Body mass index is 21.42 kg/m.  Estimated Nutritional Needs:   Kcal:  1900-2100  Protein:  90-105 grams  Fluid:  > 1.9 L    Loistine Chance, RD, LDN, [place] Registered Dietitian II Certified Diabetes Care and Education Specialist Please refer to [facility] for RD and/or RD on-call/weekend/after hours pager

## 2021-06-14 NOTE — Progress Notes (Signed)
Physical Therapy Treatment Patient Details Name: Todd Trujillo. MRN: [id number] DOB: 1930/02/01 Today's Date: 06/14/2021   History of Present Illness Pt is a 87 y/o male with medical hx significant for dementia, hyperlipidemia, HTN, macular degeneration of the eyes, BPH, who had a mechanical fall in his home that resulted in a R femoral neck fracture. Pt is now s/p R intramedullary fixation.    PT Comments    Pt received supine in bed, family and RN in room reporting pt had just asked if he could move around. Pt continues to require assist of 2 people for bed level and standing mobility. Pt attempts to participate with mobility however required MAX Ax2 throughout most of session. Prolonged time spent in standing (>5 minutes). Pt denying pain upon PT arrival and departure. Pt family concerned about transfer back to bed - PT provided phone number to assist with transfer this evening. Would benefit from skilled PT to address above deficits and promote optimal return to PLOF.     Recommendations for follow up therapy are one component of a multi-disciplinary discharge planning process, led by the attending physician.  Recommendations may be updated based on patient status, additional functional criteria and insurance authorization.  Follow Up Recommendations  Skilled nursing-short term rehab (<3 hours/day)     Assistance Recommended at Discharge Frequent or constant Supervision/Assistance  Equipment Recommendations  None recommended by PT    Recommendations for Other Services       Precautions / Restrictions Precautions Precautions: Fall Precaution Comments: s/p intramedullary fixation for right intertrochanteric hip fracture Restrictions Weight Bearing Restrictions: Yes RLE Weight Bearing: Weight bearing as tolerated     Mobility  Bed Mobility Overal bed mobility: Needs Assistance Bed Mobility: Supine to Sit     Supine to sit: HOB elevated;+2 for physical  assistance;Max assist     General bed mobility comments: MAX A to manage BLE and trunk. Pt did assist with LLE management.    Transfers Overall transfer level: Needs assistance Equipment used: Rolling walker (2 wheels) Transfers: Sit to/from Stand Sit to Stand: Max assist;+2 physical assistance Stand pivot transfers: Max assist;+2 safety/equipment;Min guard;+2 physical assistance         General transfer comment: MAX Ax2 to lift, steady and manage RW during STS. MAX and MIN A provided during stand pivot (MIN A for safety due to difficulty processing VC)    Ambulation/Gait               General Gait Details: deferred for safety   Stairs             Wheelchair Mobility    Modified Rankin (Stroke Patients Only)       Balance Overall balance assessment: Needs assistance Sitting-balance support: Bilateral upper extremity supported;Feet supported Sitting balance-Leahy Scale: Fair Sitting balance - Comments: CGA provided for safety, no apparent LOB Postural control: Left lateral lean   Standing balance-Leahy Scale: Poor Standing balance comment: MAX A and BUE support on RW                            Cognition Arousal/Alertness: Awake/alert Behavior During Therapy: Flat affect Overall Cognitive Status: History of cognitive impairments - at baseline                                 General Comments: Delayed processing.        Exercises  General Exercises - Lower Extremity Ankle Circles/Pumps: AROM;Strengthening;Both;Seated;20 reps Gluteal Sets: Strengthening;Both;20 reps;Seated Long Arc Quad: AROM;Strengthening;Both;Seated;20 reps Hip Flexion/Marching: AROM;Strengthening;Both;10 reps;Seated Other Exercises Other Exercises: Supine>sit, sitting balance, STS, static standing (BM in standing, CNA assisted with clean-up), stand pivot transfer, LE therex. PT delivered more appropriate RW to pt room after session for transfer back to bed  later.    General Comments        Pertinent Vitals/Pain Pain Assessment: No/denies pain    Home Living                          Prior Function            PT Goals (current goals can now be found in the care plan section) Acute Rehab PT Goals Patient Stated Goal: go home PT Goal Formulation: With patient/family Time For Goal Achievement: 06/22/21 Potential to Achieve Goals: Fair    Frequency    7X/week      PT Plan      Co-evaluation              AM-PAC PT "6 Clicks" Mobility   Outcome Measure  Help needed turning from your back to your side while in a flat bed without using bedrails?: A Lot Help needed moving from lying on your back to sitting on the side of a flat bed without using bedrails?: A Lot Help needed moving to and from a bed to a chair (including a wheelchair)?: A Lot Help needed standing up from a chair using your arms (e.g., wheelchair or bedside chair)?: A Lot Help needed to walk in hospital room?: Total Help needed climbing 3-5 steps with a railing? : Total 6 Click Score: 10    End of Session Equipment Utilized During Treatment: Gait belt;Oxygen Activity Tolerance: Patient tolerated treatment well;Patient limited by fatigue Patient left: in chair;with call bell/phone within reach;with chair alarm set;with family/visitor present Nurse Communication: Mobility status PT Visit Diagnosis: Muscle weakness (generalized) (M62.81);History of falling (Z91.81);Other abnormalities of gait and mobility (R26.89);Unsteadiness on feet (R26.81)     Time: 3559-7416 PT Time Calculation (min) (ACUTE ONLY): 38 min  Charges:  $Therapeutic Exercise: 8-22 mins $Therapeutic Activity: 23-37 mins                     Patrina Levering PT, DPT 06/14/21 3:35 PM [phone]

## 2021-06-14 NOTE — Care Management Important Message (Signed)
Important Message  Patient Details  Name: Todd Trujillo. MRN: [id number] Date of Birth: 1929/08/06   Medicare Important Message Given:  Yes     Juliann Pulse A Telma Pyeatt 06/14/2021, 10:51 AM

## 2021-06-14 NOTE — Progress Notes (Signed)
Occupational Therapy Treatment Patient Details Name: Todd Trujillo. MRN: [id number] DOB: [birth year] Today's Date: 06/14/2021   History of present illness Pt is a 87 y/o male with medical hx significant for dementia, hyperlipidemia, HTN, macular degeneration of the eyes, BPH, who had a mechanical fall in his home that resulted in a R femoral neck fracture. Pt is now s/p R intramedullary fixation.   OT comments  Upon entering the room, pt seated in recliner chair with family present in the room. Pt has recently worked with PT prior to therapist arrival but is agreeable to OT intervention with focus on B UE strengthening with use of yellow resistive theraband. Pt performing 2 sets of 10 lateral pull downs and bicep curls with min cuing for technique and hand placement. Pt then begins to fall asleep and repots fatigue from today's events and requests to rest. OT left theraband with pt and family member in the room to utilize when pt is more alert and they were agreeable. All needs within reach.    Recommendations for follow up therapy are one component of a multi-disciplinary discharge planning process, led by the attending physician.  Recommendations may be updated based on patient status, additional functional criteria and insurance authorization.    Follow Up Recommendations  Skilled nursing-short term rehab (<3 hours/day)    Assistance Recommended at Discharge Frequent or constant Supervision/Assistance  Equipment Recommendations  Other (comment) (defer to next venue of care)       Precautions / Restrictions Precautions Precautions: Fall Precaution Comments: s/p intramedullary fixation for right intertrochanteric hip fracture Restrictions Weight Bearing Restrictions: Yes RLE Weight Bearing: Weight bearing as tolerated LLE Weight Bearing: Weight bearing as tolerated       Mobility Bed Mobility Overal bed mobility: Needs Assistance Bed Mobility: Supine to Sit      Supine to sit: HOB elevated;+2 for physical assistance;Max assist     General bed mobility comments: seated in recliner chair    Transfers Overall transfer level: Needs assistance Equipment used: Rolling walker (2 wheels) Transfers: Sit to/from Stand Sit to Stand: Max assist;+2 physical assistance Stand pivot transfers: Max assist;+2 safety/equipment;Min guard;+2 physical assistance         General transfer comment: MAX Ax2 to lift, steady and manage RW during STS. MAX and MIN A provided during stand pivot (MIN A for safety due to difficulty processing VC)     Balance Overall balance assessment: Needs assistance Sitting-balance support: Bilateral upper extremity supported;Feet supported Sitting balance-Leahy Scale: Fair Sitting balance - Comments: CGA provided for safety, no apparent LOB Postural control: Left lateral lean   Standing balance-Leahy Scale: Poor Standing balance comment: MAX A and BUE support on RW                           ADL either performed or assessed with clinical judgement    Extremity/Trunk Assessment Upper Extremity Assessment Upper Extremity Assessment: Generalized weakness   Lower Extremity Assessment Lower Extremity Assessment: Generalized weakness        Vision Patient Visual Report: No change from baseline            Cognition Arousal/Alertness: Awake/alert Behavior During Therapy: Flat affect Overall Cognitive Status: History of cognitive impairments - at baseline                                 General Comments: Delayed processing.  Exercises General Exercises - Lower Extremity Ankle Circles/Pumps: AROM;Strengthening;Both;Seated;20 reps Gluteal Sets: Strengthening;Both;20 reps;Seated Long Arc Quad: AROM;Strengthening;Both;Seated;20 reps Hip Flexion/Marching: AROM;Strengthening;Both;10 reps;Seated Other Exercises Other Exercises: Supine>sit, sitting balance, STS, static standing (BM in  standing, CNA assisted with clean-up), stand pivot transfer, LE therex. PT delivered more appropriate RW to pt room after session for transfer back to bed later.           Pertinent Vitals/ Pain       Pain Assessment: No/denies pain         Frequency  Min 2X/week        Progress Toward Goals  OT Goals(current goals can now be found in the care plan section)  Progress towards OT goals: Progressing toward goals  Acute Rehab OT Goals Patient Stated Goal: to get better and stronger OT Goal Formulation: With patient/family Time For Goal Achievement: 06/23/21 Potential to Achieve Goals: Good  Plan Discharge plan remains appropriate;Frequency remains appropriate       AM-PAC OT "6 Clicks" Daily Activity     Outcome Measure   Help from another person eating meals?: A Little Help from another person taking care of personal grooming?: A Little Help from another person toileting, which includes using toliet, bedpan, or urinal?: A Lot Help from another person bathing (including washing, rinsing, drying)?: A Lot Help from another person to put on and taking off regular upper body clothing?: A Little Help from another person to put on and taking off regular lower body clothing?: A Lot 6 Click Score: 15    End of Session    OT Visit Diagnosis: Other abnormalities of gait and mobility (R26.89);Muscle weakness (generalized) (M62.81)   Activity Tolerance Patient tolerated treatment well   Patient Left with call bell/phone within reach;with family/visitor present;in chair;with chair alarm set   Nurse Communication Mobility status        Time: 5456-2563 OT Time Calculation (min): 12 min  Charges: OT General Charges $OT Visit: 1 Visit OT Treatments $Therapeutic Exercise: 8-22 mins  Darleen Crocker, MS, OTR/L , CBIS ascom [phone]  06/14/21, 3:51 PM

## 2021-06-14 NOTE — Progress Notes (Signed)
Palliative Care Progress Note, Assessment & Plan   Patient Name: Todd Trujillo.       Date: 06/14/2021 DOB: October 17, 1929  Age: 87 y.o. MRN#: [id number] Attending Physician: Sharen Hones, MD Primary Care Physician: Einar Pheasant, MD Admit Date: 06/06/2021  Reason for Consultation/Follow-up: Establishing goals of care  Subjective: Patient is sitting up in bed.  He is having a coughing spell.  He appears to be choking in his face became red.  However he was able to clear his secretion.  Secretion was of thick, white secretion that he spit into a cup.  His daughter Amy is at bedside.  She says he has been doing that the whole time and does not seem overly concerned about it.  HPI: Patient is an 87 year old male with a past medical history significant for dementia, HLD, HTN, macular degeneration, and history of prostate cancer status post prostatectomy, and squamous cell carcinoma of the skin admitted on 06/06/2021 with mechanical fall and subsequent surgical repair on 11/23.  On 11/27 patient experienced aspiration event with hypoxic respiratory failure.  Patient now remains on nasal cannula in no respiratory distress.    On 11/28 MRI of brain revealed subdural hematomas.  On 11/29 speech and language evaluation recommended a dysphagia level 1 diet and nectar thick liquids.  As per SLP notes patient appears to be at increased risk for dysphagia with aspiration.  Palliative medicine was consulted to discuss goals of care.  Plan of Care: I have reviewed medical records including EPIC notes, labs and imaging, assessed the patient and then met with patient and patient's daughter Amy at bedside to discuss diagnosis prognosis, GOC, EOL wishes, disposition, and options.  Amy shares plan is for  patient to go to rehab section of [facility] and then to return home.  Patient is agreeable to this.  He wants to go home but understands he needs to have more therapy to help him build his physical strength back up so that going home can be a safer option.  Discussed potential for patient to aspirate again in the future.  Shared my concerns that patient still remains a partial code.  Amy shares that continuing these conversations with her mother would be helpful.  Outpatient palliative services discussed.  Amy also shared that they should likely put a healthcare power of attorney in place for her dad.  I shared that outpatient palliative can also facilitate this advance care planning documentation.  Amy is familiar with this for a care and understands that this will be an extra layer support while the patient is not hospitalized.  I also shared that a sore care can help determine when patient could benefit from an acting hospice benefits.  Discussed with patient/family the importance of continued conversation with family and the medical providers regarding overall plan of care and treatment options, ensuring decisions are within the context of the patient's values and GOCs.    Questions and concerns were addressed. The family was encouraged to call with questions or concerns.  I advised patient and family that palliative medicine will continue to monitor the patient throughout his hospitalization.  However we will not intervene unless called or we note significant decline in  patient's status.  Amy was appreciative and has the PMT contact information should she need to be in touch with [place].  Care plan was discussed with patient, patient's daughter  Code Status: Limited code  Prognosis:  Unable to determine  Discharge Planning: [facility] for rehab with Palliative care service follow-up  Recommendations/Plan: Continue code status discussions with family via [employer]. Palliative  Referral  Physical Exam Constitutional:      Appearance: Normal appearance.  HENT:     Head: Normocephalic and atraumatic.     Mouth/Throat:     Mouth: Mucous membranes are moist.  Cardiovascular:     Rate and Rhythm: Normal rate.     Pulses: Normal pulses.  Pulmonary:     Effort: Pulmonary effort is normal.  Abdominal:     Palpations: Abdomen is soft.  Musculoskeletal:     Comments: Generalized weakness  Skin:    General: Skin is warm and dry.  Neurological:     Mental Status: He is alert. Mental status is at baseline.  Psychiatric:        Mood and Affect: Mood normal.        Behavior: Behavior normal.                Palliative Assessment/Data: 50%       Total Time 15 minutes  Greater than 50%  of this time was spent counseling and coordinating care related to the above assessment and plan.  Thank you for allowing the Palliative Medicine Team to assist in the care of this patient.  [place] Ilsa Iha, FNP-BC Palliative Medicine Team Team Phone # [phone]

## 2021-06-15 ENCOUNTER — Telehealth: Payer: Self-pay | Admitting: Internal Medicine

## 2021-06-15 LAB — CBC WITH DIFFERENTIAL/PLATELET
Abs Immature Granulocytes: 0.13 10*3/uL — ABNORMAL HIGH (ref 0.00–0.07)
Basophils Absolute: 0.1 10*3/uL (ref 0.0–0.1)
Basophils Relative: 1 %
Eosinophils Absolute: 0.6 10*3/uL — ABNORMAL HIGH (ref 0.0–0.5)
Eosinophils Relative: 6 %
HCT: 26.4 % — ABNORMAL LOW (ref 39.0–52.0)
Hemoglobin: 8.4 g/dL — ABNORMAL LOW (ref 13.0–17.0)
Immature Granulocytes: 1 %
Lymphocytes Relative: 19 %
Lymphs Abs: 1.8 10*3/uL (ref 0.7–4.0)
MCH: 28.8 pg (ref 26.0–34.0)
MCHC: 31.8 g/dL (ref 30.0–36.0)
MCV: 90.4 fL (ref 80.0–100.0)
Monocytes Absolute: 1.1 10*3/uL — ABNORMAL HIGH (ref 0.1–1.0)
Monocytes Relative: 12 %
Neutro Abs: 5.8 10*3/uL (ref 1.7–7.7)
Neutrophils Relative %: 61 %
Platelets: 310 10*3/uL (ref 150–400)
RBC: 2.92 MIL/uL — ABNORMAL LOW (ref 4.22–5.81)
RDW: 15.1 % (ref 11.5–15.5)
WBC: 9.6 10*3/uL (ref 4.0–10.5)
nRBC: 0 % (ref 0.0–0.2)

## 2021-06-15 MED ORDER — TRAMADOL HCL 50 MG PO TABS
25.0000 mg | ORAL_TABLET | Freq: Four times a day (QID) | ORAL | 0 refills | Status: DC
Start: 2021-06-15 — End: 2021-08-19

## 2021-06-15 MED ORDER — AMOXICILLIN-POT CLAVULANATE 250-62.5 MG/5ML PO SUSR: 800.0000 mg | Freq: Two times a day (BID) | ORAL | 0 refills | Status: AC

## 2021-06-15 MED ORDER — NEPRO/CARBSTEADY PO LIQD
237.0000 mL | Freq: Two times a day (BID) | ORAL | 0 refills | Status: DC
Start: 2021-06-15 — End: 2022-07-23

## 2021-06-15 NOTE — Discharge Summary (Addendum)
Physician Discharge Summary  Patient ID: Koltin Wehmeyer. MRN: [id number] DOB/AGE: Feb 14, 1930 87 y.o.  Admit date: 06/06/2021 Discharge date: 06/15/2021  Admission Diagnoses:  Discharge Diagnoses:  Principal Problem:   Right femoral fracture ([place]) Active Problems:   Hypercholesterolemia   Benign prostatic hyperplasia   HTN (hypertension)   GERD (gastroesophageal reflux disease)   Leucocytosis   Acute hypoxemic respiratory failure ([facility])   Aspiration pneumonia of both lower lobes due to gastric secretions ([facility])   Dementia without behavioral disturbance ([place])   Discharged Condition: good  Hospital Course:  87 year old male with history of mild dementia, HTN, HLD, history of prostate cancer status post prostatectomy, comes into the hospital after having a mechanical fall at home, was found to have a right femoral neck fracture.  He tripped and landed on his right side.  No chest pain, syncope, loss of consciousness.  Denies hitting his head.  Orthopedic surgery consulted and he was admitted to the hospitalist service.  He is status post surgical repair 11/23 alert, remains confused.  Hard of hearing.  Hospital course complicated by worsening weakness, worsening dysphagia and an aspiration event on 11/27.  He also has worsening aphasia on 11/27.  Rapid response called the same evening due to hypoxic respiratory failure.  Hospital course also complicated by subacute subdural hematomas  Acute hypoxemic respiratory failure secondary to aspiration pneumonia Aspiration pneumonia Condition had improved, completed 4 days of Unasyn, will continue 3 days of Augmentin. Continue dysphagia 1 diet.  Please reassess by speech therapy, he may be able to advance diet. Patient currently off oxygen, condition improved. Patient also seen by palliative care due to aspiration.  Continue palliative care.    Subdural hematoma. Condition has been stable.  Mildly displaced intratrochanteric  fracture of the right femoral neck- Status post ORIF 11/23. Follow-up with orthopedics in 1 to 2 weeks  Acute blood loss anemia. Iron deficient anemia. Hemoglobin has been stabilized.  Consults: orthopedic surgery and Neurosurgery  Significant Diagnostic Studies:   Treatments: Hip  Discharge Exam: Blood pressure 130/76, pulse 77, temperature (!) 97.5 F (36.4 C), resp. rate 18, height 5\' 11"  (1.803 m), weight 69.7 kg, SpO2 98 %. General appearance: alert and cooperative Resp: clear to auscultation bilaterally Cardio: regular rate and rhythm, S1, S2 normal, no murmur, click, rub or gallop GI: soft, non-tender; bowel sounds normal; no masses,  no organomegaly Extremities: extremities normal, atraumatic, no cyanosis or edema  Disposition: Discharge disposition: 01-Home or Self Care       Discharge Instructions     Diet general   Complete by: As directed    Dysphagia 1 diet, nectar thick liquid.   Discharge wound care:   Complete by: As directed    Follow-up with orthopedics as outpatient.  Keep wound clean.   Increase activity slowly   Complete by: As directed       Allergies as of 06/15/2021       Reactions   Codeine Nausea And Vomiting, Nausea Only   Other reaction(s): Vomiting        Medication List     STOP taking these medications    lisinopril 20 MG tablet Commonly known as: ZESTRIL   mupirocin ointment 2 % Commonly known as: BACTROBAN       TAKE these medications    amoxicillin-clavulanate 250-62.5 MG/5ML suspension Commonly known as: AUGMENTIN Take 16 mLs (800 mg total) by mouth 2 (two) times daily for 3 days.   aspirin EC 81 MG tablet Take 1 tablet (  81 mg total) by mouth daily. Swallow whole.   cholecalciferol 25 MCG (1000 UNIT) tablet Commonly known as: VITAMIN D3 Take 1,000 Units by mouth daily.   feeding supplement (NEPRO CARB STEADY) Liqd Take 237 mLs by mouth 2 (two) times daily between meals.   ferrous sulfate 325 (65 FE) MG  tablet Take 325 mg by mouth daily with breakfast.   QUEtiapine 50 MG tablet Commonly known as: SEROquel Take 1 tablet (50 mg total) by mouth at bedtime.   sertraline 25 MG tablet Commonly known as: ZOLOFT TAKE 1 TABLET BY MOUTH DAILY.   traMADol 50 MG tablet Commonly known as: ULTRAM Take 0.5 tablets (25 mg total) by mouth every 6 (six) hours.               Discharge Care Instructions  (From admission, onward)           Start     Ordered   06/15/21 0000  Discharge wound care:       Comments: Follow-up with orthopedics as outpatient.  Keep wound clean.   06/15/21 2122            Follow-up Information     Einar Pheasant, MD Follow up in 1 week(s).   Specialty: Internal Medicine Contact information: [address] [place] [address] [phone]         Thornton Park, MD Follow up in 1 week(s).   Specialty: Orthopedic Surgery Contact information: [address] [place] [address] [phone]                32 minutes Signed: Sharen Hones 06/15/2021, 9:53 AM

## 2021-06-15 NOTE — Telephone Encounter (Signed)
Patient is being release from hospital to his house. Patient needs a 1 week follow up with his provider. No available appointments.

## 2021-06-15 NOTE — TOC Progression Note (Signed)
Transition of Care [facility]) - Progression Note    Patient Details  Name: Todd Trujillo. MRN: [id number] Date of Birth: 1929/10/16  Transition of Care [facility]) CM/SW [place], RN Phone Number: 06/15/2021, 10:38 AM  Clinical Narrative:   Patient going to [facility] room E7, family at the bedside aware, EMS called, nurse to call report         Expected Discharge Plan and Services           Expected Discharge Date: 06/15/21                                     Social Determinants of Health (SDOH) Interventions    Readmission Risk Interventions No flowsheet data found.

## 2021-06-16 NOTE — Telephone Encounter (Signed)
Will schedule f/u when discharged from skilled nursing facility.

## 2021-06-21 ENCOUNTER — Ambulatory Visit: Payer: No Typology Code available for payment source | Admitting: Dermatology

## 2021-06-23 ENCOUNTER — Inpatient Hospital Stay: Payer: No Typology Code available for payment source | Attending: Internal Medicine | Admitting: Internal Medicine

## 2021-06-23 ENCOUNTER — Inpatient Hospital Stay: Payer: No Typology Code available for payment source

## 2021-07-06 ENCOUNTER — Other Ambulatory Visit: Payer: Self-pay | Admitting: Family Medicine

## 2021-07-06 DIAGNOSIS — R059 Cough, unspecified: Secondary | ICD-10-CM

## 2021-07-06 DIAGNOSIS — R1310 Dysphagia, unspecified: Secondary | ICD-10-CM

## 2021-07-06 DIAGNOSIS — T17908A Unspecified foreign body in respiratory tract, part unspecified causing other injury, initial encounter: Secondary | ICD-10-CM

## 2021-07-24 ENCOUNTER — Ambulatory Visit
Admission: RE | Admit: 2021-07-24 | Discharge: 2021-07-24 | Disposition: A | Discharge status: Home / Self Care | Payer: Medicare Other | Source: Ambulatory Visit | Attending: Family Medicine | Admitting: Family Medicine

## 2021-07-24 ENCOUNTER — Encounter: Payer: Self-pay | Admitting: Internal Medicine

## 2021-07-24 DIAGNOSIS — R1312 Dysphagia, oropharyngeal phase: Secondary | ICD-10-CM | POA: Insufficient documentation

## 2021-07-24 DIAGNOSIS — R1310 Dysphagia, unspecified: Secondary | ICD-10-CM | POA: Diagnosis present

## 2021-07-24 DIAGNOSIS — R059 Cough, unspecified: Secondary | ICD-10-CM | POA: Diagnosis present

## 2021-07-24 NOTE — Therapy (Signed)
[facility] [place], [place], [address] Phone: [phone]   Fax:     Modified Barium Swallow  Patient Details  Name: Todd Trujillo. MRN: [id number] Date of Birth: 09-28-1929 No data recorded  Encounter Date: 07/24/2021   End of Session - 07/24/21 1452     Visit Number 1    Number of Visits 1    Date for SLP Re-Evaluation 07/24/21    SLP Start Time 23    SLP Stop Time  1330    SLP Time Calculation (min) 60 min    Activity Tolerance Patient tolerated treatment well              There were no vitals filed for this visit.   Subjective Assessment - 07/24/21 1402     Subjective Arrives with daughter; attempted FEES at facility but pt could not tolerate    Currently in Pain? No/denies            Objective Swallowing Evaluation: Type of Study: MBS-Modified Barium Swallow Study   Patient Details  Name: Todd Trujillo. MRN: [id number] Date of Birth: Feb 29, 1930  Today's Date: 07/24/2021 Time: SLP Start Time (ACUTE ONLY): 1230 -SLP Stop Time (ACUTE ONLY): 1330  SLP Time Calculation (min) (ACUTE ONLY): 60 min   Past Medical History:  Past Medical History:  Diagnosis Date   Arthritis    Cancer ([place])    prostate   Hyperlipidemia    Hypertension    Left wrist fracture    Macular degeneration of right eye    Nephrolithiasis    Prostate CA ([place]) 2003   Prostate troubles    Patient was unsure of term   Squamous cell carcinoma of skin 01/23/2017   R distal lat bicep near elbow   Past Surgical History:  Past Surgical History:  Procedure Laterality Date   INTRAMEDULLARY (IM) NAIL INTERTROCHANTERIC Right 06/07/2021   Procedure: INTRAMEDULLARY (IM) NAIL INTERTROCHANTRIC;  Surgeon: Thornton Park, MD;  Location: [facility] ORS;  Service: Orthopedics;  Laterality: Right;   TOTAL KNEE ARTHROPLASTY     HPI: Patient is an 88 y.o. male referred from his SNF for MBS for possible  aspiration. Patient was recently hospitalized at [facility] for right femoral fracture 06/06/21-06/15/21 s/p fall at home, with hospital course complicated by subacute subdural hematomas and aspiration event (vomited), aspiration PNA. He was evaluated by ST during admission with recommendation for dysphagia 1 (puree) diet with nectar thick liquids with subsequent d/c to SNF. Past medical history also includes: GERD, dementia, CVA, HTN, HLD, prostate cancer. Daughter reports FEES attempted at facility however assessment was limited because pt pulled tube. Pt has been consuming thin liquids and puree at facility per daughter, with family acknowledging aspiration risk. Daughter also reports pt weight loss prior to hospitalization (12 lb documented at MD visit in 02/2021).   Subjective: Pt alert. Perseverative verbal output, "oh." Pt's daughter feeding pt upon SLP entrance to room.   Assessment / Plan / Recommendation  Clinical Impressions 07/24/2021  Clinical Impression Patient presents with what appears to be moderate chronic oropharyngeal dysphagia with contributing factors of cognitive deficits, weakness, sensory deficits, and structural age-related changes. Oral phase is characterized by lingual weakness, disorganized lingual transport and lingual (and occasional lateral sulci) residue post-swallow. Pt requires 2-3 swallows to clear oral cavity. Swallow initiation is consistently delayed to the level of the pyriform sinuses with liquids. Pharyngeal phase is noted for mildly decreased base of tongue retraction, leading  to moderate residue collection in the valleculae, pyriform sinuses, CP segment and along the posterior pharyngeal wall. Contributing to decreased pharyngeal clearance is presence of cervical osteophytes (confirmed by radiologist), most significant ~C6, which appears to contribute to reduced amplitude of pharyngoesophageal segment opening. Closure occurs when 25-30% of the bolus remains in the pharynx.  Shallow laryngeal penetration occurs x2 with thin liquids (trace coating along the underside of the epiglottis); no penetration with any other consistency. Subsequent swallows were marginally effective in reducing pharyngeal residue, however when combined with chin tuck maneuver, pharyngeal clearance improved significantly, with minimal coating of residue remaining in the valleculae. Due to cognitive deficits, patient had difficulty implementing chin tuck with thin liquids, leading to discoordination and silent aspiration x1 due to impaired timing of the maneuver. When straw was used to facilitate chin tuck, patient maintained position and was able to utilize chin tuck to minimize residue without aspiration. Reviewed study findings with patient's daughter using images; daughter expressed desire for pt to eat and drink foods he likes (has had poor intake with puree and dislikes nectar-thick liquids). Education that strategies may reduce but cannot prevent aspiration risk, and that in order to perform compensatory maneuvers pt will need consistent supervision and cuing to implement effectively. Daugher verbalizes understanding of risks, strategies, and precautions. Recommend mechanical soft diet with chopped and well-moistened meats, thin liquids, meds crushed in puree. Pt will need assistance/cuing for chin tuck for each swallow. Clear oral cavity prior to taking another bite/sip. Will need 2-3 clearing swallows to reduce residue for each bite/sip. Upright posture during and after meals, with oral care prior to meals to reduce oral bacterial load.   SLP Visit Diagnosis Dysphagia, oropharyngeal phase (R13.12);Dysphagia, pharyngoesophageal phase (R13.14)  Impact on safety and function Mild aspiration risk;Moderate aspiration risk      Treatment Recommendations 07/24/2021  Treatment Recommendations Defer treatment plan to f/u with SLP     Prognosis 07/24/2021  Prognosis for Safe Diet Advancement Fair  Barriers to  Reach Goals Cognitive deficits;Time post onset  Barriers/Prognosis Comment --    Diet Recommendations 07/24/2021  SLP Diet Recommendations Dysphagia 3 (Mech soft) solids;Dysphagia 2 (Fine chop) solids;Thin liquid  Liquid Administration via Cup;Straw  Medication Administration Crushed with puree  Compensations Minimize environmental distractions;Slow rate;Small sips/bites;Multiple dry swallows after each bite/sip;Clear throat intermittently;Chin tuck;Use straw to facilitate chin tuck  Postural Changes Seated upright at 90 degrees;Remain semi-upright after after feeds/meals (Comment)      Other Recommendations 07/24/2021  Recommended Consults --  Oral Care Recommendations Oral care QID  Other Recommendations --  Follow Up Recommendations Skilled nursing-short term rehab (<3 hours/day)  Assistance recommended at discharge --  Functional Status Assessment --    Frequency and Duration  06/11/2021  Speech Therapy Frequency (ACUTE ONLY) min 2x/week  Treatment Duration 2 weeks      Oral Phase 07/24/2021  Oral Phase Impaired  Oral - Pudding Teaspoon --  Oral - Pudding Cup --  Oral - Honey Teaspoon --  Oral - Honey Cup --  Oral - Nectar Teaspoon Decreased bolus cohesion;Lingual/palatal residue  Oral - Nectar Cup Decreased bolus cohesion;Lingual/palatal residue  Oral - Nectar Straw --  Oral - Thin Teaspoon Decreased bolus cohesion;Lingual/palatal residue  Oral - Thin Cup Left pocketing in lateral sulci;Right pocketing in lateral sulci;Lingual/palatal residue;Decreased bolus cohesion  Oral - Thin Straw --  Oral - Puree Weak lingual manipulation;Reduced posterior propulsion;Lingual/palatal residue;Decreased bolus cohesion  Oral - Mech Soft Impaired mastication;Weak lingual manipulation;Reduced posterior propulsion;Lingual/palatal residue;Decreased bolus cohesion;Delayed oral  transit  Oral - Regular --  Oral - Multi-Consistency --  Oral - Pill --  Oral Phase - Comment --    Pharyngeal Phase  07/24/2021  Pharyngeal Phase Impaired  Pharyngeal- Pudding Teaspoon --  Pharyngeal --  Pharyngeal- Pudding Cup --  Pharyngeal --  Pharyngeal- Honey Teaspoon --  Pharyngeal --  Pharyngeal- Honey Cup --  Pharyngeal --  Pharyngeal- Nectar Teaspoon Delayed swallow initiation-vallecula;Pharyngeal residue - valleculae;Pharyngeal residue - pyriform;Pharyngeal residue - posterior pharnyx;Pharyngeal residue - cp segment  Pharyngeal Material does not enter airway  Pharyngeal- Nectar Cup Delayed swallow initiation-pyriform sinuses;Reduced tongue base retraction;Pharyngeal residue - valleculae;Pharyngeal residue - pyriform;Pharyngeal residue - posterior pharnyx;Pharyngeal residue - cp segment  Pharyngeal Material does not enter airway  Pharyngeal- Nectar Straw --  Pharyngeal --  Pharyngeal- Thin Teaspoon Delayed swallow initiation-vallecula;Reduced tongue base retraction;Pharyngeal residue - valleculae;Pharyngeal residue - pyriform;Pharyngeal residue - posterior pharnyx;Pharyngeal residue - cp segment  Pharyngeal Material does not enter airway  Pharyngeal- Thin Cup Delayed swallow initiation-pyriform sinuses;Penetration/Aspiration during swallow;Trace aspiration;Pharyngeal residue - valleculae;Pharyngeal residue - pyriform;Pharyngeal residue - posterior pharnyx;Pharyngeal residue - cp segment  Pharyngeal Material does not enter airway;Material enters airway, remains ABOVE vocal cords and not ejected out;Material enters airway, passes BELOW cords without attempt by patient to eject out (silent aspiration)  Pharyngeal- Thin Straw --  Pharyngeal --  Pharyngeal- Puree Delayed swallow initiation-vallecula;Reduced tongue base retraction;Pharyngeal residue - valleculae;Pharyngeal residue - pyriform;Pharyngeal residue - posterior pharnyx;Pharyngeal residue - cp segment  Pharyngeal Material does not enter airway  Pharyngeal- Mechanical Soft Delayed swallow initiation-vallecula;Reduced tongue base  retraction;Pharyngeal residue - valleculae;Pharyngeal residue - pyriform;Pharyngeal residue - cp segment  Pharyngeal Material does not enter airway  Pharyngeal- Regular --  Pharyngeal --  Pharyngeal- Multi-consistency --  Pharyngeal --  Pharyngeal- Pill --  Pharyngeal --  Pharyngeal Comment --     Cervical Esophageal Phase  07/24/2021  Cervical Esophageal Phase Impaired  Pudding Teaspoon --  Pudding Cup --  Honey Teaspoon --  Honey Cup --  Nectar Teaspoon --  Nectar Cup --  Nectar Straw --  Thin Teaspoon Prominent cricopharyngeal segment  Thin Cup --  Thin Straw --  Puree --  Mechanical Soft --  Regular --  Multi-consistency --  Pill --  Cervical Esophageal Comment bony protrusion ~c6 with reduced PE segment opening and premature closure with retention of ~25-30% of bolus in the pharynx     Aliene Altes 07/24/2021, 2:53 PM   Dysphagia, oropharyngeal phase  Dysphagia, unspecified type - Plan: DG SWALLOW FUNC OP MEDICARE SPEECH PATH, DG SWALLOW FUNC OP MEDICARE SPEECH PATH  Cough, unspecified type - Plan: DG SWALLOW FUNC OP MEDICARE SPEECH PATH, DG SWALLOW FUNC OP MEDICARE SPEECH PATH        Problem List Patient Active Problem List   Diagnosis Date Noted   Acute hypoxemic respiratory failure ([place]) 06/11/2021   Aspiration pneumonia of both lower lobes due to gastric secretions ([place]) 06/11/2021   Dementia without behavioral disturbance ([place]) 06/11/2021   Right femoral fracture ([place]) 06/06/2021   Leucocytosis 06/06/2021   Obsessive behavior 03/11/2021   History of CVA (cerebrovascular accident) 09/21/2020   Renal cyst 09/21/2020   Weakness 09/21/2020   Cerebral atrophy ([place]) 09/15/2020   Cerebrovascular accident (CVA) ([place])    Left facial numbness 09/12/2019   HTN (hypertension) 09/12/2019   GERD (gastroesophageal reflux disease) 09/12/2019   Arthritis 08/13/2018   Benign prostatic hyperplasia 08/13/2018   Diverticulosis 08/13/2018   History of anemia  08/13/2018   History of colonic  polyps 08/13/2018   History of nephrolithiasis 08/13/2018   History of pneumonia 08/13/2018   Weight loss 12/15/2016   Healthcare maintenance 12/14/2016   Skin lesion of chest wall 09/23/2016   Anemia 09/17/2016   Hypercholesterolemia 09/17/2016   Incomplete emptying of bladder 02/02/2016   TIA (transient ischemic attack) 01/08/2016   Macular degeneration 01/08/2016   History of prostate cancer 01/08/2016   PVD (peripheral vascular disease) ([place]) 10/12/2015   SCC (squamous cell carcinoma) 01/24/2015   Bladder outlet obstruction 04/15/2013   Acquired cyst of kidney 04/16/2012   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, [place], [place] 07/24/2021, 2:53 PM  [place], [place], [address] Phone: [phone]   Fax:     Name: Todd Trujillo. MRN: [id number] Date of Birth: 1929/11/24

## 2021-07-25 ENCOUNTER — Encounter: Payer: Self-pay | Admitting: Internal Medicine

## 2021-07-27 ENCOUNTER — Ambulatory Visit: Payer: Medicare Other | Admitting: Internal Medicine

## 2021-07-28 ENCOUNTER — Encounter: Payer: Self-pay | Admitting: Internal Medicine

## 2021-08-03 NOTE — Telephone Encounter (Signed)
Patient's daughter called and confirmed appointment for 08/18/2021.

## 2021-08-03 NOTE — Telephone Encounter (Signed)
Noted  

## 2021-08-04 ENCOUNTER — Telehealth: Payer: Self-pay | Admitting: Internal Medicine

## 2021-08-04 NOTE — Telephone Encounter (Signed)
Todd Trujillo from [facility] called in stating she would like to speak with you about Pt pressure ulcers. Jonelle Sidle is requesting a callback at [phone] option 2

## 2021-08-07 NOTE — Telephone Encounter (Signed)
Ok. Noted 

## 2021-08-07 NOTE — Telephone Encounter (Signed)
FYI-   Tiffany from [facility] called to report that patient has pressure ulcers- Stage 2 on the left heel and un-stagable on the right heel. Needing verbals to continue dressing right heel with calcium alginate and santil and left heel using skin prep and dry dressing. Verbals given per Dr Nicki Reaper. Has f/u on 2/3

## 2021-08-10 ENCOUNTER — Telehealth: Payer: Self-pay | Admitting: Student

## 2021-08-10 NOTE — Telephone Encounter (Signed)
Attempted to contact patient's daughter Amy to schedule Palliative COnsult, left voicemail with reason for call along with my name and call back number.

## 2021-08-18 ENCOUNTER — Ambulatory Visit (INDEPENDENT_AMBULATORY_CARE_PROVIDER_SITE_OTHER): Payer: Medicare Other | Admitting: Internal Medicine

## 2021-08-18 ENCOUNTER — Other Ambulatory Visit: Payer: Self-pay

## 2021-08-18 ENCOUNTER — Telehealth: Payer: Self-pay | Admitting: Internal Medicine

## 2021-08-18 ENCOUNTER — Encounter: Payer: Self-pay | Admitting: Internal Medicine

## 2021-08-18 VITALS — BP 110/54 | HR 77 | Temp 98.0°F | Resp 14 | Ht 70.0 in | Wt 134.5 lb

## 2021-08-18 DIAGNOSIS — N281 Cyst of kidney, acquired: Secondary | ICD-10-CM

## 2021-08-18 DIAGNOSIS — G319 Degenerative disease of nervous system, unspecified: Secondary | ICD-10-CM

## 2021-08-18 DIAGNOSIS — I10 Essential (primary) hypertension: Secondary | ICD-10-CM | POA: Diagnosis not present

## 2021-08-18 DIAGNOSIS — Z8701 Personal history of pneumonia (recurrent): Secondary | ICD-10-CM

## 2021-08-18 DIAGNOSIS — E7800 Pure hypercholesterolemia, unspecified: Secondary | ICD-10-CM | POA: Diagnosis not present

## 2021-08-18 DIAGNOSIS — D72829 Elevated white blood cell count, unspecified: Secondary | ICD-10-CM

## 2021-08-18 DIAGNOSIS — I739 Peripheral vascular disease, unspecified: Secondary | ICD-10-CM

## 2021-08-18 DIAGNOSIS — F0390 Unspecified dementia without behavioral disturbance: Secondary | ICD-10-CM

## 2021-08-18 DIAGNOSIS — R634 Abnormal weight loss: Secondary | ICD-10-CM

## 2021-08-18 DIAGNOSIS — Z8673 Personal history of transient ischemic attack (TIA), and cerebral infarction without residual deficits: Secondary | ICD-10-CM

## 2021-08-18 DIAGNOSIS — D509 Iron deficiency anemia, unspecified: Secondary | ICD-10-CM | POA: Diagnosis not present

## 2021-08-18 DIAGNOSIS — Z8679 Personal history of other diseases of the circulatory system: Secondary | ICD-10-CM

## 2021-08-18 LAB — CBC WITH DIFFERENTIAL/PLATELET
Basophils Absolute: 0.1 10*3/uL (ref 0.0–0.1)
Basophils Relative: 0.8 % (ref 0.0–3.0)
Eosinophils Absolute: 0.4 10*3/uL (ref 0.0–0.7)
Eosinophils Relative: 3.4 % (ref 0.0–5.0)
HCT: 31.9 % — ABNORMAL LOW (ref 39.0–52.0)
Hemoglobin: 10.1 g/dL — ABNORMAL LOW (ref 13.0–17.0)
Lymphocytes Relative: 25.5 % (ref 12.0–46.0)
Lymphs Abs: 2.8 10*3/uL (ref 0.7–4.0)
MCHC: 31.5 g/dL (ref 30.0–36.0)
MCV: 84.4 fl (ref 78.0–100.0)
Monocytes Absolute: 0.8 10*3/uL (ref 0.1–1.0)
Monocytes Relative: 7.6 % (ref 3.0–12.0)
Neutro Abs: 6.9 10*3/uL (ref 1.4–7.7)
Neutrophils Relative %: 62.7 % (ref 43.0–77.0)
Platelets: 355 10*3/uL (ref 150.0–400.0)
RBC: 3.78 Mil/uL — ABNORMAL LOW (ref 4.22–5.81)
RDW: 17.5 % — ABNORMAL HIGH (ref 11.5–15.5)
WBC: 11.1 10*3/uL — ABNORMAL HIGH (ref 4.0–10.5)

## 2021-08-18 LAB — LIPID PANEL
Cholesterol: 183 mg/dL (ref 0–200)
HDL: 40.2 mg/dL (ref >39.00)
LDL Cholesterol: 116 mg/dL — ABNORMAL HIGH (ref 0–99)
NonHDL: 142.89
Total CHOL/HDL Ratio: 5
Triglycerides: 134 mg/dL (ref 0.0–149.0)
VLDL: 26.8 mg/dL (ref 0.0–40.0)

## 2021-08-18 LAB — BASIC METABOLIC PANEL
BUN: 21 mg/dL (ref 6–23)
CO2: 30 mEq/L (ref 19–32)
Calcium: 9.6 mg/dL (ref 8.4–10.5)
Chloride: 103 mEq/L (ref 96–112)
Creatinine, Ser: 0.83 mg/dL (ref 0.40–1.50)
GFR: 77.63 mL/min (ref >60.00)
Glucose, Bld: 104 mg/dL — ABNORMAL HIGH (ref 70–99)
Potassium: 4.4 mEq/L (ref 3.5–5.1)
Sodium: 141 mEq/L (ref 135–145)

## 2021-08-18 LAB — HEPATIC FUNCTION PANEL
ALT: 5 U/L (ref 0–53)
AST: 11 U/L (ref 0–37)
Albumin: 3.4 g/dL — ABNORMAL LOW (ref 3.5–5.2)
Alkaline Phosphatase: 119 U/L — ABNORMAL HIGH (ref 39–117)
Bilirubin, Direct: 0.1 mg/dL (ref 0.0–0.3)
Total Bilirubin: 0.3 mg/dL (ref 0.2–1.2)
Total Protein: 6.2 g/dL (ref 6.0–8.3)

## 2021-08-18 LAB — IBC + FERRITIN
Ferritin: 68.5 ng/mL (ref 22.0–322.0)
Iron: <10 ug/dL — ABNORMAL LOW (ref 42–165)
Saturation Ratios: 4.2 % — ABNORMAL LOW (ref 20.0–50.0)
TIBC: 238 ug/dL — ABNORMAL LOW (ref 250.0–450.0)
Transferrin: 170 mg/dL — ABNORMAL LOW (ref 212.0–360.0)

## 2021-08-18 LAB — VITAMIN B12: Vitamin B-12: 576 pg/mL (ref 211–911)

## 2021-08-18 LAB — TSH: TSH: 2.03 u[IU]/mL (ref 0.35–5.50)

## 2021-08-18 MED ORDER — LEVOFLOXACIN 0.5 % OP SOLN: 1.0000 [drp] | Freq: Four times a day (QID) | OPHTHALMIC | 0 refills | Status: DC

## 2021-08-18 NOTE — Telephone Encounter (Signed)
Verbal given for ciprofloxacin

## 2021-08-18 NOTE — Telephone Encounter (Signed)
Arbie Cookey from [facility] called in regards to pts prescription for  levofloxacin Theodoro Clock) 0.5 % ophthalmic solution  Arbie Cookey states they do not have any of this product in stock but they do have Ciprofloxacin and Moxifloxacin.   [phone]

## 2021-08-18 NOTE — Telephone Encounter (Signed)
ok 

## 2021-08-18 NOTE — Telephone Encounter (Signed)
Ok to switch to one of these?

## 2021-08-18 NOTE — Progress Notes (Signed)
Patient ID: Todd Trujillo., male   DOB: 12-09-1929, 88 y.o.   MRN: [id number]   Subjective:    Patient ID: Todd Trujillo., male    DOB: [birth year], 88 y.o.   MRN: [id number]  This visit occurred during the SARS-CoV-2 public health emergency.  Safety protocols were in place, including screening questions prior to the visit, additional usage of staff PPE, and extensive cleaning of exam room while observing appropriate contact time as indicated for disinfecting solutions.   Patient here for a scheduled follow up.   Chief Complaint  Patient presents with   Hospitalization Follow-up    Right femoral fracture   .   HPI He is accompanied by his daughter,  History obtained from both of them.  Admitted 06/06/21 - 06/15/21 - right femoral fracture - s/p ORIF.  Hospital course complicated by presumed aspiration pneumonia, worsening dysphagia and weakness.  Also, hypoxic respiratory failure and subacute subdural hematomas.  While in hospital, completed 4 days IV unasyn and discharged on augmentin.  Was discharged to rehab for continued therapy/care.  Home now.  Home health - in place.  Pressure ulcers:  left heel and right heel.  Home health nurse - following/dressing.  Working with therapy - was able to stand without assistance.  Needs assistance to ambulate and ADLs, etc.  No chest pain.  Breathing stable.  Reports no increased cough or congestion.  Eating some better.  Weight is down.  No abdominal pain.  Bowels moving.  Dark - tongue.  Previous issues with thrush.  Not able to swallow iron.  Wife - crushing.     Past Medical History:  Diagnosis Date   Arthritis    Cancer [facility])    prostate   Hyperlipidemia    Hypertension    Left wrist fracture    Macular degeneration of right eye    Nephrolithiasis    Prostate CA ([place]) 2003   Prostate troubles    Patient was unsure of term   Squamous cell carcinoma of skin 01/23/2017   R distal lat bicep near elbow   Past Surgical  History:  Procedure Laterality Date   INTRAMEDULLARY (IM) NAIL INTERTROCHANTERIC Right 06/07/2021   Procedure: INTRAMEDULLARY (IM) NAIL INTERTROCHANTRIC;  Surgeon: Thornton Park, MD;  Location: [facility] ORS;  Service: Orthopedics;  Laterality: Right;   TOTAL KNEE ARTHROPLASTY     Family History  Problem Relation Age of Onset   Colon cancer Mother    Social History   Socioeconomic History   Marital status: Married    Spouse name: Not on file   Number of children: Not on file   Years of education: Not on file   Highest education level: Not on file  Occupational History   Not on file  Tobacco Use   Smoking status: Never   Smokeless tobacco: Never  Substance and Sexual Activity   Alcohol use: No   Drug use: Not Currently   Sexual activity: Not on file  Other Topics Concern   Not on file  Social History Narrative   Not on file   Social Determinants of Health   Financial Resource Strain: Not on file  Food Insecurity: Not on file  Transportation Needs: Not on file  Physical Activity: Not on file  Stress: Not on file  Social Connections: Not on file     Review of Systems  Constitutional:  Negative for appetite change and unexpected weight change.  HENT:  Negative for congestion and sinus pressure.  Respiratory:  Negative for cough and chest tightness.        No increased sob reported.   Cardiovascular:  Negative for chest pain and palpitations.  Gastrointestinal:  Negative for abdominal pain, diarrhea, nausea and vomiting.  Genitourinary:  Negative for difficulty urinating and dysuria.  Musculoskeletal:  Negative for joint swelling and myalgias.  Skin:  Negative for color change and rash.  Neurological:  Negative for dizziness and headaches.  Psychiatric/Behavioral:  Negative for agitation and dysphoric mood.       Objective:     BP (!) 110/54    Pulse 77    Temp 98 F (36.7 C) (Oral)    Resp 14    Ht 5\' 10"  (1.778 m)    Wt 134 lb 8 oz (61 kg)    SpO2 99%    BMI  19.30 kg/m  Wt Readings from Last 3 Encounters:  08/18/21 134 lb 8 oz (61 kg)  06/07/21 153 lb 9.6 oz (69.7 kg)  04/18/21 143 lb 11.2 oz (65.2 kg)    Physical Exam Constitutional:      General: He is not in acute distress.    Appearance: Normal appearance. He is well-developed.  HENT:     Head: Normocephalic and atraumatic.     Right Ear: External ear normal.     Left Ear: External ear normal.  Eyes:     General: No scleral icterus.       Right eye: No discharge.        Left eye: No discharge.  Cardiovascular:     Rate and Rhythm: Normal rate and regular rhythm.  Pulmonary:     Effort: Pulmonary effort is normal. No respiratory distress.     Breath sounds: Normal breath sounds.  Abdominal:     General: Bowel sounds are normal.     Palpations: Abdomen is soft.     Tenderness: There is no abdominal tenderness.  Musculoskeletal:        General: No swelling or tenderness.     Cervical back: Neck supple. No tenderness.  Lymphadenopathy:     Cervical: No cervical adenopathy.  Skin:    Findings: No erythema or rash.     Comments: Pressure ulcer - bilateral heels.    Neurological:     Mental Status: He is alert.  Psychiatric:        Mood and Affect: Mood normal.        Behavior: Behavior normal.     Outpatient Encounter Medications as of 08/18/2021  Medication Sig   acetaminophen (TYLENOL) 500 MG tablet Take 500 mg by mouth every 6 (six) hours as needed.   cholecalciferol (VITAMIN D3) 25 MCG (1000 UNIT) tablet Take 1,000 Units by mouth daily.   ferrous sulfate 325 (65 FE) MG tablet Take 325 mg by mouth daily with breakfast.   levofloxacin (QUIXIN) 0.5 % ophthalmic solution Place 1 drop into both eyes every 6 (six) hours.   Nutritional Supplements (FEEDING SUPPLEMENT, NEPRO CARB STEADY,) LIQD Take 237 mLs by mouth 2 (two) times daily between meals.   omeprazole (PRILOSEC) 40 MG capsule Take 40 mg by mouth daily.   QUEtiapine (SEROQUEL) 50 MG tablet Take 1 tablet (50 mg  total) by mouth at bedtime.   SANTYL ointment Apply topically.   sertraline (ZOLOFT) 25 MG tablet TAKE 1 TABLET BY MOUTH DAILY.   aspirin EC 81 MG tablet Take 1 tablet (81 mg total) by mouth daily. Swallow whole.   [DISCONTINUED] aspirin EC 81 MG tablet  Take 1 tablet (81 mg total) by mouth daily. Swallow whole. (Patient not taking: Reported on 08/18/2021)   [DISCONTINUED] traMADol (ULTRAM) 50 MG tablet Take 0.5 tablets (25 mg total) by mouth every 6 (six) hours. (Patient not taking: Reported on 08/18/2021)   No facility-administered encounter medications on file as of 08/18/2021.     Lab Results  Component Value Date   WBC 11.1 (H) 08/18/2021   HGB 10.1 (L) 08/18/2021   HCT 31.9 (L) 08/18/2021   PLT 355.0 08/18/2021   GLUCOSE 104 (H) 08/18/2021   CHOL 183 08/18/2021   TRIG 134.0 08/18/2021   HDL 40.20 08/18/2021   LDLCALC 116 (H) 08/18/2021   ALT 5 08/18/2021   AST 11 08/18/2021   NA 141 08/18/2021   K 4.4 08/18/2021   CL 103 08/18/2021   CREATININE 0.83 08/18/2021   BUN 21 08/18/2021   CO2 30 08/18/2021   TSH 2.03 08/18/2021   INR 1.5 (H) 06/06/2021   HGBA1C 5.5 09/13/2019    DG SWALLOW FUNC OP MEDICARE SPEECH PATH  Result Date: 07/24/2021 Table formatting from the original result was not included. CLINICAL DATA:  88 year old male with a history of dysphagia EXAM: MODIFIED BARIUM SWALLOW TECHNIQUE: Different consistencies of barium were administered orally to the patient by the Speech Pathologist. Imaging of the pharynx was performed in the lateral projection. APP was present in the fluoroscopy room during this study, which was supervised and interpreted by Rad name. FLUOROSCOPY TIME:  Fluoroscopy Time:  2 minutes 54 seconds COMPARISON:  None FINDINGS: Thin barium via teaspoon, thin barium sitting from cup, nectar via teaspoon, nectar from cup, barium pudding via teaspoon, cookie with pudding, and finally thin barium from a cup. Vestibular Penetration: Penetration was observed sparingly  with the thin barium. Aspiration: Aspiration was observed on the final thin barium from cup maneuver. Other: Early spillage of the pharyngeal bolus was observed nearly throughout the exam into both vallecular and piriform sinuses. Residual within the piriform sinuses greater than vallecular. No significant narrowing. Anterior osteophytes most pronounced at C5-C6. IMPRESSION: Penetration and aspiration observed sparingly with thin barium preparation. Early oro-pharyngeal spillage observed throughout, with stasis in the vallecula and piriform sinuses, partially cleared with chin tuck maneuver. Electronically Signed   By: Corrie Mckusick D.O.   On: 07/24/2021 13:22 SLP Summary Report below; please see Notes for full report. HPI: Patient is an 88 y.o. male referred from his SNF for MBS for possible aspiration. Patient was recently hospitalized at [facility] for right femoral fracture 06/06/21-06/15/21 s/p fall at home, with hospital course complicated by subacute subdural hematomas and aspiration event (vomited), aspiration PNA. He was evaluated by ST during admission with recommendation for dysphagia 1 (puree) diet with nectar thick liquids with subsequent d/c to SNF. Past medical history also includes: GERD, dementia, CVA, HTN, HLD, prostate cancer. Daughter reports FEES attempted at facility however assessment was limited because pt pulled tube. Pt has been consuming thin liquids and puree at facility per daughter, with family acknowledging aspiration risk. Daughter also reports pt weight loss prior to hospitalization (12 lb documented at MD visit in 02/2021).  Subjective: Pt alert. Perseverative verbal output, "oh." Pt's daughter feeding pt upon SLP entrance to room. Assessment / Plan / Recommendation Clinical Impressions 07/24/2021 Clinical Impression Patient presents with what appears to be moderate chronic oropharyngeal dysphagia with contributing factors of cognitive deficits, weakness, sensory deficits, and structural  age-related changes. Oral phase is characterized by lingual weakness, disorganized lingual transport and lingual (and occasional lateral  sulci) residue post-swallow. Pt requires 2-3 swallows to clear oral cavity. Swallow initiation is consistently delayed to the level of the pyriform sinuses with liquids. Pharyngeal phase is noted for mildly decreased base of tongue retraction, leading to moderate residue collection in the valleculae, pyriform sinuses, CP segment and along the posterior pharyngeal wall. Contributing to decreased pharyngeal clearance is presence of cervical osteophytes (confirmed by radiologist), most significant ~C6, which appears to contribute to reduced amplitude of pharyngoesophageal segment opening. Closure occurs when 25-30% of the bolus remains in the pharynx. Shallow laryngeal penetration occurs x2 with thin liquids (trace coating along the underside of the epiglottis); no penetration with any other consistency. Subsequent swallows were marginally effective in reducing pharyngeal residue, however when combined with chin tuck maneuver, pharyngeal clearance improved significantly, with minimal coating of residue remaining in the valleculae. Due to cognitive deficits, patient had difficulty implementing chin tuck with thin liquids, leading to discoordination and silent aspiration x1 due to impaired timing of the maneuver. When straw was used to facilitate chin tuck, patient maintained position and was able to utilize chin tuck to minimize residue without aspiration. Reviewed study findings with patient's daughter using images; daughter expressed desire for pt to eat and drink foods he likes (has had poor intake with puree and dislikes nectar-thick liquids). Education that strategies may reduce but cannot prevent aspiration risk, and that in order to perform compensatory maneuvers pt will need consistent supervision and cuing to implement effectively. Daugher verbalizes understanding of risks,  strategies, and precautions. Recommend mechanical soft diet with chopped and well-moistened meats, thin liquids, meds crushed in puree. Pt will need assistance/cuing for chin tuck for each swallow. Clear oral cavity prior to taking another bite/sip. Will need 2-3 clearing swallows to reduce residue for each bite/sip. Upright posture during and after meals, with oral care prior to meals to reduce oral bacterial load.  SLP Visit Diagnosis Dysphagia, oropharyngeal phase (R13.12);Dysphagia, pharyngoesophageal phase (R13.14) Impact on safety and function Mild aspiration risk;Moderate aspiration risk   Deneise Lever, MS, CCC-SLP Speech-Language Pathologist      Assessment & Plan:   Problem List Items Addressed This Visit     Anemia - Primary    Trouble taking iron.  hgb low.  Recheck cbc and iron studies today.  May need IV iron infusion.        Relevant Orders   CBC with Differential/Platelet (Completed)   Vitamin B12 (Completed)   IBC + Ferritin (Completed)   Cerebral atrophy ([place])    Noted on recent scan.  Saw neurology.  On seroquel.  Follow         Dementia without behavioral disturbance [facility])    Seeing neurology.       History of CVA (cerebrovascular accident)    History of right thalamic infarct.  Continue aspirin.  Completed course of plavix.  Continue risk factor modification. Will continue aspirin, given history.        History of pneumonia    Presumed aspiration pneumonia in hospital.  Off oxygen.  Breathing better.  Denies sob.       History of subdural hematoma    Noted on MRI during recent admission.  Was discharged on aspirin given history of infarcts.  Will continue.  Follow.        HTN (hypertension)    Continue lisinopril.  Follow pressures.  Follow metabolic panel.  Blood pressure ok.       Relevant Medications   aspirin EC 81 MG tablet   Hypercholesterolemia  Intolerance documented to statins.  Follow.       Relevant Medications   aspirin EC 81 MG  tablet   Other Relevant Orders   Hepatic function panel (Completed)   Basic metabolic panel (Completed)   TSH (Completed)   Lipid panel (Completed)   Leucocytosis    Recheck cbc.       PVD (peripheral vascular disease) ([place])    Carotid ultrasound 2021 - no hemodynamically significant stenosis - moderate heterogenous and calcified plaque. Continue risk factor modification.       Relevant Medications   aspirin EC 81 MG tablet   Renal cyst    Has seen urology.  Likely RCC.       Weight loss    Continued weight loss.  Appears to be eating some better.  Discussed swallowing.  Slow bites.  Chew food well.  Eat slowly.  Follow.        I spent over 45 minutes with the patient and more than 50% of the time was spent in consultation regarding the above.  Time spent discussing current symptoms and recent hospitalization.  Time also spent discussing further treatment and evaluation.    Einar Pheasant, MD

## 2021-08-19 ENCOUNTER — Encounter: Payer: Self-pay | Admitting: Internal Medicine

## 2021-08-19 DIAGNOSIS — Z8679 Personal history of other diseases of the circulatory system: Secondary | ICD-10-CM | POA: Insufficient documentation

## 2021-08-19 MED ORDER — ASPIRIN EC 81 MG PO TBEC: 81.0000 mg | DELAYED_RELEASE_TABLET | Freq: Every day | ORAL | 0 refills | Status: DC

## 2021-08-19 NOTE — Assessment & Plan Note (Signed)
Continue lisinopril.  Follow pressures.  Follow metabolic panel.  Blood pressure ok.  

## 2021-08-19 NOTE — Assessment & Plan Note (Signed)
Trouble taking iron.  hgb low.  Recheck cbc and iron studies today.  May need IV iron infusion.

## 2021-08-19 NOTE — Assessment & Plan Note (Signed)
Presumed aspiration pneumonia in hospital.  Off oxygen.  Breathing better.  Denies sob.

## 2021-08-19 NOTE — Assessment & Plan Note (Signed)
Has seen urology.  Likely RCC.  

## 2021-08-19 NOTE — Assessment & Plan Note (Signed)
Intolerance documented to statins.  Follow.  

## 2021-08-19 NOTE — Assessment & Plan Note (Signed)
Noted on recent scan.  Saw neurology.  On seroquel.  Follow    

## 2021-08-19 NOTE — Assessment & Plan Note (Signed)
Recheck cbc.  

## 2021-08-19 NOTE — Assessment & Plan Note (Signed)
Noted on MRI during recent admission.  Was discharged on aspirin given history of infarcts.  Will continue.  Follow.

## 2021-08-19 NOTE — Assessment & Plan Note (Signed)
Seeing neurology.   

## 2021-08-19 NOTE — Assessment & Plan Note (Signed)
Continued weight loss.  Appears to be eating some better.  Discussed swallowing.  Slow bites.  Chew food well.  Eat slowly.  Follow.

## 2021-08-19 NOTE — Assessment & Plan Note (Signed)
Carotid ultrasound 2021 - no hemodynamically significant stenosis - moderate heterogenous and calcified plaque. Continue risk factor modification.  

## 2021-08-19 NOTE — Assessment & Plan Note (Signed)
History of right thalamic infarct.  Continue aspirin.  Completed course of plavix.  Continue risk factor modification. Will continue aspirin, given history.   

## 2021-08-21 ENCOUNTER — Telehealth: Payer: Self-pay | Admitting: Internal Medicine

## 2021-08-21 DIAGNOSIS — S065XAA Traumatic subdural hemorrhage with loss of consciousness status unknown, initial encounter: Secondary | ICD-10-CM

## 2021-08-21 NOTE — Telephone Encounter (Signed)
Called and spoke to Todd Trujillo.  Discussed aspirin.  Discussed with neurology.  Continue ECASA 81mg  q day.  Also, discussed need f/u CT head without contrast - f/u subdural hematomas.  Agreeable.  CT ordered.

## 2021-08-30 ENCOUNTER — Other Ambulatory Visit: Payer: Self-pay | Admitting: Internal Medicine

## 2021-09-01 DIAGNOSIS — I10 Essential (primary) hypertension: Secondary | ICD-10-CM

## 2021-09-01 DIAGNOSIS — W19XXXD Unspecified fall, subsequent encounter: Secondary | ICD-10-CM | POA: Diagnosis not present

## 2021-09-01 DIAGNOSIS — L89622 Pressure ulcer of left heel, stage 2: Secondary | ICD-10-CM

## 2021-09-01 DIAGNOSIS — S065X0D Traumatic subdural hemorrhage without loss of consciousness, subsequent encounter: Secondary | ICD-10-CM | POA: Diagnosis not present

## 2021-09-01 DIAGNOSIS — R4681 Obsessive-compulsive behavior: Secondary | ICD-10-CM

## 2021-09-01 DIAGNOSIS — S7291XD Unspecified fracture of right femur, subsequent encounter for closed fracture with routine healing: Secondary | ICD-10-CM

## 2021-09-01 DIAGNOSIS — Z79891 Long term (current) use of opiate analgesic: Secondary | ICD-10-CM

## 2021-09-01 DIAGNOSIS — M1990 Unspecified osteoarthritis, unspecified site: Secondary | ICD-10-CM

## 2021-09-01 DIAGNOSIS — R1310 Dysphagia, unspecified: Secondary | ICD-10-CM

## 2021-09-01 DIAGNOSIS — F03A3 Unspecified dementia, mild, with mood disturbance: Secondary | ICD-10-CM

## 2021-09-01 DIAGNOSIS — K219 Gastro-esophageal reflux disease without esophagitis: Secondary | ICD-10-CM

## 2021-09-01 DIAGNOSIS — S72141D Displaced intertrochanteric fracture of right femur, subsequent encounter for closed fracture with routine healing: Secondary | ICD-10-CM | POA: Diagnosis not present

## 2021-09-01 DIAGNOSIS — F324 Major depressive disorder, single episode, in partial remission: Secondary | ICD-10-CM

## 2021-09-01 DIAGNOSIS — Z9181 History of falling: Secondary | ICD-10-CM

## 2021-09-01 DIAGNOSIS — H3530 Unspecified macular degeneration: Secondary | ICD-10-CM

## 2021-09-01 DIAGNOSIS — Z4800 Encounter for change or removal of nonsurgical wound dressing: Secondary | ICD-10-CM

## 2021-09-01 DIAGNOSIS — Z8546 Personal history of malignant neoplasm of prostate: Secondary | ICD-10-CM

## 2021-09-01 DIAGNOSIS — E559 Vitamin D deficiency, unspecified: Secondary | ICD-10-CM

## 2021-09-01 DIAGNOSIS — N400 Enlarged prostate without lower urinary tract symptoms: Secondary | ICD-10-CM

## 2021-09-01 DIAGNOSIS — L89613 Pressure ulcer of right heel, stage 3: Secondary | ICD-10-CM | POA: Diagnosis not present

## 2021-09-01 DIAGNOSIS — Z8673 Personal history of transient ischemic attack (TIA), and cerebral infarction without residual deficits: Secondary | ICD-10-CM

## 2021-09-01 DIAGNOSIS — H9190 Unspecified hearing loss, unspecified ear: Secondary | ICD-10-CM

## 2021-09-01 DIAGNOSIS — E785 Hyperlipidemia, unspecified: Secondary | ICD-10-CM

## 2021-09-01 DIAGNOSIS — E611 Iron deficiency: Secondary | ICD-10-CM

## 2021-09-04 ENCOUNTER — Ambulatory Visit
Admission: RE | Admit: 2021-09-04 | Discharge: 2021-09-04 | Disposition: A | Discharge status: Home / Self Care | Payer: Medicare Other | Source: Ambulatory Visit | Attending: Internal Medicine | Admitting: Internal Medicine

## 2021-09-04 ENCOUNTER — Other Ambulatory Visit: Payer: Self-pay

## 2021-09-04 DIAGNOSIS — S065XAA Traumatic subdural hemorrhage with loss of consciousness status unknown, initial encounter: Secondary | ICD-10-CM | POA: Diagnosis not present

## 2021-09-05 ENCOUNTER — Encounter: Payer: Self-pay | Admitting: Internal Medicine

## 2021-09-06 NOTE — Telephone Encounter (Signed)
Melissa from [facility] called back. She will call patient's daughter, Serena Colonel to set up an appointment with Dr B.

## 2021-09-07 NOTE — Telephone Encounter (Signed)
Per chart patient scheduled with Dr B 3/3

## 2021-09-08 ENCOUNTER — Other Ambulatory Visit: Payer: Self-pay | Admitting: Internal Medicine

## 2021-09-12 ENCOUNTER — Telehealth: Payer: 59 | Admitting: Student

## 2021-09-12 ENCOUNTER — Other Ambulatory Visit: Payer: 59 | Admitting: Student

## 2021-09-12 ENCOUNTER — Other Ambulatory Visit: Payer: Self-pay

## 2021-09-12 DIAGNOSIS — R531 Weakness: Secondary | ICD-10-CM

## 2021-09-12 DIAGNOSIS — L89619 Pressure ulcer of right heel, unspecified stage: Secondary | ICD-10-CM

## 2021-09-12 DIAGNOSIS — Z515 Encounter for palliative care: Secondary | ICD-10-CM

## 2021-09-12 DIAGNOSIS — F0390 Unspecified dementia without behavioral disturbance: Secondary | ICD-10-CM

## 2021-09-12 DIAGNOSIS — R630 Anorexia: Secondary | ICD-10-CM

## 2021-09-12 NOTE — Progress Notes (Deleted)
[facility] Consult Note Telephone: [phone]  Fax: [phone]   Date of encounter: 09/12/21 2:57 PM PATIENT NAME: Todd Trujillo [address] [address]   [phone] (home)  DOB: 1930-02-08 MRN: [id number] PRIMARY CARE PROVIDER:    Einar Pheasant, MD,  [address] [place] [address] [phone]  REFERRING PROVIDER:   Einar Pheasant, [address] [place],   [address] [phone]  RESPONSIBLE PARTY:    Contact Information     Name Relation Home Work [place] Daughter [phone]     Janann August Daughter [phone]     [place], [place] [phone]     Etta Grandchild Daughter [phone]  [phone]       Due to the COVID-19 crisis, this visit was done via telemedicine from my office and it was initiated and consent by this patient and or family.  I connected with  [name]. OR PROXY on 09/12/21 by a video enabled telemedicine application and verified that I am speaking with the correct person using two identifiers.   I discussed the limitations of evaluation and management by telemedicine. The patient expressed understanding and agreed to proceed.                                     ASSESSMENT AND PLAN / RECOMMENDATIONS:   Advance Care Planning/Goals of Care: Goals include to maximize quality of life and symptom management. Patient/health care surrogate gave his/her permission to discuss.Our advance care planning conversation included a discussion about:    The value and importance of advance care planning  Experiences with loved ones who have been seriously ill or have died  Exploration of personal, cultural or spiritual beliefs that might influence medical decisions  Exploration of goals of care in the event of a sudden injury or illness  Code status will be an ongoing  discussion.  CODE STATUS: Full Code  Family would like for patient to remain in the home with support. We started conversation of code status, patient's wishes and quality of life. Family would like to discuss code status further. Education provided on palliative medicine. Palliative medicine will continue to provide ongoing support.    I spent 17 minutes providing this consultation. More than 50% of the time in this consultation was spent in counseling and care coordination.  ----------------------------------------------------------------------------------------------------------- Symptom Management/Plan:  Dementia without behaviors- continue to reorient and redirect as needed. Monitor for falls/safety. Encourage uses of walker with ambulation.   Generalized weakness-continue HH PT and OT as directed; encourage use of walker for ambulation.   Would to right heel-wound is healing per family. Continue dressing changes as directed. HH SN services   Appetite- patient's appetite is fair overall. Encourage foods patient enjoys. Monitor for swallowing difficulty. Monitor for weight loss.   Follow up Palliative Care Visit: Palliative care will continue to follow for complex medical decision making, advance care planning, and clarification of goals. Return in 4-6 weeks or prn.   This visit was coded based on medical decision making (MDM).  PPS: 40%  HOSPICE ELIGIBILITY/DIAGNOSIS: TBD  Chief Complaint: Palliative Medicine initial consult.   HISTORY OF PRESENT ILLNESS:  Todd Blaustein. is a 88 y.o. year old male  with dementia without behavioral disturbance, hypertension, hyperlipidemia, cerebral atrophy, IDA, PVD, renal cyst, weight loss, hx of cerebral infarction, hx of CVA,  hx of subdural hematoma. Patient hospitalized 11/22-12/1/22; discharged to SNF, then home since 08/01/21  Patient resides at home with wife, family support. He is currently receiving HH PT, OT and SN services.  He does have a pressure ulcer to right heel; wound is healing per family. He is now ambulating again per family; using walker. Denies pain, shortness of breath, nausea, constipation. His appetite is improving some; fair overall. He is to have an iron infusion this Friday.   History obtained from review of EMR, discussion with primary team, and interview with family, facility staff/caregiver and/or Todd Trujillo.  I reviewed available labs, medications, imaging, studies and related documents from the EMR.  Records reviewed and summarized above.   Physical Exam: Constitutional: NAD General: frail appearing, thin EYES: anicteric sclera, lids intact, no discharge  ENMT: hard of hearing Pulmonary: no increased work of breathing, no cough, room air GU: deferred MSK: sarcopenia, moves all extremities, ambulatory Skin: warm and dry, no rashes or wounds on visible skin; right heel not visualized.  Neuro: generalized weakness Psych: non-anxious affect, A and O x 2 Hem/lymph/immuno: no widespread bruising CURRENT PROBLEM LIST:  Patient Active Problem List   Diagnosis Date Noted   History of subdural hematoma 08/19/2021   Aspiration pneumonia of both lower lobes due to gastric secretions ([facility]) 06/11/2021   Dementia without behavioral disturbance ([facility]) 06/11/2021   Right femoral fracture ([place]) 06/06/2021   Leucocytosis 06/06/2021   Obsessive behavior 03/11/2021   History of CVA (cerebrovascular accident) 09/21/2020   Renal cyst 09/21/2020   Weakness 09/21/2020   Cerebral atrophy ([facility]) 09/15/2020   Cerebrovascular accident (CVA) ([place])    Left facial numbness 09/12/2019   HTN (hypertension) 09/12/2019   GERD (gastroesophageal reflux disease) 09/12/2019   Arthritis 08/13/2018   Benign prostatic hyperplasia 08/13/2018   Diverticulosis 08/13/2018   History of anemia 08/13/2018   History of colonic polyps 08/13/2018   History of nephrolithiasis 08/13/2018   History of pneumonia 08/13/2018    Weight loss 12/15/2016   Healthcare maintenance 12/14/2016   Skin lesion of chest wall 09/23/2016   Anemia 09/17/2016   Hypercholesterolemia 09/17/2016   Incomplete emptying of bladder 02/02/2016   TIA (transient ischemic attack) 01/08/2016   Macular degeneration 01/08/2016   History of prostate cancer 01/08/2016   PVD (peripheral vascular disease) ([place]) 10/12/2015   SCC (squamous cell carcinoma) 01/24/2015   Bladder outlet obstruction 04/15/2013   Acquired cyst of kidney 04/16/2012   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/2012   PAST MEDICAL HISTORY:  Active Ambulatory Problems    Diagnosis Date Noted   TIA (transient ischemic attack) 01/08/2016   Macular degeneration 01/08/2016   History of prostate cancer 01/08/2016   Anemia 09/17/2016   Hypercholesterolemia 09/17/2016   Skin lesion of chest wall 09/23/2016   Healthcare maintenance 12/14/2016   Weight loss 12/15/2016   Acquired cyst of kidney 04/16/2012   Arthritis 08/13/2018   Benign prostatic hyperplasia 08/13/2018   Bladder outlet obstruction 04/15/2013   Diverticulosis 08/13/2018   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/2012   History of anemia 08/13/2018   History of colonic polyps 08/13/2018   History of nephrolithiasis 08/13/2018   History of pneumonia 08/13/2018   Incomplete emptying of bladder 02/02/2016   PVD (peripheral vascular disease) ([place]) 10/12/2015   SCC (squamous cell carcinoma) 01/24/2015   Left facial numbness 09/12/2019   HTN (hypertension) 09/12/2019   GERD (gastroesophageal reflux disease) 09/12/2019   Cerebrovascular accident (CVA) ([facility])    Cerebral atrophy ([place] [facility])  09/15/2020   History of CVA (cerebrovascular accident) 09/21/2020   Renal cyst 09/21/2020   Weakness 09/21/2020   Obsessive behavior 03/11/2021   Right femoral fracture ([facility]) 06/06/2021   Leucocytosis 06/06/2021   Aspiration pneumonia of both lower lobes due to gastric secretions ([place]) 06/11/2021   Dementia  without behavioral disturbance ([place]) 06/11/2021   History of subdural hematoma 08/19/2021   Resolved Ambulatory Problems    Diagnosis Date Noted   Malignant neoplasm of prostate ([facility]) 04/16/2012   Acute hypoxemic respiratory failure ([place]) 06/11/2021   Past Medical History:  Diagnosis Date   Cancer ([facility])    Hyperlipidemia    Hypertension    Left wrist fracture    Macular degeneration of right eye    Nephrolithiasis    Prostate CA ([facility]) 2003   Prostate troubles    Squamous cell carcinoma of skin 01/23/2017   SOCIAL HX:  Social History   Tobacco Use   Smoking status: Never   Smokeless tobacco: Never  Substance Use Topics   Alcohol use: No   FAMILY HX:  Family History  Problem Relation Age of Onset   Colon cancer Mother       ALLERGIES:  Allergies  Allergen Reactions   Codeine Nausea And Vomiting and Nausea Only    Other reaction(s): Vomiting     PERTINENT MEDICATIONS:  Outpatient Encounter Medications as of 09/12/2021  Medication Sig   acetaminophen (TYLENOL) 500 MG tablet Take 500 mg by mouth every 6 (six) hours as needed.   aspirin EC 81 MG tablet Take 1 tablet (81 mg total) by mouth daily. Swallow whole.   cholecalciferol (VITAMIN D3) 25 MCG (1000 UNIT) tablet Take 1,000 Units by mouth daily.   ferrous sulfate 325 (65 FE) MG tablet Take 325 mg by mouth daily with breakfast.   levofloxacin (QUIXIN) 0.5 % ophthalmic solution Place 1 drop into both eyes every 6 (six) hours.   Nutritional Supplements (FEEDING SUPPLEMENT, NEPRO CARB STEADY,) LIQD Take 237 mLs by mouth 2 (two) times daily between meals.   omeprazole (PRILOSEC) 40 MG capsule Take 40 mg by mouth daily.   QUEtiapine (SEROQUEL) 50 MG tablet TAKE 1 TABLET BY MOUTH AT BEDTIME.   SANTYL ointment Apply topically.   sertraline (ZOLOFT) 25 MG tablet TAKE 1 TABLET BY MOUTH DAILY.   No facility-administered encounter medications on file as of 09/12/2021.   Thank you for the opportunity to participate in the care  of Mr. Eoff.  The palliative care team will continue to follow. Please call our office at [phone] if we can be of additional assistance.   Ezekiel Slocumb, NP   COVID-19 PATIENT SCREENING TOOL Asked and negative response unless otherwise noted:  Have you had symptoms of covid, tested positive or been in contact with someone with symptoms/positive test in the past 5-10 days? No

## 2021-09-13 ENCOUNTER — Other Ambulatory Visit: Payer: Self-pay

## 2021-09-13 NOTE — Progress Notes (Signed)
[facility] Consult Note Telephone: [phone]  Fax: [phone]   Date of encounter: 09/12/2021 2:57PM  PATIENT NAME: Todd Trujillo [address] [address]   [phone] (home)  DOB: Jan 29, 1930 MRN: [id number] PRIMARY CARE PROVIDER:    Einar Pheasant, MD,  [address] [place] [address] [phone]  REFERRING PROVIDER:   Einar Pheasant, [address] [place],   [address] [phone]  RESPONSIBLE PARTY:    Contact Information     Name Relation Home Work [place] Daughter [phone]     Todd Trujillo Daughter [phone]     Todd Trujillo [phone]     Todd Trujillo Daughter [phone]  [phone]       Due to the COVID-19 crisis, this visit was done via telemedicine from my office and it was initiated and consent by this patient and or family.  I connected with  Todd Trujillo. OR PROXY on 09/12/21 by a video enabled telemedicine application and verified that I am speaking with the correct person using two identifiers.   I discussed the limitations of evaluation and management by telemedicine. The patient expressed understanding and agreed to proceed.   Visit made via [facility] on 09/12/21; unable to close original chart due to system malfunction.                                    ASSESSMENT AND PLAN / RECOMMENDATIONS:   Advance Care Planning/Goals of Care: Goals include to maximize quality of life and symptom management. Patient/health care surrogate gave his/her permission to discuss.Our advance care planning conversation included a discussion about:    The value and importance of advance care planning  Experiences with loved ones who have been seriously ill or have died  Exploration of personal, cultural or spiritual beliefs that might influence medical decisions  Exploration of  goals of care in the event of a sudden injury or illness  Code status will be an ongoing discussion.  CODE STATUS: Full Code  Family would like for patient to remain in the home with support. We started conversation of code status, patient's wishes and quality of life. Family would like to discuss code status further. Education provided on palliative medicine. Palliative medicine will continue to provide ongoing support.   I spent 17 minutes providing this consultation. More than 50% of the time in this consultation was spent in counseling and care coordination.  ------------------------------------------------------------------------------------------------------------------ Symptom Management/Plan:  Dementia without behaviors- continue to reorient and redirect as needed. Monitor for falls/safety. Encourage uses of walker with ambulation.   Generalized weakness-continue HH PT and OT as directed; encourage use of walker for ambulation.   Would to right heel-wound is healing per family. Continue dressing changes as directed. HH SN services   Appetite- patient's appetite is fair overall. Encourage foods patient enjoys. Monitor for swallowing difficulty. Monitor for weight loss.   Follow up Palliative Care Visit: Palliative care will continue to follow for complex medical decision making, advance care planning, and clarification of goals. Return 4-6 weeks or prn.   This visit was coded based on medical decision making (MDM).  PPS: 40%  HOSPICE ELIGIBILITY/DIAGNOSIS: TBD  Chief Complaint: Palliative Medicine initial consult.   HISTORY OF PRESENT ILLNESS:  Todd Trujillo. is a 88 y.o. year old male  with dementia without behavioral disturbance, hypertension, hyperlipidemia, cerebral  atrophy, IDA, PVD, renal cyst, weight loss, hx of cerebral infarction, hx of CVA, hx of subdural hematoma. Patient hospitalized 11/22-12/1/22; discharged to SNF, then home since 08/01/21.  Patient  resides at home with wife, family support. He is currently receiving HH PT, OT and SN services. He does have a pressure ulcer to right heel; wound is healing per family. He is now ambulating again per family; using walker. Denies pain, shortness of breath, nausea, constipation. His appetite is improving some; fair overall. He is to have an iron infusion this Friday. A 10-point review of systems is negative, except for the pertinent positives and negatives detailed per the HPI.  History obtained from review of EMR, discussion with primary team, and interview with family, facility staff/caregiver and/or Todd Trujillo.  I reviewed available labs, medications, imaging, studies and related documents from the EMR.  Records reviewed and summarized above.    Physical Exam: Constitutional: NAD General: frail appearing, thin EYES: anicteric sclera, lids intact, no discharge  ENMT: hard of hearing Pulmonary: no increased work of breathing, no cough, room air GU: deferred MSK: sarcopenia, moves all extremities, ambulatory Skin: warm and dry, no rashes or wounds on visible skin; right heel not visualized.  Neuro: generalized weakness Psych: non-anxious affect, A and O x 2 Hem/lymph/immuno: no widespread bruising  CURRENT PROBLEM LIST:  Patient Active Problem List   Diagnosis Date Noted   History of subdural hematoma 08/19/2021   Aspiration pneumonia of both lower lobes due to gastric secretions ([facility]) 06/11/2021   Dementia without behavioral disturbance ([facility]) 06/11/2021   Right femoral fracture ([facility]) 06/06/2021   Leucocytosis 06/06/2021   Obsessive behavior 03/11/2021   History of CVA (cerebrovascular accident) 09/21/2020   Renal cyst 09/21/2020   Weakness 09/21/2020   Cerebral atrophy ([facility]) 09/15/2020   Cerebrovascular accident (CVA) ([facility])    Left facial numbness 09/12/2019   HTN (hypertension) 09/12/2019   GERD (gastroesophageal reflux disease) 09/12/2019   Arthritis 08/13/2018   Benign  prostatic hyperplasia 08/13/2018   Diverticulosis 08/13/2018   History of anemia 08/13/2018   History of colonic polyps 08/13/2018   History of nephrolithiasis 08/13/2018   History of pneumonia 08/13/2018   Weight loss 12/15/2016   Healthcare maintenance 12/14/2016   Skin lesion of chest wall 09/23/2016   Anemia 09/17/2016   Hypercholesterolemia 09/17/2016   Incomplete emptying of bladder 02/02/2016   TIA (transient ischemic attack) 01/08/2016   Macular degeneration 01/08/2016   History of prostate cancer 01/08/2016   PVD (peripheral vascular disease) ([place]) 10/12/2015   SCC (squamous cell carcinoma) 01/24/2015   Bladder outlet obstruction 04/15/2013   Acquired cyst of kidney 04/16/2012   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/2012   PAST MEDICAL HISTORY:  Active Ambulatory Problems    Diagnosis Date Noted   TIA (transient ischemic attack) 01/08/2016   Macular degeneration 01/08/2016   History of prostate cancer 01/08/2016   Anemia 09/17/2016   Hypercholesterolemia 09/17/2016   Skin lesion of chest wall 09/23/2016   Healthcare maintenance 12/14/2016   Weight loss 12/15/2016   Acquired cyst of kidney 04/16/2012   Arthritis 08/13/2018   Benign prostatic hyperplasia 08/13/2018   Bladder outlet obstruction 04/15/2013   Diverticulosis 08/13/2018   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/2012   History of anemia 08/13/2018   History of colonic polyps 08/13/2018   History of nephrolithiasis 08/13/2018   History of pneumonia 08/13/2018   Incomplete emptying of bladder 02/02/2016   PVD (peripheral vascular disease) ([facility]) 10/12/2015   SCC (squamous  cell carcinoma) 01/24/2015   Left facial numbness 09/12/2019   HTN (hypertension) 09/12/2019   GERD (gastroesophageal reflux disease) 09/12/2019   Cerebrovascular accident (CVA) ([facility])    Cerebral atrophy ([facility]) 09/15/2020   History of CVA (cerebrovascular accident) 09/21/2020   Renal cyst 09/21/2020   Weakness  09/21/2020   Obsessive behavior 03/11/2021   Right femoral fracture ([place]) 06/06/2021   Leucocytosis 06/06/2021   Aspiration pneumonia of both lower lobes due to gastric secretions ([place]) 06/11/2021   Dementia without behavioral disturbance ([facility]) 06/11/2021   History of subdural hematoma 08/19/2021   Resolved Ambulatory Problems    Diagnosis Date Noted   Malignant neoplasm of prostate ([facility]) 04/16/2012   Acute hypoxemic respiratory failure ([facility]) 06/11/2021   Past Medical History:  Diagnosis Date   Cancer ([place])    Hyperlipidemia    Hypertension    Left wrist fracture    Macular degeneration of right eye    Nephrolithiasis    Prostate CA ([place]) 2003   Prostate troubles    Squamous cell carcinoma of skin 01/23/2017   SOCIAL HX:  Social History   Tobacco Use   Smoking status: Never   Smokeless tobacco: Never  Substance Use Topics   Alcohol use: No   FAMILY HX:  Family History  Problem Relation Age of Onset   Colon cancer Mother       ALLERGIES:  Allergies  Allergen Reactions   Codeine Nausea And Vomiting and Nausea Only    Other reaction(s): Vomiting     PERTINENT MEDICATIONS:  Outpatient Encounter Medications as of 09/12/2021  Medication Sig   acetaminophen (TYLENOL) 500 MG tablet Take 500 mg by mouth every 6 (six) hours as needed.   aspirin EC 81 MG tablet Take 1 tablet (81 mg total) by mouth daily. Swallow whole.   cholecalciferol (VITAMIN D3) 25 MCG (1000 UNIT) tablet Take 1,000 Units by mouth daily.   ferrous sulfate 325 (65 FE) MG tablet Take 325 mg by mouth daily with breakfast.   levofloxacin (QUIXIN) 0.5 % ophthalmic solution Place 1 drop into both eyes every 6 (six) hours.   Nutritional Supplements (FEEDING SUPPLEMENT, NEPRO CARB STEADY,) LIQD Take 237 mLs by mouth 2 (two) times daily between meals.   omeprazole (PRILOSEC) 40 MG capsule Take 40 mg by mouth daily.   QUEtiapine (SEROQUEL) 50 MG tablet TAKE 1 TABLET BY MOUTH AT BEDTIME.   SANTYL ointment Apply  topically.   sertraline (ZOLOFT) 25 MG tablet TAKE 1 TABLET BY MOUTH DAILY.   No facility-administered encounter medications on file as of 09/12/2021.   Thank you for the opportunity to participate in the care of Todd Trujillo.  The palliative care team will continue to follow. Please call our office at [phone] if we can be of additional assistance.   Ezekiel Slocumb, NP   COVID-19 PATIENT SCREENING TOOL Asked and negative response unless otherwise noted:  Have you had symptoms of covid, tested positive or been in contact with someone with symptoms/positive test in the past 5-10 days? No

## 2021-09-15 ENCOUNTER — Encounter: Payer: Self-pay | Admitting: Internal Medicine

## 2021-09-15 ENCOUNTER — Inpatient Hospital Stay: Payer: Medicare Other | Attending: Internal Medicine | Admitting: Internal Medicine

## 2021-09-15 ENCOUNTER — Inpatient Hospital Stay: Payer: Medicare Other

## 2021-09-15 ENCOUNTER — Other Ambulatory Visit: Payer: Self-pay

## 2021-09-15 VITALS — BP 140/69 | HR 68 | Temp 97.6°F | Resp 16

## 2021-09-15 DIAGNOSIS — Z85828 Personal history of other malignant neoplasm of skin: Secondary | ICD-10-CM | POA: Diagnosis not present

## 2021-09-15 DIAGNOSIS — R634 Abnormal weight loss: Secondary | ICD-10-CM | POA: Diagnosis not present

## 2021-09-15 DIAGNOSIS — I639 Cerebral infarction, unspecified: Secondary | ICD-10-CM | POA: Insufficient documentation

## 2021-09-15 DIAGNOSIS — R5383 Other fatigue: Secondary | ICD-10-CM | POA: Diagnosis not present

## 2021-09-15 DIAGNOSIS — Z803 Family history of malignant neoplasm of breast: Secondary | ICD-10-CM | POA: Insufficient documentation

## 2021-09-15 DIAGNOSIS — E785 Hyperlipidemia, unspecified: Secondary | ICD-10-CM | POA: Insufficient documentation

## 2021-09-15 DIAGNOSIS — Z79899 Other long term (current) drug therapy: Secondary | ICD-10-CM | POA: Diagnosis not present

## 2021-09-15 DIAGNOSIS — Z800 Family history of malignant neoplasm of digestive organs: Secondary | ICD-10-CM | POA: Insufficient documentation

## 2021-09-15 DIAGNOSIS — D4102 Neoplasm of uncertain behavior of left kidney: Secondary | ICD-10-CM | POA: Insufficient documentation

## 2021-09-15 DIAGNOSIS — Z8673 Personal history of transient ischemic attack (TIA), and cerebral infarction without residual deficits: Secondary | ICD-10-CM | POA: Insufficient documentation

## 2021-09-15 DIAGNOSIS — F0150 Vascular dementia without behavioral disturbance: Secondary | ICD-10-CM | POA: Insufficient documentation

## 2021-09-15 DIAGNOSIS — Z87442 Personal history of urinary calculi: Secondary | ICD-10-CM | POA: Diagnosis not present

## 2021-09-15 DIAGNOSIS — D649 Anemia, unspecified: Secondary | ICD-10-CM | POA: Diagnosis present

## 2021-09-15 DIAGNOSIS — Z7982 Long term (current) use of aspirin: Secondary | ICD-10-CM | POA: Insufficient documentation

## 2021-09-15 DIAGNOSIS — R531 Weakness: Secondary | ICD-10-CM | POA: Diagnosis not present

## 2021-09-15 DIAGNOSIS — G319 Degenerative disease of nervous system, unspecified: Secondary | ICD-10-CM | POA: Diagnosis not present

## 2021-09-15 DIAGNOSIS — I10 Essential (primary) hypertension: Secondary | ICD-10-CM | POA: Diagnosis not present

## 2021-09-15 LAB — CBC WITH DIFFERENTIAL/PLATELET
Abs Immature Granulocytes: 0.1 10*3/uL — ABNORMAL HIGH (ref 0.00–0.07)
Basophils Absolute: 0.1 10*3/uL (ref 0.0–0.1)
Basophils Relative: 1 %
Eosinophils Absolute: 0.6 10*3/uL — ABNORMAL HIGH (ref 0.0–0.5)
Eosinophils Relative: 6 %
HCT: 32.4 % — ABNORMAL LOW (ref 39.0–52.0)
Hemoglobin: 9.7 g/dL — ABNORMAL LOW (ref 13.0–17.0)
Immature Granulocytes: 1 %
Lymphocytes Relative: 29 %
Lymphs Abs: 3 10*3/uL (ref 0.7–4.0)
MCH: 26 pg (ref 26.0–34.0)
MCHC: 29.9 g/dL — ABNORMAL LOW (ref 30.0–36.0)
MCV: 86.9 fL (ref 80.0–100.0)
Monocytes Absolute: 0.8 10*3/uL (ref 0.1–1.0)
Monocytes Relative: 8 %
Neutro Abs: 5.7 10*3/uL (ref 1.7–7.7)
Neutrophils Relative %: 55 %
Platelets: 362 10*3/uL (ref 150–400)
RBC: 3.73 MIL/uL — ABNORMAL LOW (ref 4.22–5.81)
RDW: 16.5 % — ABNORMAL HIGH (ref 11.5–15.5)
WBC: 10.2 10*3/uL (ref 4.0–10.5)
nRBC: 0 % (ref 0.0–0.2)

## 2021-09-15 LAB — IRON AND TIBC
Iron: 24 ug/dL — ABNORMAL LOW (ref 45–182)
Saturation Ratios: 9 % — ABNORMAL LOW (ref 17.9–39.5)
TIBC: 265 ug/dL (ref 250–450)
UIBC: 241 ug/dL

## 2021-09-15 LAB — COMPREHENSIVE METABOLIC PANEL
ALT: 9 U/L (ref 0–44)
AST: 13 U/L — ABNORMAL LOW (ref 15–41)
Albumin: 3.3 g/dL — ABNORMAL LOW (ref 3.5–5.0)
Alkaline Phosphatase: 106 U/L (ref 38–126)
Anion gap: 5 (ref 5–15)
BUN: 22 mg/dL (ref 8–23)
CO2: 30 mmol/L (ref 22–32)
Calcium: 9.2 mg/dL (ref 8.9–10.3)
Chloride: 102 mmol/L (ref 98–111)
Creatinine, Ser: 0.86 mg/dL (ref 0.61–1.24)
GFR, Estimated: >60 mL/min (ref >60)
Glucose, Bld: 111 mg/dL — ABNORMAL HIGH (ref 70–99)
Potassium: 4.1 mmol/L (ref 3.5–5.1)
Sodium: 137 mmol/L (ref 135–145)
Total Bilirubin: 0.2 mg/dL — ABNORMAL LOW (ref 0.3–1.2)
Total Protein: 7 g/dL (ref 6.5–8.1)

## 2021-09-15 LAB — C-REACTIVE PROTEIN: CRP: 4 mg/dL — ABNORMAL HIGH (ref <1.0)

## 2021-09-15 LAB — TECHNOLOGIST SMEAR REVIEW: Plt Morphology: ADEQUATE

## 2021-09-15 LAB — LACTATE DEHYDROGENASE: LDH: 106 U/L (ref 98–192)

## 2021-09-15 LAB — RETICULOCYTES
Immature Retic Fract: 17 % — ABNORMAL HIGH (ref 2.3–15.9)
RBC.: 3.68 MIL/uL — ABNORMAL LOW (ref 4.22–5.81)
Retic Count, Absolute: 44.5 10*3/uL (ref 19.0–186.0)
Retic Ct Pct: 1.2 % (ref 0.4–3.1)

## 2021-09-15 LAB — FERRITIN: Ferritin: 45 ng/mL (ref 24–336)

## 2021-09-15 NOTE — Progress Notes (Signed)
New patient evaluation.   

## 2021-09-15 NOTE — Progress Notes (Signed)
[facility] °CONSULT NOTE ° °Patient Care Team: °Scott, Charlene, MD as PCP - General (Internal Medicine) ° °CHIEF COMPLAINTS/PURPOSE OF CONSULTATION: ANEMIA ° ° °HEMATOLOGY HISTORY ° °# ANEMIA 2022 MARCH  Hb- nadir April 2022 [6 s/p PRBC transfusion; Hip fracture; s/p Sx- stroke]  WBC/platelets- WNL;  Iron sat-4.2% ferritin 68; creatinine normal EGD/colonoscopy->> 5 years [[facility]] ° °#November 2022 stroke [rehab] ° ° Latest Reference Range & Units 08/18/21 12:30  °Iron 42 - 165 ug/dL <10 (L)  °TIBC 250.0 - 450.0 mcg/dL 238.0 (L)  °Saturation Ratios 20.0 - 50.0 % 4.2 (L)  °Ferritin 22.0 - 322.0 ng/mL 68.5  °Transferrin 212.0 - 360.0 mg/dL 170.0 (L)  °(L): Data is abnormally low ° °Hx of strokes ° °HISTORY OF PRESENTING ILLNESS: in a wheel chair; in gait belt; with daughter. HOH.  Elderly frail appearing Caucasian male patient. ° °Todd Edwin Sadlowski Jr. 88 y.o.  male pleasant patient was been referred to us for further evaluation of anemia. ° °Patient has had anemia for almost a year.  In Nov 2022- s/p fall-t hip fracture.during hospitalization noted to have anemia hemoglobin 6.  S/p PRBC transfusion.  Surgery was complicated by complicated by stroke-dysphagia.  ° °As per the daughter complains of significant weight loss. ° ° °Blood in stools:none; on PO iron black stools °Blood in urine:none °Change of bowel movement/constipation: none °Prior blood transfusion:at hip fracture; complicated by stroke °Liver disease:one °Alcohol: none °Bariatric surgery:none ° °Colonoscopy[facility]EGD ? ~ 5 yeas [? [facility]] ° °Prior IV iron infusions:none °  °Review of Systems  °Constitutional:  Positive for malaise/fatigue and weight loss. Negative for chills, diaphoresis and fever.  °HENT:  Negative for nosebleeds and sore throat.   °Eyes:  Negative for double vision.  °Respiratory:  Negative for cough, hemoptysis, sputum production, shortness of breath and wheezing.   °Cardiovascular:  Negative for chest pain, palpitations,  orthopnea and leg swelling.  °Gastrointestinal:  Negative for abdominal pain, blood in stool, constipation, diarrhea, heartburn, melena, nausea and vomiting.  °Genitourinary:  Negative for dysuria, frequency and urgency.  °Musculoskeletal:  Positive for back pain and joint pain.  °Skin: Negative.  Negative for itching and rash.  °Neurological:  Negative for dizziness, tingling, focal weakness, weakness and headaches.  °Endo/Heme/Allergies:  Does not bruise/bleed easily.  °Psychiatric/Behavioral:  Negative for depression. The patient is not nervous/anxious and does not have insomnia.   ° ° °MEDICAL HISTORY:  °Past Medical History:  °Diagnosis Date  ° Arthritis   ° Cancer (HCC)   ° prostate  ° Hyperlipidemia   ° Hypertension   ° Left wrist fracture   ° Macular degeneration of right eye   ° Nephrolithiasis   ° Prostate CA (HCC) 2003  ° Prostate troubles   ° Patient was unsure of term  ° Squamous cell carcinoma of skin 01/23/2017  ° R distal lat bicep near elbow  ° ° °SURGICAL HISTORY: °Past Surgical History:  °Procedure Laterality Date  ° INTRAMEDULLARY (IM) NAIL INTERTROCHANTERIC Right 06/07/2021  ° Procedure: INTRAMEDULLARY (IM) NAIL INTERTROCHANTRIC;  Surgeon: Krasinski, Kevin, MD;  Location: [facility] ORS;  Service: Orthopedics;  Laterality: Right;  ° TOTAL KNEE ARTHROPLASTY    ° ° °SOCIAL HISTORY: °Social History  ° °Socioeconomic History  ° Marital status: Married  °  Spouse name: Not on file  ° Number of children: Not on file  ° Years of education: Not on file  ° Highest education level: Not on file  °Occupational History  ° Not on file  °Tobacco Use  °   Smoking status: Never   Smokeless tobacco: Never  Substance and Sexual Activity   Alcohol use: No   Drug use: Not Currently   Sexual activity: Not on file  Other Topics Concern   Not on file  Social History Narrative   Patient lives at this wife; daughter; disabled granddaughter.  Limited mobility.  No smoking or alcohol.    Social Determinants of Health    Financial Resource Strain: Not on file  Food Insecurity: Not on file  Transportation Needs: Not on file  Physical Activity: Not on file  Stress: Not on file  Social Connections: Not on file  Intimate Partner Violence: Not on file    FAMILY HISTORY: Family History  Problem Relation Age of Onset   Colon cancer Mother    Esophageal cancer Brother    Breast cancer Daughter     ALLERGIES:  is allergic to codeine.  MEDICATIONS:  Current Outpatient Medications  Medication Sig Dispense Refill   acetaminophen (TYLENOL) 500 MG tablet Take 500 mg by mouth every 6 (six) hours as needed.     aspirin EC 81 MG tablet Take 1 tablet (81 mg total) by mouth daily. Swallow whole. 30 tablet 0   cholecalciferol (VITAMIN D3) 25 MCG (1000 UNIT) tablet Take 1,000 Units by mouth daily.     ferrous sulfate 325 (65 FE) MG tablet Take 325 mg by mouth daily with breakfast.     Nutritional Supplements (FEEDING SUPPLEMENT, NEPRO CARB STEADY,) LIQD Take 237 mLs by mouth 2 (two) times daily between meals.  0   omeprazole (PRILOSEC) 40 MG capsule Take 40 mg by mouth daily.     QUEtiapine (SEROQUEL) 50 MG tablet TAKE 1 TABLET BY MOUTH AT BEDTIME. 30 tablet 1   SANTYL ointment Apply topically.     sertraline (ZOLOFT) 25 MG tablet TAKE 1 TABLET BY MOUTH DAILY. 30 tablet 0   levofloxacin (QUIXIN) 0.5 % ophthalmic solution Place 1 drop into both eyes every 6 (six) hours. (Patient not taking: Reported on 09/15/2021) 5 mL 0   No current facility-administered medications for this visit.     Marland Kitchen  PHYSICAL EXAMINATION:   Vitals:   09/15/21 1400  BP: 140/69  Pulse: 68  Resp: 16  Temp: 97.6 F (36.4 C)   Filed Weights     Physical Exam Vitals and nursing note reviewed.  HENT:     Head: Normocephalic and atraumatic.     Mouth/Throat:     Pharynx: Oropharynx is clear.  Eyes:     Extraocular Movements: Extraocular movements intact.     Pupils: Pupils are equal, round, and reactive to light.   Cardiovascular:     Rate and Rhythm: Normal rate and regular rhythm.  Pulmonary:     Comments: Decreased breath sounds bilaterally.  Abdominal:     Palpations: Abdomen is soft.  Musculoskeletal:        General: Normal range of motion.     Cervical back: Normal range of motion.  Skin:    General: Skin is warm.  Neurological:     General: No focal deficit present.     Mental Status: He is alert and oriented to person, place, and time.  Psychiatric:        Behavior: Behavior normal.        Judgment: Judgment normal.     LABORATORY DATA:  I have reviewed the data as listed Lab Results  Component Value Date   WBC 10.2 09/15/2021   HGB 9.7 (L) 09/15/2021  HCT 32.4 (L) 09/15/2021  ° MCV 86.9 09/15/2021  ° PLT 362 09/15/2021  ° °Recent Labs  °  01/09/21 °1020 02/27/21 °1256 04/18/21 °1302 06/06/21 °2148 06/13/21 °0405 06/14/21 °0241 08/18/21 °1230 09/15/21 °1508  °NA 139   < > 139   < > 138 139 141 137  °K 4.8   < > 4.4   < > 4.0 3.7 4.4 4.1  °CL 105   < > 102   < > 106 110 103 102  °CO2 25   < > 28   < > 27 26 30 30  °GLUCOSE 102*   < > 85   < > 116* 108* 104* 111*  °BUN 17   < > 21   < > 36* 32* 21 22  °CREATININE 1.06   < > 1.06   < > 0.89 0.84 0.83 0.86  °CALCIUM 9.3   < > 9.1   < > 8.3* 8.1* 9.6 9.2  °GFRNONAA  --    < >  --    < > >60 >60  --  >60  °PROT 6.1   < > 5.9*   < >  --  5.2* 6.2 7.0  °ALBUMIN 3.8   < > 3.7   < >  --  2.2* 3.4* 3.3*  °AST 13   < > 12   < >  --  22 11 13*  °ALT 5   < > 5   < >  --  14 5 9  °ALKPHOS 103   < > 84   < >  --  84 119* 106  °BILITOT 0.6   < > 0.4   < >  --  1.0 0.3 0.2*  °BILIDIR 0.1  --  0.1  --   --   --  0.1  --   ° < > = values in this interval not displayed.  ° ° ° °CT HEAD WO CONTRAST (5MM) ° °Result Date: 09/04/2021 °CLINICAL DATA:  Follow-up subdural hematoma. EXAM: CT HEAD WITHOUT CONTRAST TECHNIQUE: Contiguous axial images were obtained from the base of the skull through the vertex without intravenous contrast. RADIATION DOSE REDUCTION: This exam  was performed according to the departmental dose-optimization program which includes automated exposure control, adjustment of the mA and/or kV according to patient size and/or use of iterative reconstruction technique. COMPARISON:  06/12/2021 FINDINGS: Brain: Previously seen small subacute bilateral subdural hematomas have resolved. There is atrophy and chronic small vessel disease changes. No acute intracranial abnormality. Specifically, no hemorrhage, hydrocephalus, mass lesion, acute infarction, or significant intracranial injury. Vascular: No hyperdense vessel or unexpected calcification. Skull: No acute calvarial abnormality. Sinuses/Orbits: No acute findings Other: None IMPRESSION: Resolution of previously seen small bilateral subdural hematomas. Atrophy, chronic microvascular disease. No acute intracranial abnormality. Electronically Signed   By: Kevin  Dover M.D.   On: 09/04/2021 16:12   ° °ASSESSMENT & PLAN:  ° °Symptomatic anemia °#Chronic progressive anemia [since April 2022]; the etiology of anemia is unclear.  However iron saturation is 4%; ferritin is 64.  Recommend IV iron infusion Venofer weekly x4. Discussed the potential acute infusion reactions with IV iron; which are quite rare.  Patient understands the risk; will proceed with infusions. ° °#Etiology of anemia is unclear: I had a long discussion with patient regarding multiple etiologies of anemia-including nutritional; malabsorption; chronic kidney disease; primary bone marrow disorders etc.  Recommend CBC CMP LDH peripheral smear; haptoglobin; erythropoietin; iron studies ferritin B12 folic acid; reticulocyte count; myeloma panel kappa   lambda light chain ratio.  If no obvious cause noted would recommend further work-up including a bone marrow biopsy.  However hold off bone marrow biopsy at this time.  Given his age/stroke-hold off invasive procedures at this time.  See below  #Weight loss unintentional-reasonable to proceed with CT scan of  the abdomen pelvis as the next step.   Thank you Dr.Scott for allowing me to participate in the care of your pleasant patient. Please do not hesitate to contact me with questions or concerns in the interim.  # DISPOSITION: # labs today-  # venofer weekly x3 # follow up in 3 weeks- MD; lab- cbc; CT AP prior-- possible venofer-B    All questions were answered. The patient knows to call the clinic with any problems, questions or concerns.    Cammie Sickle, MD 09/15/2021 4:53 PM

## 2021-09-15 NOTE — Assessment & Plan Note (Addendum)
#  Chronic progressive anemia [since April 2022]; the etiology of anemia is unclear.  However iron saturation is 4%; ferritin is 64.  Recommend IV iron infusion Venofer weekly x4. Discussed the potential acute infusion reactions with IV iron; which are quite rare.  Patient understands the risk; will proceed with infusions. ? ?#Etiology of anemia is unclear: I had a long discussion with patient regarding multiple etiologies of anemia-including nutritional; malabsorption; chronic kidney disease; primary bone marrow disorders etc.  Recommend CBC CMP LDH peripheral smear; haptoglobin; erythropoietin; iron studies ferritin Y40 folic acid; reticulocyte count; myeloma panel kappa lambda light chain ratio.  If no obvious cause noted would recommend further work-up including a bone marrow biopsy.  However hold off bone marrow biopsy at this time.  Given his age/stroke-hold off invasive procedures at this time.  See below ? ?#Weight loss unintentional-reasonable to proceed with CT scan of the abdomen pelvis as the next step.  ? ?Thank you Dr.Scott for allowing me to participate in the care of your pleasant patient. Please do not hesitate to contact me with questions or concerns in the interim. ? ?# DISPOSITION: ?# labs today-  ?# venofer weekly x3 ?# follow up in 3 weeks- MD; lab- cbc; CT AP prior-- possible venofer-B ?

## 2021-09-16 LAB — HAPTOGLOBIN: Haptoglobin: 283 mg/dL (ref 38–329)

## 2021-09-16 LAB — ERYTHROPOIETIN: Erythropoietin: 29.8 m[IU]/mL — ABNORMAL HIGH (ref 2.6–18.5)

## 2021-09-18 LAB — KAPPA/LAMBDA LIGHT CHAINS
Kappa free light chain: 52.8 mg/L — ABNORMAL HIGH (ref 3.3–19.4)
Kappa, lambda light chain ratio: 1.87 — ABNORMAL HIGH (ref 0.26–1.65)
Lambda free light chains: 28.3 mg/L — ABNORMAL HIGH (ref 5.7–26.3)

## 2021-09-20 LAB — MULTIPLE MYELOMA PANEL, SERUM
Albumin SerPl Elph-Mcnc: 3 g/dL (ref 2.9–4.4)
Albumin/Glob SerPl: 1 (ref 0.7–1.7)
Alpha 1: 0.4 g/dL (ref 0.0–0.4)
Alpha2 Glob SerPl Elph-Mcnc: 0.8 g/dL (ref 0.4–1.0)
B-Globulin SerPl Elph-Mcnc: 0.9 g/dL (ref 0.7–1.3)
Gamma Glob SerPl Elph-Mcnc: 1.1 g/dL (ref 0.4–1.8)
Globulin, Total: 3.3 g/dL (ref 2.2–3.9)
IgA: 246 mg/dL (ref 61–437)
IgG (Immunoglobin G), Serum: 1114 mg/dL (ref 603–1613)
IgM (Immunoglobulin M), Srm: 50 mg/dL (ref 15–143)
Total Protein ELP: 6.3 g/dL (ref 6.0–8.5)

## 2021-09-22 ENCOUNTER — Encounter: Payer: Self-pay | Admitting: Internal Medicine

## 2021-09-22 ENCOUNTER — Other Ambulatory Visit: Payer: Self-pay

## 2021-09-22 ENCOUNTER — Inpatient Hospital Stay: Payer: Medicare Other

## 2021-09-22 VITALS — BP 141/62 | HR 70 | Temp 97.6°F

## 2021-09-22 DIAGNOSIS — D649 Anemia, unspecified: Secondary | ICD-10-CM | POA: Diagnosis not present

## 2021-09-22 MED ORDER — SODIUM CHLORIDE 0.9 % IV SOLN
Freq: Once | INTRAVENOUS | Status: AC
  Filled 2021-09-22: qty 250

## 2021-09-22 MED ORDER — IRON SUCROSE 20 MG/ML IV SOLN
200.0000 mg | Freq: Once | INTRAVENOUS | Status: AC
  Administered 2021-09-22: 200 mg via INTRAVENOUS
  Filled 2021-09-22: qty 10

## 2021-09-22 MED ORDER — SODIUM CHLORIDE 0.9 % IV SOLN: 200.0000 mg | Freq: Once | INTRAVENOUS | Status: DC

## 2021-09-22 NOTE — Patient Instructions (Signed)
[facility] AT [place]  Discharge Instructions: ?Thank you for choosing [facility] to provide your oncology and hematology care.  ?If you have a lab appointment with the [facility], please go directly to the [facility] and check in at the registration area. ? ?Wear comfortable clothing and clothing appropriate for easy access to any Portacath or PICC line.  ? ?We strive to give you quality time with your provider. You may need to reschedule your appointment if you arrive late (15 or more minutes).  Arriving late affects you and other patients whose appointments are after yours.  Also, if you miss three or more appointments without notifying the office, you may be dismissed from the clinic at the provider?s discretion.    ?  ?For prescription refill requests, have your pharmacy contact our office and allow 72 hours for refills to be completed.   ? ?Today you received the following : Venofer ? ? ?Iron Sucrose Injection ?What is this medication? ?IRON SUCROSE (EYE ern SOO krose) treats low levels of iron (iron deficiency anemia) in people with kidney disease. Iron is a mineral that plays an important role in making red blood cells, which carry oxygen from your lungs to the rest of your body. ?This medicine may be used for other purposes; ask your health care provider or pharmacist if you have questions. ?COMMON BRAND NAME(S): Venofer ?What should I tell my care team before I take this medication? ?They need to know if you have any of these conditions: ?Anemia not caused by low iron levels ?Heart disease ?High levels of iron in the blood ?Kidney disease ?Liver disease ?An unusual or allergic reaction to iron, other medications, foods, dyes, or preservatives ?Pregnant or trying to get pregnant ?Breast-feeding ?How should I use this medication? ?This medication is for infusion into a vein. It is given in a hospital or clinic setting. ?Talk to your care team about the use of this  medication in children. While this medication may be prescribed for children as young as 2 years for selected conditions, precautions do apply. ?Overdosage: If you think you have taken too much of this medicine contact a poison control center or emergency room at once. ?NOTE: This medicine is only for you. Do not share this medicine with others. ?What if I miss a dose? ?It is important not to miss your dose. Call your care team if you are unable to keep an appointment. ?What may interact with this medication? ?Do not take this medication with any of the following: ?Deferoxamine ?Dimercaprol ?Other iron products ?This medication may also interact with the following: ?Chloramphenicol ?Deferasirox ?This list may not describe all possible interactions. Give your health care provider a list of all the medicines, herbs, non-prescription drugs, or dietary supplements you use. Also tell them if you smoke, drink alcohol, or use illegal drugs. Some items may interact with your medicine. ?What should I watch for while using this medication? ?Visit your care team regularly. Tell your care team if your symptoms do not start to get better or if they get worse. You may need blood work done while you are taking this medication. ?You may need to follow a special diet. Talk to your care team. Foods that contain iron include: whole grains/cereals, dried fruits, beans, or peas, leafy green vegetables, and organ meats (liver, kidney). ?What side effects may I notice from receiving this medication? ?Side effects that you should report to your care team as soon as possible: ?Allergic  reactions--skin rash, itching, hives, swelling of the face, lips, tongue, or throat ?Low blood pressure--dizziness, feeling faint or lightheaded, blurry vision ?Shortness of breath ?Side effects that usually do not require medical attention (report to your care team if they continue or are bothersome): ?Flushing ?Headache ?Joint pain ?Muscle  pain ?Nausea ?Pain, redness, or irritation at injection site ?This list may not describe all possible side effects. Call your doctor for medical advice about side effects. You may report side effects to FDA at 1-800-FDA-1088. ?Where should I keep my medication? ?This medication is given in a hospital or clinic and will not be stored at home. ?NOTE: This sheet is a summary. It may not cover all possible information. If you have questions about this medicine, talk to your doctor, pharmacist, or health care provider. ?? 2022 Elsevier/Gold Standard (2020-11-25 00:00:00) ? ?  ?To help prevent nausea and vomiting after your treatment, we encourage you to take your nausea medication as directed. ? ?BELOW ARE SYMPTOMS THAT SHOULD BE REPORTED IMMEDIATELY: ?*FEVER GREATER THAN 100.4 F (38 ?C) OR HIGHER ?*CHILLS OR SWEATING ?*NAUSEA AND VOMITING THAT IS NOT CONTROLLED WITH YOUR NAUSEA MEDICATION ?*UNUSUAL SHORTNESS OF BREATH ?*UNUSUAL BRUISING OR BLEEDING ?*URINARY PROBLEMS (pain or burning when urinating, or frequent urination) ?*BOWEL PROBLEMS (unusual diarrhea, constipation, pain near the anus) ?TENDERNESS IN MOUTH AND THROAT WITH OR WITHOUT PRESENCE OF ULCERS (sore throat, sores in mouth, or a toothache) ?UNUSUAL RASH, SWELLING OR PAIN  ?UNUSUAL VAGINAL DISCHARGE OR ITCHING  ? ?Items with * indicate a potential emergency and should be followed up as soon as possible or go to the Emergency Department if any problems should occur. ? ?Please show the CHEMOTHERAPY ALERT CARD or IMMUNOTHERAPY ALERT CARD at check-in to the Emergency Department and triage nurse. ? ?Should you have questions after your visit or need to cancel or reschedule your appointment, please contact [facility] [place] AT [place]  [phone] and follow the prompts.  Office hours are 8:00 a.m. to 4:30 p.m. Monday - Friday. Please note that voicemails left after 4:00 p.m. may not be returned until the following business day.  We are closed  weekends and major holidays. You have access to a nurse at all times for urgent questions. Please call the main number to the clinic [phone] and follow the prompts. ? ?For any non-urgent questions, you may also contact your provider using MyChart. We now offer e-Visits for anyone 28 and older to request care online for non-urgent symptoms. For details visit [email or web address]. ?  ?Also download the MyChart app! Go to the app store, search "MyChart", open the app, select [facility], and log in with your MyChart username and password. ? ?Due to Covid, a mask is required upon entering the hospital/clinic. If you do not have a mask, one will be given to you upon arrival. For doctor visits, patients may have 1 support person aged 54 or older with them. For treatment visits, patients cannot have anyone with them due to current Covid guidelines and our immunocompromised population.  ?

## 2021-09-25 ENCOUNTER — Other Ambulatory Visit: Payer: Self-pay | Admitting: Internal Medicine

## 2021-09-26 ENCOUNTER — Other Ambulatory Visit: Payer: Self-pay | Admitting: Internal Medicine

## 2021-09-26 ENCOUNTER — Telehealth: Payer: Self-pay | Admitting: Internal Medicine

## 2021-09-26 ENCOUNTER — Encounter: Payer: Self-pay | Admitting: Internal Medicine

## 2021-09-26 MED ORDER — SANTYL 250 UNIT/GM EX OINT: TOPICAL_OINTMENT | Freq: Every day | CUTANEOUS | 5 refills | Status: DC

## 2021-09-26 NOTE — Telephone Encounter (Signed)
Todd Trujillo [facility] called about ointment SANTYL . They need to know the size of the wound and how long 30 grams should last him ?

## 2021-09-26 NOTE — Telephone Encounter (Signed)
Verbals given to [place] ?

## 2021-09-26 NOTE — Telephone Encounter (Signed)
Todd Trujillo from [facility] called in to see if Dr. Nicki Reaper received the orders that was faxed over. Todd Trujillo requesting update ?

## 2021-09-26 NOTE — Telephone Encounter (Signed)
Todd Trujillo called and needs a verbal for OT,  2x a week for 4 weeks. Her number is [phone] ?

## 2021-09-29 ENCOUNTER — Other Ambulatory Visit: Payer: Self-pay

## 2021-09-29 ENCOUNTER — Inpatient Hospital Stay: Payer: Medicare Other

## 2021-09-29 VITALS — BP 107/56 | HR 67 | Temp 97.9°F | Resp 20

## 2021-09-29 DIAGNOSIS — D649 Anemia, unspecified: Secondary | ICD-10-CM

## 2021-09-29 MED ORDER — IRON SUCROSE 20 MG/ML IV SOLN
200.0000 mg | Freq: Once | INTRAVENOUS | Status: AC
  Administered 2021-09-29: 200 mg via INTRAVENOUS
  Filled 2021-09-29: qty 10

## 2021-09-29 MED ORDER — SODIUM CHLORIDE 0.9 % IV SOLN: 200.0000 mg | Freq: Once | INTRAVENOUS | Status: DC

## 2021-09-29 MED ORDER — SODIUM CHLORIDE 0.9 % IV SOLN
Freq: Once | INTRAVENOUS | Status: AC
  Filled 2021-09-29: qty 250

## 2021-09-29 NOTE — Telephone Encounter (Signed)
Pts daughter stated she does not know size of wound but pt is not out of santyl. Will call home health and follow up with wound care about size of wound and then call [facility]. ?

## 2021-09-29 NOTE — Patient Instructions (Signed)

## 2021-10-03 ENCOUNTER — Encounter: Payer: Self-pay | Admitting: Internal Medicine

## 2021-10-03 ENCOUNTER — Ambulatory Visit
Admission: RE | Admit: 2021-10-03 | Discharge: 2021-10-03 | Disposition: A | Discharge status: Home / Self Care | Payer: Medicare Other | Source: Ambulatory Visit | Attending: Internal Medicine | Admitting: Internal Medicine

## 2021-10-03 ENCOUNTER — Other Ambulatory Visit: Payer: Self-pay

## 2021-10-03 DIAGNOSIS — D649 Anemia, unspecified: Secondary | ICD-10-CM | POA: Insufficient documentation

## 2021-10-03 DIAGNOSIS — R634 Abnormal weight loss: Secondary | ICD-10-CM | POA: Insufficient documentation

## 2021-10-03 MED ORDER — IOHEXOL 300 MG/ML  SOLN
100.0000 mL | Freq: Once | INTRAMUSCULAR | Status: AC | PRN
  Administered 2021-10-03: 80 mL via INTRAVENOUS

## 2021-10-04 ENCOUNTER — Telehealth: Payer: Self-pay | Admitting: *Deleted

## 2021-10-04 NOTE — Telephone Encounter (Signed)
Called report, patient has appointment with Alease Medina, NP 10/06/21 ? ?IMPRESSION: ?Annular soft tissue mass involving the ascending colon just proximal ?to the hepatic flexure, consistent with primary colon carcinoma. ?  ?No evidence of metastatic disease. ?  ?4.5 cm complex cystic lesion arising from the upper pole of the left ?kidney, highly suspicious for cystic renal cell carcinoma. Abdomen ?MRI without and with contrast is recommended for further ?characterization. ?  ?Aortic Atherosclerosis (ICD10-I70.0). ?  ?These results will be called to the ordering clinician or ?representative by the Radiologist Assistant, and communication ?documented in the PACS or [employer]. ?  ?  ?Electronically Signed ?  By: Marlaine Hind M.D. ?  On: 10/04/2021 13:35 ?  ?

## 2021-10-04 NOTE — Telephone Encounter (Addendum)
Todd Trujillo called in requesting size of wound and how long th ointment suppose to last... Todd requesting callback at [phone] ? ?

## 2021-10-04 NOTE — Telephone Encounter (Signed)
Called number below but was on hold for >15 minutes. Wound measurements from 3/20 is 1.5cm x 1.9cm x 0.2cm. One tube of ointment lasts over a month per family. ?

## 2021-10-04 NOTE — Telephone Encounter (Signed)
You can discuss his case with me before you see him on friday

## 2021-10-05 ENCOUNTER — Encounter: Payer: Self-pay | Admitting: Internal Medicine

## 2021-10-06 ENCOUNTER — Telehealth: Payer: Self-pay | Admitting: Internal Medicine

## 2021-10-06 ENCOUNTER — Inpatient Hospital Stay (HOSPITAL_BASED_OUTPATIENT_CLINIC_OR_DEPARTMENT_OTHER): Payer: Medicare Other | Admitting: Nurse Practitioner

## 2021-10-06 ENCOUNTER — Other Ambulatory Visit: Payer: Self-pay

## 2021-10-06 ENCOUNTER — Inpatient Hospital Stay: Payer: Medicare Other

## 2021-10-06 ENCOUNTER — Encounter: Payer: Self-pay | Admitting: Nurse Practitioner

## 2021-10-06 VITALS — BP 113/57 | HR 74 | Temp 97.9°F | Ht 70.0 in | Wt 136.0 lb

## 2021-10-06 DIAGNOSIS — D649 Anemia, unspecified: Secondary | ICD-10-CM

## 2021-10-06 DIAGNOSIS — Z8546 Personal history of malignant neoplasm of prostate: Secondary | ICD-10-CM | POA: Diagnosis not present

## 2021-10-06 DIAGNOSIS — D509 Iron deficiency anemia, unspecified: Secondary | ICD-10-CM | POA: Diagnosis not present

## 2021-10-06 DIAGNOSIS — K6389 Other specified diseases of intestine: Secondary | ICD-10-CM | POA: Diagnosis not present

## 2021-10-06 DIAGNOSIS — D4102 Neoplasm of uncertain behavior of left kidney: Secondary | ICD-10-CM | POA: Diagnosis not present

## 2021-10-06 DIAGNOSIS — N2889 Other specified disorders of kidney and ureter: Secondary | ICD-10-CM

## 2021-10-06 DIAGNOSIS — R634 Abnormal weight loss: Secondary | ICD-10-CM

## 2021-10-06 LAB — CBC WITH DIFFERENTIAL/PLATELET
Abs Immature Granulocytes: 0.04 10*3/uL (ref 0.00–0.07)
Basophils Absolute: 0.1 10*3/uL (ref 0.0–0.1)
Basophils Relative: 1 %
Eosinophils Absolute: 0.6 10*3/uL — ABNORMAL HIGH (ref 0.0–0.5)
Eosinophils Relative: 5 %
HCT: 30.5 % — ABNORMAL LOW (ref 39.0–52.0)
Hemoglobin: 9.3 g/dL — ABNORMAL LOW (ref 13.0–17.0)
Immature Granulocytes: 0 %
Lymphocytes Relative: 26 %
Lymphs Abs: 3.2 10*3/uL (ref 0.7–4.0)
MCH: 26.3 pg (ref 26.0–34.0)
MCHC: 30.5 g/dL (ref 30.0–36.0)
MCV: 86.4 fL (ref 80.0–100.0)
Monocytes Absolute: 1 10*3/uL (ref 0.1–1.0)
Monocytes Relative: 8 %
Neutro Abs: 7.5 10*3/uL (ref 1.7–7.7)
Neutrophils Relative %: 60 %
Platelets: 382 10*3/uL (ref 150–400)
RBC: 3.53 MIL/uL — ABNORMAL LOW (ref 4.22–5.81)
RDW: 17.8 % — ABNORMAL HIGH (ref 11.5–15.5)
WBC: 12.4 10*3/uL — ABNORMAL HIGH (ref 4.0–10.5)
nRBC: 0 % (ref 0.0–0.2)

## 2021-10-06 MED ORDER — IRON SUCROSE 20 MG/ML IV SOLN
200.0000 mg | Freq: Once | INTRAVENOUS | Status: AC
  Administered 2021-10-06: 200 mg via INTRAVENOUS
  Filled 2021-10-06: qty 10

## 2021-10-06 MED ORDER — SODIUM CHLORIDE 0.9 % IV SOLN: 200.0000 mg | Freq: Once | INTRAVENOUS | Status: DC

## 2021-10-06 MED ORDER — SODIUM CHLORIDE 0.9 % IV SOLN
Freq: Once | INTRAVENOUS | Status: AC
  Filled 2021-10-06: qty 250

## 2021-10-06 NOTE — Progress Notes (Addendum)
[place] ?CONSULT NOTE ? ?Patient Care Team: ?Einar Pheasant, MD as PCP - General (Internal Medicine) ? ?CHIEF COMPLAINTS/PURPOSE OF CONSULTATION: ANEMIA ? ?HEMATOLOGY HISTORY ? ?# ANEMIA 2022 MARCH  Hb- nadir April 2022 [6 s/p PRBC transfusion; Hip fracture; s/p Sx- stroke]  WBC/platelets- WNL;  Iron sat-4.2% ferritin 68; creatinine normal EGD/colonoscopy->> [facility]] ? ?#November 2022 stroke [rehab] ? ? Latest Reference Range & Units 08/18/21 12:30  ?Iron 42 - 165 ug/dL <10 (L)  ?TIBC 250.0 - 450.0 mcg/dL 238.0 (L)  ?Saturation Ratios 20.0 - 50.0 % 4.2 (L)  ?Ferritin 22.0 - 322.0 ng/mL 68.5  ?Transferrin 212.0 - 360.0 mg/dL 170.0 (L)  ? ?Hx of strokes ? ?Patient has had anemia for almost a year.  In Nov 2022- s/p fall-t hip fracture.during hospitalization noted to have anemia hemoglobin 6.  S/p PRBC transfusion.  Surgery was complicated by complicated by stroke-dysphagia.  ? ?As per the daughter complains of significant weight loss. ? ?Blood in stools:none; on PO iron black stools ?Blood in urine:none ?Change of bowel movement/constipation: none ?Prior blood transfusion:at hip fracture; complicated by stroke ?Liver disease:one ?Alcohol: none ?Bariatric surgery:none ? ?Colon[facility]/EGD ? ~ [facility] years [facility]? [facility]] ? ?Prior IV iron infusions: none ? ?Interval History: in a wheel chair; in gait belt; with daughter and wife. HOH.  Elderly frail appearing Caucasian male patient. ? ?[name]. 88 y.o. male pleasant patient who returns to clinic for discussion of CT results and consideration of IV iron.  Patient is significantly HOH and has cognitive deficits. Per wife, he makes his own medical decisions but with assistance from wife and daughter. Has few complaints, majority are centered around need for frequent repositioning. Appetite is poor. He's received venofer x 2, tolerated well. Continues to have black stools which were thought to be secondary to oral iron. Daughter adds that for past  year he had complained of upper abdominal pain, nausea, and occasional vomiting after meals. He underwent surgery for hip fracture after fall in November 2022.  ?  ?Review of Systems  ?Unable to perform ROS: Dementia (ROS provided by wife & daughter)  ?Constitutional:  Positive for malaise/fatigue and weight loss. Negative for chills, diaphoresis and fever.  ?HENT:  Negative for nosebleeds and sore throat.   ?Eyes:  Negative for double vision.  ?Respiratory:  Negative for cough, hemoptysis, sputum production, shortness of breath and wheezing.   ?Cardiovascular:  Negative for chest pain, palpitations, orthopnea and leg swelling.  ?Gastrointestinal:  Negative for abdominal pain, blood in stool, constipation, diarrhea, heartburn, melena, nausea and vomiting.  ?Genitourinary:  Negative for dysuria, frequency and urgency.  ?Musculoskeletal:  Positive for back pain and joint pain.  ?Skin: Negative.  Negative for itching and rash.  ?Neurological:  Positive for weakness. Negative for dizziness, tingling, focal weakness and headaches.  ?Endo/Heme/Allergies:  Does not bruise/bleed easily.  ?Psychiatric/Behavioral:  Negative for depression. The patient is not nervous/anxious and does not have insomnia.   ? ?Patient Active Problem List  ? Diagnosis Date Noted  ? Vascular dementia ([place]) 09/15/2021  ? Benign essential hypertension 09/15/2021  ? Cerebral infarction, unspecified ([place]) 09/15/2021  ? 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 History of subdural hematoma 08/19/2021  ? Aspiration pneumonia of both lower lobes due to gastric secretions ([place]) 06/11/2021  ? Dementia without behavioral disturbance ([place]) 06/11/2021  ? Right femoral fracture ([place]) 06/06/2021  ? Leucocytosis 06/06/2021  ? Obsessive behavior 03/11/2021  ? History of CVA (cerebrovascular accident)  09/21/2020  ? Renal cyst 09/21/2020  ? Weakness 09/21/2020  ? Cerebral atrophy ([place]) 09/15/2020  ? Cerebrovascular accident (CVA) ([place])   ? Left  facial numbness 09/12/2019  ? HTN (hypertension) 09/12/2019  ? GERD (gastroesophageal reflux disease) 09/12/2019  ? Arthritis 08/13/2018  ? Benign prostatic hyperplasia 08/13/2018  ? Diverticulosis 08/13/2018  ? History of anemia 08/13/2018  ? History of colonic polyps 08/13/2018  ? History of nephrolithiasis 08/13/2018  ? History of pneumonia 08/13/2018  ? Weight loss 12/15/2016  ? Healthcare maintenance 12/14/2016  ? Skin lesion of chest wall 09/23/2016  ? Symptomatic anemia 09/17/2016  ? Hypercholesterolemia 09/17/2016  ? Incomplete emptying of bladder 02/02/2016  ? TIA (transient ischemic attack) 01/08/2016  ? Macular degeneration 01/08/2016  ? History of prostate cancer 01/08/2016  ? PVD (peripheral vascular disease) ([place]) 10/12/2015  ? SCC (squamous cell carcinoma) 01/24/2015  ? Bladder outlet obstruction 04/15/2013  ? Acquired cyst of kidney 04/16/2012  ? 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/2012  ? ? ? ?MEDICAL HISTORY:  ?Past Medical History:  ?Diagnosis Date  ? Arthritis   ? Cancer [facility])   ? prostate  ? Hyperlipidemia   ? Hypertension   ? Left wrist fracture   ? Macular degeneration of right eye   ? Nephrolithiasis   ? Prostate CA [facility]) 2003  ? Prostate troubles   ? Patient was unsure of term  ? Squamous cell carcinoma of skin 01/23/2017  ? R distal lat bicep near elbow  ? ? ?SURGICAL HISTORY: ?Past Surgical History:  ?Procedure Laterality Date  ? INTRAMEDULLARY (IM) NAIL INTERTROCHANTERIC Right 06/07/2021  ? Procedure: INTRAMEDULLARY (IM) NAIL INTERTROCHANTRIC;  Surgeon: Thornton Park, MD;  Location: [facility] ORS;  Service: Orthopedics;  Laterality: Right;  ? TOTAL KNEE ARTHROPLASTY    ? ? ?SOCIAL HISTORY: ?Social History  ? ?Socioeconomic History  ? Marital status: Married  ?  Spouse name: Not on file  ? Number of children: Not on file  ? Years of education: Not on file  ? Highest education level: Not on file  ?Occupational History  ? Not on file  ?Tobacco Use  ? Smoking status: Never  ?  Smokeless tobacco: Never  ?Vaping Use  ? Vaping Use: Never used  ?Substance and Sexual Activity  ? Alcohol use: No  ? Drug use: Not Currently  ? Sexual activity: Not on file  ?Other Topics Concern  ? Not on file  ?Social History Narrative  ? Patient lives at this wife; daughter; disabled granddaughter.  Limited mobility.  No smoking or alcohol.   ? ?Social Determinants of Health  ? ?Financial Resource Strain: Not on file  ?Food Insecurity: Not on file  ?Transportation Needs: Not on file  ?Physical Activity: Not on file  ?Stress: Not on file  ?Social Connections: Not on file  ?Intimate Partner Violence: Not on file  ? ? ?FAMILY HISTORY: ?Family History  ?Problem Relation Age of Onset  ? Colon cancer Mother   ? Esophageal cancer Brother   ? Breast cancer Daughter   ? ? ?ALLERGIES:  is allergic to codeine. ? ?MEDICATIONS:  ?Current Outpatient Medications  ?Medication Sig Dispense Refill  ? acetaminophen (TYLENOL) 500 MG tablet Take 500 mg by mouth every 6 (six) hours as needed.    ? aspirin EC 81 MG tablet Take 1 tablet (81 mg total) by mouth daily. Swallow whole. 30 tablet 0  ? cholecalciferol (VITAMIN D3) 25 MCG (1000 UNIT) tablet Take 1,000 Units by mouth daily.    ?  ferrous sulfate 325 (65 FE) MG tablet Take 325 mg by mouth daily with breakfast.    ? Nutritional Supplements (FEEDING SUPPLEMENT, NEPRO CARB STEADY,) LIQD Take 237 mLs by mouth 2 (two) times daily between meals.  0  ? omeprazole (PRILOSEC) 40 MG capsule Take 40 mg by mouth daily.    ? QUEtiapine (SEROQUEL) 50 MG tablet TAKE 1 TABLET BY MOUTH AT BEDTIME. 30 tablet 1  ? SANTYL 250 UNIT/GM ointment Apply topically daily. 15 g 5  ? sertraline (ZOLOFT) 25 MG tablet TAKE 1 TABLET BY MOUTH DAILY. 30 tablet 0  ? levofloxacin (QUIXIN) 0.5 % ophthalmic solution Place 1 drop into both eyes every 6 (six) hours. (Patient not taking: Reported on 09/15/2021) 5 mL 0  ? ?No current facility-administered medications for this visit.  ? ? ?PHYSICAL EXAMINATION: ? ? ?Vitals:   ? 10/06/21 1456  ?BP: (!) 113/57  ?Pulse: 74  ?Temp: 97.9 ?F (36.6 ?C)  ?SpO2: 100%  ? ?Filed Weights  ? 10/06/21 1456  ?Weight: 136 lb (61.7 kg)  ? ? ? ?Physical Exam ?Vitals and nursing note reviewed.  ?

## 2021-10-06 NOTE — Telephone Encounter (Signed)
Shane From [facility] called in stating that there office faxed over order for pt medical supplies... Todd Trujillo stated that there office haven't received the orders back... Todd Trujillo stated the orders were faxed on March 9th... Todd Trujillo stated the orders was for wound care supplies.Marland KitchenMarland Kitchen  ?

## 2021-10-06 NOTE — Progress Notes (Signed)
Exception was made to policy, let wife and daughter come back with pt approved by [employer]. Advised pt and family this exception would not be accommodated in the future. We explained this was due to covid 19 precautions and cancer center policy. ?

## 2021-10-06 NOTE — Patient Instructions (Signed)

## 2021-10-08 ENCOUNTER — Encounter: Payer: Self-pay | Admitting: Internal Medicine

## 2021-10-08 DIAGNOSIS — K6389 Other specified diseases of intestine: Secondary | ICD-10-CM | POA: Insufficient documentation

## 2021-10-08 DIAGNOSIS — D509 Iron deficiency anemia, unspecified: Secondary | ICD-10-CM | POA: Insufficient documentation

## 2021-10-09 ENCOUNTER — Telehealth: Payer: Self-pay

## 2021-10-09 DIAGNOSIS — K6389 Other specified diseases of intestine: Secondary | ICD-10-CM

## 2021-10-09 DIAGNOSIS — N2889 Other specified disorders of kidney and ureter: Secondary | ICD-10-CM

## 2021-10-09 DIAGNOSIS — R634 Abnormal weight loss: Secondary | ICD-10-CM

## 2021-10-09 DIAGNOSIS — D649 Anemia, unspecified: Secondary | ICD-10-CM

## 2021-10-09 NOTE — Telephone Encounter (Signed)
Dr. Diamantina Providence advised Todd Trujillo that the renal mass looks stable so he can follow up with urology as scheduled in May. ?

## 2021-10-09 NOTE — Progress Notes (Signed)
Noted  

## 2021-10-09 NOTE — Telephone Encounter (Signed)
Lauren says patient was aware of the plan (updated check out) at his 10/06/21 visit.  Please schedule as advised.  I will follow up with referral to [facility].   ?

## 2021-10-09 NOTE — Telephone Encounter (Signed)
Todd Rutter, NP updated check out to 10/06/21:  # venofer today. Schedule 2 additional doses of IV venofer (lab visit at one of thse appts to check CEA)  ?# referral to general surgery (urgent)  ?# Forward note to Dr. Diamantina Providence to see if he wishes to move up MRI/appointment  ?# Ref to nutrition/[place]  ?# follow up at [facility] with labs (cbc, cmp, ferritin, Iron studies, CEA), Dr. Tacey Ruiz, after patient has seen general surgery. Add palliative care visit same day.  ? ?Referral to [facility] entered on 10/06/21.  Referral to nutrition and palliative care entered today (10/09/21). ?

## 2021-10-10 NOTE — Telephone Encounter (Addendum)
Do you do the wound care orders fo rthis patient it looks like to me its being done by [facility]. ? ? ? ?

## 2021-10-10 NOTE — Telephone Encounter (Signed)
Message received from [place] A: Patient sees surgery on 3/30. Could we get him scheduled back at cancer center one day next week? Will need to see me or MD (if available) and will also need to see Josh (new palliative care). Thanks ? ?Please schedule as recommended and a referral for palliative and nutrition were entered yesterday. ?

## 2021-10-10 NOTE — Telephone Encounter (Signed)
Please clarify if we are prescribing or if [facility].  ?

## 2021-10-11 NOTE — Telephone Encounter (Signed)
Please schedule next week as Lauren recommends.  Thanks ?

## 2021-10-12 ENCOUNTER — Encounter: Payer: Self-pay | Admitting: Surgery

## 2021-10-12 ENCOUNTER — Ambulatory Visit (INDEPENDENT_AMBULATORY_CARE_PROVIDER_SITE_OTHER): Payer: Medicare Other | Admitting: Surgery

## 2021-10-12 ENCOUNTER — Other Ambulatory Visit: Payer: Self-pay | Admitting: Internal Medicine

## 2021-10-12 ENCOUNTER — Other Ambulatory Visit: Payer: 59 | Admitting: Student

## 2021-10-12 ENCOUNTER — Encounter: Payer: Self-pay | Admitting: Internal Medicine

## 2021-10-12 VITALS — BP 125/81 | HR 72 | Temp 98.2°F | Ht 70.0 in | Wt 135.0 lb

## 2021-10-12 DIAGNOSIS — Z515 Encounter for palliative care: Secondary | ICD-10-CM

## 2021-10-12 DIAGNOSIS — R630 Anorexia: Secondary | ICD-10-CM

## 2021-10-12 DIAGNOSIS — D509 Iron deficiency anemia, unspecified: Secondary | ICD-10-CM

## 2021-10-12 DIAGNOSIS — C182 Malignant neoplasm of ascending colon: Secondary | ICD-10-CM | POA: Diagnosis not present

## 2021-10-12 DIAGNOSIS — R531 Weakness: Secondary | ICD-10-CM

## 2021-10-12 DIAGNOSIS — F0390 Unspecified dementia without behavioral disturbance: Secondary | ICD-10-CM

## 2021-10-12 NOTE — Patient Instructions (Signed)
We will see you back next week to discuss the next decisions.  ? ? ?

## 2021-10-12 NOTE — Telephone Encounter (Signed)
This is not prescribed by [place]. Home health brings their own supplies ?

## 2021-10-12 NOTE — Progress Notes (Signed)
? ? ?Manufacturing engineer ?Community Palliative Care Consult Note ?Telephone: [phone]  ?Fax: [phone]  ? ? ?Date of encounter: 10/12/21 ?1:12 PM ?PATIENT NAME: Todd Trujillo. ?[address] ?[place] [address]   ?[id number] (home)  ?DOB: 03/07/1930 ?MRN: [id number] ?PRIMARY CARE PROVIDER:    ?Einar Pheasant, MD,  ?[address] ?[place] [address] ?[phone] ? ?REFERRING PROVIDER:   ?Einar Pheasant, MD ?[address] ?[address] ?[place],  [place] [address] ?[phone] ? ?RESPONSIBLE PARTY:    ?Contact Information   ? ? Name Relation Home Work Mobile  ? Pennington,Leigh Daughter [phone]    ? Janann August Daughter [phone]    ? Kessen,Shirley Spouse [phone]    ? Etta Grandchild Daughter [phone]  [phone]  ? ?  ? ? ? ?I met face to face with patient and family in the home. Palliative Care was asked to follow this patient by consultation request of  Einar Pheasant, MD to address advance care planning and complex medical decision making. This is a follow up visit. ? ?                                 ASSESSMENT AND PLAN / RECOMMENDATIONS:  ? ?Advance Care Planning/Goals of Care: Goals include to maximize quality of life and symptom management. Patient/health care surrogate gave his/her permission to discuss. ?Our advance care planning conversation included a discussion about:    ?The value and importance of advance care planning  ?Experiences with loved ones who have been seriously ill or have died  ?Exploration of personal, cultural or spiritual beliefs that might influence medical decisions  ?Exploration of goals of care in the event of a sudden injury or illness  ?Identification of a healthcare agent  ?Review and updating or creation of an  advance directive document . ?Decision not to resuscitate or to de-escalate disease focused treatments due to poor prognosis. ?CODE STATUS: Full Code  ? ?Ongoing discussion regarding  patient wishes. Patient does express wanting to be comfortable. We discussed quality of life and what this means to patient given his comorbidities. We reviewed MOST form and family would like to continue discussion as wife wants patient to have CPR attempted, while daughters would like for patient to be a DNR. Patient to follow up with general surgery next week regarding colonic mass. Patient is open to continuing iron or blood transfusions if needed. Palliative medicine will continue to provide. ? ?Symptom Management/Plan: ? ?Dementia without behaviors- continue to reorient and redirect as needed. Monitor for falls/safety. Encourage uses of walker with ambulation.  ?  ?Generalized weakness-continue HH PT and OT as directed; encourage use of walker for ambulation.  ? ?Iron deficiency anemia- patient to continue ferrous sulfate. Patient willing to continue iron infusions as needed. Follow up with oncology as scheduled.  ? ?Decline in appetite-encourage foods patient enjoys; continue nutritional supplements BID.  ? ?Follow up Palliative Care Visit: Palliative care will continue to follow for complex medical decision making, advance care planning, and clarification of goals. Return in 4-6 weeks or prn. ? ?I spent 60 minutes providing this consultation. More than 50% of the time in this consultation was spent in counseling and care coordination. ? ? ?PPS: 40% ? ?HOSPICE ELIGIBILITY/DIAGNOSIS: TBD ? ?Chief Complaint: Palliative Medicine follow up visit.  ? ?HISTORY OF PRESENT ILLNESS:  Todd Trujillo. is a 88 y.o. year old male  with dementia without behavioral disturbance,  hypertension, hyperlipidemia, cerebral atrophy, IDA, PVD, renal cyst, weight loss, hx of cerebral infarction, hx of CVA, hx of subdural hematoma.  ? ?Patient denies pain, shortness of breath, nausea, constipation. He endorses weakness. Continue to have [facility] PT, OT and SN HH services. He is now getting up with min assist per family.  Wound care to right heel per family. Appetite is poor to fair at best; eating foods he enjoys. CT scan on 10/03/21 shows colonic mass, left kidney lesion suspicious of renal cell carcinoma. Met with surgeon today; to follow up regarding options next week. HPI and ROS primarily obtained from family.  ? ?History obtained from review of EMR, discussion with primary team, and interview with family, facility staff/caregiver and/or Mr. Sharlett Iles.  ?I reviewed available labs, medications, imaging, studies and related documents from the EMR.  Records reviewed and summarized above.  ? ? ?Physical Exam: ? ?Constitutional: NAD ?General: frail appearing, thin ?EYES: anicteric sclera, lids intact, no discharge  ?ENMT: hard of hearing, oral mucous membranes moist ?CV: S1S2, RRR, no LE edema ?Pulmonary: LCTA, no increased work of breathing, no cough, room air ?Abdomen:  normo-active BS + 4 quadrants, soft and non tender, no ascites ?GU: deferred ?MSK: +sarcopenia, moves all extremities, ambulatory ?Skin: warm and dry, no rashes or wounds on visible skin ?Neuro: + generalized weakness ?Psych: non-anxious affect, A and O x 2 ?Hem/lymph/immuno: no widespread bruising ? ? ?Thank you for the opportunity to participate in the care of Todd Trujillo.  The palliative care team will continue to follow. Please call our office at [phone] if we can be of additional assistance.  ? ?Ezekiel Slocumb, NP  ? ?COVID-19 PATIENT SCREENING TOOL ?Asked and negative response unless otherwise noted:  ? ?Have you had symptoms of covid, tested positive or been in contact with someone with symptoms/positive test in the past 5-10 days? No ? ?

## 2021-10-13 DIAGNOSIS — C182 Malignant neoplasm of ascending colon: Secondary | ICD-10-CM | POA: Insufficient documentation

## 2021-10-13 NOTE — Progress Notes (Signed)
Patient ID: Todd Trujillo., male   DOB: [birth year], 88 y.o.   MRN: [id number] ? ?Chief Complaint: Anemia with right colonic mass noted on CT. ? ?History of Present Illness ?Todd Trujillo. is a 88 y.o. male with recent treatment for anemia.  No history of bloody stools.  No history of abdominal pain or obstructive symptoms.  CT scan screening noted annular mass of right colon near hepatic flexure.  Presents here today with daughter and wife to discuss surgical potential. ?[name] presents in a wheelchair with a stability/gait belt.  Also has a stable left renal mass per report.  General impression of frailty. ? ?Past Medical History ?Past Medical History:  ?Diagnosis Date  ? Arthritis   ? Cancer [facility])   ? prostate  ? Hyperlipidemia   ? Hypertension   ? Left wrist fracture   ? Macular degeneration of right eye   ? Nephrolithiasis   ? Prostate CA [facility]) 2003  ? Prostate troubles   ? Patient was unsure of term  ? Squamous cell carcinoma of skin 01/23/2017  ? R distal lat bicep near elbow  ?  ? ? ?Past Surgical History:  ?Procedure Laterality Date  ? INTRAMEDULLARY (IM) NAIL INTERTROCHANTERIC Right 06/07/2021  ? Procedure: INTRAMEDULLARY (IM) NAIL INTERTROCHANTRIC;  Surgeon: Thornton Park, MD;  Location: [facility] ORS;  Service: Orthopedics;  Laterality: Right;  ? TOTAL KNEE ARTHROPLASTY Right   ? ? ?Allergies  ?Allergen Reactions  ? Codeine Nausea And Vomiting and Nausea Only  ?  Other reaction(s): Vomiting  ? ? ?Current Outpatient Medications  ?Medication Sig Dispense Refill  ? acetaminophen (TYLENOL) 500 MG tablet Take 500 mg by mouth every 6 (six) hours as needed.    ? aspirin EC 81 MG tablet Take 1 tablet (81 mg total) by mouth daily. Swallow whole. 30 tablet 0  ? cholecalciferol (VITAMIN D3) 25 MCG (1000 UNIT) tablet Take 1,000 Units by mouth daily.    ? ferrous sulfate 325 (65 FE) MG tablet Take 325 mg by mouth daily with breakfast.    ? levofloxacin (QUIXIN) 0.5 % ophthalmic solution Place  1 drop into both eyes every 6 (six) hours. (Patient taking differently: Place 1 drop into both eyes daily as needed.) 5 mL 0  ? Nutritional Supplements (FEEDING SUPPLEMENT, NEPRO CARB STEADY,) LIQD Take 237 mLs by mouth 2 (two) times daily between meals.  0  ? omeprazole (PRILOSEC) 40 MG capsule Take 40 mg by mouth daily.    ? QUEtiapine (SEROQUEL) 50 MG tablet TAKE 1 TABLET BY MOUTH AT BEDTIME. 30 tablet 1  ? SANTYL 250 UNIT/GM ointment Apply topically daily. 15 g 5  ? sertraline (ZOLOFT) 25 MG tablet TAKE 1 TABLET BY MOUTH DAILY 30 tablet 0  ? ?No current facility-administered medications for this visit.  ? ? ?Family History ?Family History  ?Problem Relation Age of Onset  ? Colon cancer Mother   ? Esophageal cancer Brother   ? Breast cancer Daughter   ?  ? ? ?Social History ?Social History  ? ?Tobacco Use  ? Smoking status: Never  ?  Passive exposure: Never  ? Smokeless tobacco: Never  ?Vaping Use  ? Vaping Use: Never used  ?Substance Use Topics  ? Alcohol use: No  ? Drug use: Not Currently  ?  ?  ? ? ?Review of Systems  ?Constitutional:  Positive for weight loss.  ?HENT:  Positive for hearing loss.   ?Eyes: Negative.   ?Respiratory: Negative.    ?Cardiovascular: Negative.   ?  Gastrointestinal: Negative.   ?Genitourinary: Negative.   ?Musculoskeletal:  Positive for falls (Recent hip fracture and repair right hip November, 2022.).  ?Skin: Negative.   ?Neurological:  Positive for speech change (With associated cognitive decline).  ?  ? ?Physical Exam ?Blood pressure 125/81, pulse 72, temperature 98.2 ?F (36.8 ?C), height '5\' 10"'$  (1.778 m), weight 135 lb (61.2 kg), SpO2 99 %. ?Last Weight  Most recent update: 10/12/2021 11:21 AM  ? ? Weight  ?61.2 kg (135 lb)  ?      ? ?  ? ? ?CONSTITUTIONAL: Generally frail-appearing, developed, and relatively thin with known weight loss, appropriately responsive and aware without distress.  Limited exam with patient remaining in wheelchair. ?EYES: Sclera non-icteric.   ?EARS, NOSE,  MOUTH AND THROAT: The oropharynx is clear. Oral mucosa is pink and moist.   Hearing is intact to voice.  ?NECK: Trachea is midline, and there is no jugular venous distension.  ?LYMPH NODES:  Lymph nodes in the neck are not enlarged. ?RESPIRATORY:  Normal respiratory effort without pathologic use of accessory muscles. ?CARDIOVASCULAR: Heart is regular in rate and rhythm. ?GI: The abdomen is soft, nontender, and nondistended. MUSCULOSKELETAL:  Symmetrical muscle tone appreciated in all four extremities.    ?SKIN: Skin turgor is normal. No pathologic skin lesions appreciated.  ?NEUROLOGIC:  Motor and sensation appear grossly normal.  Cranial nerves are grossly without defect. ?PSYCH:  Alert and oriented to person, place and time. Affect is appropriate for situation. ? ?Data Reviewed ?I have personally reviewed what is currently available of the patient's imaging, recent labs and medical records.   ?Labs:  ? ?  Latest Ref Rng & Units 10/06/2021  ?  2:40 PM 09/15/2021  ?  3:08 PM 08/18/2021  ? 12:30 PM  ?CBC  ?WBC 4.0 - 10.5 K/uL 12.4   10.2   11.1    ?Hemoglobin 13.0 - 17.0 g/dL 9.3   9.7   10.1    ?Hematocrit 39.0 - 52.0 % 30.5   32.4   31.9    ?Platelets 150 - 400 K/uL 382   362   355.0    ? ? ?  Latest Ref Rng & Units 09/15/2021  ?  3:08 PM 08/18/2021  ? 12:30 PM 06/14/2021  ?  2:41 AM  ?CMP  ?Glucose 70 - 99 mg/dL 111   104   108    ?BUN 8 - 23 mg/dL 22   21   32    ?Creatinine 0.61 - 1.24 mg/dL 0.86   0.83   0.84    ?Sodium 135 - 145 mmol/L 137   141   139    ?Potassium 3.5 - 5.1 mmol/L 4.1   4.4   3.7    ?Chloride 98 - 111 mmol/L 102   103   110    ?CO2 22 - 32 mmol/L '30   30   26    '$ ?Calcium 8.9 - 10.3 mg/dL 9.2   9.6   8.1    ?Total Protein 6.5 - 8.1 g/dL 7.0   6.2   5.2    ?Total Bilirubin 0.3 - 1.2 mg/dL 0.2   0.3   1.0    ?Alkaline Phos 38 - 126 U/L 106   119   84    ?AST 15 - 41 U/L '13   11   22    '$ ?ALT 0 - 44 U/L '9   5   14    '$ ? ? ? ? ?Imaging: ?Radiology review:  ?  CLINICAL DATA:  Unintentional weight loss. Anemia.  Prior prostate ?carcinoma. ?  ?* Tracking Code: BO * ?  ?EXAM: ?CT ABDOMEN AND PELVIS WITH CONTRAST ?  ?TECHNIQUE: ?Multidetector CT imaging of the abdomen and pelvis was performed ?using the standard protocol following bolus administration of ?intravenous contrast. ?  ?RADIATION DOSE REDUCTION: This exam was performed according to the ?departmental dose-optimization program which includes automated ?exposure control, adjustment of the mA and/or kV according to ?patient size and/or use of iterative reconstruction technique. ?  ?CONTRAST:  54m OMNIPAQUE IOHEXOL 300 MG/ML  SOLN ?  ?COMPARISON:  None. ?  ?FINDINGS: ?Lower Chest: No acute findings. ?  ?Hepatobiliary: No hepatic masses identified. Tiny subcapsular cyst ?seen in the dome of the left lobe. Gallbladder is unremarkable. No ?evidence of biliary ductal dilatation. ?  ?Pancreas:  No mass or inflammatory changes. ?  ?Spleen: Within normal limits in size and appearance. ?  ?Adrenals/Urinary Tract: A subcapsular complex cystic lesion is seen ?arising from the lateral upper pole the left kidney. This measures ?4.5 x 4.0 cm and shows internal soft tissue nodularity. This is ?highly suspicious for a cystic renal cell carcinoma. No other renal ?masses are identified. No evidence of ureteral calculi or ?hydronephrosis. ?  ?Stomach/Bowel: Annular soft tissue mass is seen involving the ?ascending colon just proximal to the hepatic flexure, consistent ?with a primary colon carcinoma. No evidence of bowel obstruction. No ?evidence of inflammatory process or abnormal fluid collections. ?  ?Vascular/Lymphatic: No pathologically enlarged lymph nodes. No acute ?vascular findings. Aortic atherosclerotic calcification noted. ?  ?Reproductive: Brachytherapy seeds seen throughout the prostate ?gland. Median lobe enlargement is again seen indenting the bladder ?base. ?  ?Other:  None. ?  ?Musculoskeletal: No suspicious bone lesions identified. Internal ?fixation hardware noted in  right hip. ?  ?IMPRESSION: ?Annular soft tissue mass involving the ascending colon just proximal ?to the hepatic flexure, consistent with primary colon carcinoma. ?  ?No evidence of metastatic disease.

## 2021-10-16 ENCOUNTER — Inpatient Hospital Stay: Payer: Medicare Other | Attending: Internal Medicine

## 2021-10-16 VITALS — BP 122/60 | HR 68 | Temp 96.0°F | Resp 17

## 2021-10-16 DIAGNOSIS — E785 Hyperlipidemia, unspecified: Secondary | ICD-10-CM | POA: Diagnosis not present

## 2021-10-16 DIAGNOSIS — D509 Iron deficiency anemia, unspecified: Secondary | ICD-10-CM | POA: Diagnosis present

## 2021-10-16 DIAGNOSIS — I10 Essential (primary) hypertension: Secondary | ICD-10-CM | POA: Insufficient documentation

## 2021-10-16 DIAGNOSIS — F0150 Vascular dementia without behavioral disturbance: Secondary | ICD-10-CM | POA: Diagnosis not present

## 2021-10-16 DIAGNOSIS — Z8673 Personal history of transient ischemic attack (TIA), and cerebral infarction without residual deficits: Secondary | ICD-10-CM | POA: Diagnosis not present

## 2021-10-16 DIAGNOSIS — D649 Anemia, unspecified: Secondary | ICD-10-CM | POA: Insufficient documentation

## 2021-10-16 DIAGNOSIS — I739 Peripheral vascular disease, unspecified: Secondary | ICD-10-CM | POA: Diagnosis not present

## 2021-10-16 DIAGNOSIS — Z85828 Personal history of other malignant neoplasm of skin: Secondary | ICD-10-CM | POA: Insufficient documentation

## 2021-10-16 DIAGNOSIS — Z87442 Personal history of urinary calculi: Secondary | ICD-10-CM | POA: Insufficient documentation

## 2021-10-16 DIAGNOSIS — Z79899 Other long term (current) drug therapy: Secondary | ICD-10-CM | POA: Diagnosis not present

## 2021-10-16 DIAGNOSIS — Z7982 Long term (current) use of aspirin: Secondary | ICD-10-CM | POA: Diagnosis not present

## 2021-10-16 MED ORDER — SODIUM CHLORIDE 0.9 % IV SOLN
Freq: Once | INTRAVENOUS | Status: AC
  Filled 2021-10-16: qty 250

## 2021-10-16 MED ORDER — SODIUM CHLORIDE 0.9 % IV SOLN: 200.0000 mg | Freq: Once | INTRAVENOUS | Status: DC

## 2021-10-16 MED ORDER — IRON SUCROSE 20 MG/ML IV SOLN
200.0000 mg | Freq: Once | INTRAVENOUS | Status: AC
  Administered 2021-10-16: 200 mg via INTRAVENOUS
  Filled 2021-10-16: qty 10

## 2021-10-16 NOTE — Patient Instructions (Signed)

## 2021-10-17 ENCOUNTER — Ambulatory Visit (INDEPENDENT_AMBULATORY_CARE_PROVIDER_SITE_OTHER): Payer: Medicare Other | Admitting: Internal Medicine

## 2021-10-17 ENCOUNTER — Encounter: Payer: Self-pay | Admitting: Internal Medicine

## 2021-10-17 DIAGNOSIS — F0390 Unspecified dementia without behavioral disturbance: Secondary | ICD-10-CM

## 2021-10-17 DIAGNOSIS — I10 Essential (primary) hypertension: Secondary | ICD-10-CM | POA: Diagnosis not present

## 2021-10-17 DIAGNOSIS — C182 Malignant neoplasm of ascending colon: Secondary | ICD-10-CM | POA: Diagnosis not present

## 2021-10-17 DIAGNOSIS — Z8673 Personal history of transient ischemic attack (TIA), and cerebral infarction without residual deficits: Secondary | ICD-10-CM

## 2021-10-17 DIAGNOSIS — L989 Disorder of the skin and subcutaneous tissue, unspecified: Secondary | ICD-10-CM

## 2021-10-17 DIAGNOSIS — N2889 Other specified disorders of kidney and ureter: Secondary | ICD-10-CM

## 2021-10-17 DIAGNOSIS — G319 Degenerative disease of nervous system, unspecified: Secondary | ICD-10-CM

## 2021-10-17 DIAGNOSIS — D509 Iron deficiency anemia, unspecified: Secondary | ICD-10-CM

## 2021-10-17 DIAGNOSIS — R531 Weakness: Secondary | ICD-10-CM

## 2021-10-17 DIAGNOSIS — S91301D Unspecified open wound, right foot, subsequent encounter: Secondary | ICD-10-CM

## 2021-10-17 DIAGNOSIS — Z8546 Personal history of malignant neoplasm of prostate: Secondary | ICD-10-CM

## 2021-10-17 DIAGNOSIS — E7800 Pure hypercholesterolemia, unspecified: Secondary | ICD-10-CM

## 2021-10-17 NOTE — Progress Notes (Signed)
Patient ID: Todd Trujillo., male   DOB: [birth year], 88 y.o.   MRN: [id number] ? ? ?Subjective:  ? ? Patient ID: Todd Trujillo., male    DOB: 05/11/1930, 88 y.o.   MRN: [id number] ? ?This visit occurred during the SARS-CoV-2 public health emergency.  Safety protocols were in place, including screening questions prior to the visit, additional usage of staff PPE, and extensive cleaning of exam room while observing appropriate contact time as indicated for disinfecting solutions.  ? ?Patient here for a scheduled follow up.  ? .  ? ?HPI ?Here to follow up regarding hypertension, previous hip fracture, colon mass and anemia.  He is accompanied by his daughter.  History obtained from both of them.  Was hospitalized 06/06/21 - 06/15/21 - after a fall.  Found to have a right femoral neck fracture.  S/p surgical repair 06/07/21.  See last note.  Since his discharge, breathing better.  Is eating.  Was discharged with home health (after rehab).  Continues to work with therapy.  Is stronger.  Seeing Dr Rogue Bussing for anemia.  Receiving iron infusions.  Given weight loss, CT scan obtained and revealed a soft tissue mass involving the ascending colon and a 4.5cm complex cystic lesion of left kidney.  Saw surgery - Dr Christian Mate - 10/12/21 - to review scan. Discussed treatment options. Plans to f/u with Dr Christian Mate this week to discuss specific treatment options.  He denies any pain.  No abdominal pain.  Bowels are moving.  Is eating.  No vomiting.  Does have wart - left hand.  Request dermatology removal.  ? ? ?Past Medical History:  ?Diagnosis Date  ? Arthritis   ? Cancer [facility])   ? prostate  ? Hyperlipidemia   ? Hypertension   ? Left wrist fracture   ? Macular degeneration of right eye   ? Nephrolithiasis   ? Prostate CA [facility]) 2003  ? Prostate troubles   ? Patient was unsure of term  ? Squamous cell carcinoma of skin 01/23/2017  ? R distal lat bicep near elbow  ? ?Past Surgical History:  ?Procedure Laterality  Date  ? INTRAMEDULLARY (IM) NAIL INTERTROCHANTERIC Right 06/07/2021  ? Procedure: INTRAMEDULLARY (IM) NAIL INTERTROCHANTRIC;  Surgeon: Thornton Park, MD;  Location: [facility] ORS;  Service: Orthopedics;  Laterality: Right;  ? TOTAL KNEE ARTHROPLASTY Right   ? ?Family History  ?Problem Relation Age of Onset  ? Colon cancer Mother   ? Esophageal cancer Brother   ? Breast cancer Daughter   ? ?Social History  ? ?Socioeconomic History  ? Marital status: Married  ?  Spouse name: Not on file  ? Number of children: Not on file  ? Years of education: Not on file  ? Highest education level: Not on file  ?Occupational History  ? Not on file  ?Tobacco Use  ? Smoking status: Never  ?  Passive exposure: Never  ? Smokeless tobacco: Never  ?Vaping Use  ? Vaping Use: Never used  ?Substance and Sexual Activity  ? Alcohol use: No  ? Drug use: Not Currently  ? Sexual activity: Not on file  ?Other Topics Concern  ? Not on file  ?Social History Narrative  ? Patient lives at this wife; daughter; disabled granddaughter.  Limited mobility.  No smoking or alcohol.   ? ?Social Determinants of Health  ? ?Financial Resource Strain: Not on file  ?Food Insecurity: Not on file  ?Transportation Needs: Not on file  ?Physical Activity: Not on file  ?  Stress: Not on file  ?Social Connections: Not on file  ? ? ? ?Review of Systems  ?Constitutional:  Positive for fatigue.  ?     Has had continued weight loss.  Is eating.    ?HENT:  Negative for congestion and sinus pressure.   ?Respiratory:  Negative for cough, chest tightness and shortness of breath.   ?Cardiovascular:  Negative for chest pain, palpitations and leg swelling.  ?Gastrointestinal:  Negative for abdominal pain, diarrhea, nausea and vomiting.  ?Genitourinary:  Negative for difficulty urinating and dysuria.  ?Musculoskeletal:  Negative for joint swelling and myalgias.  ?Skin:  Negative for color change and rash.  ?Neurological:  Negative for dizziness, light-headedness and headaches.   ?Psychiatric/Behavioral:  Negative for agitation and dysphoric mood.   ? ?   ?Objective:  ?  ? ?BP 106/70   Pulse 78   Temp 97.9 ?F (36.6 ?C)   Resp 16   Wt 135 lb (61.2 kg)   SpO2 98%   BMI 19.37 kg/m?  ?Wt Readings from Last 3 Encounters:  ?10/19/21 135 lb (61.2 kg)  ?10/17/21 135 lb (61.2 kg)  ?10/12/21 135 lb (61.2 kg)  ? ? ?Physical Exam ?Constitutional:   ?   General: He is not in acute distress. ?   Appearance: Normal appearance. He is well-developed.  ?HENT:  ?   Head: Normocephalic and atraumatic.  ?   Right Ear: External ear normal.  ?   Left Ear: External ear normal.  ?Eyes:  ?   General: No scleral icterus.    ?   Right eye: No discharge.     ?   Left eye: No discharge.  ?Cardiovascular:  ?   Rate and Rhythm: Normal rate and regular rhythm.  ?Pulmonary:  ?   Effort: Pulmonary effort is normal. No respiratory distress.  ?   Breath sounds: Normal breath sounds.  ?Abdominal:  ?   General: Bowel sounds are normal.  ?   Palpations: Abdomen is soft.  ?   Tenderness: There is no abdominal tenderness.  ?Musculoskeletal:     ?   General: No swelling or tenderness.  ?   Cervical back: Neck supple. No tenderness.  ?Lymphadenopathy:  ?   Cervical: No cervical adenopathy.  ?Skin: ?   Findings: No erythema or rash.  ?Neurological:  ?   Mental Status: He is alert.  ?Psychiatric:     ?   Mood and Affect: Mood normal.     ?   Behavior: Behavior normal.  ? ? ? ?Outpatient Encounter Medications as of 10/17/2021  ?Medication Sig  ? acetaminophen (TYLENOL) 500 MG tablet Take 500 mg by mouth every 6 (six) hours as needed.  ? aspirin EC 81 MG tablet Take 1 tablet (81 mg total) by mouth daily. Swallow whole.  ? cholecalciferol (VITAMIN D3) 25 MCG (1000 UNIT) tablet Take 1,000 Units by mouth daily.  ? ferrous sulfate 325 (65 FE) MG tablet Take 325 mg by mouth daily with breakfast.  ? levofloxacin (QUIXIN) 0.5 % ophthalmic solution Place 1 drop into both eyes every 6 (six) hours. (Patient taking differently: Place 1 drop  into both eyes daily as needed.)  ? Nutritional Supplements (FEEDING SUPPLEMENT, NEPRO CARB STEADY,) LIQD Take 237 mLs by mouth 2 (two) times daily between meals.  ? omeprazole (PRILOSEC) 40 MG capsule Take 40 mg by mouth daily.  ? QUEtiapine (SEROQUEL) 50 MG tablet TAKE 1 TABLET BY MOUTH AT BEDTIME.  ? SANTYL 250 UNIT/GM ointment Apply topically daily.  ?  sertraline (ZOLOFT) 25 MG tablet TAKE 1 TABLET BY MOUTH DAILY  ? ?No facility-administered encounter medications on file as of 10/17/2021.  ?  ? ?Lab Results  ?Component Value Date  ? WBC 12.4 (H) 10/06/2021  ? HGB 9.3 (L) 10/06/2021  ? HCT 30.5 (L) 10/06/2021  ? PLT 382 10/06/2021  ? GLUCOSE 111 (H) 09/15/2021  ? CHOL 183 08/18/2021  ? TRIG 134.0 08/18/2021  ? HDL 40.20 08/18/2021  ? LDLCALC 116 (H) 08/18/2021  ? ALT 9 09/15/2021  ? AST 13 (L) 09/15/2021  ? NA 137 09/15/2021  ? K 4.1 09/15/2021  ? CL 102 09/15/2021  ? CREATININE 0.86 09/15/2021  ? BUN 22 09/15/2021  ? CO2 30 09/15/2021  ? TSH 2.03 08/18/2021  ? INR 1.5 (H) 06/06/2021  ? HGBA1C 5.5 09/13/2019  ? ? ?CT ABDOMEN PELVIS W CONTRAST ? ?Result Date: 10/04/2021 ?CLINICAL DATA:  Unintentional weight loss. Anemia. Prior prostate carcinoma. * Tracking Code: BO * EXAM: CT ABDOMEN AND PELVIS WITH CONTRAST TECHNIQUE: Multidetector CT imaging of the abdomen and pelvis was performed using the standard protocol following bolus administration of intravenous contrast. RADIATION DOSE REDUCTION: This exam was performed according to the departmental dose-optimization program which includes automated exposure control, adjustment of the mA and/or kV according to patient size and/or use of iterative reconstruction technique. CONTRAST:  64m OMNIPAQUE IOHEXOL 300 MG/ML  SOLN COMPARISON:  None. FINDINGS: Lower Chest: No acute findings. Hepatobiliary: No hepatic masses identified. Tiny subcapsular cyst seen in the dome of the left lobe. Gallbladder is unremarkable. No evidence of biliary ductal dilatation. Pancreas:  No mass or  inflammatory changes. Spleen: Within normal limits in size and appearance. Adrenals/Urinary Tract: A subcapsular complex cystic lesion is seen arising from the lateral upper pole the left kidney. This measures 4.5 x 4.

## 2021-10-18 ENCOUNTER — Ambulatory Visit: Payer: Medicare Other | Admitting: Nurse Practitioner

## 2021-10-18 ENCOUNTER — Encounter: Payer: Medicare Other | Admitting: Hospice and Palliative Medicine

## 2021-10-19 ENCOUNTER — Encounter: Payer: Self-pay | Admitting: Surgery

## 2021-10-19 ENCOUNTER — Ambulatory Visit (INDEPENDENT_AMBULATORY_CARE_PROVIDER_SITE_OTHER): Payer: Medicare Other | Admitting: Surgery

## 2021-10-19 VITALS — BP 123/65 | HR 73 | Temp 97.8°F | Ht 70.0 in | Wt 135.0 lb

## 2021-10-19 DIAGNOSIS — C182 Malignant neoplasm of ascending colon: Secondary | ICD-10-CM | POA: Diagnosis not present

## 2021-10-19 NOTE — Progress Notes (Signed)
Todd Trujillo returns today with one of his daughters, who shares the same sentiment with all of her family with exception of Todd Trujillo wife.  It is a general consensus that avoidance of a significant operation would be desirable considering his frailty. ?If not truly assessed any degree of obstruction secondary to the tumor.  And we currently do not have tissue to confirm our diagnosis, nor consider conservative measures of treatment i.e. stenting for prevention of obstruction or perhaps a palliative form of therapy perhaps to slow progression. ?We are making referral to GI, for consideration of endoscopy possible biopsy/stent placement, at least to obtain tissue diagnosis and prognosticate aggressiveness of this tumor. ?This plan was discussed thoroughly with the patient and his daughter present.  We will be glad to follow him back as needed should there be any change in plan or desire to be more aggressive. ? ? ?        ?

## 2021-10-19 NOTE — Patient Instructions (Addendum)
We can refer you for a consultation for Gastroenterology at [facility] to see if there is anything they may do via endoscopy.  ? ?They will contact you to set this appointment up.  ?

## 2021-10-21 ENCOUNTER — Encounter: Payer: Self-pay | Admitting: Internal Medicine

## 2021-10-21 DIAGNOSIS — S91309A Unspecified open wound, unspecified foot, initial encounter: Secondary | ICD-10-CM | POA: Insufficient documentation

## 2021-10-21 DIAGNOSIS — L989 Disorder of the skin and subcutaneous tissue, unspecified: Secondary | ICD-10-CM | POA: Insufficient documentation

## 2021-10-21 NOTE — Assessment & Plan Note (Signed)
Continue lisinopril.  Follow pressures.  Follow metabolic panel.  Blood pressure ok.  

## 2021-10-21 NOTE — Assessment & Plan Note (Signed)
In discussion with surgery.  Has f/u planned to week to determine next best step.  Family discussing possibility of surgery.  Eating.  Bowels moving.  No abdominal pain.  ?

## 2021-10-21 NOTE — Assessment & Plan Note (Addendum)
Being followed by wound care.  Improved.  ?

## 2021-10-21 NOTE — Assessment & Plan Note (Signed)
Seeing neurology.   

## 2021-10-21 NOTE — Assessment & Plan Note (Signed)
Lesion on left hand.  Refer to dermatology  ?

## 2021-10-21 NOTE — Assessment & Plan Note (Signed)
Continue PT.  Follow.  ?

## 2021-10-21 NOTE — Assessment & Plan Note (Signed)
Seeing hematology.  Receiving iron infusions.   ?

## 2021-10-21 NOTE — Assessment & Plan Note (Signed)
Followed by urology.   

## 2021-10-21 NOTE — Assessment & Plan Note (Signed)
History of right thalamic infarct.  Continue aspirin.  Completed course of plavix.  Continue risk factor modification. Will continue aspirin, given history.   

## 2021-10-21 NOTE — Assessment & Plan Note (Signed)
Intolerance documented to statins.  Follow.  

## 2021-10-21 NOTE — Assessment & Plan Note (Signed)
Has seen urology.  Likely RCC.  

## 2021-10-21 NOTE — Assessment & Plan Note (Signed)
Noted on recent scan.  Saw neurology.  On seroquel.  Follow    

## 2021-10-23 ENCOUNTER — Inpatient Hospital Stay: Payer: Medicare Other | Admitting: Internal Medicine

## 2021-10-23 ENCOUNTER — Inpatient Hospital Stay: Payer: Medicare Other | Admitting: Hospice and Palliative Medicine

## 2021-10-23 ENCOUNTER — Encounter: Payer: Self-pay | Admitting: Internal Medicine

## 2021-10-25 ENCOUNTER — Telehealth: Payer: Self-pay | Admitting: Internal Medicine

## 2021-10-25 NOTE — Telephone Encounter (Signed)
Attempted to schedule AWV. Unable to LVM.  Will try at later time.  

## 2021-10-26 ENCOUNTER — Inpatient Hospital Stay (HOSPITAL_BASED_OUTPATIENT_CLINIC_OR_DEPARTMENT_OTHER): Payer: Medicare Other | Admitting: Internal Medicine

## 2021-10-26 ENCOUNTER — Encounter: Payer: Self-pay | Admitting: Internal Medicine

## 2021-10-26 ENCOUNTER — Inpatient Hospital Stay: Payer: Medicare Other

## 2021-10-26 ENCOUNTER — Inpatient Hospital Stay (HOSPITAL_BASED_OUTPATIENT_CLINIC_OR_DEPARTMENT_OTHER): Payer: Medicare Other | Admitting: Hospice and Palliative Medicine

## 2021-10-26 VITALS — BP 111/52 | HR 70 | Resp 18

## 2021-10-26 DIAGNOSIS — D509 Iron deficiency anemia, unspecified: Secondary | ICD-10-CM | POA: Diagnosis not present

## 2021-10-26 DIAGNOSIS — K6389 Other specified diseases of intestine: Secondary | ICD-10-CM

## 2021-10-26 DIAGNOSIS — D649 Anemia, unspecified: Secondary | ICD-10-CM

## 2021-10-26 MED ORDER — IRON SUCROSE 20 MG/ML IV SOLN
200.0000 mg | Freq: Once | INTRAVENOUS | Status: AC
  Administered 2021-10-26: 200 mg via INTRAVENOUS
  Filled 2021-10-26: qty 10

## 2021-10-26 MED ORDER — SODIUM CHLORIDE 0.9 % IV SOLN
Freq: Once | INTRAVENOUS | Status: AC
  Filled 2021-10-26: qty 250

## 2021-10-26 MED ORDER — SODIUM CHLORIDE 0.9 % IV SOLN: 200.0000 mg | Freq: Once | INTRAVENOUS | Status: DC

## 2021-10-26 NOTE — Progress Notes (Signed)
I connected with Todd Trujillo. on 10/26/21  at 10:00 AM EDT by video enabled telemedicine visit and verified that I am speaking with the correct person using two identifiers.  ?I discussed the limitations, risks, security and privacy concerns of performing an evaluation and management service by telemedicine and the availability of in-person appointments. I also discussed with the patient that there may be a patient responsible charge related to this service. The patient expressed understanding and agreed to proceed.  ? ? ?Other persons participating in the visit and their role in the encounter: RN/medical reconciliation ?Patient?s location: home ?Provider?s location: office ? ?Oncology History  ? No history exists.  ? ? ?Chief Complaint: Kidney mass/: ? ? ?History of present illness:Todd Trujillo. 88 y.o.  male with history of multiple medical problems including anemia iron deficiency; history of stroke and newly diagnosed colon mass highly suspicious for malignancy/and also renal mass is here for follow-up. ? ?In the interim patient has been evaluated by surgery.  Patient felt poor candidate for surgery given his age and other comorbidities. ? ?Patient has been referred to GI/[facility] for further evaluation regarding endoscopic evaluation/stenting. ? ?Patient denies any abdominal pain.  Denies any blood in stools or black-colored stools.  No nausea or vomiting.  Patient also denies any blood in urine.  Complains of ongoing fatigue. ? ?Observation/objective: Alert & oriented x 3. In No acute distress.  He is in a wheelchair.  Accompanied by daughter. ? ?Assessment and plan: ?Colonic mass ?#Right colon mass-highly suspicious for malignancy given the imaging findings.  However patient not a surgical candidate status post evaluation with surgery.  Currently awaiting evaluation with [facility] for endoscopic/possible stenting for palliative reasons. ? ?#Cystic mass kidney approximately 4.5 cm in  size: Again highly suspicious for malignancy based on imaging findings.  Hold off any further work-up at this time.  ? ?#Symptomatic anemia: Secondary to blood loss from above causes.  Proceed with Venofer today.  ? ?#Palliative care evaluation: Discussed with [employer]; met with family. ? ?# DISPOSITION: ?# Venofer today ?# venofer in 1 week ?# Follow up in 3 weeks- MD; labs- cbc/cmp; CEA; possible venofer-Dr.B  ? ?Follow-up instructions: ? ?I discussed the assessment and treatment plan with the patient.  The patient was provided an opportunity to ask questions and all were answered.  The patient agreed with the plan and demonstrated understanding of instructions. ? ?The patient was advised to call back or seek an in person evaluation if the symptoms worsen or if the condition fails to improve as anticipated. ? ?Dr. Charlaine Dalton ?[facility] ?10/30/2021 ?12:37 PM ?

## 2021-10-26 NOTE — Patient Instructions (Signed)
[facility] CANCER CTR AT [place]  Discharge Instructions: ?Thank you for choosing [facility] to provide your oncology and hematology care.  ?If you have a lab appointment with the [facility], please go directly to the [facility] and check in at the registration area. ? ?Wear comfortable clothing and clothing appropriate for easy access to any Portacath or PICC line.  ? ?We strive to give you quality time with your provider. You may need to reschedule your appointment if you arrive late (15 or more minutes).  Arriving late affects you and other patients whose appointments are after yours.  Also, if you miss three or more appointments without notifying the office, you may be dismissed from the clinic at the provider?s discretion.    ?  ?For prescription refill requests, have your pharmacy contact our office and allow 72 hours for refills to be completed.   ? ?Today you received the following chemotherapy and/or immunotherapy agents VENOFER ?    ?  ?To help prevent nausea and vomiting after your treatment, we encourage you to take your nausea medication as directed. ? ?BELOW ARE SYMPTOMS THAT SHOULD BE REPORTED IMMEDIATELY: ?*FEVER GREATER THAN 100.4 F (38 ?C) OR HIGHER ?*CHILLS OR SWEATING ?*NAUSEA AND VOMITING THAT IS NOT CONTROLLED WITH YOUR NAUSEA MEDICATION ?*UNUSUAL SHORTNESS OF BREATH ?*UNUSUAL BRUISING OR BLEEDING ?*URINARY PROBLEMS (pain or burning when urinating, or frequent urination) ?*BOWEL PROBLEMS (unusual diarrhea, constipation, pain near the anus) ?TENDERNESS IN MOUTH AND THROAT WITH OR WITHOUT PRESENCE OF ULCERS (sore throat, sores in mouth, or a toothache) ?UNUSUAL RASH, SWELLING OR PAIN  ?UNUSUAL VAGINAL DISCHARGE OR ITCHING  ? ?Items with * indicate a potential emergency and should be followed up as soon as possible or go to the Emergency Department if any problems should occur. ? ?Please show the CHEMOTHERAPY ALERT CARD or IMMUNOTHERAPY ALERT CARD at check-in to  the Emergency Department and triage nurse. ? ?Should you have questions after your visit or need to cancel or reschedule your appointment, please contact [facility] [place] CANCER [place] AT [place]  [phone] and follow the prompts.  Office hours are 8:00 a.m. to 4:30 p.m. Monday - Friday. Please note that voicemails left after 4:00 p.m. may not be returned until the following business day.  We are closed weekends and major holidays. You have access to a nurse at all times for urgent questions. Please call the main number to the clinic [phone] and follow the prompts. ? ? ?Iron Sucrose Injection ?What is this medication? ?IRON SUCROSE (EYE ern SOO krose) treats low levels of iron (iron deficiency anemia) in people with kidney disease. Iron is a mineral that plays an important role in making red blood cells, which carry oxygen from your lungs to the rest of your body. ?This medicine may be used for other purposes; ask your health care provider or pharmacist if you have questions. ?COMMON BRAND NAME(S): Venofer ?What should I tell my care team before I take this medication? ?They need to know if you have any of these conditions: ?Anemia not caused by low iron levels ?Heart disease ?High levels of iron in the blood ?Kidney disease ?Liver disease ?An unusual or allergic reaction to iron, other medications, foods, dyes, or preservatives ?Pregnant or trying to get pregnant ?Breast-feeding ?How should I use this medication? ?This medication is for infusion into a vein. It is given in a hospital or clinic setting. ?Talk to your care team about the use of this medication in children. While this  medication may be prescribed for children as young as 2 years for selected conditions, precautions do apply. ?Overdosage: If you think you have taken too much of this medicine contact a poison control center or emergency room at once. ?NOTE: This medicine is only for you. Do not share this medicine with others. ?What if I  miss a dose? ?It is important not to miss your dose. Call your care team if you are unable to keep an appointment. ?What may interact with this medication? ?Do not take this medication with any of the following: ?Deferoxamine ?Dimercaprol ?Other iron products ?This medication may also interact with the following: ?Chloramphenicol ?Deferasirox ?This list may not describe all possible interactions. Give your health care provider a list of all the medicines, herbs, non-prescription drugs, or dietary supplements you use. Also tell them if you smoke, drink alcohol, or use illegal drugs. Some items may interact with your medicine. ?What should I watch for while using this medication? ?Visit your care team regularly. Tell your care team if your symptoms do not start to get better or if they get worse. You may need blood work done while you are taking this medication. ?You may need to follow a special diet. Talk to your care team. Foods that contain iron include: whole grains/cereals, dried fruits, beans, or peas, leafy green vegetables, and organ meats (liver, kidney). ?What side effects may I notice from receiving this medication? ?Side effects that you should report to your care team as soon as possible: ?Allergic reactions--skin rash, itching, hives, swelling of the face, lips, tongue, or throat ?Low blood pressure--dizziness, feeling faint or lightheaded, blurry vision ?Shortness of breath ?Side effects that usually do not require medical attention (report to your care team if they continue or are bothersome): ?Flushing ?Headache ?Joint pain ?Muscle pain ?Nausea ?Pain, redness, or irritation at injection site ?This list may not describe all possible side effects. Call your doctor for medical advice about side effects. You may report side effects to FDA at 1-800-FDA-1088. ?Where should I keep my medication? ?This medication is given in a hospital or clinic and will not be stored at home. ?NOTE: This sheet is a summary.  It may not cover all possible information. If you have questions about this medicine, talk to your doctor, pharmacist, or health care provider. ?? 2022 Elsevier/Gold Standard (2020-11-25 00:00:00) ? ?For any non-urgent questions, you may also contact your provider using MyChart. We now offer e-Visits for anyone 108 and older to request care online for non-urgent symptoms. For details visit [email or web address]. ?  ?Also download the MyChart app! Go to the app store, search "MyChart", open the app, select [facility], and log in with your MyChart username and password. ? ?Due to Covid, a mask is required upon entering the hospital/clinic. If you do not have a mask, one will be given to you upon arrival. For doctor visits, patients may have 1 support person aged 52 or older with them. For treatment visits, patients cannot have anyone with them due to current Covid guidelines and our immunocompromised population.  ?

## 2021-10-26 NOTE — Progress Notes (Signed)
Pt had surgical consult, wanted you aware.April 27th at [facility] for surgery. ?

## 2021-10-26 NOTE — Progress Notes (Signed)
? ?  ?Palliative Medicine ?[place] at [facility] ?Telephone:(336) [phone] Fax:(336) [phone] ? ? ?Name: Todd Trujillo. ?Date: 10/26/2021 ?MRN: [id number]  ?DOB: [birth year] ? ?Patient Care Team: ?Einar Pheasant, MD as PCP - General (Internal Medicine)  ? ? ?REASON FOR CONSULTATION: ?Todd Mansfield. is a 88 y.o. male with multiple medical problems including PVD, history of CVA, vascular dementia, IDA, who was recently found a colon mass and renal mass.  Patient was not thought to to be an ideal surgical candidate.  He is pending GI evaluation.  Patient referred to palliative care to help address goals. ? ?SOCIAL HISTORY:    ? reports that he has never smoked. He has never been exposed to tobacco smoke. He has never used smokeless tobacco. He reports that he does not currently use drugs. He reports that he does not drink alcohol. ? ?Patient is married and lives at home with his wife, daughter, and a disabled granddaughter.  Patient has 3 daughters.  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 ? ?ADVANCE DIRECTIVES:  ?Not on file ? ?CODE STATUS:  ? ?PAST MEDICAL HISTORY: ?Past Medical History:  ?Diagnosis Date  ? Arthritis   ? Cancer [facility])   ? prostate  ? Hyperlipidemia   ? Hypertension   ? Left wrist fracture   ? Macular degeneration of right eye   ? Nephrolithiasis   ? Prostate CA [facility]) 2003  ? Prostate troubles   ? Patient was unsure of term  ? Squamous cell carcinoma of skin 01/23/2017  ? R distal lat bicep near elbow  ? ? ?PAST SURGICAL HISTORY:  ?Past Surgical History:  ?Procedure Laterality Date  ? INTRAMEDULLARY (IM) NAIL INTERTROCHANTERIC Right 06/07/2021  ? Procedure: INTRAMEDULLARY (IM) NAIL INTERTROCHANTRIC;  Surgeon: Thornton Park, MD;  Location: [facility] ORS;  Service: Orthopedics;  Laterality: Right;  ? TOTAL KNEE ARTHROPLASTY Right   ? ? ?HEMATOLOGY/ONCOLOGY HISTORY:  ?Oncology History  ? No history exists.   ? ? ?ALLERGIES:  is allergic to codeine. ? ?MEDICATIONS:  ?Current Outpatient Medications  ?Medication Sig Dispense Refill  ? acetaminophen (TYLENOL) 500 MG tablet Take 500 mg by mouth every 6 (six) hours as needed.    ? aspirin EC 81 MG tablet Take 1 tablet (81 mg total) by mouth daily. Swallow whole. 30 tablet 0  ? cholecalciferol (VITAMIN D3) 25 MCG (1000 UNIT) tablet Take 1,000 Units by mouth daily.    ? ferrous sulfate 325 (65 FE) MG tablet Take 325 mg by mouth daily with breakfast.    ? levofloxacin (QUIXIN) 0.5 % ophthalmic solution Place 1 drop into both eyes every 6 (six) hours. (Patient taking differently: Place 1 drop into both eyes daily as needed.) 5 mL 0  ? Nutritional Supplements (FEEDING SUPPLEMENT, NEPRO CARB STEADY,) LIQD Take 237 mLs by mouth 2 (two) times daily between meals.  0  ? omeprazole (PRILOSEC) 40 MG capsule Take 40 mg by mouth daily.    ? QUEtiapine (SEROQUEL) 50 MG tablet TAKE 1 TABLET BY MOUTH AT BEDTIME. 30 tablet 1  ? SANTYL 250 UNIT/GM ointment Apply topically daily. 15 g 5  ? sertraline (ZOLOFT) 25 MG tablet TAKE 1 TABLET BY MOUTH DAILY 30 tablet 0  ? ?No current facility-administered medications for this visit.  ? ? ?VITAL SIGNS: ?There were no vitals taken for this visit. ?There were no vitals filed for this visit.  ?Estimated body mass index is 19.37 kg/m? as  calculated from the following: ?  Height as of 10/19/21: '5\' 10"'  (1.778 m). ?  Weight as of 10/19/21: 135 lb (61.2 kg). ? ?LABS: ?CBC: ?   ?Component Value Date/Time  ? WBC 12.4 (H) 10/06/2021 1440  ? HGB 9.3 (L) 10/06/2021 1440  ? HCT 30.5 (L) 10/06/2021 1440  ? PLT 382 10/06/2021 1440  ? MCV 86.4 10/06/2021 1440  ? NEUTROABS 7.5 10/06/2021 1440  ? LYMPHSABS 3.2 10/06/2021 1440  ? MONOABS 1.0 10/06/2021 1440  ? EOSABS 0.6 (H) 10/06/2021 1440  ? BASOSABS 0.1 10/06/2021 1440  ? ?Comprehensive Metabolic Panel: ?   ?Component Value Date/Time  ? NA 137 09/15/2021 1508  ? K 4.1 09/15/2021 1508  ? CL 102 09/15/2021 1508  ? CO2 30  09/15/2021 1508  ? BUN 22 09/15/2021 1508  ? CREATININE 0.86 09/15/2021 1508  ? GLUCOSE 111 (H) 09/15/2021 1508  ? CALCIUM 9.2 09/15/2021 1508  ? AST 13 (L) 09/15/2021 1508  ? ALT 9 09/15/2021 1508  ? ALKPHOS 106 09/15/2021 1508  ? BILITOT 0.2 (L) 09/15/2021 1508  ? PROT 7.0 09/15/2021 1508  ? ALBUMIN 3.3 (L) 09/15/2021 1508  ? ? ?RADIOGRAPHIC STUDIES: ?CT ABDOMEN PELVIS W CONTRAST ? ?Result Date: 10/04/2021 ?CLINICAL DATA:  Unintentional weight loss. Anemia. Prior prostate carcinoma. * Tracking Code: BO * EXAM: CT ABDOMEN AND PELVIS WITH CONTRAST TECHNIQUE: Multidetector CT imaging of the abdomen and pelvis was performed using the standard protocol following bolus administration of intravenous contrast. RADIATION DOSE REDUCTION: This exam was performed according to the departmental dose-optimization program which includes automated exposure control, adjustment of the mA and/or kV according to patient size and/or use of iterative reconstruction technique. CONTRAST:  68m OMNIPAQUE IOHEXOL 300 MG/ML  SOLN COMPARISON:  None. FINDINGS: Lower Chest: No acute findings. Hepatobiliary: No hepatic masses identified. Tiny subcapsular cyst seen in the dome of the left lobe. Gallbladder is unremarkable. No evidence of biliary ductal dilatation. Pancreas:  No mass or inflammatory changes. Spleen: Within normal limits in size and appearance. Adrenals/Urinary Tract: A subcapsular complex cystic lesion is seen arising from the lateral upper pole the left kidney. This measures 4.5 x 4.0 cm and shows internal soft tissue nodularity. This is highly suspicious for a cystic renal cell carcinoma. No other renal masses are identified. No evidence of ureteral calculi or hydronephrosis. Stomach/Bowel: Annular soft tissue mass is seen involving the ascending colon just proximal to the hepatic flexure, consistent with a primary colon carcinoma. No evidence of bowel obstruction. No evidence of inflammatory process or abnormal fluid  collections. Vascular/Lymphatic: No pathologically enlarged lymph nodes. No acute vascular findings. Aortic atherosclerotic calcification noted. Reproductive: Brachytherapy seeds seen throughout the prostate gland. Median lobe enlargement is again seen indenting the bladder base. Other:  None. Musculoskeletal: No suspicious bone lesions identified. Internal fixation hardware noted in right hip. IMPRESSION: Annular soft tissue mass involving the ascending colon just proximal to the hepatic flexure, consistent with primary colon carcinoma. No evidence of metastatic disease. 4.5 cm complex cystic lesion arising from the upper pole of the left kidney, highly suspicious for cystic renal cell carcinoma. Abdomen MRI without and with contrast is recommended for further characterization. Aortic Atherosclerosis (ICD10-I70.0). These results will be called to the ordering clinician or representative by the Radiologist Assistant, and communication documented in the PACS or [employer] Electronically Signed   By: JMarlaine HindM.D.   On: 10/04/2021 13:35   ? ?PERFORMANCE STATUS (ECOG) : 2 - Symptomatic, <50% confined to bed ? ?Review of  Systems ?Unless otherwise noted, a complete review of systems is negative. ? ?Physical Exam ?General: NAD ?Pulmonary: Unlabored ?Extremities: no edema, no joint deformities ?Skin: no rashes ?Neurological: Weakness but otherwise nonfocal, some aphasia ? ?IMPRESSION: ?I met with patient and daughter.  Patient is currently followed by community palliative care. ? ?Patient daughter spoke earlier with Dr. Rogue Bussing.  Patient is pending eval at [facility] by GI for possible consideration of colonoscopy.  Patient was seen previously by general surgery and not felt to be an ideal surgical candidate. ? ?Daughter states that patient's wife is of the mindset that she would want to be quite aggressive in regards to work-up and treatment.  However, daughters feel like they would likely be more conservative  with a focus on quality of life. ? ?Family has a MOST form at home, which they are discussing.  Reportedly, daughters would want patient to be a DNR but patient's wife would want him to remain a full code. ? ?It is unclea

## 2021-10-26 NOTE — Assessment & Plan Note (Addendum)
#  Right colon mass-highly suspicious for malignancy given the imaging findings.  However patient not a surgical candidate status post evaluation with surgery.  Currently awaiting evaluation with [facility] for endoscopic/possible stenting for palliative reasons. ? ?#Cystic mass kidney approximately 4.5 cm in size: Again highly suspicious for malignancy based on imaging findings.  Hold off any further work-up at this time.  ? ?#Symptomatic anemia: Secondary to blood loss from above causes.  Proceed with Venofer today.  ? ?#Palliative care evaluation: Discussed with [facility]; met with family. ? ?# DISPOSITION: ?# Venofer today ?# venofer in 1 week ?# Follow up in 3 weeks- MD; labs- cbc/cmp; CEA; possible venofer-Dr.B  ? ?

## 2021-10-30 ENCOUNTER — Telehealth: Payer: Self-pay | Admitting: Internal Medicine

## 2021-10-30 ENCOUNTER — Other Ambulatory Visit: Payer: Self-pay

## 2021-10-30 ENCOUNTER — Encounter: Payer: Self-pay | Admitting: Internal Medicine

## 2021-10-30 DIAGNOSIS — C182 Malignant neoplasm of ascending colon: Secondary | ICD-10-CM

## 2021-10-30 NOTE — Telephone Encounter (Signed)
pt,daughter called in to cancel appt, doesnt think its necc...KJ ?

## 2021-11-01 ENCOUNTER — Telehealth: Payer: Self-pay | Admitting: Internal Medicine

## 2021-11-01 ENCOUNTER — Inpatient Hospital Stay: Payer: Medicare Other

## 2021-11-01 NOTE — Telephone Encounter (Signed)
Tiffany from [facility] home health called about outstanding orders for frequency for the pt ?

## 2021-11-02 ENCOUNTER — Inpatient Hospital Stay: Payer: Medicare Other

## 2021-11-02 VITALS — BP 95/75 | HR 76 | Temp 96.8°F | Resp 18

## 2021-11-02 DIAGNOSIS — D509 Iron deficiency anemia, unspecified: Secondary | ICD-10-CM | POA: Diagnosis not present

## 2021-11-02 DIAGNOSIS — D649 Anemia, unspecified: Secondary | ICD-10-CM

## 2021-11-02 MED ORDER — SODIUM CHLORIDE 0.9 % IV SOLN: 200.0000 mg | Freq: Once | INTRAVENOUS | Status: DC

## 2021-11-02 MED ORDER — IRON SUCROSE 20 MG/ML IV SOLN
200.0000 mg | Freq: Once | INTRAVENOUS | Status: AC
  Administered 2021-11-02: 200 mg via INTRAVENOUS

## 2021-11-02 MED ORDER — SODIUM CHLORIDE 0.9 % IV SOLN
Freq: Once | INTRAVENOUS | Status: AC
  Filled 2021-11-02: qty 250

## 2021-11-02 NOTE — Patient Instructions (Signed)
[facility] AT [place]ICAL [facility]COLOG[facility]  Discharge Instructions: ?Thank you for choosing [facility] to provide your oncology and hematology care.  ?If you have a lab appointment with the Cancer Center, please go directly to the Cancer Center and check in at the registration area. ? ?Wear comfortable clothing and clothing appropriate for easy access to any Portacath or PICC line.  ? ?We strive to give you quality time with your provider. You may need to reschedule your appointment if you arrive late (15 or more minutes).  Arriving late affects you and other patients whose appointments are after yours.  Also, if you miss three or more appointments without notifying the office, you may be dismissed from the clinic at the provider?s discretion.    ?  ?For prescription refill requests, have your pharmacy contact our office and allow 72 hours for refills to be completed.   ? ?Today you received the following chemotherapy and/or immunotherapy agents VENOFER ?    ?  ?To help prevent nausea and vomiting after your treatment, we encourage you to take your nausea medication as directed. ? ?BELOW ARE SYMPTOMS THAT SHOULD BE REPORTED IMMEDIATELY: ?*FEVER GREATER THAN 100.4 F (38 ?C) OR HIGHER ?*CHILLS OR SWEATING ?*NAUSEA AND VOMITING THAT IS NOT CONTROLLED WITH YOUR NAUSEA MEDICATION ?*UNUSUAL SHORTNESS OF BREATH ?*UNUSUAL BRUISING OR BLEEDING ?*URINARY PROBLEMS (pain or burning when urinating, or frequent urination) ?*BOWEL PROBLEMS (unusual diarrhea, constipation, pain near the anus) ?TENDERNESS IN MOUTH AND THROAT WITH OR WITHOUT PRESENCE OF ULCERS (sore throat, sores in mouth, or a toothache) ?UNUSUAL RASH, SWELLING OR PAIN  ?UNUSUAL VAGINAL DISCHARGE OR ITCHING  ? ?Items with * indicate a potential emergency and should be followed up as soon as possible or go to the Emergency Department if any problems should occur. ? ?Please show the CHEMOTHERAPY ALERT CARD or IMMUNOTHERAPY ALERT CARD at check-in to  the Emergency Department and triage nurse. ? ?Should you have questions after your visit or need to cancel or reschedule your appointment, please contact [facility] [place]  [phone] and follow the prompts.  Office hours are 8:00 a.m. to 4:30 p.m. Monday - Friday. Please note that voicemails left after 4:00 p.m. may not be returned until the following business day.  We are closed weekends and major holidays. You have access to a nurse at all times for urgent questions. Please call the main number to the clinic [phone] and follow the prompts. ? ?For any non-urgent questions, you may also contact your provider using MyChart. We now offer e-Visits for anyone 18 and older to request care online for non-urgent symptoms. For details visit [email or web address]. ?  ?Also download the MyChart app! Go to the app store, search "MyChart", open the app, select [facility], and log in with your MyChart username and password. ? ?Due to Covid, a mask is required upon entering the hospital/clinic. If you do not have a mask, one will be given to you upon arrival. For doctor visits, patients may have 1 support person aged 18 or older with them. For treatment visits, patients cannot have anyone with them due to current Covid guidelines and our immunocompromised population.  ? ?Iron Sucrose Injection ?What is this medication? ?IRON SUCROSE (EYE ern SOO krose) treats low levels of iron (iron deficiency anemia) in people with kidney disease. Iron is a mineral that plays an important role in making red blood cells, which carry oxygen from your lungs to the rest of your body. ?This medicine   may be used for other purposes; ask your health care provider or pharmacist if you have questions. ?COMMON BRAND NAME(S): Venofer ?What should I tell my care team before I take this medication? ?They need to know if you have any of these conditions: ?Anemia not caused by low iron levels ?Heart disease ?High levels of  iron in the blood ?Kidney disease ?Liver disease ?An unusual or allergic reaction to iron, other medications, foods, dyes, or preservatives ?Pregnant or trying to get pregnant ?Breast-feeding ?How should I use this medication? ?This medication is for infusion into a vein. It is given in a hospital or clinic setting. ?Talk to your care team about the use of this medication in children. While this medication may be prescribed for children as young as 2 years for selected conditions, precautions do apply. ?Overdosage: If you think you have taken too much of this medicine contact a poison control center or emergency room at once. ?NOTE: This medicine is only for you. Do not share this medicine with others. ?What if I miss a dose? ?It is important not to miss your dose. Call your care team if you are unable to keep an appointment. ?What may interact with this medication? ?Do not take this medication with any of the following: ?Deferoxamine ?Dimercaprol ?Other iron products ?This medication may also interact with the following: ?Chloramphenicol ?Deferasirox ?This list may not describe all possible interactions. Give your health care provider a list of all the medicines, herbs, non-prescription drugs, or dietary supplements you use. Also tell them if you smoke, drink alcohol, or use illegal drugs. Some items may interact with your medicine. ?What should I watch for while using this medication? ?Visit your care team regularly. Tell your care team if your symptoms do not start to get better or if they get worse. You may need blood work done while you are taking this medication. ?You may need to follow a special diet. Talk to your care team. Foods that contain iron include: whole grains/cereals, dried fruits, beans, or peas, leafy green vegetables, and organ meats (liver, kidney). ?What side effects may I notice from receiving this medication? ?Side effects that you should report to your care team as soon as  possible: ?Allergic reactions--skin rash, itching, hives, swelling of the face, lips, tongue, or throat ?Low blood pressure--dizziness, feeling faint or lightheaded, blurry vision ?Shortness of breath ?Side effects that usually do not require medical attention (report to your care team if they continue or are bothersome): ?Flushing ?Headache ?Joint pain ?Muscle pain ?Nausea ?Pain, redness, or irritation at injection site ?This list may not describe all possible side effects. Call your doctor for medical advice about side effects. You may report side effects to FDA at 1-800-FDA-1088. ?Where should I keep my medication? ?This medication is given in a hospital or clinic and will not be stored at home. ?NOTE: This sheet is a summary. It may not cover all possible information. If you have questions about this medicine, talk to your doctor, pharmacist, or health care provider. ?? 2023 Elsevier/Gold Standard (2020-11-25 00:00:00) ? ?

## 2021-11-03 ENCOUNTER — Telehealth: Payer: Self-pay

## 2021-11-03 NOTE — Telephone Encounter (Signed)
S/w Tiffany , stated orders were faxed to [place] 3/20 and 3/23. ?Never returned. ? ?Requested to have orders faxed to my attention to have signed and sent.  ?

## 2021-11-03 NOTE — Telephone Encounter (Signed)
Do we have these orders have they been signed and faxed? ?

## 2021-11-03 NOTE — Telephone Encounter (Signed)
Working on this

## 2021-11-03 NOTE — Telephone Encounter (Signed)
Tiffany called about outstanding home health orders ? ?

## 2021-11-04 NOTE — Telephone Encounter (Signed)
I have signed orders on Todd Trujillo.  I am not sure which orders they are requesting.  ?

## 2021-11-07 ENCOUNTER — Encounter: Payer: Self-pay | Admitting: Internal Medicine

## 2021-11-07 NOTE — Telephone Encounter (Signed)
Please call and confirm no sob or chest congestion.  Per note - more sinus.  If nasal and sinus congestion and no other acute symptoms - can try saline nasal spray - flush nose a couple of times per day.  Nasacort nasal spray - 2 sprays each nostril one time per day.  Can take mucinex - to help keep mucus loose and keep from settling  ?

## 2021-11-07 NOTE — Telephone Encounter (Signed)
FAXED

## 2021-11-08 ENCOUNTER — Other Ambulatory Visit: Payer: Self-pay | Admitting: Internal Medicine

## 2021-11-14 ENCOUNTER — Other Ambulatory Visit: Payer: Self-pay | Admitting: Internal Medicine

## 2021-11-16 ENCOUNTER — Inpatient Hospital Stay: Payer: Medicare Other

## 2021-11-16 ENCOUNTER — Inpatient Hospital Stay (HOSPITAL_BASED_OUTPATIENT_CLINIC_OR_DEPARTMENT_OTHER): Payer: Medicare Other | Admitting: Internal Medicine

## 2021-11-16 ENCOUNTER — Inpatient Hospital Stay: Payer: Medicare Other | Attending: Internal Medicine

## 2021-11-16 ENCOUNTER — Encounter: Payer: Self-pay | Admitting: Internal Medicine

## 2021-11-16 VITALS — BP 142/61 | HR 80

## 2021-11-16 DIAGNOSIS — R1903 Right lower quadrant abdominal swelling, mass and lump: Secondary | ICD-10-CM | POA: Insufficient documentation

## 2021-11-16 DIAGNOSIS — K5900 Constipation, unspecified: Secondary | ICD-10-CM | POA: Insufficient documentation

## 2021-11-16 DIAGNOSIS — J209 Acute bronchitis, unspecified: Secondary | ICD-10-CM | POA: Insufficient documentation

## 2021-11-16 DIAGNOSIS — Z8546 Personal history of malignant neoplasm of prostate: Secondary | ICD-10-CM | POA: Diagnosis not present

## 2021-11-16 DIAGNOSIS — N2889 Other specified disorders of kidney and ureter: Secondary | ICD-10-CM | POA: Diagnosis not present

## 2021-11-16 DIAGNOSIS — Z7982 Long term (current) use of aspirin: Secondary | ICD-10-CM | POA: Insufficient documentation

## 2021-11-16 DIAGNOSIS — D649 Anemia, unspecified: Secondary | ICD-10-CM

## 2021-11-16 DIAGNOSIS — D500 Iron deficiency anemia secondary to blood loss (chronic): Secondary | ICD-10-CM | POA: Diagnosis not present

## 2021-11-16 DIAGNOSIS — Z79899 Other long term (current) drug therapy: Secondary | ICD-10-CM | POA: Diagnosis not present

## 2021-11-16 DIAGNOSIS — I10 Essential (primary) hypertension: Secondary | ICD-10-CM | POA: Insufficient documentation

## 2021-11-16 DIAGNOSIS — Z85828 Personal history of other malignant neoplasm of skin: Secondary | ICD-10-CM | POA: Insufficient documentation

## 2021-11-16 DIAGNOSIS — R54 Age-related physical debility: Secondary | ICD-10-CM | POA: Insufficient documentation

## 2021-11-16 DIAGNOSIS — Z800 Family history of malignant neoplasm of digestive organs: Secondary | ICD-10-CM | POA: Diagnosis not present

## 2021-11-16 DIAGNOSIS — Z87442 Personal history of urinary calculi: Secondary | ICD-10-CM | POA: Insufficient documentation

## 2021-11-16 DIAGNOSIS — Z803 Family history of malignant neoplasm of breast: Secondary | ICD-10-CM | POA: Diagnosis not present

## 2021-11-16 DIAGNOSIS — R634 Abnormal weight loss: Secondary | ICD-10-CM

## 2021-11-16 DIAGNOSIS — E785 Hyperlipidemia, unspecified: Secondary | ICD-10-CM | POA: Insufficient documentation

## 2021-11-16 DIAGNOSIS — K6389 Other specified diseases of intestine: Secondary | ICD-10-CM

## 2021-11-16 LAB — CBC WITH DIFFERENTIAL/PLATELET
Abs Immature Granulocytes: 0.07 10*3/uL (ref 0.00–0.07)
Basophils Absolute: 0.1 10*3/uL (ref 0.0–0.1)
Basophils Relative: 1 %
Eosinophils Absolute: 0.4 10*3/uL (ref 0.0–0.5)
Eosinophils Relative: 3 %
HCT: 32 % — ABNORMAL LOW (ref 39.0–52.0)
Hemoglobin: 9.8 g/dL — ABNORMAL LOW (ref 13.0–17.0)
Immature Granulocytes: 1 %
Lymphocytes Relative: 18 %
Lymphs Abs: 2.5 10*3/uL (ref 0.7–4.0)
MCH: 26.8 pg (ref 26.0–34.0)
MCHC: 30.6 g/dL (ref 30.0–36.0)
MCV: 87.7 fL (ref 80.0–100.0)
Monocytes Absolute: 1.1 10*3/uL — ABNORMAL HIGH (ref 0.1–1.0)
Monocytes Relative: 8 %
Neutro Abs: 9.8 10*3/uL — ABNORMAL HIGH (ref 1.7–7.7)
Neutrophils Relative %: 69 %
Platelets: 392 10*3/uL (ref 150–400)
RBC: 3.65 MIL/uL — ABNORMAL LOW (ref 4.22–5.81)
RDW: 18.2 % — ABNORMAL HIGH (ref 11.5–15.5)
WBC: 13.9 10*3/uL — ABNORMAL HIGH (ref 4.0–10.5)
nRBC: 0 % (ref 0.0–0.2)

## 2021-11-16 LAB — IRON AND TIBC
Iron: 15 ug/dL — ABNORMAL LOW (ref 45–182)
Saturation Ratios: 8 % — ABNORMAL LOW (ref 17.9–39.5)
TIBC: 188 ug/dL — ABNORMAL LOW (ref 250–450)
UIBC: 173 ug/dL

## 2021-11-16 LAB — COMPREHENSIVE METABOLIC PANEL
ALT: 10 U/L (ref 0–44)
AST: 18 U/L (ref 15–41)
Albumin: 3.3 g/dL — ABNORMAL LOW (ref 3.5–5.0)
Alkaline Phosphatase: 110 U/L (ref 38–126)
Anion gap: 6 (ref 5–15)
BUN: 22 mg/dL (ref 8–23)
CO2: 27 mmol/L (ref 22–32)
Calcium: 9 mg/dL (ref 8.9–10.3)
Chloride: 103 mmol/L (ref 98–111)
Creatinine, Ser: 0.91 mg/dL (ref 0.61–1.24)
GFR, Estimated: >60 mL/min (ref >60)
Glucose, Bld: 120 mg/dL — ABNORMAL HIGH (ref 70–99)
Potassium: 4.4 mmol/L (ref 3.5–5.1)
Sodium: 136 mmol/L (ref 135–145)
Total Bilirubin: 0.8 mg/dL (ref 0.3–1.2)
Total Protein: 7.4 g/dL (ref 6.5–8.1)

## 2021-11-16 LAB — FERRITIN: Ferritin: 267 ng/mL (ref 24–336)

## 2021-11-16 MED ORDER — AMOXICILLIN 500 MG PO CAPS: 500.0000 mg | ORAL_CAPSULE | Freq: Three times a day (TID) | ORAL | 0 refills | Status: DC

## 2021-11-16 MED ORDER — SODIUM CHLORIDE 0.9 % IV SOLN
Freq: Once | INTRAVENOUS | Status: AC
  Filled 2021-11-16: qty 250

## 2021-11-16 MED ORDER — SODIUM CHLORIDE 0.9 % IV SOLN: 200.0000 mg | Freq: Once | INTRAVENOUS | Status: DC

## 2021-11-16 MED ORDER — IRON SUCROSE 20 MG/ML IV SOLN
200.0000 mg | Freq: Once | INTRAVENOUS | Status: AC
  Administered 2021-11-16: 200 mg via INTRAVENOUS
  Filled 2021-11-16: qty 10

## 2021-11-16 NOTE — Telephone Encounter (Signed)
Refill sent in.  If persistent problems, will need to be evaluated.  ?

## 2021-11-16 NOTE — Assessment & Plan Note (Addendum)
#  Right colon mass-highly suspicious for malignancy given the imaging findings.  However patient not a surgical candidate status post evaluation with surgery.  Currently awaiting evaluation with [facility] for endoscopic/possible stenting for palliative reasons.  Discussed with Dr.Spaete re: the procedure planned don 5/15.  No clinical signs of obstruction at this time. ? ?#Cystic mass Left kidney approximately 4.5 cm in size: Again highly suspicious for malignancy based on imaging findings.  Awaiting urology evaluation in the next 2 weeks.  ? ?# Symptomatic anemia: Secondary to blood loss from above causes-hemoglobin today is 9.4..  Proceed with Venofer today.  ? ?#Acute bronchitis-recommend proceeding with amoxicillin 500 mg 3 times daily; continue over-the-counter cough syrup. ? ?# DISPOSITION: ?# Venofer today ?# Follow up in 3 weeks- MD; labs- cbc/cmp;  possible venofer-Dr.B  ? ?

## 2021-11-16 NOTE — Progress Notes (Signed)
[place] ?CONSULT NOTE ? ?Patient Care Team: ?Todd Pheasant, MD as PCP - General (Internal Medicine) ? ?CHIEF COMPLAINTS/PURPOSE OF CONSULTATION: ANEMIA ? ? ?HEMATOLOGY HISTORY ? ?# ANEMIA 2022 MARCH  Hb- nadir April 2022 [6 s/p PRBC transfusion; Hip fracture; s/p Sx- stroke]  WBC/platelets- WNL;  Iron sat-4.2% ferritin 68; creatinine normal [facility] ? ?# MARCH 2023-CT scan abdomen pelvis-right-sided colon mass with history of malignancy; left-sided kidney mass 4.5 cm suggestive of malignancy ? ?#November 2022 stroke [rehab] ? ? Latest Reference Range & Units 08/18/21 12:30  ?Iron 42 - 165 ug/dL <10 (L)  ?TIBC 250.0 - 450.0 mcg/dL 238.0 (L)  ?Saturation Ratios 20.0 - 50.0 % 4.2 (L)  ?Ferritin 22.0 - 322.0 ng/mL 68.5  ?Transferrin 212.0 - 360.0 mg/dL 170.0 (L)  ?(L): Data is abnormally low ? ?Hx of strokes ? ?HISTORY OF PRESENTING ILLNESS: in a wheel chair; in gait belt; with daughter. HOH.  Elderly frail appearing Caucasian male patient. ? ?[name]. 88 y.o.  male pleasant patient right-sided colon mass suggestive of colon cancer; left kidney mass again suggestive of malignancy is here for follow-up. ? ?As per the daughter patient had a recent "head cold"; and hence this procedure/colonoscopy possible stenting acute was postponed.  Denies any worsening abdominal pain nausea vomiting. ? ?Complains of mild cough.  No fevers or Chills.  No worsening shortness of breath.  ? ?As per the daughter complains of significant weight loss. ? ?  ?Review of Systems  ?Constitutional:  Positive for malaise/fatigue and weight loss. Negative for chills, diaphoresis and fever.  ?HENT:  Negative for nosebleeds and sore throat.   ?Eyes:  Negative for double vision.  ?Respiratory:  Negative for cough, hemoptysis, sputum production, shortness of breath and wheezing.   ?Cardiovascular:  Negative for chest pain, palpitations, orthopnea and leg swelling.  ?Gastrointestinal:   Negative for abdominal pain, blood in stool, constipation, diarrhea, heartburn, melena, nausea and vomiting.  ?Genitourinary:  Negative for dysuria, frequency and urgency.  ?Musculoskeletal:  Positive for back pain and joint pain.  ?Skin: Negative.  Negative for itching and rash.  ?Neurological:  Negative for dizziness, tingling, focal weakness, weakness and headaches.  ?Endo/Heme/Allergies:  Does not bruise/bleed easily.  ?Psychiatric/Behavioral:  Negative for depression. The patient is not nervous/anxious and does not have insomnia.   ? ? ?MEDICAL HISTORY:  ?Past Medical History:  ?Diagnosis Date  ? Arthritis   ? Cancer [facility])   ? prostate  ? Hyperlipidemia   ? Hypertension   ? Left wrist fracture   ? Macular degeneration of right eye   ? Nephrolithiasis   ? Prostate CA [facility]) 2003  ? Prostate troubles   ? Patient was unsure of term  ? Squamous cell carcinoma of skin 01/23/2017  ? R distal lat bicep near elbow  ? ? ?SURGICAL HISTORY: ?Past Surgical History:  ?Procedure Laterality Date  ? INTRAMEDULLARY (IM) NAIL INTERTROCHANTERIC Right 06/07/2021  ? Procedure: INTRAMEDULLARY (IM) NAIL INTERTROCHANTRIC;  Surgeon: Thornton Park, MD;  Location: [facility] ORS;  Service: Orthopedics;  Laterality: Right;  ? TOTAL KNEE ARTHROPLASTY Right   ? ? ?SOCIAL HISTORY: ?Social History  ? ?Socioeconomic History  ? Marital status: Married  ?  Spouse name: Not on file  ? Number of children: Not on file  ? Years of education: Not on file  ? Highest education level: Not on file  ?Occupational History  ? Not on file  ?Tobacco Use  ? Smoking status: Never  ?  Passive exposure: Never  ? Smokeless tobacco: Never  ?Vaping Use  ? Vaping Use: Never used  ?Substance and Sexual Activity  ? Alcohol use: No  ? Drug use: Not Currently  ? Sexual activity: Not on file  ?Other Topics Concern  ? Not on file  ?Social History Narrative  ? Patient lives at this wife; daughter; disabled granddaughter.  Limited mobility.  No smoking or alcohol.   ? ?Social  Determinants of Health  ? ?Financial Resource Strain: Not on file  ?Food Insecurity: Not on file  ?Transportation Needs: Not on file  ?Physical Activity: Not on file  ?Stress: Not on file  ?Social Connections: Not on file  ?Intimate Partner Violence: Not on file  ? ? ?FAMILY HISTORY: ?Family History  ?Problem Relation Age of Onset  ? Colon cancer Mother   ? Esophageal cancer Brother   ? Breast cancer Daughter   ? ? ?ALLERGIES:  is allergic to codeine. ? ?MEDICATIONS:  ?Current Outpatient Medications  ?Medication Sig Dispense Refill  ? acetaminophen (TYLENOL) 500 MG tablet Take 500 mg by mouth every 6 (six) hours as needed.    ? amoxicillin (AMOXIL) 500 MG capsule Take 1 capsule (500 mg total) by mouth 3 (three) times daily. 21 capsule 0  ? aspirin EC 81 MG tablet Take 1 tablet (81 mg total) by mouth daily. Swallow whole. 30 tablet 0  ? cholecalciferol (VITAMIN D3) 25 MCG (1000 UNIT) tablet Take 1,000 Units by mouth daily.    ? ciprofloxacin (CILOXAN) 0.3 % ophthalmic solution PLACE 1 DROP INTO BOTH EYES EVERY 6 HOURS 5 mL 0  ? ferrous sulfate 325 (65 FE) MG tablet Take 325 mg by mouth daily with breakfast.    ? levofloxacin (QUIXIN) 0.5 % ophthalmic solution Place 1 drop into both eyes every 6 (six) hours. (Patient taking differently: Place 1 drop into both eyes daily as needed.) 5 mL 0  ? Nutritional Supplements (FEEDING SUPPLEMENT, NEPRO CARB STEADY,) LIQD Take 237 mLs by mouth 2 (two) times daily between meals.  0  ? omeprazole (PRILOSEC) 40 MG capsule Take 40 mg by mouth daily.    ? QUEtiapine (SEROQUEL) 50 MG tablet TAKE 1 TABLET BY MOUTH AT BEDTIME. 30 tablet 1  ? SANTYL 250 UNIT/GM ointment Apply topically daily. 15 g 5  ? sertraline (ZOLOFT) 25 MG tablet TAKE 1 TABLET BY MOUTH DAILY 30 tablet 0  ? ?No current facility-administered medications for this visit.  ? ?  ?. ? ?PHYSICAL EXAMINATION: ? ? ?Vitals:  ? 11/16/21 1319  ?BP: (!) 147/72  ?Pulse: 78  ?Temp: 99.2 ?F (37.3 ?C)  ?SpO2: 98%  ? ?Filed Weights  ?  11/16/21 1319  ?Weight: 139 lb 3.2 oz (63.1 kg)  ? ? ? ?Physical Exam ?Vitals and nursing note reviewed.  ?HENT:  ?   Head: Normocephalic and atraumatic.  ?   Mouth/Throat:  ?   Pharynx: Oropharynx is clear.  ?Eyes:  ?   Extraocular Movements: Extraocular movements intact.  ?   Pupils: Pupils are equal, round, and reactive to light.  ?Cardiovascular:  ?   Rate and Rhythm: Normal rate and regular rhythm.  ?Pulmonary:  ?   Comments: Decreased breath sounds bilaterally.  ?Abdominal:  ?   Palpations: Abdomen is soft.  ?Musculoskeletal:     ?   General: Normal range of motion.  ?   Cervical back: Normal range of motion.  ?Skin: ?   General: Skin is warm.  ?Neurological:  ?   General:  No focal deficit present.  ?   Mental Status: He is alert and oriented to person, place, and time.  ?Psychiatric:     ?   Behavior: Behavior normal.     ?   Judgment: Judgment normal.  ?  ? ?LABORATORY DATA:  ?I have reviewed the data as listed ?Lab Results  ?Component Value Date  ? WBC 13.9 (H) 11/16/2021  ? HGB 9.8 (L) 11/16/2021  ? HCT 32.0 (L) 11/16/2021  ? MCV 87.7 11/16/2021  ? PLT 392 11/16/2021  ? ?Recent Labs  ?  01/09/21 ?1020 02/27/21 ?1256 04/18/21 ?1302 06/06/21 ?2148 06/14/21 ?0241 08/18/21 ?1230 09/15/21 ?1508 11/16/21 ?1302  ?NA 139   < > 139   < > 139 141 137 136  ?K 4.8   < > 4.4   < > 3.7 4.4 4.1 4.4  ?CL 105   < > 102   < > 110 103 102 103  ?CO2 25   < > 28   < > '26 30 30 27  '$ ?GLUCOSE 102*   < > 85   < > 108* 104* 111* 120*  ?BUN 17   < > 21   < > 32* '21 22 22  '$ ?CREATININE 1.06   < > 1.06   < > 0.84 0.83 0.86 0.91  ?CALCIUM 9.3   < > 9.1   < > 8.1* 9.6 9.2 9.0  ?GFRNONAA  --    < >  --    < > >60  --  >60 >60  ?PROT 6.1   < > 5.9*   < > 5.2* 6.2 7.0 7.4  ?ALBUMIN 3.8   < > 3.7   < > 2.2* 3.4* 3.3* 3.3*  ?AST 13   < > 12   < > 22 11 13* 18  ?ALT 5   < > 5   < > '14 5 9 10  '$ ?ALKPHOS 103   < > 84   < > 84 119* 106 110  ?BILITOT 0.6   < > 0.4   < > 1.0 0.3 0.2* 0.8  ?BILIDIR 0.1  --  0.1  --   --  0.1  --   --   ? < > = values  in this interval not displayed.  ? ? ? ?No results found. ? ?ASSESSMENT & PLAN:  ? ?Colonic mass ?#Right colon mass-highly suspicious for malignancy given the imaging findings.  However patient not a surgical

## 2021-11-16 NOTE — Progress Notes (Signed)
Pt has had a cold/cough since last Tuesday. ? ?Would like to know if you could listen to his breathing and see if he may need antibiotic? ?

## 2021-11-17 LAB — CEA: CEA: 3.2 ng/mL (ref 0.0–4.7)

## 2021-11-17 NOTE — Telephone Encounter (Signed)
Spoke with patient's daughter Almyra Free & they had picked up script. They will let us know if any issues & will call for evaluation.  ?

## 2021-11-17 NOTE — Telephone Encounter (Signed)
Keep follow-up as scheduled in May, but does not need to get the MRI, the CT is sufficient, thanks ? ?Nickolas Madrid, MD ?11/17/2021 ? ?

## 2021-11-23 ENCOUNTER — Encounter: Payer: Self-pay | Admitting: Urology

## 2021-11-23 ENCOUNTER — Ambulatory Visit (INDEPENDENT_AMBULATORY_CARE_PROVIDER_SITE_OTHER): Payer: Medicare Other | Admitting: Urology

## 2021-11-23 VITALS — BP 105/67 | HR 70 | Ht 67.0 in | Wt 137.0 lb

## 2021-11-23 DIAGNOSIS — K6389 Other specified diseases of intestine: Secondary | ICD-10-CM | POA: Diagnosis not present

## 2021-11-23 DIAGNOSIS — N2889 Other specified disorders of kidney and ureter: Secondary | ICD-10-CM | POA: Diagnosis not present

## 2021-11-23 DIAGNOSIS — Z711 Person with feared health complaint in whom no diagnosis is made: Secondary | ICD-10-CM

## 2021-11-23 NOTE — Progress Notes (Signed)
? ?  11/23/2021 ?12:13 PM  ? ?Todd Trujillo. ?1930-01-03 ?[id number] ? ?Reason for visit: Follow up renal mass on active surveillance, new colon mass ? ?HPI: ?I saw Todd Trujillo and his daughter Todd Trujillo today for follow-up of 4.5 cm left renal mass on active surveillance.  She provides all of the history, and he appears extremely frail today, and significantly more frail than his last visit in August 2022. ? ?A CT abdomen and pelvis with contrast was performed on 10/03/2021 for ongoing weight loss and anemia.  I personally viewed and interpreted the images that show a essentially stable 4.5 cm complex cystic lesion from the upper pole of the left kidney consistent with cystic RCC, as well as a new large soft tissue mass in the ascending colon near the hepatic flexure worrisome for primary colon cancer.  The renal mass is essentially stable from 4.4 cm in May 2021. ? ?They met with Dr. Christian Mate with general surgery, and he was not felt to be a surgical candidate for resection.  They also discussed with a gastroenterologist at [facility] about possibility of biopsy or stenting, but this was not felt to be feasible secondary to the location, and his ongoing frailty.  I reviewed the outside notes.  They have also met with oncology and palliative care with Dr. Rogue Trujillo and Todd Chang, NP. ? ?I had a very long and frank conversation with the patient and his daughter today about his declining health and new likely colon cancer.  We discussed the risks of bleeding and obstruction from the colon cancer, and that this is much more of a risk than his stable left renal mass.  They are working on determining goals of care, and I encouraged them to continue to work with palliative care and potentially hospice about the next steps moving forward.  We talked about the challenging end-of-life decisions, and I encouraged them to discuss with general surgery or gastroenterology if there are any other options for decompression  or diversion if he ultimately develops bowel obstruction, as well as encouraged him to think about if they would like to pursue blood transfusions or a more palliative approach if he had significant drop in his hematocrit.  I think his daughter has a good understanding of his ongoing significant decline in his health, but it sounds like from the outside notes there has been some issues with his wife wanting more aggressive care.   ? ?No intervention or follow-up imaging required from a urology perspective regarding left renal mass in the setting of his significant decline and transition toward palliative/hospice ? ?Todd Co, MD ? ?[facility] ?[address] ?[place], [place] [address] ?[phone] ? ? ? ? ?

## 2021-11-24 ENCOUNTER — Encounter: Payer: Self-pay | Admitting: Internal Medicine

## 2021-11-30 ENCOUNTER — Telehealth: Payer: Self-pay | Admitting: Internal Medicine

## 2021-11-30 NOTE — Telephone Encounter (Signed)
Todd Trujillo from [facility] called stating they was late doing the pt evaluation and want to do it on Monday instead

## 2021-12-05 DIAGNOSIS — F324 Major depressive disorder, single episode, in partial remission: Secondary | ICD-10-CM

## 2021-12-05 DIAGNOSIS — K219 Gastro-esophageal reflux disease without esophagitis: Secondary | ICD-10-CM

## 2021-12-05 DIAGNOSIS — S72141D Displaced intertrochanteric fracture of right femur, subsequent encounter for closed fracture with routine healing: Secondary | ICD-10-CM | POA: Diagnosis not present

## 2021-12-05 DIAGNOSIS — Z7982 Long term (current) use of aspirin: Secondary | ICD-10-CM

## 2021-12-05 DIAGNOSIS — Z8673 Personal history of transient ischemic attack (TIA), and cerebral infarction without residual deficits: Secondary | ICD-10-CM

## 2021-12-05 DIAGNOSIS — W19XXXD Unspecified fall, subsequent encounter: Secondary | ICD-10-CM | POA: Diagnosis not present

## 2021-12-05 DIAGNOSIS — Z993 Dependence on wheelchair: Secondary | ICD-10-CM

## 2021-12-05 DIAGNOSIS — H3530 Unspecified macular degeneration: Secondary | ICD-10-CM

## 2021-12-05 DIAGNOSIS — R1310 Dysphagia, unspecified: Secondary | ICD-10-CM

## 2021-12-05 DIAGNOSIS — L89613 Pressure ulcer of right heel, stage 3: Secondary | ICD-10-CM | POA: Diagnosis not present

## 2021-12-05 DIAGNOSIS — E785 Hyperlipidemia, unspecified: Secondary | ICD-10-CM

## 2021-12-05 DIAGNOSIS — H9190 Unspecified hearing loss, unspecified ear: Secondary | ICD-10-CM

## 2021-12-05 DIAGNOSIS — Z79891 Long term (current) use of opiate analgesic: Secondary | ICD-10-CM

## 2021-12-05 DIAGNOSIS — R4681 Obsessive-compulsive behavior: Secondary | ICD-10-CM

## 2021-12-05 DIAGNOSIS — E611 Iron deficiency: Secondary | ICD-10-CM

## 2021-12-05 DIAGNOSIS — I10 Essential (primary) hypertension: Secondary | ICD-10-CM

## 2021-12-05 DIAGNOSIS — S065X0D Traumatic subdural hemorrhage without loss of consciousness, subsequent encounter: Secondary | ICD-10-CM | POA: Diagnosis not present

## 2021-12-05 DIAGNOSIS — E559 Vitamin D deficiency, unspecified: Secondary | ICD-10-CM

## 2021-12-05 DIAGNOSIS — M1990 Unspecified osteoarthritis, unspecified site: Secondary | ICD-10-CM

## 2021-12-05 DIAGNOSIS — Z9181 History of falling: Secondary | ICD-10-CM

## 2021-12-05 DIAGNOSIS — Z4800 Encounter for change or removal of nonsurgical wound dressing: Secondary | ICD-10-CM

## 2021-12-05 DIAGNOSIS — N400 Enlarged prostate without lower urinary tract symptoms: Secondary | ICD-10-CM

## 2021-12-05 DIAGNOSIS — Z8546 Personal history of malignant neoplasm of prostate: Secondary | ICD-10-CM

## 2021-12-06 ENCOUNTER — Other Ambulatory Visit: Payer: Self-pay | Admitting: Internal Medicine

## 2021-12-06 ENCOUNTER — Telehealth: Payer: Self-pay | Admitting: Internal Medicine

## 2021-12-06 NOTE — Telephone Encounter (Signed)
noted 

## 2021-12-06 NOTE — Telephone Encounter (Signed)
[place] from [facility] called stating he was late doing evaluation last week want to do it on Thursday or Friday of this week

## 2021-12-07 ENCOUNTER — Encounter: Payer: Self-pay | Admitting: Internal Medicine

## 2021-12-07 NOTE — Telephone Encounter (Signed)
In box for signature

## 2021-12-08 ENCOUNTER — Inpatient Hospital Stay (HOSPITAL_BASED_OUTPATIENT_CLINIC_OR_DEPARTMENT_OTHER): Payer: Medicare Other | Admitting: Internal Medicine

## 2021-12-08 ENCOUNTER — Inpatient Hospital Stay: Payer: Medicare Other

## 2021-12-08 ENCOUNTER — Encounter: Payer: Self-pay | Admitting: Internal Medicine

## 2021-12-08 VITALS — BP 109/89 | HR 62

## 2021-12-08 DIAGNOSIS — D649 Anemia, unspecified: Secondary | ICD-10-CM

## 2021-12-08 DIAGNOSIS — K6389 Other specified diseases of intestine: Secondary | ICD-10-CM

## 2021-12-08 DIAGNOSIS — R1903 Right lower quadrant abdominal swelling, mass and lump: Secondary | ICD-10-CM | POA: Diagnosis not present

## 2021-12-08 LAB — COMPREHENSIVE METABOLIC PANEL
ALT: 7 U/L (ref 0–44)
AST: 13 U/L — ABNORMAL LOW (ref 15–41)
Albumin: 3.3 g/dL — ABNORMAL LOW (ref 3.5–5.0)
Alkaline Phosphatase: 95 U/L (ref 38–126)
Anion gap: 7 (ref 5–15)
BUN: 16 mg/dL (ref 8–23)
CO2: 27 mmol/L (ref 22–32)
Calcium: 8.7 mg/dL — ABNORMAL LOW (ref 8.9–10.3)
Chloride: 103 mmol/L (ref 98–111)
Creatinine, Ser: 0.91 mg/dL (ref 0.61–1.24)
GFR, Estimated: >60 mL/min (ref >60)
Glucose, Bld: 117 mg/dL — ABNORMAL HIGH (ref 70–99)
Potassium: 3.9 mmol/L (ref 3.5–5.1)
Sodium: 137 mmol/L (ref 135–145)
Total Bilirubin: 0.7 mg/dL (ref 0.3–1.2)
Total Protein: 6.7 g/dL (ref 6.5–8.1)

## 2021-12-08 LAB — CBC WITH DIFFERENTIAL/PLATELET
Abs Immature Granulocytes: 0.05 10*3/uL (ref 0.00–0.07)
Basophils Absolute: 0.1 10*3/uL (ref 0.0–0.1)
Basophils Relative: 1 %
Eosinophils Absolute: 0.7 10*3/uL — ABNORMAL HIGH (ref 0.0–0.5)
Eosinophils Relative: 6 %
HCT: 33.5 % — ABNORMAL LOW (ref 39.0–52.0)
Hemoglobin: 10.2 g/dL — ABNORMAL LOW (ref 13.0–17.0)
Immature Granulocytes: 1 %
Lymphocytes Relative: 26 %
Lymphs Abs: 2.9 10*3/uL (ref 0.7–4.0)
MCH: 27 pg (ref 26.0–34.0)
MCHC: 30.4 g/dL (ref 30.0–36.0)
MCV: 88.6 fL (ref 80.0–100.0)
Monocytes Absolute: 0.8 10*3/uL (ref 0.1–1.0)
Monocytes Relative: 7 %
Neutro Abs: 6.5 10*3/uL (ref 1.7–7.7)
Neutrophils Relative %: 59 %
Platelets: 328 10*3/uL (ref 150–400)
RBC: 3.78 MIL/uL — ABNORMAL LOW (ref 4.22–5.81)
RDW: 17.5 % — ABNORMAL HIGH (ref 11.5–15.5)
WBC: 11 10*3/uL — ABNORMAL HIGH (ref 4.0–10.5)
nRBC: 0 % (ref 0.0–0.2)

## 2021-12-08 MED ORDER — IRON SUCROSE 20 MG/ML IV SOLN
200.0000 mg | Freq: Once | INTRAVENOUS | Status: AC
  Administered 2021-12-08: 200 mg via INTRAVENOUS
  Filled 2021-12-08: qty 10

## 2021-12-08 MED ORDER — SODIUM CHLORIDE 0.9 % IV SOLN
Freq: Once | INTRAVENOUS | Status: AC
  Filled 2021-12-08: qty 250

## 2021-12-08 MED ORDER — SODIUM CHLORIDE 0.9 % IV SOLN: 200.0000 mg | Freq: Once | INTRAVENOUS | Status: DC

## 2021-12-08 NOTE — Progress Notes (Signed)
Patient tolerated Venofer infusion well, no questions/concerns voiced. Stable. Patient  discharged with family member. AVS given.

## 2021-12-08 NOTE — Progress Notes (Signed)
[place] CONSULT NOTE  Patient Care Team: Einar Pheasant, MD as PCP - General (Internal Medicine)  CHIEF COMPLAINTS/PURPOSE OF CONSULTATION: ANEMIA   HEMATOLOGY HISTORY  # ANEMIA 2022 MARCH  Hb- nadir April 2022 [6 s/p PRBC transfusion; Hip fracture; s/p Sx- stroke]  WBC/platelets- WNL;  Iron sat-4.2% ferritin 68; creatinine normal [facility]GD[facility]  # MARCH 2023-CT scan abdomen pelvis-right-sided colon mass with history of malignancy; left-sided kidney mass 4.5 cm suggestive of malignancy  #November 2022 stroke [rehab]   Latest Reference Range & Units 08/18/21 12:30  Iron 42 - 165 ug/dL <10 (L)  TIBC 250.0 - 450.0 mcg/dL 238.0 (L)  Saturation Ratios 20.0 - 50.0 % 4.2 (L)  Ferritin 22.0 - 322.0 ng/mL 68.5  Transferrin 212.0 - 360.0 mg/dL 170.0 (L)  (L): Data is abnormally low  Hx of strokes  HISTORY OF PRESENTING ILLNESS: in a wheel chair; in gait belt; with daughter. HOH.  Elderly frail appearing Caucasian male patient.  Todd Trujillo. 88 y.o.  male pleasant patient right-sided colon mass suggestive of colon cancer; left kidney mass again suggestive of malignancy is here for follow-up.  In the interim evaluated by urology-recommended conservative management given his overall poor prognosis given unresectable colon cancer.  As per the daughter patient had a recent "head cold"; and hence this procedure/colonoscopy possible stenting acute was postponed.  Denies any worsening abdominal pain nausea vomiting.  Given the concerns for tolerance for bowel prep for the endoscopy at [place] is reluctant with any further consultation at [facility].  No fevers or Chills.  No worsening shortness of breath.   As per the daughter complains of significant weight loss.    Review of Systems  Constitutional:  Positive for malaise/fatigue and weight loss. Negative for chills, diaphoresis and fever.  HENT:  Negative for nosebleeds and sore throat.    Eyes:  Negative for double vision.  Respiratory:  Negative for cough, hemoptysis, sputum production, shortness of breath and wheezing.   Cardiovascular:  Negative for chest pain, palpitations, orthopnea and leg swelling.  Gastrointestinal:  Negative for abdominal pain, blood in stool, constipation, diarrhea, heartburn, melena, nausea and vomiting.  Genitourinary:  Negative for dysuria, frequency and urgency.  Musculoskeletal:  Positive for back pain and joint pain.  Skin: Negative.  Negative for itching and rash.  Neurological:  Negative for dizziness, tingling, focal weakness, weakness and headaches.  Endo/Heme/Allergies:  Does not bruise/bleed easily.  Psychiatric/Behavioral:  Negative for depression. The patient is not nervous/anxious and does not have insomnia.     MEDICAL HISTORY:  Past Medical History:  Diagnosis Date   Arthritis    Cancer [facility])    prostate   Hyperlipidemia    Hypertension    Left wrist fracture    Macular degeneration of right eye    Nephrolithiasis    Prostate CA ([place]) 2003   Prostate troubles    Patient was unsure of term   Squamous cell carcinoma of skin 01/23/2017   R distal lat bicep near elbow    SURGICAL HISTORY: Past Surgical History:  Procedure Laterality Date   INTRAMEDULLARY (IM) NAIL INTERTROCHANTERIC Right 06/07/2021   Procedure: INTRAMEDULLARY (IM) NAIL INTERTROCHANTRIC;  Surgeon: Thornton Park, MD;  Location: [facility] ORS;  Service: Orthopedics;  Laterality: Right;   TOTAL KNEE ARTHROPLASTY Right     SOCIAL HISTORY: Social History   Socioeconomic History   Marital status: Married    Spouse name: Not on file   Number of children: Not on file  Years of education: Not on file   Highest education level: Not on file  Occupational History   Not on file  Tobacco Use   Smoking status: Never    Passive exposure: Never   Smokeless tobacco: Never  Vaping Use   Vaping Use: Never used  Substance and Sexual Activity   Alcohol use: No    Drug use: Not Currently   Sexual activity: Not on file  Other Topics Concern   Not on file  Social History Narrative   Patient lives at this wife; daughter; disabled granddaughter.  Limited mobility.  No smoking or alcohol.    Social Determinants of Health   Financial Resource Strain: Not on file  Food Insecurity: Not on file  Transportation Needs: Not on file  Physical Activity: Not on file  Stress: Not on file  Social Connections: Not on file  Intimate Partner Violence: Not on file    FAMILY HISTORY: Family History  Problem Relation Age of Onset   Colon cancer Mother    Esophageal cancer Brother    Breast cancer Daughter     ALLERGIES:  is allergic to codeine.  MEDICATIONS:  Current Outpatient Medications  Medication Sig Dispense Refill   acetaminophen (TYLENOL) 500 MG tablet Take 500 mg by mouth every 6 (six) hours as needed.     cholecalciferol (VITAMIN D3) 25 MCG (1000 UNIT) tablet Take 1,000 Units by mouth daily.     Nutritional Supplements (FEEDING SUPPLEMENT, NEPRO CARB STEADY,) LIQD Take 237 mLs by mouth 2 (two) times daily between meals.  0   omeprazole (PRILOSEC) 40 MG capsule Take 40 mg by mouth daily.     QUEtiapine (SEROQUEL) 50 MG tablet TAKE 1 TABLET BY MOUTH AT BEDTIME. 30 tablet 1   SANTYL 250 UNIT/GM ointment Apply topically daily. 15 g 5   sertraline (ZOLOFT) 25 MG tablet TAKE 1 TABLET BY MOUTH DAILY 30 tablet 0   aspirin EC 81 MG tablet Take 1 tablet (81 mg total) by mouth daily. Swallow whole. 30 tablet 0   ciprofloxacin (CILOXAN) 0.3 % ophthalmic solution PLACE 1 DROP INTO BOTH EYES EVERY 6 HOURS (Patient not taking: Reported on 12/08/2021) 5 mL 0   ferrous sulfate 325 (65 FE) MG tablet Take 325 mg by mouth daily with breakfast. (Patient not taking: Reported on 12/08/2021)     levofloxacin (QUIXIN) 0.5 % ophthalmic solution Place 1 drop into both eyes every 6 (six) hours. (Patient not taking: Reported on 12/08/2021) 5 mL 0   No current  facility-administered medications for this visit.     Marland Kitchen  PHYSICAL EXAMINATION:   Vitals:   12/08/21 1300  BP: 107/62  Pulse: 66  Resp: 18  Temp: (!) 97.4 F (36.3 C)   Filed Weights   12/08/21 1300  Weight: 130 lb 4.8 oz (59.1 kg)     Physical Exam Vitals and nursing note reviewed.  HENT:     Head: Normocephalic and atraumatic.     Mouth/Throat:     Pharynx: Oropharynx is clear.  Eyes:     Extraocular Movements: Extraocular movements intact.     Pupils: Pupils are equal, round, and reactive to light.  Cardiovascular:     Rate and Rhythm: Normal rate and regular rhythm.  Pulmonary:     Comments: Decreased breath sounds bilaterally.  Abdominal:     Palpations: Abdomen is soft.  Musculoskeletal:        General: Normal range of motion.     Cervical back: Normal range of  motion.  Skin:    General: Skin is warm.  Neurological:     General: No focal deficit present.     Mental Status: He is alert and oriented to person, place, and time.  Psychiatric:        Behavior: Behavior normal.        Judgment: Judgment normal.     LABORATORY DATA:  I have reviewed the data as listed Lab Results  Component Value Date   WBC 11.0 (H) 12/08/2021   HGB 10.2 (L) 12/08/2021   HCT 33.5 (L) 12/08/2021   MCV 88.6 12/08/2021   PLT 328 12/08/2021   Recent Labs    01/09/21 1020 02/27/21 1256 04/18/21 1302 06/06/21 2148 08/18/21 1230 09/15/21 1508 11/16/21 1302 12/08/21 1253  NA 139   < > 139   < > 141 137 136 137  K 4.8   < > 4.4   < > 4.4 4.1 4.4 3.9  CL 105   < > 102   < > 103 102 103 103  CO2 25   < > 28   < > '30 30 27 27  '$ GLUCOSE 102*   < > 85   < > 104* 111* 120* 117*  BUN 17   < > 21   < > '21 22 22 16  '$ CREATININE 1.06   < > 1.06   < > 0.83 0.86 0.91 0.91  CALCIUM 9.3   < > 9.1   < > 9.6 9.2 9.0 8.7*  GFRNONAA  --    < >  --    < >  --  >60 >60 >60  PROT 6.1   < > 5.9*   < > 6.2 7.0 7.4 6.7  ALBUMIN 3.8   < > 3.7   < > 3.4* 3.3* 3.3* 3.3*  AST 13   < > 12   < >  11 13* 18 13*  ALT 5   < > 5   < > '5 9 10 7  '$ ALKPHOS 103   < > 84   < > 119* 106 110 95  BILITOT 0.6   < > 0.4   < > 0.3 0.2* 0.8 0.7  BILIDIR 0.1  --  0.1  --  0.1  --   --   --    < > = values in this interval not displayed.     No results found.  ASSESSMENT & PLAN:   Colonic mass #Right colon mass-highly suspicious for malignancy given the imaging findings.  However patient not a surgical candidate status post evaluation with surgery [Dr.Rodenberg].   Discussed with Dr. Spaete-colonic stents are technically challenging.Reluctant with evaluation with [facility] for endoscopic given concerns bowel prep.   # Cystic mass Left kidney approximately 4.5 cm in size: Again highly suspicious for malignancy based on imaging findings.s/p Evaluation with Urology, Dr.Siniski.   # Symptomatic anemia: Secondary to blood loss from above causes-hemoglobin today is 10.2  Proceed with Venofer today.   #Constipation: Intermittent abdominal pain.  Recommend MiraLAX as needed once a day.  # Overall prognosis: Discussed the natural history of the disease including bowel obstruction; and if and when that happens recommend hospice/comfort measures.  Patient s/p palliative care evaluation at home.  # DISPOSITION: # Venofer today #  in 2 weeks & in 4 weeks- venofer   # Follow up in 2 months-; MD labs- cbc/cmp;  possible venofer-Dr.B      All questions were answered. The patient knows to  call the clinic with any problems, questions or concerns.    Cammie Sickle, MD 12/08/2021 2:22 PM

## 2021-12-08 NOTE — Progress Notes (Signed)
Patient having a dry cough that does cause occasional difficulty catching breath.  Did not go for GI study at [facility] due to not feeling well.    Has stopped the Benefiber and bowel movements not as regular.    Decrease in appetite with 7 lb wt loss since last documented wt.

## 2021-12-08 NOTE — Assessment & Plan Note (Addendum)
#  Right colon mass-highly suspicious for malignancy given the imaging findings.  However patient not a surgical candidate status post evaluation with surgery [Dr.Rodenberg].   Discussed with Dr. Spaete-colonic stents are technically challenging.Reluctant with evaluation with [facility] for endoscopic given concerns bowel prep.   # Cystic mass Left kidney approximately 4.5 cm in size: Again highly suspicious for malignancy based on imaging findings.s/p Evaluation with Urology, Dr.Siniski.   # Symptomatic anemia: Secondary to blood loss from above causes-hemoglobin today is 10.2  Proceed with Venofer today.   #Constipation: Intermittent abdominal pain.  Recommend MiraLAX as needed once a day.  # Overall prognosis: Discussed the natural history of the disease including bowel obstruction; and if and when that happens recommend hospice/comfort measures.  Patient s/p palliative care evaluation at home.  # DISPOSITION: # Venofer today #  in 2 weeks & in 4 weeks- venofer   # Follow up in 2 months-; MD labs- cbc/cmp;  possible venofer-Dr.B

## 2021-12-08 NOTE — Patient Instructions (Signed)

## 2021-12-18 ENCOUNTER — Encounter: Payer: Self-pay | Admitting: Internal Medicine

## 2021-12-19 NOTE — Telephone Encounter (Signed)
Patient's wife called and stated they are still waiting for PT. No one has called or came to the house.

## 2021-12-19 NOTE — Telephone Encounter (Signed)
Home health orders were faxed  Will f/u

## 2021-12-22 ENCOUNTER — Inpatient Hospital Stay: Payer: Medicare Other | Attending: Internal Medicine

## 2021-12-22 VITALS — BP 110/56 | HR 66 | Temp 97.8°F | Resp 18

## 2021-12-22 DIAGNOSIS — Z79899 Other long term (current) drug therapy: Secondary | ICD-10-CM | POA: Insufficient documentation

## 2021-12-22 DIAGNOSIS — D509 Iron deficiency anemia, unspecified: Secondary | ICD-10-CM | POA: Diagnosis present

## 2021-12-22 DIAGNOSIS — D649 Anemia, unspecified: Secondary | ICD-10-CM

## 2021-12-22 MED ORDER — IRON SUCROSE 20 MG/ML IV SOLN
200.0000 mg | Freq: Once | INTRAVENOUS | Status: AC
  Administered 2021-12-22: 200 mg via INTRAVENOUS
  Filled 2021-12-22: qty 10

## 2021-12-22 MED ORDER — SODIUM CHLORIDE 0.9 % IV SOLN
Freq: Once | INTRAVENOUS | Status: AC
  Filled 2021-12-22: qty 250

## 2021-12-22 MED ORDER — SODIUM CHLORIDE 0.9 % IV SOLN: 200.0000 mg | Freq: Once | INTRAVENOUS | Status: DC

## 2021-12-22 NOTE — Patient Instructions (Signed)

## 2021-12-26 ENCOUNTER — Other Ambulatory Visit: Payer: Self-pay | Admitting: Internal Medicine

## 2021-12-27 NOTE — Telephone Encounter (Signed)
Rx ok'd for seroquel #30 with one refill 

## 2022-01-04 ENCOUNTER — Other Ambulatory Visit: Payer: Self-pay | Admitting: Pharmacist

## 2022-01-05 ENCOUNTER — Inpatient Hospital Stay: Payer: Medicare Other

## 2022-01-05 VITALS — BP 103/57 | HR 71 | Temp 97.2°F | Resp 16

## 2022-01-05 DIAGNOSIS — D649 Anemia, unspecified: Secondary | ICD-10-CM

## 2022-01-05 DIAGNOSIS — D509 Iron deficiency anemia, unspecified: Secondary | ICD-10-CM | POA: Diagnosis not present

## 2022-01-05 MED ORDER — SODIUM CHLORIDE 0.9 % IV SOLN: 200.0000 mg | Freq: Once | INTRAVENOUS | Status: DC

## 2022-01-05 MED ORDER — IRON SUCROSE 20 MG/ML IV SOLN
200.0000 mg | Freq: Once | INTRAVENOUS | Status: AC
  Administered 2022-01-05: 200 mg via INTRAVENOUS
  Filled 2022-01-05: qty 10

## 2022-01-05 MED ORDER — SODIUM CHLORIDE 0.9 % IV SOLN
Freq: Once | INTRAVENOUS | Status: AC
  Filled 2022-01-05: qty 250

## 2022-01-08 ENCOUNTER — Other Ambulatory Visit: Payer: Self-pay | Admitting: Internal Medicine

## 2022-01-23 ENCOUNTER — Other Ambulatory Visit: Payer: Self-pay | Admitting: Internal Medicine

## 2022-02-07 ENCOUNTER — Inpatient Hospital Stay: Payer: Medicare Other | Attending: Internal Medicine

## 2022-02-07 ENCOUNTER — Inpatient Hospital Stay (HOSPITAL_BASED_OUTPATIENT_CLINIC_OR_DEPARTMENT_OTHER): Payer: Medicare Other | Admitting: Medical Oncology

## 2022-02-07 ENCOUNTER — Other Ambulatory Visit: Payer: Self-pay | Admitting: Internal Medicine

## 2022-02-07 ENCOUNTER — Inpatient Hospital Stay: Payer: Medicare Other

## 2022-02-07 VITALS — BP 125/58 | HR 70

## 2022-02-07 VITALS — BP 108/51 | HR 70 | Temp 97.5°F | Resp 16 | Wt 145.0 lb

## 2022-02-07 DIAGNOSIS — D509 Iron deficiency anemia, unspecified: Secondary | ICD-10-CM

## 2022-02-07 DIAGNOSIS — K6389 Other specified diseases of intestine: Secondary | ICD-10-CM | POA: Diagnosis not present

## 2022-02-07 DIAGNOSIS — Z85828 Personal history of other malignant neoplasm of skin: Secondary | ICD-10-CM | POA: Insufficient documentation

## 2022-02-07 DIAGNOSIS — R634 Abnormal weight loss: Secondary | ICD-10-CM

## 2022-02-07 DIAGNOSIS — I10 Essential (primary) hypertension: Secondary | ICD-10-CM | POA: Insufficient documentation

## 2022-02-07 DIAGNOSIS — E785 Hyperlipidemia, unspecified: Secondary | ICD-10-CM | POA: Diagnosis not present

## 2022-02-07 DIAGNOSIS — D649 Anemia, unspecified: Secondary | ICD-10-CM | POA: Diagnosis not present

## 2022-02-07 DIAGNOSIS — Z8673 Personal history of transient ischemic attack (TIA), and cerebral infarction without residual deficits: Secondary | ICD-10-CM | POA: Diagnosis not present

## 2022-02-07 DIAGNOSIS — N2889 Other specified disorders of kidney and ureter: Secondary | ICD-10-CM | POA: Diagnosis not present

## 2022-02-07 DIAGNOSIS — Z7982 Long term (current) use of aspirin: Secondary | ICD-10-CM | POA: Insufficient documentation

## 2022-02-07 DIAGNOSIS — Z79899 Other long term (current) drug therapy: Secondary | ICD-10-CM | POA: Insufficient documentation

## 2022-02-07 DIAGNOSIS — M129 Arthropathy, unspecified: Secondary | ICD-10-CM | POA: Diagnosis not present

## 2022-02-07 LAB — COMPREHENSIVE METABOLIC PANEL
ALT: 13 U/L (ref 0–44)
AST: 17 U/L (ref 15–41)
Albumin: 3.2 g/dL — ABNORMAL LOW (ref 3.5–5.0)
Alkaline Phosphatase: 99 U/L (ref 38–126)
Anion gap: 5 (ref 5–15)
BUN: 17 mg/dL (ref 8–23)
CO2: 29 mmol/L (ref 22–32)
Calcium: 8.9 mg/dL (ref 8.9–10.3)
Chloride: 104 mmol/L (ref 98–111)
Creatinine, Ser: 0.91 mg/dL (ref 0.61–1.24)
GFR, Estimated: >60 mL/min (ref >60)
Glucose, Bld: 103 mg/dL — ABNORMAL HIGH (ref 70–99)
Potassium: 4.1 mmol/L (ref 3.5–5.1)
Sodium: 138 mmol/L (ref 135–145)
Total Bilirubin: 0.3 mg/dL (ref 0.3–1.2)
Total Protein: 6.4 g/dL — ABNORMAL LOW (ref 6.5–8.1)

## 2022-02-07 LAB — CBC WITH DIFFERENTIAL/PLATELET
Abs Immature Granulocytes: 0.04 10*3/uL (ref 0.00–0.07)
Basophils Absolute: 0.1 10*3/uL (ref 0.0–0.1)
Basophils Relative: 1 %
Eosinophils Absolute: 0.7 10*3/uL — ABNORMAL HIGH (ref 0.0–0.5)
Eosinophils Relative: 6 %
HCT: 32.8 % — ABNORMAL LOW (ref 39.0–52.0)
Hemoglobin: 10.1 g/dL — ABNORMAL LOW (ref 13.0–17.0)
Immature Granulocytes: 0 %
Lymphocytes Relative: 28 %
Lymphs Abs: 2.9 10*3/uL (ref 0.7–4.0)
MCH: 28.9 pg (ref 26.0–34.0)
MCHC: 30.8 g/dL (ref 30.0–36.0)
MCV: 94 fL (ref 80.0–100.0)
Monocytes Absolute: 0.8 10*3/uL (ref 0.1–1.0)
Monocytes Relative: 8 %
Neutro Abs: 6.1 10*3/uL (ref 1.7–7.7)
Neutrophils Relative %: 57 %
Platelets: 274 10*3/uL (ref 150–400)
RBC: 3.49 MIL/uL — ABNORMAL LOW (ref 4.22–5.81)
RDW: 17.1 % — ABNORMAL HIGH (ref 11.5–15.5)
WBC: 10.6 10*3/uL — ABNORMAL HIGH (ref 4.0–10.5)
nRBC: 0 % (ref 0.0–0.2)

## 2022-02-07 MED ORDER — SODIUM CHLORIDE 0.9 % IV SOLN
Freq: Once | INTRAVENOUS | Status: AC
  Filled 2022-02-07: qty 250

## 2022-02-07 MED ORDER — SODIUM CHLORIDE 0.9 % IV SOLN
200.0000 mg | Freq: Once | INTRAVENOUS | Status: AC
  Administered 2022-02-07: 200 mg via INTRAVENOUS
  Filled 2022-02-07: qty 200

## 2022-02-07 NOTE — Progress Notes (Signed)
Patient tolerated Venofer infusion well, no questions/concerns voiced. Patient stable at discharge. AVS given.   ?

## 2022-02-07 NOTE — Progress Notes (Signed)
Returns for follow-up of anemia. Pt reports that things are "tolerable". He and daughter deny any new concerns.

## 2022-02-07 NOTE — Progress Notes (Signed)
[facility] CONSULT NOTE  Patient Care Team: Einar Pheasant, MD as PCP - General (Internal Medicine)  CHIEF COMPLAINTS/PURPOSE OF CONSULTATION: ANEMIA   HEMATOLOGY HISTORY  # ANEMIA 2022 MARCH  Hb- nadir April 2022 [6 s/p PRBC transfusion; Hip fracture; s/p Sx- stroke]  WBC/platelets- WNL;  Iron sat-4.2% ferritin 68; creatinine normal EGD/colonoscopy->> 5 years [[facility]]  # MARCH 2023-CT scan abdomen pelvis-right-sided colon mass with history of malignancy; left-sided kidney mass 4.5 cm suggestive of malignancy  #November 2022 stroke [rehab]   Latest Reference Range & Units 08/18/21 12:30  Iron 42 - 165 ug/dL <10 (L)  TIBC 250.0 - 450.0 mcg/dL 238.0 (L)  Saturation Ratios 20.0 - 50.0 % 4.2 (L)  Ferritin 22.0 - 322.0 ng/mL 68.5  Transferrin 212.0 - 360.0 mg/dL 170.0 (L)  (L): Data is abnormally low  Hx of strokes  HISTORY OF PRESENTING ILLNESS: in a wheel chair; in gait belt; with daughter. HOH.  Elderly frail appearing Caucasian male patient.  Todd Trujillo. 88 y.o.  male pleasant patient right-sided colon mass suggestive of colon cancer; left kidney mass again suggestive of malignancy is here for follow-up.  At this time they continue to want to avoid further work up given risks.   They report that he is doing well all things considered. Appetite is fair. Drinking well. Tolerating the Venofer well. Last treatment was 01/05/2022). No known blood loss. Bowels are regular. No night sweats, fever.       Review of Systems  Constitutional:  Positive for malaise/fatigue. Negative for chills, diaphoresis, fever and weight loss.  HENT:  Negative for nosebleeds and sore throat.   Eyes:  Negative for double vision.  Respiratory:  Negative for cough, hemoptysis, sputum production, shortness of breath and wheezing.   Cardiovascular:  Negative for chest pain, palpitations, orthopnea and leg swelling.  Gastrointestinal:  Negative for abdominal pain, blood in  stool, constipation, diarrhea, heartburn, melena, nausea and vomiting.  Genitourinary:  Negative for dysuria, frequency and urgency.  Musculoskeletal:  Positive for back pain and joint pain.  Skin: Negative.  Negative for itching and rash.  Neurological:  Negative for dizziness, tingling, focal weakness, weakness and headaches.  Endo/Heme/Allergies:  Does not bruise/bleed easily.  Psychiatric/Behavioral:  Negative for depression. The patient is not nervous/anxious and does not have insomnia.      MEDICAL HISTORY:  Past Medical History:  Diagnosis Date   Arthritis    Cancer [facility])    prostate   Hyperlipidemia    Hypertension    Left wrist fracture    Macular degeneration of right eye    Nephrolithiasis    Prostate CA ([place]) 2003   Prostate troubles    Patient was unsure of term   Squamous cell carcinoma of skin 01/23/2017   R distal lat bicep near elbow    SURGICAL HISTORY: Past Surgical History:  Procedure Laterality Date   INTRAMEDULLARY (IM) NAIL INTERTROCHANTERIC Right 06/07/2021   Procedure: INTRAMEDULLARY (IM) NAIL INTERTROCHANTRIC;  Surgeon: Thornton Park, MD;  Location: [facility] ORS;  Service: Orthopedics;  Laterality: Right;   TOTAL KNEE ARTHROPLASTY Right     SOCIAL HISTORY: Social History   Socioeconomic History   Marital status: Married    Spouse name: Not on file   Number of children: Not on file   Years of education: Not on file   Highest education level: Not on file  Occupational History   Not on file  Tobacco Use   Smoking status: Never    Passive  exposure: Never   Smokeless tobacco: Never  Vaping Use   Vaping Use: Never used  Substance and Sexual Activity   Alcohol use: No   Drug use: Not Currently   Sexual activity: Not on file  Other Topics Concern   Not on file  Social History Narrative   Patient lives at this wife; daughter; disabled granddaughter.  Limited mobility.  No smoking or alcohol.    Social Determinants of Health   Financial  Resource Strain: Not on file  Food Insecurity: Not on file  Transportation Needs: Not on file  Physical Activity: Not on file  Stress: Not on file  Social Connections: Not on file  Intimate Partner Violence: Not on file    FAMILY HISTORY: Family History  Problem Relation Age of Onset   Colon cancer Mother    Esophageal cancer Brother    Breast cancer Daughter     ALLERGIES:  is allergic to codeine.  MEDICATIONS:  Current Outpatient Medications  Medication Sig Dispense Refill   acetaminophen (TYLENOL) 500 MG tablet Take 500 mg by mouth every 6 (six) hours as needed.     aspirin EC 81 MG tablet Take 1 tablet (81 mg total) by mouth daily. Swallow whole. 30 tablet 0   cholecalciferol (VITAMIN D3) 25 MCG (1000 UNIT) tablet Take 1,000 Units by mouth daily.     ciprofloxacin (CILOXAN) 0.3 % ophthalmic solution PLACE 1 DROP INTO BOTH EYES EVERY 6 HOURS (Patient not taking: Reported on 12/08/2021) 5 mL 0   ferrous sulfate 325 (65 FE) MG tablet Take 325 mg by mouth daily with breakfast. (Patient not taking: Reported on 12/08/2021)     levofloxacin (QUIXIN) 0.5 % ophthalmic solution Place 1 drop into both eyes every 6 (six) hours. (Patient not taking: Reported on 12/08/2021) 5 mL 0   Nutritional Supplements (FEEDING SUPPLEMENT, NEPRO CARB STEADY,) LIQD Take 237 mLs by mouth 2 (two) times daily between meals.  0   omeprazole (PRILOSEC) 40 MG capsule Take 40 mg by mouth daily.     QUEtiapine (SEROQUEL) 50 MG tablet TAKE 1 TABLET BY MOUTH AT BEDTIME. 30 tablet 1   SANTYL 250 UNIT/GM ointment Apply topically daily. 15 g 5   sertraline (ZOLOFT) 25 MG tablet TAKE 1 TABLET BY MOUTH DAILY 30 tablet 0   No current facility-administered medications for this visit.     Marland Kitchen  PHYSICAL EXAMINATION:   Vitals:   02/07/22 1340  BP: (!) 108/51  Pulse: 70  Resp: 16  Temp: (!) 97.5 F (36.4 C)  SpO2: 100%   Filed Weights   02/07/22 1340  Weight: 145 lb (65.8 kg)     Physical Exam Vitals and  nursing note reviewed.  HENT:     Head: Normocephalic and atraumatic.     Mouth/Throat:     Pharynx: Oropharynx is clear.  Eyes:     Extraocular Movements: Extraocular movements intact.     Pupils: Pupils are equal, round, and reactive to light.  Cardiovascular:     Rate and Rhythm: Normal rate and regular rhythm.  Pulmonary:     Comments: Decreased breath sounds bilaterally.  Abdominal:     Palpations: Abdomen is soft.  Musculoskeletal:        General: Normal range of motion.     Cervical back: Normal range of motion.  Skin:    General: Skin is warm.  Neurological:     General: No focal deficit present.     Mental Status: He is alert and oriented  to person, place, and time.  Psychiatric:        Judgment: Judgment normal.     Comments: Flat affect      LABORATORY DATA:  I have reviewed the data as listed Lab Results  Component Value Date   WBC 10.6 (H) 02/07/2022   HGB 10.1 (L) 02/07/2022   HCT 32.8 (L) 02/07/2022   MCV 94.0 02/07/2022   PLT 274 02/07/2022   Recent Labs    04/18/21 1302 06/06/21 2148 08/18/21 1230 09/15/21 1508 11/16/21 1302 12/08/21 1253 02/07/22 1304  NA 139   < > 141   < > 136 137 138  K 4.4   < > 4.4   < > 4.4 3.9 4.1  CL 102   < > 103   < > 103 103 104  CO2 28   < > 30   < > '27 27 29  '$ GLUCOSE 85   < > 104*   < > 120* 117* 103*  BUN 21   < > 21   < > '22 16 17  '$ CREATININE 1.06   < > 0.83   < > 0.91 0.91 0.91  CALCIUM 9.1   < > 9.6   < > 9.0 8.7* 8.9  GFRNONAA  --    < >  --    < > >60 >60 >60  PROT 5.9*   < > 6.2   < > 7.4 6.7 6.4*  ALBUMIN 3.7   < > 3.4*   < > 3.3* 3.3* 3.2*  AST 12   < > 11   < > 18 13* 17  ALT 5   < > 5   < > '10 7 13  '$ ALKPHOS 84   < > 119*   < > 110 95 99  BILITOT 0.4   < > 0.3   < > 0.8 0.7 0.3  BILIDIR 0.1  --  0.1  --   --   --   --    < > = values in this interval not displayed.     No results found.  ASSESSMENT & PLAN:   Encounter Diagnoses  Name Primary?   Symptomatic anemia Yes   Colonic mass     Unintentional weight loss    Renal mass    Iron deficiency anemia, unspecified iron deficiency anemia type    Chronic in nature and though to be secondary to his renal and colonic masses. Labs suggestive that restarting Venofer will prove beneficial. Venofer today, then weekly for a total of 3 doses. Following up in 2 months.  Chronic in nature- see above. Again they wish to avoid further work up at this time due to risks. Quality of life important. Reviewed red flag signs and symptoms.  Chronic but improved with better oral intake. Per daughter eating more fast food and sweets.  Chronic in nature. See above. Continue monitoring Chronic in nature. See above  PLAN:  Venofer today Venofer once weekly x 3 weeks total. Labs next time (ferritin/iron) as this wasn't ordered for today and patient has already been drawn and prefers not to be redrawn understandably- has history of IDK with suspected colonic malignancy.  RTC 2 months with MD, labs, +- Venofer     All questions were answered. The patient knows to call the clinic with any problems, questions or concerns.    Hughie Closs, PA-C 02/07/2022 2:30 PM

## 2022-02-07 NOTE — Patient Instructions (Signed)

## 2022-02-13 ENCOUNTER — Other Ambulatory Visit: Payer: Self-pay

## 2022-02-13 DIAGNOSIS — D649 Anemia, unspecified: Secondary | ICD-10-CM

## 2022-02-13 MED FILL — Iron Sucrose Inj 20 MG/ML (Fe Equiv): INTRAVENOUS | Qty: 10 | Status: AC

## 2022-02-14 ENCOUNTER — Inpatient Hospital Stay: Payer: Medicare Other

## 2022-02-14 ENCOUNTER — Inpatient Hospital Stay: Payer: Medicare Other | Attending: Internal Medicine

## 2022-02-14 VITALS — BP 120/67 | HR 68 | Temp 98.0°F | Resp 18

## 2022-02-14 DIAGNOSIS — Z79899 Other long term (current) drug therapy: Secondary | ICD-10-CM | POA: Diagnosis not present

## 2022-02-14 DIAGNOSIS — D509 Iron deficiency anemia, unspecified: Secondary | ICD-10-CM | POA: Insufficient documentation

## 2022-02-14 DIAGNOSIS — D649 Anemia, unspecified: Secondary | ICD-10-CM

## 2022-02-14 LAB — IRON AND TIBC
Iron: 32 ug/dL — ABNORMAL LOW (ref 45–182)
Saturation Ratios: 18 % (ref 17.9–39.5)
TIBC: 179 ug/dL — ABNORMAL LOW (ref 250–450)
UIBC: 147 ug/dL

## 2022-02-14 LAB — FERRITIN: Ferritin: 281 ng/mL (ref 24–336)

## 2022-02-14 MED ORDER — SODIUM CHLORIDE 0.9 % IV SOLN
Freq: Once | INTRAVENOUS | Status: AC
  Filled 2022-02-14: qty 250

## 2022-02-14 MED ORDER — SODIUM CHLORIDE 0.9 % IV SOLN
200.0000 mg | Freq: Once | INTRAVENOUS | Status: AC
  Administered 2022-02-14: 200 mg via INTRAVENOUS
  Filled 2022-02-14: qty 200

## 2022-02-16 ENCOUNTER — Ambulatory Visit (INDEPENDENT_AMBULATORY_CARE_PROVIDER_SITE_OTHER): Payer: Medicare Other | Admitting: Internal Medicine

## 2022-02-16 ENCOUNTER — Encounter: Payer: Self-pay | Admitting: Internal Medicine

## 2022-02-16 VITALS — BP 114/60 | HR 62 | Temp 97.6°F | Resp 17 | Ht 67.0 in | Wt 147.4 lb

## 2022-02-16 DIAGNOSIS — L989 Disorder of the skin and subcutaneous tissue, unspecified: Secondary | ICD-10-CM

## 2022-02-16 DIAGNOSIS — N2889 Other specified disorders of kidney and ureter: Secondary | ICD-10-CM

## 2022-02-16 DIAGNOSIS — I739 Peripheral vascular disease, unspecified: Secondary | ICD-10-CM | POA: Diagnosis not present

## 2022-02-16 DIAGNOSIS — E7800 Pure hypercholesterolemia, unspecified: Secondary | ICD-10-CM | POA: Diagnosis not present

## 2022-02-16 DIAGNOSIS — D509 Iron deficiency anemia, unspecified: Secondary | ICD-10-CM

## 2022-02-16 DIAGNOSIS — C182 Malignant neoplasm of ascending colon: Secondary | ICD-10-CM | POA: Diagnosis not present

## 2022-02-16 DIAGNOSIS — G319 Degenerative disease of nervous system, unspecified: Secondary | ICD-10-CM

## 2022-02-16 DIAGNOSIS — Z8546 Personal history of malignant neoplasm of prostate: Secondary | ICD-10-CM

## 2022-02-16 DIAGNOSIS — F0390 Unspecified dementia without behavioral disturbance: Secondary | ICD-10-CM

## 2022-02-16 DIAGNOSIS — I10 Essential (primary) hypertension: Secondary | ICD-10-CM | POA: Diagnosis not present

## 2022-02-16 DIAGNOSIS — Z8673 Personal history of transient ischemic attack (TIA), and cerebral infarction without residual deficits: Secondary | ICD-10-CM

## 2022-02-16 DIAGNOSIS — L602 Onychogryphosis: Secondary | ICD-10-CM

## 2022-02-16 NOTE — Progress Notes (Signed)
Patient ID: Todd Trujillo., male   DOB: 11-19-1929, 88 y.o.   MRN: [id number]   Subjective:    Patient ID: Todd Trujillo., male    DOB: [birth year], 88 y.o.   MRN: [id number]   Patient here for a scheduled follow up .   HPI Here to follow up regarding his blood pressure and cholesterol.  He is accompanied by his daughter Winona Legato).  History obtained from both of them.  He does get up and walk some with assistance.  Wears safety belt.  No chest pain.  Breathing stable.  Eating.  Weight has come up some.  No nausea or vomiting.  Bowels moving.  No abdominal pain.  Has skin lesion - left forearm.  Has appt with dermatology in 03/2022.  Thickened toe nails.  Request podiatry referral.  Ulcerations - healed on heels.  Discussed avoiding direct pressure.     Past Medical History:  Diagnosis Date   Arthritis    Cancer [facility])    prostate   Hyperlipidemia    Hypertension    Left wrist fracture    Macular degeneration of right eye    Nephrolithiasis    Prostate CA ([place]) 2003   Prostate troubles    Patient was unsure of term   Squamous cell carcinoma of skin 01/23/2017   R distal lat bicep near elbow   Past Surgical History:  Procedure Laterality Date   INTRAMEDULLARY (IM) NAIL INTERTROCHANTERIC Right 06/07/2021   Procedure: INTRAMEDULLARY (IM) NAIL INTERTROCHANTRIC;  Surgeon: Thornton Park, MD;  Location: [facility] ORS;  Service: Orthopedics;  Laterality: Right;   TOTAL KNEE ARTHROPLASTY Right    Family History  Problem Relation Age of Onset   Colon cancer Mother    Esophageal cancer Brother    Breast cancer Daughter    Social History   Socioeconomic History   Marital status: Married    Spouse name: Not on file   Number of children: Not on file   Years of education: Not on file   Highest education level: Not on file  Occupational History   Not on file  Tobacco Use   Smoking status: Never    Passive exposure: Never   Smokeless tobacco: Never   Vaping Use   Vaping Use: Never used  Substance and Sexual Activity   Alcohol use: No   Drug use: Not Currently   Sexual activity: Not on file  Other Topics Concern   Not on file  Social History Narrative   Patient lives at this wife; daughter; disabled granddaughter.  Limited mobility.  No smoking or alcohol.    Social Determinants of Health   Financial Resource Strain: Not on file  Food Insecurity: Not on file  Transportation Needs: Not on file  Physical Activity: Not on file  Stress: Not on file  Social Connections: Not on file     Review of Systems  Constitutional:  Negative for appetite change and unexpected weight change.  HENT:  Negative for congestion and sinus pressure.   Respiratory:  Negative for cough, chest tightness and shortness of breath.   Cardiovascular:  Negative for chest pain and palpitations.       No increased swelling.   Gastrointestinal:  Negative for abdominal pain, diarrhea, nausea and vomiting.  Genitourinary:  Negative for difficulty urinating and dysuria.  Musculoskeletal:  Negative for joint swelling and myalgias.  Skin:  Negative for color change and rash.       Skin lesion - left forearm.  Neurological:  Negative for dizziness, light-headedness and headaches.  Psychiatric/Behavioral:  Negative for agitation and dysphoric mood.        Objective:     BP 114/60 (BP Location: Left Arm, Patient Position: Sitting, Cuff Size: Small)   Pulse 62   Temp 97.6 F (36.4 C) (Temporal)   Resp 17   Ht '5\' 7"'$  (1.702 m)   Wt 147 lb 6.4 oz (66.9 kg)   SpO2 100%   BMI 23.09 kg/m  Wt Readings from Last 3 Encounters:  02/16/22 147 lb 6.4 oz (66.9 kg)  02/07/22 145 lb (65.8 kg)  12/08/21 130 lb 4.8 oz (59.1 kg)    Physical Exam Constitutional:      General: He is not in acute distress.    Appearance: Normal appearance. He is well-developed.  HENT:     Head: Normocephalic and atraumatic.     Right Ear: External ear normal.     Left Ear:  External ear normal.  Eyes:     General: No scleral icterus.       Right eye: No discharge.        Left eye: No discharge.  Cardiovascular:     Rate and Rhythm: Normal rate and regular rhythm.  Pulmonary:     Effort: Pulmonary effort is normal. No respiratory distress.     Breath sounds: Normal breath sounds.  Abdominal:     General: Bowel sounds are normal.     Palpations: Abdomen is soft.     Tenderness: There is no abdominal tenderness.  Musculoskeletal:        General: No swelling or tenderness.     Cervical back: Neck supple. No tenderness.  Lymphadenopathy:     Cervical: No cervical adenopathy.  Skin:    Findings: No erythema or rash.     Comments: Skin lesion - left forearm.   Neurological:     Mental Status: He is alert.  Psychiatric:        Mood and Affect: Mood normal.        Behavior: Behavior normal.      Outpatient Encounter Medications as of 02/16/2022  Medication Sig   acetaminophen (TYLENOL) 500 MG tablet Take 500 mg by mouth every 6 (six) hours as needed.   aspirin EC 81 MG tablet Take 1 tablet (81 mg total) by mouth daily. Swallow whole.   cholecalciferol (VITAMIN D3) 25 MCG (1000 UNIT) tablet Take 1,000 Units by mouth daily.   Nutritional Supplements (FEEDING SUPPLEMENT, NEPRO CARB STEADY,) LIQD Take 237 mLs by mouth 2 (two) times daily between meals.   omeprazole (PRILOSEC) 40 MG capsule Take 40 mg by mouth daily.   QUEtiapine (SEROQUEL) 50 MG tablet TAKE 1 TABLET BY MOUTH AT BEDTIME.   sertraline (ZOLOFT) 25 MG tablet TAKE 1 TABLET BY MOUTH DAILY   [DISCONTINUED] ciprofloxacin (CILOXAN) 0.3 % ophthalmic solution PLACE 1 DROP INTO BOTH EYES EVERY 6 HOURS (Patient not taking: Reported on 12/08/2021)   [DISCONTINUED] ferrous sulfate 325 (65 FE) MG tablet Take 325 mg by mouth daily with breakfast. (Patient not taking: Reported on 12/08/2021)   [DISCONTINUED] levofloxacin (QUIXIN) 0.5 % ophthalmic solution Place 1 drop into both eyes every 6 (six) hours. (Patient  not taking: Reported on 12/08/2021)   [DISCONTINUED] SANTYL 250 UNIT/GM ointment Apply topically daily.   No facility-administered encounter medications on file as of 02/16/2022.     Lab Results  Component Value Date   WBC 10.6 (H) 02/07/2022   HGB 10.1 (L) 02/07/2022  HCT 32.8 (L) 02/07/2022   PLT 274 02/07/2022   GLUCOSE 103 (H) 02/07/2022   CHOL 183 08/18/2021   TRIG 134.0 08/18/2021   HDL 40.20 08/18/2021   LDLCALC 116 (H) 08/18/2021   ALT 13 02/07/2022   AST 17 02/07/2022   NA 138 02/07/2022   K 4.1 02/07/2022   CL 104 02/07/2022   CREATININE 0.91 02/07/2022   BUN 17 02/07/2022   CO2 29 02/07/2022   TSH 2.03 08/18/2021   INR 1.5 (H) 06/06/2021   HGBA1C 5.5 09/13/2019    CT ABDOMEN PELVIS W CONTRAST  Result Date: 10/04/2021 CLINICAL DATA:  Unintentional weight loss. Anemia. Prior prostate carcinoma. * Tracking Code: BO * EXAM: CT ABDOMEN AND PELVIS WITH CONTRAST TECHNIQUE: Multidetector CT imaging of the abdomen and pelvis was performed using the standard protocol following bolus administration of intravenous contrast. RADIATION DOSE REDUCTION: This exam was performed according to the departmental dose-optimization program which includes automated exposure control, adjustment of the mA and/or kV according to patient size and/or use of iterative reconstruction technique. CONTRAST:  67m OMNIPAQUE IOHEXOL 300 MG/ML  SOLN COMPARISON:  None. FINDINGS: Lower Chest: No acute findings. Hepatobiliary: No hepatic masses identified. Tiny subcapsular cyst seen in the dome of the left lobe. Gallbladder is unremarkable. No evidence of biliary ductal dilatation. Pancreas:  No mass or inflammatory changes. Spleen: Within normal limits in size and appearance. Adrenals/Urinary Tract: A subcapsular complex cystic lesion is seen arising from the lateral upper pole the left kidney. This measures 4.5 x 4.0 cm and shows internal soft tissue nodularity. This is highly suspicious for a cystic renal cell  carcinoma. No other renal masses are identified. No evidence of ureteral calculi or hydronephrosis. Stomach/Bowel: Annular soft tissue mass is seen involving the ascending colon just proximal to the hepatic flexure, consistent with a primary colon carcinoma. No evidence of bowel obstruction. No evidence of inflammatory process or abnormal fluid collections. Vascular/Lymphatic: No pathologically enlarged lymph nodes. No acute vascular findings. Aortic atherosclerotic calcification noted. Reproductive: Brachytherapy seeds seen throughout the prostate gland. Median lobe enlargement is again seen indenting the bladder base. Other:  None. Musculoskeletal: No suspicious bone lesions identified. Internal fixation hardware noted in right hip. IMPRESSION: Annular soft tissue mass involving the ascending colon just proximal to the hepatic flexure, consistent with primary colon carcinoma. No evidence of metastatic disease. 4.5 cm complex cystic lesion arising from the upper pole of the left kidney, highly suspicious for cystic renal cell carcinoma. Abdomen MRI without and with contrast is recommended for further characterization. Aortic Atherosclerosis (ICD10-I70.0). These results will be called to the ordering clinician or representative by the Radiologist Assistant, and communication documented in the PACS or [employer] Electronically Signed   By: JMarlaine HindM.D.   On: 10/04/2021 13:35       Assessment & Plan:   Problem List Items Addressed This Visit     [place]    Evaluated by surgery.  Currently eating.  Weight is up.  Bowels moving.  Follow.  Elected no further intervention.       Cerebral atrophy ([place]    Noted on recent scan.  Saw neurology.  On seroquel.  Follow         Dementia without behavioral disturbance ([facility]    Seeing neurology.       History of CVA (cerebrovascular accident)    History of right thalamic infarct.  Continue aspirin.  Completed course of plavix.   Continue risk factor modification.  Will continue aspirin, given history.        History of prostate cancer    Has been followed by urology.        HTN (hypertension) - Primary    Continue lisinopril.  Follow pressures.  Follow metabolic panel.  Blood pressure ok.       Hypercholesterolemia    Intolerance documented to statins.  Follow.       Iron deficiency anemia    Has seen hematology.  Has received iron infusions.        PVD (peripheral vascular disease) ([place])    Carotid ultrasound 2021 - no hemodynamically significant stenosis - moderate heterogenous and calcified plaque. Continue risk factor modification.       Renal mass    Has seen urology.  Likely RCC.       Skin lesion    Skin lesion - left forearm.  See if can get an earlier appt with dermatology.  Sees Dr Nehemiah Massed.       Thickened nails    Refer to podiatry.       Relevant Orders   Ambulatory referral to Podiatry     Einar Pheasant, MD

## 2022-02-18 ENCOUNTER — Encounter: Payer: Self-pay | Admitting: Internal Medicine

## 2022-02-18 ENCOUNTER — Telehealth: Payer: Self-pay | Admitting: Internal Medicine

## 2022-02-18 DIAGNOSIS — L602 Onychogryphosis: Secondary | ICD-10-CM | POA: Insufficient documentation

## 2022-02-18 NOTE — Assessment & Plan Note (Signed)
Carotid ultrasound 2021 - no hemodynamically significant stenosis - moderate heterogenous and calcified plaque. Continue risk factor modification.

## 2022-02-18 NOTE — Assessment & Plan Note (Signed)
Refer to podiatry

## 2022-02-18 NOTE — Assessment & Plan Note (Signed)
Seeing neurology.   

## 2022-02-18 NOTE — Assessment & Plan Note (Signed)
History of right thalamic infarct.  Continue aspirin.  Completed course of plavix.  Continue risk factor modification. Will continue aspirin, given history.   

## 2022-02-18 NOTE — Assessment & Plan Note (Signed)
Noted on recent scan.  Saw neurology.  On seroquel.  Follow

## 2022-02-18 NOTE — Assessment & Plan Note (Signed)
Has seen urology.  Likely RCC.

## 2022-02-18 NOTE — Assessment & Plan Note (Signed)
Evaluated by surgery.  Currently eating.  Weight is up.  Bowels moving.  Follow.  Elected no further intervention.

## 2022-02-18 NOTE — Assessment & Plan Note (Signed)
Intolerance documented to statins.  Follow.  

## 2022-02-18 NOTE — Assessment & Plan Note (Signed)
Continue lisinopril.  Follow pressures.  Follow metabolic panel.  Blood pressure ok.

## 2022-02-18 NOTE — Telephone Encounter (Signed)
Has an appt with Dr Nehemiah Massed 03/2022.  Has left forearm lesion.  Request earlier appt if possible. Please contact and see if possible to see.  Not emergent.

## 2022-02-18 NOTE — Assessment & Plan Note (Signed)
Skin lesion - left forearm.  See if can get an earlier appt with dermatology.  Sees Dr Nehemiah Massed.

## 2022-02-18 NOTE — Assessment & Plan Note (Signed)
Has been followed by urology.   

## 2022-02-18 NOTE — Assessment & Plan Note (Signed)
Has seen hematology.  Has received iron infusions.

## 2022-02-19 ENCOUNTER — Encounter: Payer: Self-pay | Admitting: Internal Medicine

## 2022-02-19 ENCOUNTER — Telehealth: Payer: Self-pay

## 2022-02-19 NOTE — Telephone Encounter (Signed)
Todd Trujillo called from [facility] to state they were able to move patient to Monday, 03/05/2022 at 2:15pm.

## 2022-02-19 NOTE — Telephone Encounter (Signed)
I received [facility] answering service & LM. I requested that patient be placed on cancellation list. I gave his info & MRN as well as our office number if questions. I asked that they possibly;y reach out to patient.

## 2022-02-20 MED FILL — Iron Sucrose Inj 20 MG/ML (Fe Equiv): INTRAVENOUS | Qty: 10 | Status: AC

## 2022-02-21 ENCOUNTER — Inpatient Hospital Stay: Payer: Medicare Other

## 2022-02-21 VITALS — BP 117/54 | HR 71 | Temp 98.0°F | Resp 18

## 2022-02-21 DIAGNOSIS — D649 Anemia, unspecified: Secondary | ICD-10-CM

## 2022-02-21 DIAGNOSIS — D509 Iron deficiency anemia, unspecified: Secondary | ICD-10-CM | POA: Diagnosis not present

## 2022-02-21 MED ORDER — SODIUM CHLORIDE 0.9 % IV SOLN
Freq: Once | INTRAVENOUS | Status: AC
  Filled 2022-02-21: qty 250

## 2022-02-21 MED ORDER — SODIUM CHLORIDE 0.9 % IV SOLN
200.0000 mg | Freq: Once | INTRAVENOUS | Status: AC
  Administered 2022-02-21: 200 mg via INTRAVENOUS
  Filled 2022-02-21: qty 200

## 2022-02-27 MED FILL — Iron Sucrose Inj 20 MG/ML (Fe Equiv): INTRAVENOUS | Qty: 10 | Status: AC

## 2022-02-28 ENCOUNTER — Inpatient Hospital Stay: Payer: Medicare Other

## 2022-02-28 VITALS — BP 116/59 | HR 65 | Temp 97.0°F | Resp 17

## 2022-02-28 DIAGNOSIS — D649 Anemia, unspecified: Secondary | ICD-10-CM

## 2022-02-28 DIAGNOSIS — D509 Iron deficiency anemia, unspecified: Secondary | ICD-10-CM | POA: Diagnosis not present

## 2022-02-28 MED ORDER — SODIUM CHLORIDE 0.9 % IV SOLN
200.0000 mg | Freq: Once | INTRAVENOUS | Status: AC
  Administered 2022-02-28: 200 mg via INTRAVENOUS
  Filled 2022-02-28: qty 200

## 2022-02-28 MED ORDER — SODIUM CHLORIDE 0.9% FLUSH
10.0000 mL | Freq: Once | INTRAVENOUS | Status: AC | PRN
  Administered 2022-02-28: 10 mL
  Filled 2022-02-28: qty 10

## 2022-02-28 MED ORDER — SODIUM CHLORIDE 0.9 % IV SOLN
Freq: Once | INTRAVENOUS | Status: AC
  Filled 2022-02-28: qty 250

## 2022-02-28 NOTE — Patient Instructions (Signed)

## 2022-02-28 NOTE — Progress Notes (Signed)
Patient tolerated Venofer infusion well, no questions/concerns voiced. Patient stable at discharge. AVS given.   ?

## 2022-03-01 ENCOUNTER — Ambulatory Visit (INDEPENDENT_AMBULATORY_CARE_PROVIDER_SITE_OTHER): Payer: Medicare Other | Admitting: Podiatry

## 2022-03-01 DIAGNOSIS — M79675 Pain in left toe(s): Secondary | ICD-10-CM

## 2022-03-01 DIAGNOSIS — B351 Tinea unguium: Secondary | ICD-10-CM | POA: Diagnosis not present

## 2022-03-01 DIAGNOSIS — M79674 Pain in right toe(s): Secondary | ICD-10-CM | POA: Diagnosis not present

## 2022-03-01 NOTE — Progress Notes (Signed)
  Subjective:  Patient ID: Todd Trujillo., male    DOB: 03/17/1930,  MRN: [id number]  Chief Complaint  Patient presents with   nailcare    Routine foot care    88 y.o. male returns for the above complaint.  Patient presents with thickened elongated dystrophic toenails x10 mild pain on palpation.  Patient states painful to touch.  Objective:  There were no vitals filed for this visit. Podiatric Exam: Vascular: dorsalis pedis and posterior tibial pulses are palpable bilateral. Capillary return is immediate. Temperature gradient is WNL. Skin turgor WNL  Sensorium: Normal Semmes Weinstein monofilament test. Normal tactile sensation bilaterally. Nail Exam: Pt has thick disfigured discolored nails with subungual debris noted bilateral entire nail hallux through fifth toenails.  Pain on palpation to the nails. Ulcer Exam: There is no evidence of ulcer or pre-ulcerative changes or infection. Orthopedic Exam: Muscle tone and strength are WNL. No limitations in general ROM. No crepitus or effusions noted.  Skin: No Porokeratosis. No infection or ulcers    Assessment & Plan:   1. Pain due to onychomycosis of toenails of both feet     Patient was evaluated and treated and all questions answered.  Onychomycosis with pain  -Nails palliatively debrided as below. -Educated on self-care  Procedure: Nail Debridement Rationale: pain  Type of Debridement: manual, sharp debridement. Instrumentation: Nail nipper, rotary burr. Number of Nails: 10  Procedures and Treatment: Consent by patient was obtained for treatment procedures. The patient understood the discussion of treatment and procedures well. All questions were answered thoroughly reviewed. Debridement of mycotic and hypertrophic toenails, 1 through 5 bilateral and clearing of subungual debris. No ulceration, no infection noted.  Return Visit-Office Procedure: Patient instructed to return to the office for a follow up visit 3  months for continued evaluation and treatment.  Boneta Lucks, DPM    No follow-ups on file.

## 2022-03-05 ENCOUNTER — Telehealth: Payer: Self-pay | Admitting: Family

## 2022-03-05 ENCOUNTER — Ambulatory Visit (INDEPENDENT_AMBULATORY_CARE_PROVIDER_SITE_OTHER): Payer: Medicare Other | Admitting: Dermatology

## 2022-03-05 DIAGNOSIS — L578 Other skin changes due to chronic exposure to nonionizing radiation: Secondary | ICD-10-CM

## 2022-03-05 DIAGNOSIS — D692 Other nonthrombocytopenic purpura: Secondary | ICD-10-CM | POA: Diagnosis not present

## 2022-03-05 DIAGNOSIS — C44629 Squamous cell carcinoma of skin of left upper limb, including shoulder: Secondary | ICD-10-CM | POA: Diagnosis not present

## 2022-03-05 DIAGNOSIS — D485 Neoplasm of uncertain behavior of skin: Secondary | ICD-10-CM

## 2022-03-05 NOTE — Patient Instructions (Signed)
Wound Care Instructions  Cleanse wound gently with soap and water once a day then pat dry with clean gauze. Apply a thin coat of Petrolatum (petroleum jelly, "Vaseline") over the wound (unless you have an allergy to this). We recommend that you use a new, sterile tube of Vaseline. Do not pick or remove scabs. Do not remove the yellow or white "healing tissue" from the base of the wound.  Cover the wound with fresh, clean, nonstick gauze and secure with paper tape. You may use Band-Aids in place of gauze and tape if the wound is small enough, but would recommend trimming much of the tape off as there is often too much. Sometimes Band-Aids can irritate the skin.  You should call the office for your biopsy report after 1 week if you have not already been contacted.  If you experience any problems, such as abnormal amounts of bleeding, swelling, significant bruising, significant pain, or evidence of infection, please call the office immediately.  FOR ADULT SURGERY PATIENTS: If you need something for pain relief you may take 1 extra strength Tylenol (acetaminophen) AND 2 Ibuprofen (200mg each) together every 4 hours as needed for pain. (do not take these if you are allergic to them or if you have a reason you should not take them.) Typically, you may only need pain medication for 1 to 3 days.     Due to recent changes in healthcare laws, you may see results of your pathology and/or laboratory studies on MyChart before the doctors have had a chance to review them. We understand that in some cases there may be results that are confusing or concerning to you. Please understand that not all results are received at the same time and often the doctors may need to interpret multiple results in order to provide you with the best plan of care or course of treatment. Therefore, we ask that you please give us 2 business days to thoroughly review all your results before contacting the office for clarification. Should  we see a critical lab result, you will be contacted sooner.   If You Need Anything After Your Visit  If you have any questions or concerns for your doctor, please call our main line at [phone] and press option 4 to reach your doctor's medical assistant. If no one answers, please leave a voicemail as directed and we will return your call as soon as possible. Messages left after 4 pm will be answered the following business day.   You may also send us a message via MyChart. We typically respond to MyChart messages within 1-2 business days.  For prescription refills, please ask your pharmacy to contact our office. Our fax number is [phone].  If you have an urgent issue when the clinic is closed that cannot wait until the next business day, you can page your doctor at the number below.    Please note that while we do our best to be available for urgent issues outside of office hours, we are not available 24/7.   If you have an urgent issue and are unable to reach us, you may choose to seek medical care at your doctor's office, retail clinic, urgent care center, or emergency room.  If you have a medical emergency, please immediately call 911 or go to the emergency department.  Pager Numbers  - Dr. Kowalski: [phone]  - Dr. Moye: [phone]  - Dr. Stewart: [phone]  In the event of inclement weather, please call our main line at   [phone] for an update on the status of any delays or closures.  Dermatology Medication Tips: Please keep the boxes that topical medications come in in order to help keep track of the instructions about where and how to use these. Pharmacies typically print the medication instructions only on the boxes and not directly on the medication tubes.   If your medication is too expensive, please contact our office at [phone] option 4 or send us a message through MyChart.   We are unable to tell what your co-pay for medications will be in  advance as this is different depending on your insurance coverage. However, we may be able to find a substitute medication at lower cost or fill out paperwork to get insurance to cover a needed medication.   If a prior authorization is required to get your medication covered by your insurance company, please allow us 1-2 business days to complete this process.  Drug prices often vary depending on where the prescription is filled and some pharmacies may offer cheaper prices.  The website www.goodrx.com contains coupons for medications through different pharmacies. The prices here do not account for what the cost may be with help from insurance (it may be cheaper with your insurance), but the website can give you the price if you did not use any insurance.  - You can print the associated coupon and take it with your prescription to the pharmacy.  - You may also stop by our office during regular business hours and pick up a GoodRx coupon card.  - If you need your prescription sent electronically to a different pharmacy, notify our office through [facility] MyChart or by phone at [phone] option 4.     Si Usted Necesita Algo Despus de Su Visita  Tambin puede enviarnos un mensaje a travs de MyChart. Por lo general respondemos a los mensajes de MyChart en el transcurso de 1 a 2 das hbiles.  Para renovar recetas, por favor pida a su farmacia que se ponga en contacto con nuestra oficina. Nuestro nmero de fax es el [phone].  Si tiene un asunto urgente cuando la clnica est cerrada y que no puede esperar hasta el siguiente da hbil, puede llamar/localizar a su doctor(a) al nmero que aparece a continuacin.   Por favor, tenga en cuenta que aunque hacemos todo lo posible para estar disponibles para asuntos urgentes fuera del horario de oficina, no estamos disponibles las 24 horas del da, los 7 das de la semana.   Si tiene un problema urgente y no puede comunicarse con nosotros, puede  optar por buscar atencin mdica  en el consultorio de su doctor(a), en una clnica privada, en un centro de atencin urgente o en una sala de emergencias.  Si tiene una emergencia mdica, por favor llame inmediatamente al 911 o vaya a la sala de emergencias.  Nmeros de bper  - Dr. Kowalski: [phone]  - Dra. Moye: [phone]  - Dra. Stewart: [phone]  En caso de inclemencias del tiempo, por favor llame a nuestra lnea principal al [phone] para una actualizacin sobre el estado de cualquier retraso o cierre.  Consejos para la medicacin en dermatologa: Por favor, guarde las cajas en las que vienen los medicamentos de uso tpico para ayudarle a seguir las instrucciones sobre dnde y cmo usarlos. Las farmacias generalmente imprimen las instrucciones del medicamento slo en las cajas y no directamente en los tubos del medicamento.   Si su medicamento es muy caro, por favor, pngase en contacto con   nuestra oficina llamando al [phone] y presione la opcin 4 o envenos un mensaje a travs de MyChart.   No podemos decirle cul ser su copago por los medicamentos por adelantado ya que esto es diferente dependiendo de la cobertura de su seguro. Sin embargo, es posible que podamos encontrar un medicamento sustituto a menor costo o llenar un formulario para que el seguro cubra el medicamento que se considera necesario.   Si se requiere una autorizacin previa para que su compaa de seguros cubra su medicamento, por favor permtanos de 1 a 2 das hbiles para completar este proceso.  Los precios de los medicamentos varan con frecuencia dependiendo del lugar de dnde se surte la receta y alguna farmacias pueden ofrecer precios ms baratos.  El sitio web www.goodrx.com tiene cupones para medicamentos de diferentes farmacias. Los precios aqu no tienen en cuenta lo que podra costar con la ayuda del seguro (puede ser ms barato con su seguro), pero el sitio web puede darle el  precio si no utiliz ningn seguro.  - Puede imprimir el cupn correspondiente y llevarlo con su receta a la farmacia.  - Tambin puede pasar por nuestra oficina durante el horario de atencin regular y recoger una tarjeta de cupones de GoodRx.  - Si necesita que su receta se enve electrnicamente a una farmacia diferente, informe a nuestra oficina a travs de MyChart de [facility] o por telfono llamando al [phone] y presione la opcin 4.  

## 2022-03-05 NOTE — Progress Notes (Signed)
Follow-Up Visit   Subjective  Todd Trujillo. is a 88 y.o. male who presents for the following: Other (Spot of left forearm that has grown and is tender). The patient has spots, moles and lesions to be evaluated, some may be new or changing and the patient has concerns that these could be cancer.  Accompanied by daughter  The following portions of the chart were reviewed this encounter and updated as appropriate:   Tobacco  Allergies  Meds  Problems  Med Hx  Surg Hx  Fam Hx     Review of Systems:  No other skin or systemic complaints except as noted in HPI or Assessment and Plan.  Objective  Well appearing patient in no apparent distress; mood and affect are within normal limits.  A focused examination was performed including left forearm. Relevant physical exam findings are noted in the Assessment and Plan.  Left Forearm - Posterior Hyperkeratotic papule 1.8 cm   Assessment & Plan   Actinic Damage - chronic, secondary to cumulative UV radiation exposure/sun exposure over time - diffuse scaly erythematous macules with underlying dyspigmentation - Recommend daily broad spectrum sunscreen SPF 30+ to sun-exposed areas, reapply every 2 hours as needed.  - Recommend staying in the shade or wearing long sleeves, sun glasses (UVA+UVB protection) and wide brim hats (4-inch brim around the entire circumference of the hat). - Call for new or changing lesions.  Purpura - Chronic; persistent and recurrent.  Treatable, but not curable. - Violaceous macules and patches - Benign - Related to trauma, age, sun damage and/or use of blood thinners, chronic use of topical and/or oral steroids - Observe - Can use OTC arnica containing moisturizer such as Dermend Bruise Formula if desired - Call for worsening or other concerns  Neoplasm of uncertain behavior of skin Left Forearm - Posterior  Epidermal / dermal shaving  Lesion diameter (cm):  1.8 Informed consent:  discussed and consent obtained   Timeout: patient name, date of birth, surgical site, and procedure verified   Procedure prep:  Patient was prepped and draped in usual sterile fashion Prep type:  Isopropyl alcohol Anesthesia: the lesion was anesthetized in a standard fashion   Anesthetic:  1% lidocaine w/ epinephrine 1-100,000 buffered w/ 8.4% NaHCO3 Instrument used: flexible razor blade   Hemostasis achieved with: pressure, aluminum chloride and electrodesiccation   Outcome: patient tolerated procedure well   Post-procedure details: sterile dressing applied and wound care instructions given   Dressing type: bandage and petrolatum    Destruction of lesion Complexity: extensive   Destruction method: electrodesiccation and curettage   Informed consent: discussed and consent obtained   Timeout:  patient name, date of birth, surgical site, and procedure verified Procedure prep:  Patient was prepped and draped in usual sterile fashion Prep type:  Isopropyl alcohol Anesthesia: the lesion was anesthetized in a standard fashion   Anesthetic:  1% lidocaine w/ epinephrine 1-100,000 buffered w/ 8.4% NaHCO3 Curettage performed in three different directions: Yes   Electrodesiccation performed over the curetted area: Yes   Lesion length (cm):  1.8 Lesion width (cm):  1.8 Margin per side (cm):  0.2 Final wound size (cm):  2.2 Hemostasis achieved with:  pressure and aluminum chloride Outcome: patient tolerated procedure well with no complications   Post-procedure details: sterile dressing applied and wound care instructions given   Dressing type: bandage and petrolatum    Specimen 1 - Surgical pathology Differential Diagnosis: SCC vs other  Check Margins: No EDC today  Return in about 6 months (around 09/05/2022).  I, Ashok Cordia, CMA, am acting as scribe for Sarina Ser, MD . Documentation: I have reviewed the above documentation for accuracy and completeness, and I agree with the  above.  Sarina Ser, MD

## 2022-03-07 ENCOUNTER — Telehealth: Payer: Self-pay

## 2022-03-07 NOTE — Telephone Encounter (Signed)
-----   Message from Ralene Bathe, MD sent at 03/06/2022  5:51 PM EDT ----- Diagnosis Skin , left forearm-posterior WELL DIFFERENTIATED SQUAMOUS CELL CARCINOMA, BASE INVOLVED  Cancer - SCC Already treated Recheck next visit

## 2022-03-07 NOTE — Telephone Encounter (Signed)
No answer

## 2022-03-09 ENCOUNTER — Encounter: Payer: Self-pay | Admitting: Dermatology

## 2022-03-27 ENCOUNTER — Telehealth: Payer: Self-pay

## 2022-03-27 ENCOUNTER — Other Ambulatory Visit: Payer: Self-pay | Admitting: Internal Medicine

## 2022-03-27 NOTE — Telephone Encounter (Signed)
-----   Message from Ralene Bathe, MD sent at 03/06/2022  5:51 PM EDT ----- Diagnosis Skin , left forearm-posterior WELL DIFFERENTIATED SQUAMOUS CELL CARCINOMA, BASE INVOLVED  Cancer - SCC Already treated Recheck next visit

## 2022-03-27 NOTE — Telephone Encounter (Signed)
Todd Trujillo informed of pathology results.

## 2022-03-28 NOTE — Telephone Encounter (Signed)
Rx ok'd for seroquel #30 with one refill

## 2022-04-04 ENCOUNTER — Ambulatory Visit: Payer: Medicare Other | Admitting: Dermatology

## 2022-04-10 ENCOUNTER — Ambulatory Visit: Payer: Medicare Other

## 2022-04-10 ENCOUNTER — Other Ambulatory Visit: Payer: Self-pay

## 2022-04-10 ENCOUNTER — Other Ambulatory Visit: Payer: Self-pay | Admitting: *Deleted

## 2022-04-10 DIAGNOSIS — D649 Anemia, unspecified: Secondary | ICD-10-CM

## 2022-04-10 MED ORDER — SERTRALINE HCL 25 MG PO TABS: 25.0000 mg | ORAL_TABLET | Freq: Every day | ORAL | 0 refills | Status: DC

## 2022-04-10 NOTE — Telephone Encounter (Signed)
Pt need a refill on [facility]raline [facility]

## 2022-04-11 ENCOUNTER — Inpatient Hospital Stay: Payer: Medicare Other

## 2022-04-11 ENCOUNTER — Encounter: Payer: Self-pay | Admitting: Internal Medicine

## 2022-04-11 ENCOUNTER — Inpatient Hospital Stay (HOSPITAL_BASED_OUTPATIENT_CLINIC_OR_DEPARTMENT_OTHER): Payer: Medicare Other | Admitting: Internal Medicine

## 2022-04-11 ENCOUNTER — Inpatient Hospital Stay: Payer: Medicare Other | Attending: Internal Medicine

## 2022-04-11 VITALS — BP 117/55 | HR 69 | Temp 97.4°F | Resp 16

## 2022-04-11 DIAGNOSIS — Z8546 Personal history of malignant neoplasm of prostate: Secondary | ICD-10-CM | POA: Insufficient documentation

## 2022-04-11 DIAGNOSIS — K6389 Other specified diseases of intestine: Secondary | ICD-10-CM | POA: Diagnosis not present

## 2022-04-11 DIAGNOSIS — D649 Anemia, unspecified: Secondary | ICD-10-CM

## 2022-04-11 DIAGNOSIS — K5900 Constipation, unspecified: Secondary | ICD-10-CM | POA: Diagnosis not present

## 2022-04-11 DIAGNOSIS — I10 Essential (primary) hypertension: Secondary | ICD-10-CM | POA: Insufficient documentation

## 2022-04-11 DIAGNOSIS — D500 Iron deficiency anemia secondary to blood loss (chronic): Secondary | ICD-10-CM | POA: Insufficient documentation

## 2022-04-11 DIAGNOSIS — Z85828 Personal history of other malignant neoplasm of skin: Secondary | ICD-10-CM | POA: Diagnosis not present

## 2022-04-11 LAB — COMPREHENSIVE METABOLIC PANEL
ALT: 10 U/L (ref 0–44)
AST: 17 U/L (ref 15–41)
Albumin: 3.3 g/dL — ABNORMAL LOW (ref 3.5–5.0)
Alkaline Phosphatase: 85 U/L (ref 38–126)
Anion gap: 6 (ref 5–15)
BUN: 20 mg/dL (ref 8–23)
CO2: 28 mmol/L (ref 22–32)
Calcium: 8.9 mg/dL (ref 8.9–10.3)
Chloride: 104 mmol/L (ref 98–111)
Creatinine, Ser: 0.9 mg/dL (ref 0.61–1.24)
GFR, Estimated: >60 mL/min (ref >60)
Glucose, Bld: 102 mg/dL — ABNORMAL HIGH (ref 70–99)
Potassium: 4 mmol/L (ref 3.5–5.1)
Sodium: 138 mmol/L (ref 135–145)
Total Bilirubin: 0.5 mg/dL (ref 0.3–1.2)
Total Protein: 6.8 g/dL (ref 6.5–8.1)

## 2022-04-11 LAB — CBC WITH DIFFERENTIAL/PLATELET
Abs Immature Granulocytes: 0.03 10*3/uL (ref 0.00–0.07)
Basophils Absolute: 0.1 10*3/uL (ref 0.0–0.1)
Basophils Relative: 1 %
Eosinophils Absolute: 0.5 10*3/uL (ref 0.0–0.5)
Eosinophils Relative: 5 %
HCT: 33.3 % — ABNORMAL LOW (ref 39.0–52.0)
Hemoglobin: 10.4 g/dL — ABNORMAL LOW (ref 13.0–17.0)
Immature Granulocytes: 0 %
Lymphocytes Relative: 25 %
Lymphs Abs: 2.7 10*3/uL (ref 0.7–4.0)
MCH: 29.4 pg (ref 26.0–34.0)
MCHC: 31.2 g/dL (ref 30.0–36.0)
MCV: 94.1 fL (ref 80.0–100.0)
Monocytes Absolute: 1.1 10*3/uL — ABNORMAL HIGH (ref 0.1–1.0)
Monocytes Relative: 10 %
Neutro Abs: 6.7 10*3/uL (ref 1.7–7.7)
Neutrophils Relative %: 59 %
Platelets: 304 10*3/uL (ref 150–400)
RBC: 3.54 MIL/uL — ABNORMAL LOW (ref 4.22–5.81)
RDW: 15.5 % (ref 11.5–15.5)
WBC: 11.1 10*3/uL — ABNORMAL HIGH (ref 4.0–10.5)
nRBC: 0 % (ref 0.0–0.2)

## 2022-04-11 LAB — IRON AND TIBC
Iron: 26 ug/dL — ABNORMAL LOW (ref 45–182)
Saturation Ratios: 14 % — ABNORMAL LOW (ref 17.9–39.5)
TIBC: 190 ug/dL — ABNORMAL LOW (ref 250–450)
UIBC: 164 ug/dL

## 2022-04-11 LAB — FERRITIN: Ferritin: 272 ng/mL (ref 24–336)

## 2022-04-11 MED ORDER — SODIUM CHLORIDE 0.9 % IV SOLN
200.0000 mg | Freq: Once | INTRAVENOUS | Status: AC
  Administered 2022-04-11: 200 mg via INTRAVENOUS
  Filled 2022-04-11: qty 200

## 2022-04-11 MED ORDER — SODIUM CHLORIDE 0.9 % IV SOLN
Freq: Once | INTRAVENOUS | Status: AC
  Filled 2022-04-11: qty 250

## 2022-04-11 NOTE — Assessment & Plan Note (Signed)
#   Symptomatic anemia: Secondary to blood loss from above causes-hemoglobin today is 10.2  Proceed with Venofer today.   #Right colon mass-highly suspicious for malignancy given the imaging findings.  However patient not a surgical candidate status post evaluation with surgery [Dr.Rodenberg].  Previously discussed with Dr. Spaete-colonic stents are technically challenging; delcined further evaluation. Discussed re: repeating scan- but hold off.   # Cystic mass Left kidney approximately 4.5 cm in size: Again highly suspicious for malignancy based on imaging findings.s/p Evaluation with Urology, Dr.Siniski.   #Constipation: Intermittent abdominal pain.  Recommend MiraLAX as needed once a day.   # DISPOSITION: # Venofer today #  in 2 weeks & in 4 weeks- venofer   # Follow up in  2nd week of JAN 2024--; MD labs- cbc/cmp;iron studies/ferritin-  possible venofer-Dr.B

## 2022-04-11 NOTE — Progress Notes (Unsigned)
Patient here for follow up. Patient and daughter decline any concerns at this time.

## 2022-04-11 NOTE — Progress Notes (Unsigned)
[facility] CONSULT NOTE  Patient Care Team: Einar Pheasant, MD as PCP - General (Internal Medicine)  CHIEF COMPLAINTS/PURPOSE OF CONSULTATION: ANEMIA   HEMATOLOGY HISTORY  # ANEMIA 2022 MARCH  Hb- nadir April 2022 [6 s/p PRBC transfusion; Hip fracture; s/p Sx- stroke]  WBC/platelets- WNL;  Iron sat-4.2% ferritin 68; creatinine normal EGD/[facility]->> [facility] years [facility]]  # MARCH 2023-CT scan abdomen pelvis-right-sided colon mass with history of malignancy; left-sided kidney mass 4.5 cm suggestive of malignancy  #November 2022 stroke [rehab]   Latest Reference Range & Units 08/18/21 12:30  Iron 42 - 165 ug/dL <10 (L)  TIBC 250.0 - 450.0 mcg/dL 238.0 (L)  Saturation Ratios 20.0 - 50.0 % 4.2 (L)  Ferritin 22.0 - 322.0 ng/mL 68.5  Transferrin 212.0 - 360.0 mg/dL 170.0 (L)  (L): Data is abnormally low  Hx of strokes  HISTORY OF PRESENTING ILLNESS: in a wheel chair; in gait belt; with daughter. HOH.  Elderly frail appearing Caucasian male patient.  Todd Trujillo. 88 y.o.  male pleasant patient right-sided colon mass suggestive of colon cancer; left kidney mass again suggestive of malignancy is here for follow-up.  In the interim evaluated by urology-recommended conservative management given his overall poor prognosis given unresectable colon cancer.  As per the daughter patient had a recent "head cold"; and hence this procedure/colonoscopy possible stenting acute was postponed.  Denies any worsening abdominal pain nausea vomiting.  Given the concerns for tolerance for bowel prep for the endoscopy at [facility] is reluctant with any further consultation at [facility].  No fevers or Chills.  No worsening shortness of breath.   As per the daughter complains of significant weight loss.    Review of Systems  Constitutional:  Positive for malaise/fatigue and weight loss. Negative for chills, diaphoresis and fever.  HENT:  Negative for nosebleeds and sore throat.    Eyes:  Negative for double vision.  Respiratory:  Negative for cough, hemoptysis, sputum production, shortness of breath and wheezing.   Cardiovascular:  Negative for chest pain, palpitations, orthopnea and leg swelling.  Gastrointestinal:  Negative for abdominal pain, blood in stool, constipation, diarrhea, heartburn, melena, nausea and vomiting.  Genitourinary:  Negative for dysuria, frequency and urgency.  Musculoskeletal:  Positive for back pain and joint pain.  Skin: Negative.  Negative for itching and rash.  Neurological:  Negative for dizziness, tingling, focal weakness, weakness and headaches.  Endo/Heme/Allergies:  Does not bruise/bleed easily.  Psychiatric/Behavioral:  Negative for depression. The patient is not nervous/anxious and does not have insomnia.     MEDICAL HISTORY:  Past Medical History:  Diagnosis Date  . Arthritis   . Cancer [place])    prostate  . Hyperlipidemia   . Hypertension   . Left wrist fracture   . Macular degeneration of right eye   . Nephrolithiasis   . Prostate CA ([place]) 2003  . Prostate troubles    Patient was unsure of term  . Squamous cell carcinoma of skin 01/23/2017   R distal lat bicep near elbow  . Squamous cell carcinoma of skin 03/05/2022   L forearm posterior - tx with ED&C    SURGICAL HISTORY: Past Surgical History:  Procedure Laterality Date  . INTRAMEDULLARY (IM) NAIL INTERTROCHANTERIC Right 06/07/2021   Procedure: INTRAMEDULLARY (IM) NAIL INTERTROCHANTRIC;  Surgeon: Thornton Park, MD;  Location: [facility] ORS;  Service: Orthopedics;  Laterality: Right;  . TOTAL KNEE ARTHROPLASTY Right     SOCIAL HISTORY: Social History   Socioeconomic History  . Marital status:  Married    Spouse name: Not on file  . Number of children: Not on file  . Years of education: Not on file  . Highest education level: Not on file  Occupational History  . Not on file  Tobacco Use  . Smoking status: Never    Passive exposure: Never  . Smokeless  tobacco: Never  Vaping Use  . Vaping Use: Never used  Substance and Sexual Activity  . Alcohol use: No  . Drug use: Not Currently  . Sexual activity: Not on file  Other Topics Concern  . Not on file  Social History Narrative   Patient lives at this wife; daughter; disabled granddaughter.  Limited mobility.  No smoking or alcohol.    Social Determinants of Health   Financial Resource Strain: Not on file  Food Insecurity: Not on file  Transportation Needs: Not on file  Physical Activity: Not on file  Stress: Not on file  Social Connections: Not on file  Intimate Partner Violence: Not on file    FAMILY HISTORY: Family History  Problem Relation Age of Onset  . Colon cancer Mother   . Esophageal cancer Brother   . Breast cancer Daughter     ALLERGIES:  is allergic to codeine.  MEDICATIONS:  Current Outpatient Medications  Medication Sig Dispense Refill  . acetaminophen (TYLENOL) 500 MG tablet Take 500 mg by mouth every 6 (six) hours as needed.    Marland Kitchen aspirin EC 81 MG tablet Take 1 tablet (81 mg total) by mouth daily. Swallow whole. 30 tablet 0  . cholecalciferol (VITAMIN D3) 25 MCG (1000 UNIT) tablet Take 1,000 Units by mouth daily.    . Nutritional Supplements (FEEDING SUPPLEMENT, NEPRO CARB STEADY,) LIQD Take 237 mLs by mouth 2 (two) times daily between meals.  0  . omeprazole (PRILOSEC) 40 MG capsule Take 40 mg by mouth daily.    . QUEtiapine (SEROQUEL) 50 MG tablet TAKE 1 TABLET BY MOUTH AT BEDTIME. 30 tablet 1  . sertraline (ZOLOFT) 25 MG tablet Take 1 tablet (25 mg total) by mouth daily. 30 tablet 0   No current facility-administered medications for this visit.     Marland Kitchen  PHYSICAL EXAMINATION:   There were no vitals filed for this visit.  There were no vitals filed for this visit.    Physical Exam Vitals and nursing note reviewed.  HENT:     Head: Normocephalic and atraumatic.     Mouth/Throat:     Pharynx: Oropharynx is clear.  Eyes:     Extraocular  Movements: Extraocular movements intact.     Pupils: Pupils are equal, round, and reactive to light.  Cardiovascular:     Rate and Rhythm: Normal rate and regular rhythm.  Pulmonary:     Comments: Decreased breath sounds bilaterally.  Abdominal:     Palpations: Abdomen is soft.  Musculoskeletal:        General: Normal range of motion.     Cervical back: Normal range of motion.  Skin:    General: Skin is warm.  Neurological:     General: No focal deficit present.     Mental Status: He is alert and oriented to person, place, and time.  Psychiatric:        Behavior: Behavior normal.        Judgment: Judgment normal.     LABORATORY DATA:  I have reviewed the data as listed Lab Results  Component Value Date   WBC 10.6 (H) 02/07/2022   HGB 10.1 (  L) 02/07/2022   HCT 32.8 (L) 02/07/2022   MCV 94.0 02/07/2022   PLT 274 02/07/2022   Recent Labs    04/18/21 1302 06/06/21 2148 08/18/21 1230 09/15/21 1508 11/16/21 1302 12/08/21 1253 02/07/22 1304  NA 139   < > 141   < > 136 137 138  K 4.4   < > 4.4   < > 4.4 3.9 4.1  CL 102   < > 103   < > 103 103 104  CO2 28   < > 30   < > '27 27 29  '$ GLUCOSE 85   < > 104*   < > 120* 117* 103*  BUN 21   < > 21   < > '22 16 17  '$ CREATININE 1.06   < > 0.83   < > 0.91 0.91 0.91  CALCIUM 9.1   < > 9.6   < > 9.0 8.7* 8.9  GFRNONAA  --    < >  --    < > >60 >60 >60  PROT 5.9*   < > 6.2   < > 7.4 6.7 6.4*  ALBUMIN 3.7   < > 3.4*   < > 3.3* 3.3* 3.2*  AST 12   < > 11   < > 18 13* 17  ALT 5   < > 5   < > '10 7 13  '$ ALKPHOS 84   < > 119*   < > 110 95 99  BILITOT 0.4   < > 0.3   < > 0.8 0.7 0.3  BILIDIR 0.1  --  0.1  --   --   --   --    < > = values in this interval not displayed.      No results found.  ASSESSMENT & PLAN:   No problem-specific Assessment & Plan notes found for this encounter.     All questions were answered. The patient knows to call the clinic with any problems, questions or concerns.    Cammie Sickle,  MD 04/11/2022 2:23 PM

## 2022-04-12 ENCOUNTER — Encounter: Payer: Self-pay | Admitting: Internal Medicine

## 2022-04-20 ENCOUNTER — Other Ambulatory Visit: Payer: Self-pay | Admitting: Internal Medicine

## 2022-04-20 NOTE — Telephone Encounter (Signed)
LM with daughter Amy that prescription was sent.

## 2022-04-20 NOTE — Telephone Encounter (Signed)
Refill sent in to pharmacy 

## 2022-04-25 ENCOUNTER — Inpatient Hospital Stay: Payer: Medicare Other | Attending: Internal Medicine

## 2022-04-25 VITALS — BP 116/56 | HR 70 | Temp 96.6°F | Resp 16

## 2022-04-25 DIAGNOSIS — D500 Iron deficiency anemia secondary to blood loss (chronic): Secondary | ICD-10-CM | POA: Insufficient documentation

## 2022-04-25 DIAGNOSIS — D649 Anemia, unspecified: Secondary | ICD-10-CM

## 2022-04-25 DIAGNOSIS — Z79899 Other long term (current) drug therapy: Secondary | ICD-10-CM | POA: Insufficient documentation

## 2022-04-25 MED ORDER — SODIUM CHLORIDE 0.9 % IV SOLN
Freq: Once | INTRAVENOUS | Status: AC
  Filled 2022-04-25: qty 250

## 2022-04-25 MED ORDER — SODIUM CHLORIDE 0.9 % IV SOLN
200.0000 mg | Freq: Once | INTRAVENOUS | Status: AC
  Administered 2022-04-25: 200 mg via INTRAVENOUS
  Filled 2022-04-25: qty 200

## 2022-04-25 NOTE — Patient Instructions (Signed)

## 2022-04-26 IMAGING — CT CT HEAD W/O CM
4 series · 17 of 47 positions shown, 19 images · non-contrast
Comparison: 06/12/2021

CLINICAL DATA: Follow-up subdural hematoma.



[Series 2: head wo · axial · 0.47mm/px · z∈[+236,+356]mm · 7 of 34 slices shown, 9 images]
[im 5/34  brain]
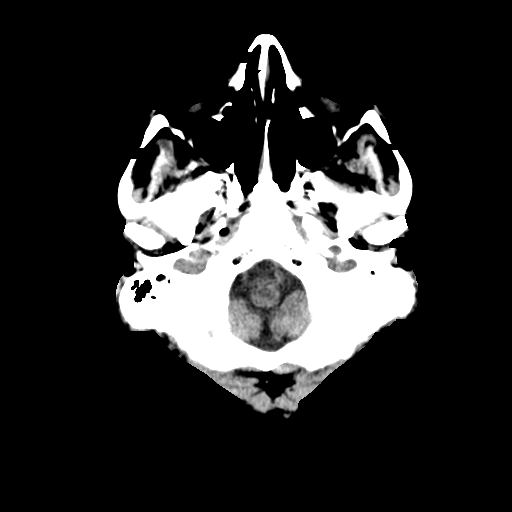
[im 5/34  bone]
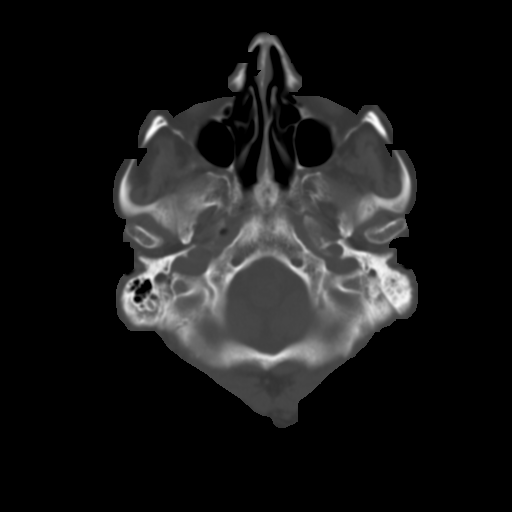
[im 9/34  brain]
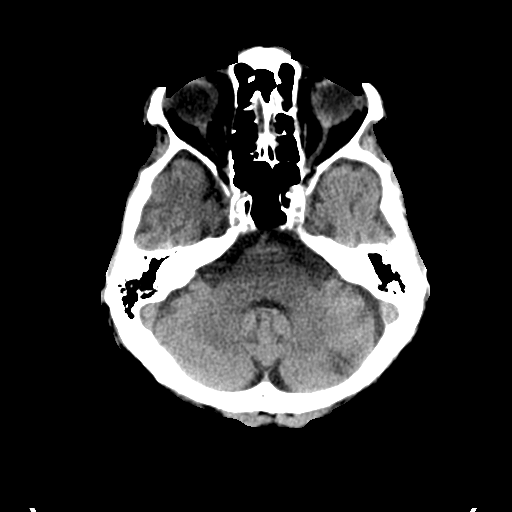
[im 13/34  brain]
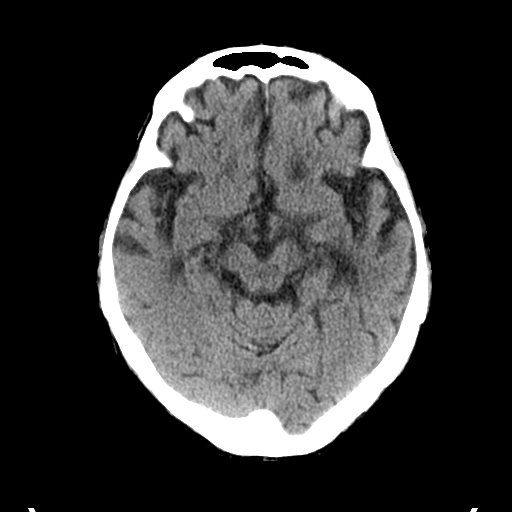
[im 17/34  brain]
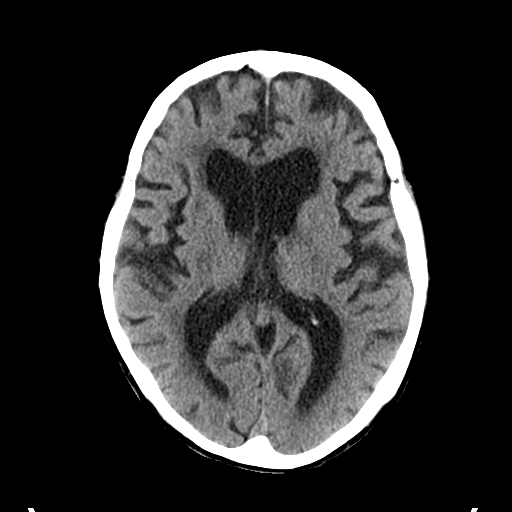
[im 21/34  brain]
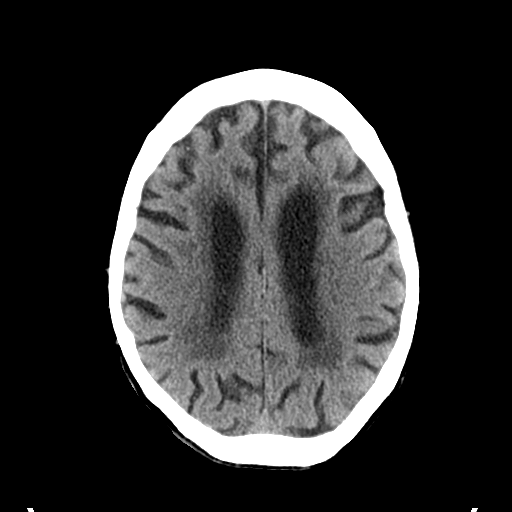
[im 21/34  bone]
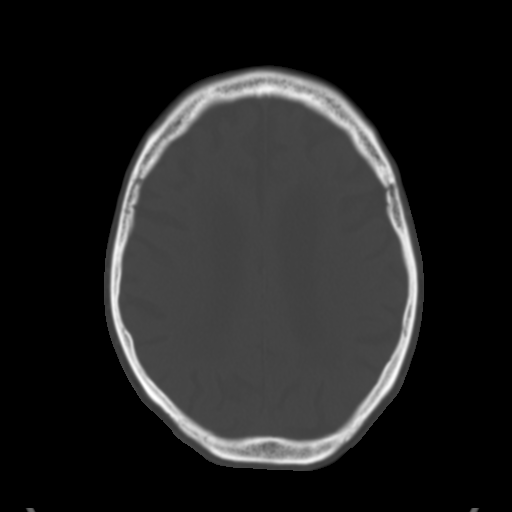
[im 25/34  brain]
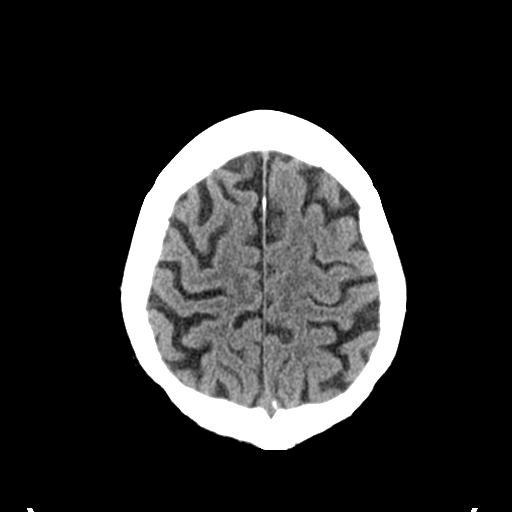
[im 29/34  brain]
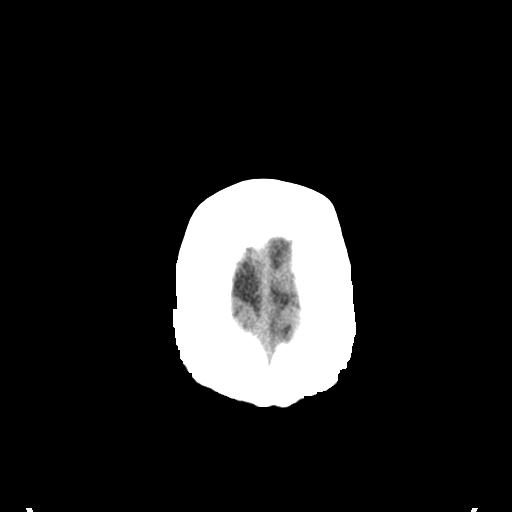

[Series 3: head bone · axial · 0.47mm/px · z∈[+232,+290]mm · 4 of 84 slices shown]
[im 9/84  bone]
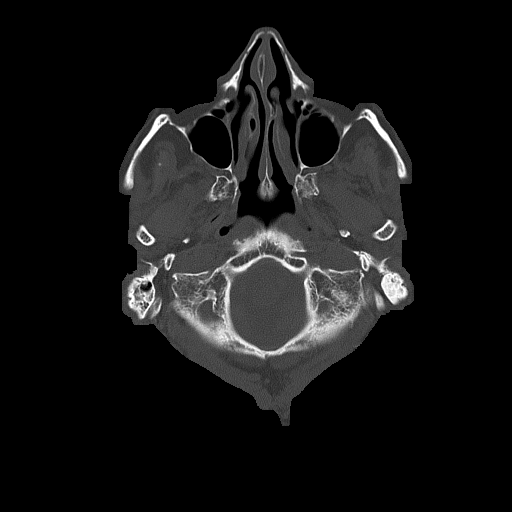
[im 17/84  bone]
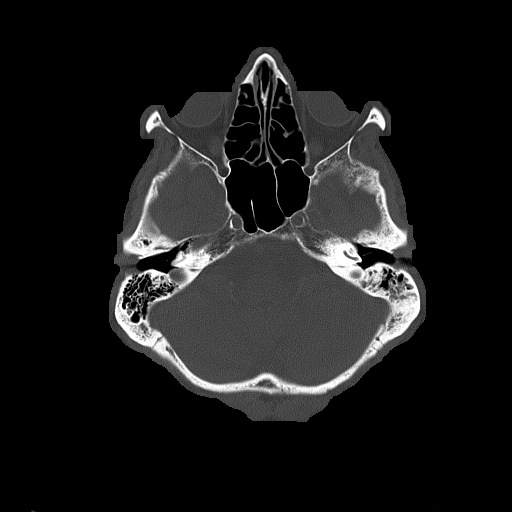
[im 25/84  bone]
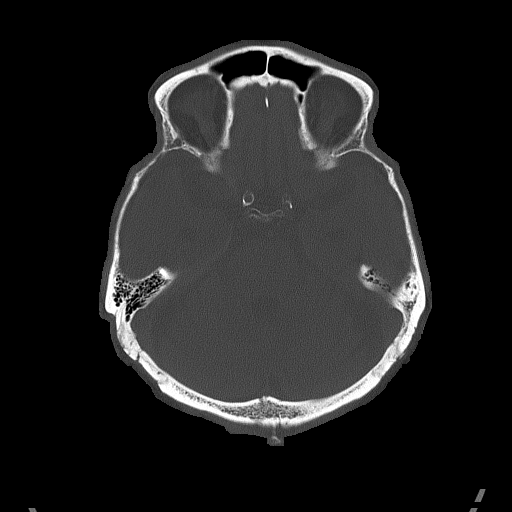
[im 38/84  bone]
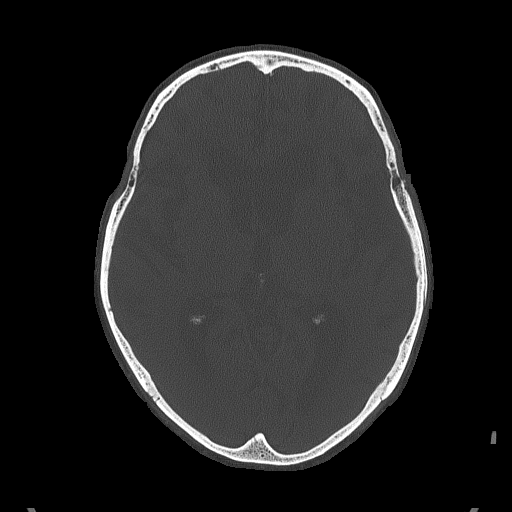

[Series 4: coronal soft tissue · coronal · 0.36mm/px · 3 of 72 slices shown]
[im 24/72  brain]
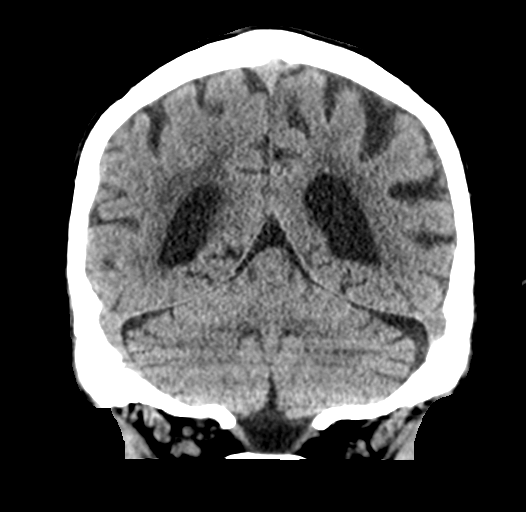
[im 32/72  brain]
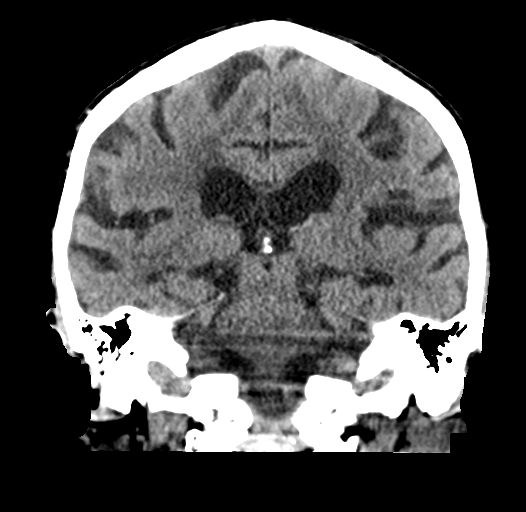
[im 40/72  brain]
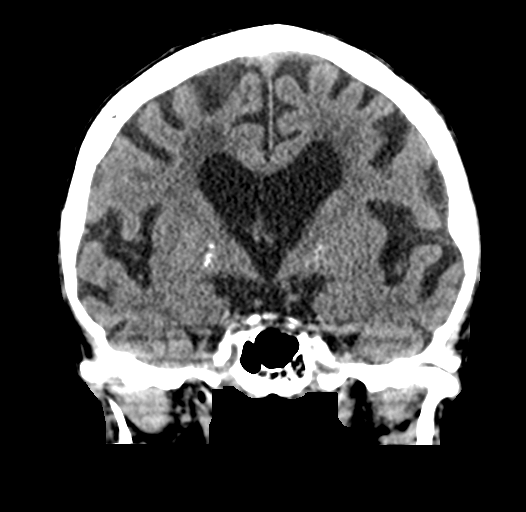

[Series 5: sagittal soft tissue · sagittal · 0.36mm/px · 3 of 58 slices shown]
[im 20/58  brain]
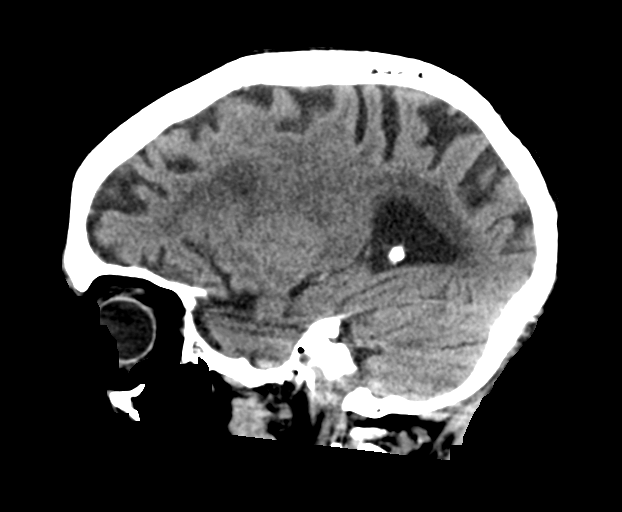
[im 29/58  brain]
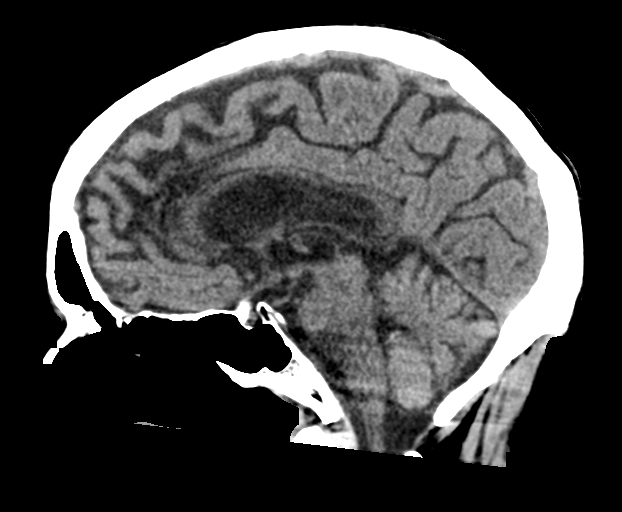
[im 39/58  brain]
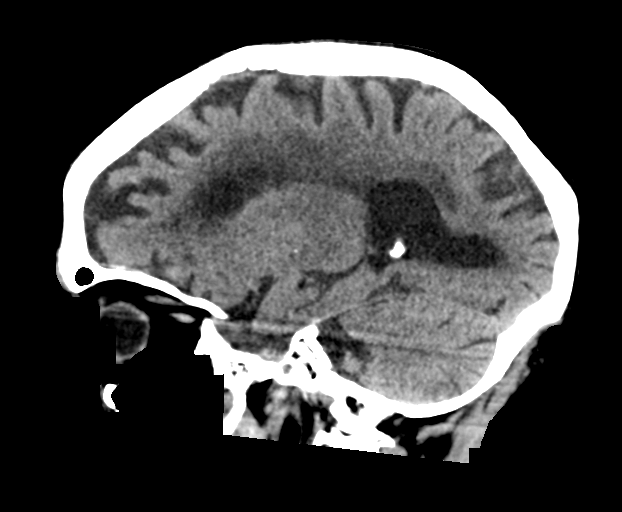

[17 of 47 positions shown; findings below may reference images not displayed]

FINDINGS: Brain: Previously seen small subacute bilateral subdural hematomas
have resolved. There is atrophy and chronic small vessel disease
changes. No acute intracranial abnormality. Specifically, no
hemorrhage, hydrocephalus, mass lesion, acute infarction, or
significant intracranial injury.

Vascular: No hyperdense vessel or unexpected calcification.

Skull: No acute calvarial abnormality.

Sinuses/Orbits: No acute findings

Other: None
IMPRESSION: Resolution of previously seen small bilateral subdural hematomas.

Atrophy, chronic microvascular disease.

No acute intracranial abnormality.

## 2022-05-05 ENCOUNTER — Other Ambulatory Visit: Payer: Self-pay | Admitting: Internal Medicine

## 2022-05-09 ENCOUNTER — Other Ambulatory Visit: Payer: Medicare Other

## 2022-05-09 ENCOUNTER — Inpatient Hospital Stay: Payer: Medicare Other

## 2022-05-09 ENCOUNTER — Ambulatory Visit: Payer: Medicare Other | Admitting: Internal Medicine

## 2022-05-09 VITALS — BP 107/66 | HR 72 | Temp 97.9°F | Resp 18

## 2022-05-09 DIAGNOSIS — D649 Anemia, unspecified: Secondary | ICD-10-CM

## 2022-05-09 DIAGNOSIS — D500 Iron deficiency anemia secondary to blood loss (chronic): Secondary | ICD-10-CM | POA: Diagnosis not present

## 2022-05-09 MED ORDER — SODIUM CHLORIDE 0.9 % IV SOLN
Freq: Once | INTRAVENOUS | Status: AC
  Filled 2022-05-09: qty 250

## 2022-05-09 MED ORDER — SODIUM CHLORIDE 0.9 % IV SOLN
200.0000 mg | Freq: Once | INTRAVENOUS | Status: AC
  Administered 2022-05-09: 200 mg via INTRAVENOUS
  Filled 2022-05-09: qty 200

## 2022-05-09 NOTE — Patient Instructions (Signed)

## 2022-05-29 ENCOUNTER — Telehealth: Payer: Self-pay | Admitting: Internal Medicine

## 2022-05-29 NOTE — Telephone Encounter (Signed)
Copied from [place] [id number]. Topic: Medicare AWV >> May 29, 2022 11:13 AM Devoria Glassing wrote: Reason for CRM: Left message for patient to schedule Annual Wellness Visit.  Please schedule with Nurse Health Advisor Denisa O'Brien-Blaney, LPN at [facility]. This appt can be telephone or office visit.  Please call [phone] ask for [facility]

## 2022-06-08 ENCOUNTER — Other Ambulatory Visit: Payer: Self-pay

## 2022-06-08 ENCOUNTER — Encounter: Payer: Self-pay | Admitting: Radiology

## 2022-06-08 ENCOUNTER — Other Ambulatory Visit: Payer: Self-pay | Admitting: Internal Medicine

## 2022-06-08 ENCOUNTER — Inpatient Hospital Stay
Admission: EM | Admit: 2022-06-08 | Discharge: 2022-06-11 | DRG: 391 | Disposition: A | Discharge status: Home Health | Payer: Medicare Other | Attending: Internal Medicine | Admitting: Internal Medicine

## 2022-06-08 ENCOUNTER — Emergency Department: Payer: Medicare Other

## 2022-06-08 DIAGNOSIS — Z885 Allergy status to narcotic agent status: Secondary | ICD-10-CM

## 2022-06-08 DIAGNOSIS — A09 Infectious gastroenteritis and colitis, unspecified: Secondary | ICD-10-CM | POA: Diagnosis not present

## 2022-06-08 DIAGNOSIS — C188 Malignant neoplasm of overlapping sites of colon: Secondary | ICD-10-CM | POA: Diagnosis present

## 2022-06-08 DIAGNOSIS — F0390 Unspecified dementia without behavioral disturbance: Principal | ICD-10-CM | POA: Diagnosis present

## 2022-06-08 DIAGNOSIS — M1990 Unspecified osteoarthritis, unspecified site: Secondary | ICD-10-CM | POA: Diagnosis present

## 2022-06-08 DIAGNOSIS — R310 Gross hematuria: Secondary | ICD-10-CM

## 2022-06-08 DIAGNOSIS — Z85828 Personal history of other malignant neoplasm of skin: Secondary | ICD-10-CM

## 2022-06-08 DIAGNOSIS — Z8673 Personal history of transient ischemic attack (TIA), and cerebral infarction without residual deficits: Secondary | ICD-10-CM

## 2022-06-08 DIAGNOSIS — J690 Pneumonitis due to inhalation of food and vomit: Secondary | ICD-10-CM | POA: Diagnosis not present

## 2022-06-08 DIAGNOSIS — E785 Hyperlipidemia, unspecified: Secondary | ICD-10-CM | POA: Diagnosis present

## 2022-06-08 DIAGNOSIS — Z803 Family history of malignant neoplasm of breast: Secondary | ICD-10-CM

## 2022-06-08 DIAGNOSIS — Z7982 Long term (current) use of aspirin: Secondary | ICD-10-CM

## 2022-06-08 DIAGNOSIS — Z1152 Encounter for screening for COVID-19: Secondary | ICD-10-CM

## 2022-06-08 DIAGNOSIS — R000 Tachycardia, unspecified: Secondary | ICD-10-CM

## 2022-06-08 DIAGNOSIS — Z79899 Other long term (current) drug therapy: Secondary | ICD-10-CM

## 2022-06-08 DIAGNOSIS — Z800 Family history of malignant neoplasm of digestive organs: Secondary | ICD-10-CM

## 2022-06-08 DIAGNOSIS — R54 Age-related physical debility: Secondary | ICD-10-CM | POA: Diagnosis present

## 2022-06-08 DIAGNOSIS — Z85528 Personal history of other malignant neoplasm of kidney: Secondary | ICD-10-CM

## 2022-06-08 DIAGNOSIS — F0150 Vascular dementia without behavioral disturbance: Secondary | ICD-10-CM | POA: Diagnosis present

## 2022-06-08 DIAGNOSIS — I10 Essential (primary) hypertension: Secondary | ICD-10-CM | POA: Diagnosis present

## 2022-06-08 DIAGNOSIS — E872 Acidosis, unspecified: Secondary | ICD-10-CM | POA: Diagnosis present

## 2022-06-08 DIAGNOSIS — Z96651 Presence of right artificial knee joint: Secondary | ICD-10-CM | POA: Diagnosis present

## 2022-06-08 DIAGNOSIS — R319 Hematuria, unspecified: Secondary | ICD-10-CM

## 2022-06-08 DIAGNOSIS — K529 Noninfective gastroenteritis and colitis, unspecified: Secondary | ICD-10-CM | POA: Diagnosis present

## 2022-06-08 DIAGNOSIS — E860 Dehydration: Secondary | ICD-10-CM | POA: Diagnosis not present

## 2022-06-08 DIAGNOSIS — K6389 Other specified diseases of intestine: Secondary | ICD-10-CM | POA: Diagnosis present

## 2022-06-08 DIAGNOSIS — N400 Enlarged prostate without lower urinary tract symptoms: Secondary | ICD-10-CM | POA: Diagnosis present

## 2022-06-08 DIAGNOSIS — Z66 Do not resuscitate: Secondary | ICD-10-CM | POA: Diagnosis present

## 2022-06-08 DIAGNOSIS — Z8701 Personal history of pneumonia (recurrent): Secondary | ICD-10-CM

## 2022-06-08 DIAGNOSIS — R112 Nausea with vomiting, unspecified: Secondary | ICD-10-CM

## 2022-06-08 DIAGNOSIS — Z8546 Personal history of malignant neoplasm of prostate: Secondary | ICD-10-CM

## 2022-06-08 LAB — RESP PANEL BY RT-PCR (FLU A&B, COVID) ARPGX2
Influenza A by PCR: NEGATIVE
Influenza B by PCR: NEGATIVE
SARS Coronavirus 2 by RT PCR: NEGATIVE

## 2022-06-08 LAB — COMPREHENSIVE METABOLIC PANEL
ALT: 8 U/L (ref 0–44)
AST: 19 U/L (ref 15–41)
Albumin: 3.7 g/dL (ref 3.5–5.0)
Alkaline Phosphatase: 97 U/L (ref 38–126)
Anion gap: 13 (ref 5–15)
BUN: 24 mg/dL — ABNORMAL HIGH (ref 8–23)
CO2: 29 mmol/L (ref 22–32)
Calcium: 10 mg/dL (ref 8.9–10.3)
Chloride: 102 mmol/L (ref 98–111)
Creatinine, Ser: 1.06 mg/dL (ref 0.61–1.24)
GFR, Estimated: >60 mL/min (ref >60)
Glucose, Bld: 165 mg/dL — ABNORMAL HIGH (ref 70–99)
Potassium: 4.7 mmol/L (ref 3.5–5.1)
Sodium: 144 mmol/L (ref 135–145)
Total Bilirubin: 1 mg/dL (ref 0.3–1.2)
Total Protein: 7.8 g/dL (ref 6.5–8.1)

## 2022-06-08 LAB — CBC WITH DIFFERENTIAL/PLATELET
Abs Immature Granulocytes: 0.14 10*3/uL — ABNORMAL HIGH (ref 0.00–0.07)
Basophils Absolute: 0.1 10*3/uL (ref 0.0–0.1)
Basophils Relative: 0 %
Eosinophils Absolute: 0 10*3/uL (ref 0.0–0.5)
Eosinophils Relative: 0 %
HCT: 37 % — ABNORMAL LOW (ref 39.0–52.0)
Hemoglobin: 11.3 g/dL — ABNORMAL LOW (ref 13.0–17.0)
Immature Granulocytes: 1 %
Lymphocytes Relative: 9 %
Lymphs Abs: 1.8 10*3/uL (ref 0.7–4.0)
MCH: 28.1 pg (ref 26.0–34.0)
MCHC: 30.5 g/dL (ref 30.0–36.0)
MCV: 92 fL (ref 80.0–100.0)
Monocytes Absolute: 1.1 10*3/uL — ABNORMAL HIGH (ref 0.1–1.0)
Monocytes Relative: 5 %
Neutro Abs: 17.6 10*3/uL — ABNORMAL HIGH (ref 1.7–7.7)
Neutrophils Relative %: 85 %
Platelets: 480 10*3/uL — ABNORMAL HIGH (ref 150–400)
RBC: 4.02 MIL/uL — ABNORMAL LOW (ref 4.22–5.81)
RDW: 14.5 % (ref 11.5–15.5)
WBC: 20.7 10*3/uL — ABNORMAL HIGH (ref 4.0–10.5)
nRBC: 0 % (ref 0.0–0.2)

## 2022-06-08 LAB — LIPASE, BLOOD: Lipase: 27 U/L (ref 11–51)

## 2022-06-08 LAB — LACTIC ACID, PLASMA
Lactic Acid, Venous: 2.4 mmol/L (ref 0.5–1.9)
Lactic Acid, Venous: 1.7 mmol/L (ref 0.5–1.9)

## 2022-06-08 MED ORDER — LACTATED RINGERS IV BOLUS (SEPSIS)
1000.0000 mL | Freq: Once | INTRAVENOUS | Status: AC
  Administered 2022-06-08: 1000 mL via INTRAVENOUS

## 2022-06-08 MED ORDER — LACTATED RINGERS IV SOLN: INTRAVENOUS | Status: DC

## 2022-06-08 MED ORDER — IOHEXOL 300 MG/ML  SOLN
100.0000 mL | Freq: Once | INTRAMUSCULAR | Status: AC | PRN
  Administered 2022-06-08: 100 mL via INTRAVENOUS

## 2022-06-08 MED ORDER — ONDANSETRON HCL 4 MG/2ML IJ SOLN: 4.0000 mg | Freq: Four times a day (QID) | INTRAMUSCULAR | Status: DC | PRN

## 2022-06-08 MED ORDER — QUETIAPINE FUMARATE 25 MG PO TABS
50.0000 mg | ORAL_TABLET | Freq: Every day | ORAL | Status: DC
  Filled 2022-06-08: qty 2

## 2022-06-08 MED ORDER — SODIUM CHLORIDE 0.9 % IV BOLUS: 1000.0000 mL | Freq: Once | INTRAVENOUS | Status: DC

## 2022-06-08 MED ORDER — ASPIRIN 81 MG PO TBEC
81.0000 mg | DELAYED_RELEASE_TABLET | Freq: Every day | ORAL | Status: DC
  Administered 2022-06-09 – 2022-06-11 (×3): 81 mg via ORAL
  Filled 2022-06-08 (×3): qty 1

## 2022-06-08 MED ORDER — SODIUM CHLORIDE 0.9 % IV SOLN
2.0000 g | Freq: Once | INTRAVENOUS | Status: AC
  Administered 2022-06-08: 2 g via INTRAVENOUS
  Filled 2022-06-08: qty 20

## 2022-06-08 MED ORDER — ONDANSETRON HCL 4 MG/2ML IJ SOLN
4.0000 mg | Freq: Once | INTRAMUSCULAR | Status: AC
  Administered 2022-06-08: 4 mg via INTRAVENOUS
  Filled 2022-06-08: qty 2

## 2022-06-08 MED ORDER — SERTRALINE HCL 25 MG PO TABS: 25.0000 mg | ORAL_TABLET | Freq: Every day | ORAL | Status: DC

## 2022-06-08 MED ORDER — SODIUM CHLORIDE 0.9% FLUSH
3.0000 mL | Freq: Two times a day (BID) | INTRAVENOUS | Status: DC
  Administered 2022-06-09 – 2022-06-11 (×4): 3 mL via INTRAVENOUS

## 2022-06-08 MED ORDER — ACETAMINOPHEN 650 MG RE SUPP: 650.0000 mg | Freq: Four times a day (QID) | RECTAL | Status: DC | PRN

## 2022-06-08 MED ORDER — SODIUM CHLORIDE 0.9 % IV SOLN
2.0000 g | INTRAVENOUS | Status: DC
  Administered 2022-06-09 – 2022-06-10 (×2): 2 g via INTRAVENOUS
  Filled 2022-06-08 (×3): qty 20

## 2022-06-08 MED ORDER — METRONIDAZOLE 500 MG/100ML IV SOLN
500.0000 mg | Freq: Once | INTRAVENOUS | Status: AC
  Administered 2022-06-08: 500 mg via INTRAVENOUS
  Filled 2022-06-08: qty 100

## 2022-06-08 MED ORDER — ACETAMINOPHEN 325 MG PO TABS: 650.0000 mg | ORAL_TABLET | Freq: Four times a day (QID) | ORAL | Status: DC | PRN

## 2022-06-08 MED ORDER — ORAL CARE MOUTH RINSE: 15.0000 mL | OROMUCOSAL | Status: DC | PRN

## 2022-06-08 MED ORDER — POLYETHYLENE GLYCOL 3350 17 G PO PACK: 17.0000 g | PACK | Freq: Every day | ORAL | Status: DC | PRN

## 2022-06-08 NOTE — ED Notes (Signed)
Date and time results received: 06/08/22 1419 (use smartphrase ".now" to insert current time)  Test: Lactate Critical Value: 2.4  Name of Provider Notified: Mumma

## 2022-06-08 NOTE — Assessment & Plan Note (Signed)
Patient's daughter states she noticed several drops of blood coming from the urethral opening today that has occasionally recurred throughout the day.  Patient has a known history of renal cell carcinoma.  He also has a history of BPH.  He is already established with urology.  - Continue outpatient follow-up with urology - Urinalysis pending

## 2022-06-08 NOTE — ED Provider Notes (Signed)
[facility] Provider Note    Event Date/Time   First MD Initiated Contact with Patient 06/08/22 1147     (approximate)   History   Emesis   HPI  Todd Trujillo. is a 88 y.o. male with past medical history significant for hypertension, hyperlipidemia, known colon mass, who presents to the emergency department for not feeling well.  Family provides the history.  States that since Wednesday he has not been doing well with generalized weakness and fatigue.  Cough and congestion.  Endorses vomiting and bloody diarrhea that has been watery.  No recent antibiotic use.  Patient had a similar episode 1 year ago and was found to have a colon mass ultimately patient opted to not have it worked up because he would not want treatment.  Not on anticoagulation.  History of aspiration pneumonia.  No recent falls or trauma.     Physical Exam   Triage Vital Signs: ED Triage Vitals  Enc Vitals Group     BP 06/08/22 1127 96/79     Pulse Rate 06/08/22 1127 (!) 112     Resp 06/08/22 1127 19     Temp 06/08/22 1127 97.7 F (36.5 C)     Temp Source 06/08/22 1127 Axillary     SpO2 06/08/22 1127 93 %     Weight 06/08/22 1123 145 lb (65.8 kg)     Height 06/08/22 1123 '5\' 10"'$  (1.778 m)     Head Circumference --      Peak Flow --      Pain Score --      Pain Loc --      Pain Edu? --      Excl. in [place]? --     Most recent vital signs: Vitals:   06/08/22 1415 06/08/22 1430  BP: (!) 167/98 (!) 175/93  Pulse: (!) 110 (!) 120  Resp: 18 17  Temp:    SpO2: 100% 99%    Physical Exam Constitutional:      General: He is in acute distress.     Appearance: He is well-developed. He is ill-appearing.  HENT:     Head: Atraumatic.  Eyes:     Comments: Watery drainage bilaterally.  No purulent drainage.  Normal conjunctiva  Cardiovascular:     Rate and Rhythm: Regular rhythm. Tachycardia present.  Pulmonary:     Effort: Respiratory distress present.     Breath  sounds: Rhonchi present.  Abdominal:     Palpations: Abdomen is soft.     Tenderness: There is abdominal tenderness (Diffuse).  Musculoskeletal:        General: Normal range of motion.     Cervical back: Normal range of motion.  Skin:    General: Skin is warm.     Capillary Refill: Capillary refill takes more than 3 seconds.  Neurological:     Mental Status: He is alert. Mental status is at baseline.  Psychiatric:     Comments: Withdrawn          IMPRESSION / MDM / ASSESSMENT AND PLAN / ED COURSE  I reviewed the triage vital signs and the nursing notes.  88year-old male presents to the emergency department with nausea vomiting and diarrhea.  Differential diagnosis including sepsis, C. difficile, viral gastroenteritis, urinary tract infection, COVID/pneumonia, AVM, malignancy, SBO  Patient meets criteria for sepsis.  Blood cultures obtained.  Significant tachycardia.  No hypotension.  Started on 30 cc/kg of IV fluids while initial lactic acid was pending.  Started on antibiotics to cover for intra-abdominal pathology with cefepime and Flagyl.  EKG  I, Nathaniel Man, the attending physician, personally viewed and interpreted this ECG.   Rate: Tachycardia, 148  Rhythm: Normal sinus rhythm versus SVT, difficult to discern P waves.  Axis: Normal  Intervals: Normal  ST&T Change: Nonspecific ST depression Sinus tachycardia with heart rates up to 150s while on cardiac telemetry  RADIOLOGY I independently reviewed imaging, my interpretation of imaging: Chest x-ray without focal findings consistent with pneumonia.  CT abdomen and pelvis obtained.  CT scan without signs of a small bowel obstruction.  No obvious signs of an abscess.  CT scan read demonstrated renal and colon mass.  Showed signs of colitis which was nonspecific.  Signs of aspiration to lower lungs.    ED Results / Procedures / Treatments   Labs (all labs ordered are listed, but only abnormal results are  displayed) Labs interpreted as -    Labs Reviewed  CBC WITH DIFFERENTIAL/PLATELET - Abnormal; Notable for the following components:      Result Value   WBC 20.7 (*)    RBC 4.02 (*)    Hemoglobin 11.3 (*)    HCT 37.0 (*)    Platelets 480 (*)    Neutro Abs 17.6 (*)    Monocytes Absolute 1.1 (*)    Abs Immature Granulocytes 0.14 (*)    All other components within normal limits  COMPREHENSIVE METABOLIC PANEL - Abnormal; Notable for the following components:   Glucose, Bld 165 (*)    BUN 24 (*)    All other components within normal limits  LACTIC ACID, PLASMA - Abnormal; Notable for the following components:   Lactic Acid, Venous 2.4 (*)    All other components within normal limits  RESP PANEL BY RT-PCR (FLU A&B, COVID) ARPGX2  CULTURE, BLOOD (ROUTINE X 2)  CULTURE, BLOOD (ROUTINE X 2)  URINE CULTURE  GASTROINTESTINAL PANEL BY PCR, STOOL (REPLACES STOOL CULTURE)  C DIFFICILE QUICK SCREEN W PCR REFLEX    LIPASE, BLOOD  URINALYSIS, ROUTINE W REFLEX MICROSCOPIC  LACTIC ACID, PLASMA  URINALYSIS, COMPLETE (UACMP) WITH MICROSCOPIC    3:32 PM Sepsis reevaluation On reevaluation patient had improvement of his tachycardia with IV fluids.  Improved perfusion do not feel that further IV fluids are necessary at this time.   PROCEDURES:  Critical Care performed: yes  .Critical Care  Performed by: Nathaniel Man, MD Authorized by: Nathaniel Man, MD   Critical care provider statement:    Critical care time (minutes):  50   Critical care time was exclusive of:  Separately billable procedures and treating other patients   Critical care was necessary to treat or prevent imminent or life-threatening deterioration of the following conditions:  Sepsis   Critical care was time spent personally by me on the following activities:  Development of treatment plan with patient or surrogate, discussions with consultants, evaluation of patient's response to treatment, examination of patient,  ordering and review of laboratory studies, ordering and review of radiographic studies, ordering and performing treatments and interventions, pulse oximetry, re-evaluation of patient's condition and review of old charts   Patient's presentation is most consistent with acute presentation with potential threat to life or bodily function.   MEDICATIONS ORDERED IN ED: Medications  lactated ringers infusion (has no administration in time range)  ondansetron (ZOFRAN) injection 4 mg (4 mg Intravenous Given 06/08/22 1358)  lactated ringers bolus 1,000 mL (0 mLs Intravenous Stopped 06/08/22 1436)    And  lactated ringers bolus 1,000 mL (1,000 mLs Intravenous New Bag/Given 06/08/22 1406)  cefTRIAXone (ROCEPHIN) 2 g in sodium chloride 0.9 % 100 mL IVPB (0 g Intravenous Stopped 06/08/22 1436)  metroNIDAZOLE (FLAGYL) IVPB 500 mg (500 mg Intravenous New Bag/Given 06/08/22 1406)  iohexol (OMNIPAQUE) 300 MG/ML solution 100 mL (100 mLs Intravenous Contrast Given 06/08/22 1454)    FINAL CLINICAL IMPRESSION(S) / ED DIAGNOSES   Final diagnoses:  Nausea and vomiting, unspecified vomiting type  Dehydration  Tachycardia     Rx / DC Orders   ED Discharge Orders     None        Note:  This document was prepared using Dragon voice recognition software and may include unintentional dictation errors.   Nathaniel Man, MD 06/08/22 [phone]

## 2022-06-08 NOTE — Sepsis Progress Note (Signed)
Sepsis protocol monitored by eLink 

## 2022-06-08 NOTE — ED Provider Triage Note (Signed)
Emergency Medicine Provider Triage Evaluation Note  Todd Trujillo. , a 88 y.o. male  was evaluated in triage.  Pt complains of vomiting on Wednesday, diarrhea beginning Thursday, and hematuria beginning Thursday. Family reports chest sounds "gurgly" since vomiting. No fever. No blood in stool.   Review of Systems  Positive: N/v/d Negative: fever  Physical Exam  There were no vitals taken for this visit. Gen:   Awake, no distress   Resp:  Normal effort  MSK:   Moves extremities without difficulty  Other:    Medical Decision Making  Medically screening exam initiated at 11:21 AM.  Appropriate orders placed.  Todd Trujillo. was informed that the remainder of the evaluation will be completed by another provider, this initial triage assessment does not replace that evaluation, and the importance of remaining in the ED until their evaluation is complete.     Marquette Old, PA-C 06/08/22 1125

## 2022-06-08 NOTE — Assessment & Plan Note (Addendum)
Patient has a prior history of aspiration, now experiencing hypoxia in the setting of bibasilar opacities concerning for aspiration pneumonia.  - Continue supplemental oxygen to maintain oxygen saturation above 88% - Wean as tolerated - N.p.o. - SLP evaluation - Continue ceftriaxone

## 2022-06-08 NOTE — Code Documentation (Signed)
CODE SEPSIS - PHARMACY COMMUNICATION  **Broad Spectrum Antibiotics should be administered within 1 hour of Sepsis diagnosis**  Time Code Sepsis Called/Page Received: 1318  Antibiotics Ordered: ceftriaxone, flagyl  Time of 1st antibiotic administration: [place] Molly Savarino ,PharmD Clinical Pharmacist  06/08/2022  1:49 PM

## 2022-06-08 NOTE — ED Notes (Signed)
Pt in bed, family at bedside, swabs given to moisten pt's mouth, pt remains on monitor, pt awaits admission

## 2022-06-08 NOTE — ED Notes (Signed)
ED TO INPATIENT HANDOFF REPORT  ED Nurse Name and Phone #: Salvatore Decent Name/Age/Gender Todd Trujillo. 88 y.o. male Room/Bed: ED05A/ED05A  Code Status   Code Status: Prior  Home/SNF/Other Home Patient oriented to: self Is this baseline? No   Triage Complete: Triage complete  Chief Complaint Acute colitis [K52.9]  Triage Note First Nurse: Pt here via [facility] with N/V and diarrhea x 2 days. Pt struggles to swallow and tends to aspirate when he swallows.  102 122/64 96% RA    Wednesday had emeses, Thursday diarrhea bloody urine and vomiting. Family states they feel he sounds grugly. PT has a history of a colon mass. Pt also drank water and chocked so they believe him to be aspirating.    Allergies Allergies  Allergen Reactions   Codeine Nausea And Vomiting and Nausea Only    Other reaction(s): Vomiting    Level of Care/Admitting Diagnosis ED Disposition     ED Disposition  Admit   Condition  --   Comment  Hospital Area: [place] [[id number]]  Level of Care: Telemetry Medical [104]  Covid Evaluation: Confirmed COVID Negative  Diagnosis: Acute colitis [381829]  Admitting Physician: Jose Persia [id number]  Attending Physician: Jose Persia [id number]          B Medical/Surgery History Past Medical History:  Diagnosis Date   Arthritis    Cancer ([place])    prostate   Hyperlipidemia    Hypertension    Left wrist fracture    Macular degeneration of right eye    Nephrolithiasis    Prostate CA ([place]) 2003   Prostate troubles    Patient was unsure of term   Squamous cell carcinoma of skin 01/23/2017   R distal lat bicep near elbow   Squamous cell carcinoma of skin 03/05/2022   L forearm posterior - tx with ED&C   Past Surgical History:  Procedure Laterality Date   INTRAMEDULLARY (IM) NAIL INTERTROCHANTERIC Right 06/07/2021   Procedure: INTRAMEDULLARY (IM) NAIL INTERTROCHANTRIC;  Surgeon: Thornton Park, MD;   Location: [facility] ORS;  Service: Orthopedics;  Laterality: Right;   TOTAL KNEE ARTHROPLASTY Right      A IV Location/Drains/Wounds Patient Lines/Drains/Airways Status     Active Line/Drains/Airways     Name Placement date Placement time Site Days   Peripheral IV 06/08/22 20 G Left Antecubital 06/08/22  1339  Antecubital  less than 1   Peripheral IV 06/08/22 20 G Anterior;Right Antecubital 06/08/22  1358  Antecubital  less than 1   External Urinary Catheter --  --  --  --   Incision (Closed) 06/07/21 Hip Right 06/07/21  1401  -- 366   Incision (Closed) 06/07/21 Thigh Right;Lateral 06/07/21  1418  -- 366   Incision (Closed) Thigh Right;Lateral;Lower --  --  -- --            Intake/Output Last 24 hours  Intake/Output Summary (Last 24 hours) at 06/08/2022 1659 Last data filed at 06/08/2022 1622 Gross per 24 hour  Intake 2197.01 ml  Output --  Net 2197.01 ml    Labs/Imaging Results for orders placed or performed during the hospital encounter of 06/08/22 (from the past 48 hour(s))  CBC with Differential     Status: Abnormal   Collection Time: 06/08/22 11:23 AM  Result Value Ref Range   WBC 20.7 (H) 4.0 - 10.5 K/uL   RBC 4.02 (L) 4.22 - 5.81 MIL/uL   Hemoglobin 11.3 (L) 13.0 - 17.0 g/dL   HCT 37.0 (  L) 39.0 - 52.0 %   MCV 92.0 80.0 - 100.0 fL   MCH 28.1 26.0 - 34.0 pg   MCHC 30.5 30.0 - 36.0 g/dL   RDW 14.5 11.5 - 15.5 %   Platelets 480 (H) 150 - 400 K/uL   nRBC 0.0 0.0 - 0.2 %   Neutrophils Relative % 85 %   Neutro Abs 17.6 (H) 1.7 - 7.7 K/uL   Lymphocytes Relative 9 %   Lymphs Abs 1.8 0.7 - 4.0 K/uL   Monocytes Relative 5 %   Monocytes Absolute 1.1 (H) 0.1 - 1.0 K/uL   Eosinophils Relative 0 %   Eosinophils Absolute 0.0 0.0 - 0.5 K/uL   Basophils Relative 0 %   Basophils Absolute 0.1 0.0 - 0.1 K/uL   Immature Granulocytes 1 %   Abs Immature Granulocytes 0.14 (H) 0.00 - 0.07 K/uL    Comment: Performed at [facility], [address] [place], [place]  [address]  Comprehensive metabolic panel     Status: Abnormal   Collection Time: 06/08/22 11:23 AM  Result Value Ref Range   Sodium 144 135 - 145 mmol/L   Potassium 4.7 3.5 - 5.1 mmol/L   Chloride 102 98 - 111 mmol/L   CO2 29 22 - 32 mmol/L   Glucose, Bld 165 (H) 70 - 99 mg/dL    Comment: Glucose reference range applies only to samples taken after fasting for at least 8 hours.   BUN 24 (H) 8 - 23 mg/dL   Creatinine, Ser 1.06 0.61 - 1.24 mg/dL   Calcium 10.0 8.9 - 10.3 mg/dL   Total Protein 7.8 6.5 - 8.1 g/dL   Albumin 3.7 3.5 - 5.0 g/dL   AST 19 15 - 41 U/L   ALT 8 0 - 44 U/L   Alkaline Phosphatase 97 38 - 126 U/L   Total Bilirubin 1.0 0.3 - 1.2 mg/dL   GFR, Estimated >60 >60 mL/min    Comment: (NOTE) Calculated using the CKD-EPI Creatinine Equation (2021)    Anion gap 13 5 - 15    Comment: Performed at [facility], [address] [place], [place] [address]  Lipase, blood     Status: None   Collection Time: 06/08/22 11:23 AM  Result Value Ref Range   Lipase 27 11 - 51 U/L    Comment: Performed at [facility], [address] [place], [place] [address]  Resp Panel by RT-PCR (Flu A&B, Covid) Anterior Nasal Swab     Status: None   Collection Time: 06/08/22  1:52 PM   Specimen: Anterior Nasal Swab  Result Value Ref Range   SARS Coronavirus 2 by RT PCR NEGATIVE NEGATIVE    Comment: (NOTE) SARS-CoV-2 target nucleic acids are NOT DETECTED.  The SARS-CoV-2 RNA is generally detectable in upper respiratory specimens during the acute phase of infection. The lowest concentration of SARS-CoV-2 viral copies this assay can detect is 138 copies/mL. A negative result does not preclude SARS-Cov-2 infection and should not be used as the sole basis for treatment or other patient management decisions. A negative result may occur with  improper specimen collection/handling, submission of specimen other than nasopharyngeal swab, presence of viral mutation(s) within the areas  targeted by this assay, and inadequate number of viral copies(<138 copies/mL). A negative result must be combined with clinical observations, patient history, and epidemiological information. The expected result is Negative.  Fact Sheet for Patients:  EntrepreneurPulse.com.au  Fact Sheet for Healthcare Providers:  IncredibleEmployment.be  This  test is no t yet approved or cleared by the [place] and  has been authorized for detection and/or diagnosis of SARS-CoV-2 by FDA under an Emergency Use Authorization (EUA). This EUA will remain  in effect (meaning this test can be used) for the duration of the COVID-19 declaration under Section 564(b)(1) of the Act, 21 U.S.C.section 360bbb-3(b)(1), unless the authorization is terminated  or revoked sooner.       Influenza A by PCR NEGATIVE NEGATIVE   Influenza B by PCR NEGATIVE NEGATIVE    Comment: (NOTE) The Xpert Xpress SARS-CoV-2/FLU/RSV plus assay is intended as an aid in the diagnosis of influenza from Nasopharyngeal swab specimens and should not be used as a sole basis for treatment. Nasal washings and aspirates are unacceptable for Xpert Xpress SARS-CoV-2/FLU/RSV testing.  Fact Sheet for Patients: EntrepreneurPulse.com.au  Fact Sheet for Healthcare Providers: IncredibleEmployment.be  This test is not yet approved or cleared by the [place] FDA and has been authorized for detection and/or diagnosis of SARS-CoV-2 by FDA under an Emergency Use Authorization (EUA). This EUA will remain in effect (meaning this test can be used) for the duration of the COVID-19 declaration under Section 564(b)(1) of the Act, 21 U.S.C. section 360bbb-3(b)(1), unless the authorization is terminated or revoked.  Performed at [facility], [address] [place], [place] [address]   Lactic acid, plasma     Status: Abnormal   Collection Time: 06/08/22   1:52 PM  Result Value Ref Range   Lactic Acid, Venous 2.4 (HH) 0.5 - 1.9 mmol/L    Comment: CRITICAL RESULT CALLED TO, READ BACK BY AND VERIFIED WITH Shlomo Seres 06/08/22 @ 1420 BY SB Performed at [facility], [address] [place], [place] [address]   Lactic acid, plasma     Status: None   Collection Time: 06/08/22  4:24 PM  Result Value Ref Range   Lactic Acid, Venous 1.7 0.5 - 1.9 mmol/L    Comment: Performed at [facility], [address] [place], [place] [address]   CT Abdomen Pelvis W Contrast  Result Date: 06/08/2022 CLINICAL DATA:  Abdominal pain, nausea, vomiting and diarrhea, aspiration * Tracking Code: BO * EXAM: CT ABDOMEN AND PELVIS WITH CONTRAST TECHNIQUE: Multidetector CT imaging of the abdomen and pelvis was performed using the standard protocol following bolus administration of intravenous contrast. RADIATION DOSE REDUCTION: This exam was performed according to the departmental dose-optimization program which includes automated exposure control, adjustment of the mA and/or kV according to patient size and/or use of iterative reconstruction technique. CONTRAST:  113m OMNIPAQUE IOHEXOL 300 MG/ML  SOLN COMPARISON:  10/03/2021 FINDINGS: Lower chest: Coronary artery calcifications. Heterogeneous opacity, consolidation, and bronchiolar plugging in the left-greater-than-right bilateral lung bases (series 4, image 8). Trace pleural effusions. Hepatobiliary: No solid liver abnormality is seen. No gallstones, gallbladder wall thickening, or biliary dilatation. Pancreas: Atrophic appearing pancreas. No pancreatic ductal dilatation or surrounding inflammatory changes. Spleen: Normal in size without significant abnormality. Adrenals/Urinary Tract: Adrenal glands are unremarkable. Unchanged mixed solid and cystic exophytic lesion arising from the superior pole of the left kidney, measuring 3.9 x 3.8 cm, again with a peripheral nodule anteriorly which apparently  enhances, measuring 2.1 x 1.5 cm (series 2, image 20). Bladder is unremarkable. Stomach/Bowel: Stomach is within normal limits. Similar appearance of circumferential wall thickening involving the mid ascending colon (series 2, image 34). There is new associated long segment wall thickening involving the remainder of the cecum and ascending colon on today's examination (series  2, image 41, series 5, image 34). Normal appendix. Sigmoid diverticula. Vascular/Lymphatic: Aortic atherosclerosis. No enlarged abdominal or pelvic lymph nodes. Reproductive: Prostate brachytherapy. Median lobe hypertrophy (series 6, image 69). Other: No abdominal wall hernia or abnormality. No ascites. Musculoskeletal: No acute osseous findings. Status post intramedullary nail fixation of the proximal right femur. Unchanged wedge deformities of L1 and L3. IMPRESSION: 1. Heterogeneous opacity, consolidation, and bronchiolar plugging in the left-greater-than-right bilateral lung bases, consistent with reported history of aspiration. Trace pleural effusions. 2. Similar appearance of circumferential wall thickening involving the mid ascending colon, highly suspicious for primary colon malignancy. 3. New associated long segment wall thickening involving the remainder of the cecum and ascending colon on today's examination, suggesting nonspecific infectious or inflammatory colitis. 4. Unchanged mixed solid and cystic exophytic lesion arising from the superior pole of the left kidney, remaining highly suspicious for a cystic renal cell carcinoma. 5. No evidence of lymphadenopathy or metastatic disease in the abdomen or pelvis. 6. Coronary artery disease. 7. Prostate brachytherapy. Aortic Atherosclerosis (ICD10-I70.0). Electronically Signed   By: Delanna Ahmadi M.D.   On: 06/08/2022 15:22   DG Chest 2 View  Result Date: 06/08/2022 CLINICAL DATA:  88 year old male with question of aspiration. EXAM: CHEST - 2 VIEW COMPARISON:  June 06, 2021.  FINDINGS: AP projection rotated to the LEFT. Accounting for this cardiomediastinal contours and hilar structures are stable. There is no sign of lobar consolidative change. Mild prominence of interstitium at the LEFT and RIGHT lung base. No pneumothorax. Potential trace pleural effusions. Lateral projection limited by patient condition. No acute or destructive bone findings on limited assessment. IMPRESSION: Mild prominence of interstitium at the LEFT and RIGHT lung base. Findings may be related to chronic changes from prior aspiration and are much improved compared to more remote imaging. Attention on follow-up is suggested if there is continued concern for a changes related to acute aspiration event. Potential trace bilateral pleural effusions. Electronically Signed   By: Zetta Bills M.D.   On: 06/08/2022 12:10    Pending Labs Unresulted Labs (From admission, onward)     Start     Ordered   06/08/22 1452  Gastrointestinal Panel by PCR , Stool  (Gastrointestinal Panel by PCR, Stool                                                                                                                                                     **Does Not include CLOSTRIDIUM DIFFICILE testing. **If CDIFF testing is needed, place order from the "C Difficile Testing" order set.**)  Once,   URGENT        06/08/22 1451   06/08/22 1452  C Difficile Quick Screen w PCR reflex  (C Difficile quick screen w PCR reflex panel )  Once, for 24 hours,   URGENT  References:    CDiff Information Tool   06/08/22 1451   06/08/22 1317  Blood Culture (routine x 2)  (Septic presentation on arrival (screening labs, nursing and treatment orders for obvious sepsis))  BLOOD CULTURE X 2,   STAT      06/08/22 1318   06/08/22 1317  Urinalysis, Complete w Microscopic  (Septic presentation on arrival (screening labs, nursing and treatment orders for obvious sepsis))  ONCE - URGENT,   URGENT        06/08/22 1318   06/08/22 1317  Urine  Culture  (Septic presentation on arrival (screening labs, nursing and treatment orders for obvious sepsis))  ONCE - URGENT,   URGENT       Question:  Indication  Answer:  Sepsis   06/08/22 1318   06/08/22 1123  Urinalysis, Routine w reflex microscopic  Once,   URGENT        06/08/22 1124            Vitals/Pain Today's Vitals   06/08/22 1405 06/08/22 1415 06/08/22 1430 06/08/22 1626  BP: (!) 165/91 (!) 167/98 (!) 175/93 119/73  Pulse: (!) 109 (!) 110 (!) 120 (!) 113  Resp: (!) '23 18 17 20  '$ Temp:    98.5 F (36.9 C)  TempSrc:    Oral  SpO2: 99% 100% 99% 98%  Weight:      Height:        Isolation Precautions Enteric precautions (UV disinfection)  Medications Medications  lactated ringers infusion ( Intravenous New Bag/Given 06/08/22 1626)  ondansetron (ZOFRAN) injection 4 mg (4 mg Intravenous Given 06/08/22 1358)  lactated ringers bolus 1,000 mL (0 mLs Intravenous Stopped 06/08/22 1436)    And  lactated ringers bolus 1,000 mL (0 mLs Intravenous Stopped 06/08/22 1621)  cefTRIAXone (ROCEPHIN) 2 g in sodium chloride 0.9 % 100 mL IVPB (0 g Intravenous Stopped 06/08/22 1436)  metroNIDAZOLE (FLAGYL) IVPB 500 mg (0 mg Intravenous Stopped 06/08/22 1622)  iohexol (OMNIPAQUE) 300 MG/ML solution 100 mL (100 mLs Intravenous Contrast Given 06/08/22 1454)    Mobility walks with device High fall risk      R Recommendations: See Admitting Provider Note  Report given to:   Additional Notes:

## 2022-06-08 NOTE — Assessment & Plan Note (Signed)
Patient's mentation is currently at baseline per his daughter at bedside, although he is typically more vocal.  Patient's wife states that his mentation fluctuates.

## 2022-06-08 NOTE — H&P (Signed)
History and Physical    Todd Trujillo: Todd Trujillo. PNT:[id number] DOB: 23-Jul-1929 DOA: 06/08/2022 DOS: the Todd Trujillo was seen and examined on 06/08/2022 PCP: Einar Pheasant, MD  Todd Trujillo coming from: Home  Chief Complaint:  Chief Complaint  Todd Trujillo presents with   Emesis   HPI: Todd Trujillo. is a 88 y.o. male with medical history significant of vascular dementia, CVA, hypertension, hyperlipidemia, recurrent aspiration pneumonia, iron deficiency anemia, suspected right colon malignancy, suspected renal cell carcinoma, who presents to the ED with complaints of vomiting and bloody diarrhea. History obtained from Todd Trujillo's daughter Amy at bedside due to Todd Trujillo's inability to speak (throat pain)and dementia.   Amy states that on 11/22, Todd Trujillo coughed up brown sputum but seemed otherwise at his baseline at that time. Todd Trujillo subsequently developed nausea with vomiting that persisted through out the day and at night.  Emesis was brown and occasionally dark but no black coffee grounds noted.  She states one of her sisters may have seen some bright red in the emesis but she is unsure.  In the evening of 11/23 into today, Todd Trujillo began to experience dark brown diarrhea that was "explosive".  Amy denies any hematochezia or melena.  She denies any known fevers, chills, chest pain, shortness of breath.  She did notice some bright red blood coming from his urethral opening but states it has only been a couple drops at a time.  She has not noticed any difficulty with urination.  No recent antibiotic use.  No recent medication changes.  ED course: On arrival to the ED, Todd Trujillo was normotensive at 96/79 with heart rate of 112.  Todd Trujillo was saturating at 93% on room air but subsequently desatted to 88% and was placed on 2 L nasal cannula.  Initial workup remarkable for elevated WBC at 20.7, hemoglobin of 11.3, and platelets of 480.  COVID-19 and influenza PCR negative. CT abdomen/pelvis was  obtained that demonstrated new associated long segment of wall thickening involving the cecum and ascending colon next to the previously present circumferential wall thickening suspected to be primary colon malignancy.  In addition, bilateral lung opacities consistent with aspiration present.  TRH contacted for admission.  Review of Systems: As mentioned in the history of present illness. All other systems reviewed and are negative.  Past Medical History:  Diagnosis Date   Arthritis    Cancer [facility])    prostate   Hyperlipidemia    Hypertension    Left wrist fracture    Macular degeneration of right eye    Nephrolithiasis    Prostate CA ([facility]ex) 2003   Prostate troubles    Todd Trujillo was unsure of term   Squamous cell carcinoma of skin 01/23/2017   R distal lat bicep near elbow   Squamous cell carcinoma of skin 03/05/2022   L forearm posterior - tx with ED&C   Past Surgical History:  Procedure Laterality Date   INTRAMEDULLARY (IM) NAIL INTERTROCHANTERIC Right 06/07/2021   Procedure: INTRAMEDULLARY (IM) NAIL INTERTROCHANTRIC;  Surgeon: Thornton Park, MD;  Location: [facility] ORS;  Service: Orthopedics;  Laterality: Right;   TOTAL KNEE ARTHROPLASTY Right    Social History:  reports that Todd Trujillo has never smoked. Todd Trujillo has never been exposed to tobacco smoke. Todd Trujillo has never used smokeless tobacco. Todd Trujillo reports that Todd Trujillo does not currently use drugs. Todd Trujillo reports that Todd Trujillo does not drink alcohol.  Allergies  Allergen Reactions   Codeine Nausea And Vomiting and Nausea Only    Other reaction(s): Vomiting  Family History  Problem Relation Age of Onset   Colon cancer Mother    Esophageal cancer Brother    Breast cancer Daughter     Prior to Admission medications   Medication Sig Start Date End Date Taking? Authorizing Provider  aspirin EC 81 MG tablet Take 1 tablet (81 mg total) by mouth daily. Swallow whole. 08/19/21  Yes Einar Pheasant, MD  sertraline (ZOLOFT) 25 MG tablet TAKE 1 TABLET BY MOUTH DAILY  05/07/22  Yes Einar Pheasant, MD  acetaminophen (TYLENOL) 500 MG tablet Take 500 mg by mouth every 6 (six) hours as needed.    [provider]  cholecalciferol (VITAMIN D3) 25 MCG (1000 UNIT) tablet Take 1,000 Units by mouth daily.    [provider]  ciprofloxacin (CILOXAN) 0.3 % ophthalmic solution PLACE 1 DROP INTO BOTH EYES EVERY 6 HOURS 04/20/22   Einar Pheasant, MD  Nutritional Supplements (FEEDING SUPPLEMENT, NEPRO CARB STEADY,) LIQD Take 237 mLs by mouth 2 (two) times daily between meals. 06/15/21   Sharen Hones, MD  omeprazole (PRILOSEC) 40 MG capsule Take 40 mg by mouth daily. Todd Trujillo not taking: Reported on 04/11/2022 07/18/21   [provider]  QUEtiapine (SEROQUEL) 50 MG tablet TAKE 1 TABLET BY MOUTH AT BEDTIME. 03/28/22   Einar Pheasant, MD    Physical Exam: Vitals:   06/08/22 1830 06/08/22 1835 06/08/22 1839 06/08/22 1936  BP: (!) 140/71  138/69 134/69  Pulse: (!) 103  (!) 108 (!) 101  Resp:  (!) 24 (!) 24 20  Temp:   98.2 F (36.8 C) 98.3 F (36.8 C)  TempSrc:   Oral Oral  SpO2: 96%  97% 97%  Weight:      Height:       Physical Exam Vitals and nursing note reviewed.  Constitutional:      General: Todd Trujillo is not in acute distress.    Appearance: Todd Trujillo is normal weight. Todd Trujillo is not toxic-appearing.  HENT:     Head: Normocephalic and atraumatic.     Mouth/Throat:     Pharynx: Oropharynx is clear.     Comments: Oropharynx extremely dry. Eyes:     Conjunctiva/sclera: Conjunctivae normal.     Pupils: Pupils are equal, round, and reactive to light.  Cardiovascular:     Rate and Rhythm: Regular rhythm. Tachycardia present.     Heart sounds: No murmur heard. Pulmonary:     Effort: Pulmonary effort is normal. Tachypnea present.     Breath sounds: Rhonchi (Bibasilar) present. No wheezing or rales.  Abdominal:     General: Bowel sounds are decreased.     Palpations: Abdomen is soft.     Tenderness: There is no abdominal tenderness. There is no guarding.   Musculoskeletal:     Right lower leg: No edema.     Left lower leg: No edema.  Skin:    General: Skin is warm and dry.  Neurological:     Mental Status: Todd Trujillo is alert.     Comments: Todd Trujillo unable to speak in full sentences due to very dry oropharynx.  Todd Trujillo is able to answer questions with yes or no.  Todd Trujillo is oriented to self but not situation.  Able to follow instructions intermittently.  Moving all extremities.    Data Reviewed: CBC with WBC of 20.7, hemoglobin 11.3, platelets of 480.  Neutrophil predominant.  CMP with glucose of 165 and BUN of 24, creatinine 1.06 with no other electrolyte abnormalities noted.  Lipase within normal limits.  Initial lactic acid elevated  at 2.4.  COVID-19 PCR, influenza PCR negative.  EKG personally reviewed.  Narrow complex tachycardia with regular rhythm, SVT versus sinus tachycardia.  No ST elevation or depression to suggest ischemia.  CT Abdomen Pelvis W Contrast  Result Date: 06/08/2022 CLINICAL DATA:  Abdominal pain, nausea, vomiting and diarrhea, aspiration * Tracking Code: BO * EXAM: CT ABDOMEN AND PELVIS WITH CONTRAST TECHNIQUE: Multidetector CT imaging of the abdomen and pelvis was performed using the standard protocol following bolus administration of intravenous contrast. RADIATION DOSE REDUCTION: This exam was performed according to the departmental dose-optimization program which includes automated exposure control, adjustment of the mA and/or kV according to Todd Trujillo size and/or use of iterative reconstruction technique. CONTRAST:  183m OMNIPAQUE IOHEXOL 300 MG/ML  SOLN COMPARISON:  10/03/2021 FINDINGS: Lower chest: Coronary artery calcifications. Heterogeneous opacity, consolidation, and bronchiolar plugging in the left-greater-than-right bilateral lung bases (series 4, image 8). Trace pleural effusions. Hepatobiliary: No solid liver abnormality is seen. No gallstones, gallbladder wall thickening, or biliary dilatation. Pancreas: Atrophic appearing  pancreas. No pancreatic ductal dilatation or surrounding inflammatory changes. Spleen: Normal in size without significant abnormality. Adrenals/Urinary Tract: Adrenal glands are unremarkable. Unchanged mixed solid and cystic exophytic lesion arising from the superior pole of the left kidney, measuring 3.9 x 3.8 cm, again with a peripheral nodule anteriorly which apparently enhances, measuring 2.1 x 1.5 cm (series 2, image 20). Bladder is unremarkable. Stomach/Bowel: Stomach is within normal limits. Similar appearance of circumferential wall thickening involving the mid ascending colon (series 2, image 34). There is new associated long segment wall thickening involving the remainder of the cecum and ascending colon on today's examination (series 2, image 41, series 5, image 34). Normal appendix. Sigmoid diverticula. Vascular/Lymphatic: Aortic atherosclerosis. No enlarged abdominal or pelvic lymph nodes. Reproductive: Prostate brachytherapy. Median lobe hypertrophy (series 6, image 69). Other: No abdominal wall hernia or abnormality. No ascites. Musculoskeletal: No acute osseous findings. Status post intramedullary nail fixation of the proximal right femur. Unchanged wedge deformities of L1 and L3. IMPRESSION: 1. Heterogeneous opacity, consolidation, and bronchiolar plugging in the left-greater-than-right bilateral lung bases, consistent with reported history of aspiration. Trace pleural effusions. 2. Similar appearance of circumferential wall thickening involving the mid ascending colon, highly suspicious for primary colon malignancy. 3. New associated long segment wall thickening involving the remainder of the cecum and ascending colon on today's examination, suggesting nonspecific infectious or inflammatory colitis. 4. Unchanged mixed solid and cystic exophytic lesion arising from the superior pole of the left kidney, remaining highly suspicious for a cystic renal cell carcinoma. 5. No evidence of lymphadenopathy  or metastatic disease in the abdomen or pelvis. 6. Coronary artery disease. 7. Prostate brachytherapy. Aortic Atherosclerosis (ICD10-I70.0). Electronically Signed   By: ADelanna AhmadiM.D.   On: 06/08/2022 15:22   DG Chest 2 View  Result Date: 06/08/2022 CLINICAL DATA:  88year old male with question of aspiration. EXAM: CHEST - 2 VIEW COMPARISON:  June 06, 2021. FINDINGS: AP projection rotated to the LEFT. Accounting for this cardiomediastinal contours and hilar structures are stable. There is no sign of lobar consolidative change. Mild prominence of interstitium at the LEFT and RIGHT lung base. No pneumothorax. Potential trace pleural effusions. Lateral projection limited by Todd Trujillo condition. No acute or destructive bone findings on limited assessment. IMPRESSION: Mild prominence of interstitium at the LEFT and RIGHT lung base. Findings may be related to chronic changes from prior aspiration and are much improved compared to more remote imaging. Attention on follow-up is suggested if there is continued concern  for a changes related to acute aspiration event. Potential trace bilateral pleural effusions. Electronically Signed   By: Zetta Bills M.D.   On: 06/08/2022 12:10    Results are pending, will review when available.  Assessment and Plan: * Acute colitis Todd Trujillo presenting with 36-hour history of vomiting and diarrhea, although vomiting seems predominant.  Initially there was concern for melena, however emesis and stool described as brown.  CT abdomen/pelvis demonstrates right-sided colitis involving the ascending colon and cecum.  This is directly adjacent to the known right colon mass suspected to be malignancy that Todd Trujillo has declined workup for.  No evidence of obstruction.  Differential includes infectious colitis versus possibly ischemic, potentially secondary to adjacent colon mass.  Discussed with GI, who recommends testing for infectious etiology at this time and if results are  negative, they will evaluate Todd Trujillo tomorrow.  - GI consulted; appreciate the recommendations - C. difficile and GI panel pending - Hold further antimicrobials until results return - Zofran as needed for nausea - IV fluid resuscitation  Aspiration pneumonia ([place]) Todd Trujillo has a prior history of aspiration, now experiencing hypoxia in the setting of bibasilar opacities concerning for aspiration pneumonia.  - Continue supplemental oxygen to maintain oxygen saturation above 88% - Wean as tolerated - N.p.o. - SLP evaluation - Continue ceftriaxone  Colonic mass Colonic mass discovered approximately 1 year ago and Todd Trujillo has followed up with gastroenterology, general surgery and oncology.  Due to advancing dementia and frail state, workup has been declined.  Dementia without behavioral disturbance (HCC) Todd Trujillo's mentation is currently at baseline per his daughter at bedside, although Todd Trujillo is typically more vocal.  Todd Trujillo's wife states that his mentation fluctuates.  Hematuria Todd Trujillo's daughter states she noticed several drops of blood coming from the urethral opening today that has occasionally recurred throughout the day.  Todd Trujillo has a known history of renal cell carcinoma.  Todd Trujillo also has a history of BPH.  Todd Trujillo is already established with urology.  - Continue outpatient follow-up with urology - Urinalysis pending  Advance Care Planning:   Code Status: DNR discussed CODE STATUS with Todd Trujillo's daughters Amy and Secundino Ginger, in addition to Todd Trujillo's wife Mrs. Sharlett Iles.  Secundino Ginger states that they have been working on getting a healthcare power of attorney formally assigned, however the paperwork has not been completed.  I then contacted Mrs. Mudgett as in this case, she is the Marine scientist.  She states that she would want Todd Trujillo to be DNR/DNI.  She states that Todd Trujillo has become so frail and she would not want him to suffer through CPR or intubation.  Consults: GI  Family Communication:  Todd Trujillo's daughters Amy and Secundino Ginger and Todd Trujillo's wife Mrs. Sharlett Iles updated.  Severity of Illness: The appropriate Todd Trujillo status for this Todd Trujillo is OBSERVATION. Observation status is judged to be reasonable and necessary in order to provide the required intensity of service to ensure the Todd Trujillo's safety. The Todd Trujillo's presenting symptoms, physical exam findings, and initial radiographic and laboratory data in the context of their medical condition is felt to place them at decreased risk for further clinical deterioration. Furthermore, it is anticipated that the Todd Trujillo will be medically stable for discharge from the hospital within 2 midnights of admission.   Author: Jose Persia, MD 06/08/2022 9:02 PM  For on call review [email or web address].

## 2022-06-08 NOTE — ED Triage Notes (Signed)
Wednesday had emeses, Thursday diarrhea bloody urine and vomiting. Family states they feel he sounds grugly. PT has a history of a colon mass. Pt also drank water and chocked so they believe him to be aspirating.

## 2022-06-08 NOTE — ED Notes (Signed)
Informed RN bed assigned 

## 2022-06-08 NOTE — ED Triage Notes (Signed)
First Nurse: Pt here via [facility] with N/V and diarrhea x 2 days. Pt struggles to swallow and tends to aspirate when he swallows.  102 122/64 96% RA

## 2022-06-08 NOTE — Progress Notes (Signed)
[facility] ED 05 [facility]) Hospital Liaison Note  This patient has been referred to [facility] for hospice services at home. [facility] offered to send RN out to home to admit to hospice and evaluate for symptom management needs. Family opted to bring patient to ED for evaluation.Orion Crook called to let us know that the patient is to be admitted and requested we reach out once he has arrived in an inpatient room, rather than in the ED.  [facility]na will follow and reach out to family once patient has arrived in an inpatient bed.  Please call with any hospice related questions or concerns.  Thank you, Margaretmary Eddy, BSN, RN [facility] Liaison [phone]

## 2022-06-08 NOTE — Assessment & Plan Note (Addendum)
Patient presenting with 36-hour history of vomiting and diarrhea, although vomiting seems predominant.  Initially there was concern for melena, however emesis and stool described as brown.  CT abdomen/pelvis demonstrates right-sided colitis involving the ascending colon and cecum.  This is directly adjacent to the known right colon mass suspected to be malignancy that patient has declined workup for.  No evidence of obstruction.  Differential includes infectious colitis versus possibly ischemic, potentially secondary to adjacent colon mass.  Discussed with GI, who recommends testing for infectious etiology at this time and if results are negative, they will evaluate patient tomorrow.  - GI consulted; appreciate the recommendations - C. difficile and GI panel pending - Hold further antimicrobials until results return - Zofran as needed for nausea - IV fluid resuscitation

## 2022-06-08 NOTE — Plan of Care (Signed)
  Problem: Education: Goal: Knowledge of General Education information will improve Description: Including pain rating scale, medication(s)/side effects and non-pharmacologic comfort measures Outcome: Progressing   Problem: Health Behavior/Discharge Planning: Goal: Ability to manage health-related needs will improve Outcome: Progressing   Problem: Clinical Measurements: Goal: Ability to maintain clinical measurements within normal limits will improve Outcome: Progressing Goal: Will remain free from infection Outcome: Progressing Goal: Diagnostic test results will improve Outcome: Progressing Goal: Respiratory complications will improve Outcome: Progressing Goal: Cardiovascular complication will be avoided Outcome: Progressing   Problem: Activity: Goal: Risk for activity intolerance will decrease Outcome: Progressing   Problem: Nutrition: Goal: Adequate nutrition will be maintained Outcome: Progressing   Problem: Coping: Goal: Level of anxiety will decrease Outcome: Progressing   Problem: Elimination: Goal: Will not experience complications related to bowel motility Outcome: Progressing Goal: Will not experience complications related to urinary retention Outcome: Progressing   Problem: Pain Managment: Goal: General experience of comfort will improve Outcome: Progressing   Problem: Safety: Goal: Ability to remain free from injury will improve Outcome: Progressing   Problem: Skin Integrity: Goal: Risk for impaired skin integrity will decrease Outcome: Progressing   Problem: Education: Goal: Ability to identify signs and symptoms of gastrointestinal bleeding will improve Outcome: Progressing   Problem: Bowel/Gastric: Goal: Will show no signs and symptoms of gastrointestinal bleeding Outcome: Progressing   Problem: Fluid Volume: Goal: Will show no signs and symptoms of excessive bleeding 06/08/2022 2227 by Mordecai Rasmussen, RN Outcome: Progressing 06/08/2022  2227 by Mordecai Rasmussen, RN Outcome: Adequate for Discharge   Problem: Clinical Measurements: Goal: Complications related to the disease process, condition or treatment will be avoided or minimized Outcome: Progressing

## 2022-06-08 NOTE — Assessment & Plan Note (Signed)
Colonic mass discovered approximately 1 year ago and patient has followed up with gastroenterology, general surgery and oncology.  Due to advancing dementia and frail state, workup has been declined.

## 2022-06-09 DIAGNOSIS — R319 Hematuria, unspecified: Secondary | ICD-10-CM | POA: Diagnosis present

## 2022-06-09 DIAGNOSIS — E860 Dehydration: Secondary | ICD-10-CM | POA: Diagnosis present

## 2022-06-09 DIAGNOSIS — R112 Nausea with vomiting, unspecified: Secondary | ICD-10-CM | POA: Diagnosis not present

## 2022-06-09 DIAGNOSIS — Z1152 Encounter for screening for COVID-19: Secondary | ICD-10-CM | POA: Diagnosis not present

## 2022-06-09 DIAGNOSIS — Z85828 Personal history of other malignant neoplasm of skin: Secondary | ICD-10-CM | POA: Diagnosis not present

## 2022-06-09 DIAGNOSIS — A09 Infectious gastroenteritis and colitis, unspecified: Secondary | ICD-10-CM | POA: Diagnosis present

## 2022-06-09 DIAGNOSIS — C188 Malignant neoplasm of overlapping sites of colon: Secondary | ICD-10-CM | POA: Diagnosis present

## 2022-06-09 DIAGNOSIS — Z85528 Personal history of other malignant neoplasm of kidney: Secondary | ICD-10-CM | POA: Diagnosis not present

## 2022-06-09 DIAGNOSIS — R54 Age-related physical debility: Secondary | ICD-10-CM | POA: Diagnosis present

## 2022-06-09 DIAGNOSIS — K529 Noninfective gastroenteritis and colitis, unspecified: Secondary | ICD-10-CM | POA: Diagnosis not present

## 2022-06-09 DIAGNOSIS — F0390 Unspecified dementia without behavioral disturbance: Secondary | ICD-10-CM | POA: Diagnosis not present

## 2022-06-09 DIAGNOSIS — Z96651 Presence of right artificial knee joint: Secondary | ICD-10-CM | POA: Diagnosis present

## 2022-06-09 DIAGNOSIS — Z800 Family history of malignant neoplasm of digestive organs: Secondary | ICD-10-CM | POA: Diagnosis not present

## 2022-06-09 DIAGNOSIS — Z79899 Other long term (current) drug therapy: Secondary | ICD-10-CM | POA: Diagnosis not present

## 2022-06-09 DIAGNOSIS — Z8673 Personal history of transient ischemic attack (TIA), and cerebral infarction without residual deficits: Secondary | ICD-10-CM | POA: Diagnosis not present

## 2022-06-09 DIAGNOSIS — N400 Enlarged prostate without lower urinary tract symptoms: Secondary | ICD-10-CM | POA: Diagnosis present

## 2022-06-09 DIAGNOSIS — Z8546 Personal history of malignant neoplasm of prostate: Secondary | ICD-10-CM | POA: Diagnosis not present

## 2022-06-09 DIAGNOSIS — E785 Hyperlipidemia, unspecified: Secondary | ICD-10-CM | POA: Diagnosis present

## 2022-06-09 DIAGNOSIS — Z7982 Long term (current) use of aspirin: Secondary | ICD-10-CM | POA: Diagnosis not present

## 2022-06-09 DIAGNOSIS — J690 Pneumonitis due to inhalation of food and vomit: Secondary | ICD-10-CM | POA: Diagnosis present

## 2022-06-09 DIAGNOSIS — I10 Essential (primary) hypertension: Secondary | ICD-10-CM | POA: Diagnosis present

## 2022-06-09 DIAGNOSIS — Z803 Family history of malignant neoplasm of breast: Secondary | ICD-10-CM | POA: Diagnosis not present

## 2022-06-09 DIAGNOSIS — Z8701 Personal history of pneumonia (recurrent): Secondary | ICD-10-CM | POA: Diagnosis not present

## 2022-06-09 DIAGNOSIS — F0150 Vascular dementia without behavioral disturbance: Secondary | ICD-10-CM | POA: Diagnosis present

## 2022-06-09 DIAGNOSIS — Z66 Do not resuscitate: Secondary | ICD-10-CM | POA: Diagnosis present

## 2022-06-09 DIAGNOSIS — M1990 Unspecified osteoarthritis, unspecified site: Secondary | ICD-10-CM | POA: Diagnosis present

## 2022-06-09 DIAGNOSIS — K6389 Other specified diseases of intestine: Secondary | ICD-10-CM | POA: Diagnosis not present

## 2022-06-09 DIAGNOSIS — Z885 Allergy status to narcotic agent status: Secondary | ICD-10-CM | POA: Diagnosis not present

## 2022-06-09 LAB — COMPREHENSIVE METABOLIC PANEL
ALT: 7 U/L (ref 0–44)
AST: 20 U/L (ref 15–41)
Albumin: 3 g/dL — ABNORMAL LOW (ref 3.5–5.0)
Alkaline Phosphatase: 78 U/L (ref 38–126)
Anion gap: 7 (ref 5–15)
BUN: 18 mg/dL (ref 8–23)
CO2: 29 mmol/L (ref 22–32)
Calcium: 8.9 mg/dL (ref 8.9–10.3)
Chloride: 107 mmol/L (ref 98–111)
Creatinine, Ser: 0.78 mg/dL (ref 0.61–1.24)
GFR, Estimated: >60 mL/min (ref >60)
Glucose, Bld: 122 mg/dL — ABNORMAL HIGH (ref 70–99)
Potassium: 3.5 mmol/L (ref 3.5–5.1)
Sodium: 143 mmol/L (ref 135–145)
Total Bilirubin: 1 mg/dL (ref 0.3–1.2)
Total Protein: 6.1 g/dL — ABNORMAL LOW (ref 6.5–8.1)

## 2022-06-09 LAB — CBC
HCT: 29.1 % — ABNORMAL LOW (ref 39.0–52.0)
Hemoglobin: 9.2 g/dL — ABNORMAL LOW (ref 13.0–17.0)
MCH: 29.2 pg (ref 26.0–34.0)
MCHC: 31.6 g/dL (ref 30.0–36.0)
MCV: 92.4 fL (ref 80.0–100.0)
Platelets: 362 10*3/uL (ref 150–400)
RBC: 3.15 MIL/uL — ABNORMAL LOW (ref 4.22–5.81)
RDW: 14.7 % (ref 11.5–15.5)
WBC: 17.3 10*3/uL — ABNORMAL HIGH (ref 4.0–10.5)
nRBC: 0 % (ref 0.0–0.2)

## 2022-06-09 LAB — URINALYSIS, COMPLETE (UACMP) WITH MICROSCOPIC
Bilirubin Urine: NEGATIVE
Glucose, UA: NEGATIVE mg/dL
Hgb urine dipstick: NEGATIVE
Ketones, ur: 5 mg/dL — AB
Nitrite: POSITIVE — AB
Protein, ur: 30 mg/dL — AB
Specific Gravity, Urine: >1.046 — ABNORMAL HIGH (ref 1.005–1.030)
pH: 6 (ref 5.0–8.0)

## 2022-06-09 NOTE — Progress Notes (Signed)
[facility]) Hospital Liaison Note  Met with patient and wife in room to follow up with request for hospice services at home. Patient came to hospital prior to [facility] hospice services being initiated in the home.  [facility] will continue to follow patient for discharge disposition and coordinate hospice care when medically cleared for discharge.  Please call with any hospice related questions or concerns.  Thank you, Margaretmary Eddy, BSN, RN [facility] Liaison [phone]

## 2022-06-09 NOTE — Plan of Care (Signed)

## 2022-06-09 NOTE — Plan of Care (Signed)
  Problem: Education: Goal: Knowledge of General Education information will improve Description: Including pain rating scale, medication(s)/side effects and non-pharmacologic comfort measures 06/09/2022 0455 by Mordecai Rasmussen, RN Outcome: Progressing 06/08/2022 2227 by Mordecai Rasmussen, RN Outcome: Progressing   Problem: Health Behavior/Discharge Planning: Goal: Ability to manage health-related needs will improve 06/09/2022 0455 by Mordecai Rasmussen, RN Outcome: Progressing 06/08/2022 2227 by Mordecai Rasmussen, RN Outcome: Progressing   Problem: Clinical Measurements: Goal: Ability to maintain clinical measurements within normal limits will improve 06/09/2022 0455 by Mordecai Rasmussen, RN Outcome: Progressing 06/08/2022 2227 by Mordecai Rasmussen, RN Outcome: Progressing Goal: Will remain free from infection 06/09/2022 0455 by Mordecai Rasmussen, RN Outcome: Progressing 06/08/2022 2227 by Mordecai Rasmussen, RN Outcome: Progressing Goal: Diagnostic test results will improve 06/09/2022 0455 by Mordecai Rasmussen, RN Outcome: Progressing 06/08/2022 2227 by Mordecai Rasmussen, RN Outcome: Progressing Goal: Respiratory complications will improve 06/09/2022 0455 by Mordecai Rasmussen, RN Outcome: Progressing 06/08/2022 2227 by Mordecai Rasmussen, RN Outcome: Progressing Goal: Cardiovascular complication will be avoided 06/09/2022 0455 by Mordecai Rasmussen, RN Outcome: Progressing 06/08/2022 2227 by Mordecai Rasmussen, RN Outcome: Progressing   Problem: Activity: Goal: Risk for activity intolerance will decrease Outcome: Progressing   Problem: Nutrition: Goal: Adequate nutrition will be maintained Outcome: Progressing   Problem: Coping: Goal: Level of anxiety will decrease Outcome: Progressing   Problem: Elimination: Goal: Will not experience complications related to bowel motility Outcome: Progressing Goal: Will not experience complications related to urinary  retention Outcome: Progressing   Problem: Pain Managment: Goal: General experience of comfort will improve Outcome: Progressing   Problem: Safety: Goal: Ability to remain free from injury will improve Outcome: Progressing   Problem: Skin Integrity: Goal: Risk for impaired skin integrity will decrease Outcome: Progressing   Problem: Education: Goal: Ability to identify signs and symptoms of gastrointestinal bleeding will improve Outcome: Progressing   Problem: Bowel/Gastric: Goal: Will show no signs and symptoms of gastrointestinal bleeding Outcome: Progressing   Problem: Fluid Volume: Goal: Will show no signs and symptoms of excessive bleeding 06/08/2022 2227 by Mordecai Rasmussen, RN Outcome: Progressing 06/08/2022 2227 by Mordecai Rasmussen, RN Outcome: Adequate for Discharge   Problem: Clinical Measurements: Goal: Complications related to the disease process, condition or treatment will be avoided or minimized Outcome: Progressing

## 2022-06-09 NOTE — Consult Note (Signed)
Todd Trujillo , MD [address], [place], [place], [place], [id number] [address], [place], [place], [place], [address] Phone: [phone]  Fax: [phone]  Consultation  Referring Provider:     Dr Charleen Kirks Primary Care Physician:  Todd Pheasant, MD Primary Gastroenterologist:  None         Reason for Consultation:     Colitis   Date of Admission:  06/08/2022 Date of Consultation:  06/09/2022         HPI:   Todd Trujillo. is a 88 y.o. male is a patient who was admitted to the hospital with cough and vomiting which was nonbloody.  History of dementia, CVA, hypertension, iron deficiency anemia and suspected malignancy in the right colon, possible renal cell carcinoma who follows with Dr. Rogue Bussing as an outpatient last seen in May 2023 was found to have in March 2023 and ascending colon as well as the left kidney..  The patient was deemed to be not a surgical candidate per Dr. Christian Mate, family has been reluctant to pursue further management at [facility] in terms of colonic stenting due to the patient's clinical status.  There has been discussion about palliative care discussion at home.   This admission was found to have some brown diarrhea was hypoxic and has been diagnosed with aspiration pneumonia on oxygen on CT scan of the abdomen pelvis found colitis in the right colon cecum adjacent to the colonic mass.  I have been asked to provide further input into the colon at this findings seen on the CAT scan.I spoke with his eldest daughter and she says he began to throw up on Wednesday , on Thursday ie thanksgiving day started to have profuse non bloody diarrhea that has since resolved . No further vomiting . He cannot provide any history    On admission white cell count was 20,000 hemoglobin 11.3 g creatinine 1.06 lactic acid 2.5 urine analysis positive for nitrites and leukocytes  Past Medical History:  Diagnosis Date   Arthritis    Cancer (Todd Trujillo)    prostate    Hyperlipidemia    Hypertension    Left wrist fracture    Macular degeneration of right eye    Nephrolithiasis    Prostate CA (Todd Trujillo) 2003   Prostate troubles    Patient was unsure of term   Squamous cell carcinoma of skin 01/23/2017   R distal lat bicep near elbow   Squamous cell carcinoma of skin 03/05/2022   L forearm posterior - tx with ED&C    Past Surgical History:  Procedure Laterality Date   INTRAMEDULLARY (IM) NAIL INTERTROCHANTERIC Right 06/07/2021   Procedure: INTRAMEDULLARY (IM) NAIL INTERTROCHANTRIC;  Surgeon: Thornton Park, MD;  Location: [facility] ORS;  Service: Orthopedics;  Laterality: Right;   TOTAL KNEE ARTHROPLASTY Right     Prior to Admission medications   Medication Sig Start Date End Date Taking? Authorizing Provider  aspirin EC 81 MG tablet Take 1 tablet (81 mg total) by mouth daily. Swallow whole. 08/19/21  Yes Todd Pheasant, MD  sertraline (ZOLOFT) 25 MG tablet TAKE 1 TABLET BY MOUTH DAILY 05/07/22  Yes Todd Pheasant, MD  acetaminophen (TYLENOL) 500 MG tablet Take 500 mg by mouth every 6 (six) hours as needed.    [provider]  cholecalciferol (VITAMIN D3) 25 MCG (1000 UNIT) tablet Take 1,000 Units by mouth daily.    [provider]  ciprofloxacin (CILOXAN) 0.3 % ophthalmic solution PLACE 1 DROP INTO BOTH EYES EVERY 6 HOURS  04/20/22   Todd Pheasant, MD  Nutritional Supplements (FEEDING SUPPLEMENT, NEPRO CARB STEADY,) LIQD Take 237 mLs by mouth 2 (two) times daily between meals. 06/15/21   Sharen Hones, MD  omeprazole (PRILOSEC) 40 MG capsule Take 40 mg by mouth daily. Patient not taking: Reported on 04/11/2022 07/18/21   [provider]  QUEtiapine (SEROQUEL) 50 MG tablet TAKE 1 TABLET BY MOUTH AT BEDTIME. 03/28/22   Todd Pheasant, MD    Family History  Problem Relation Age of Onset   Colon cancer Mother    Esophageal cancer Brother    Breast cancer Daughter      Social History   Tobacco Use   Smoking status: Never     Passive exposure: Never   Smokeless tobacco: Never  Vaping Use   Vaping Use: Never used  Substance Use Topics   Alcohol use: No   Drug use: Not Currently    Allergies as of 06/08/2022 - Review Complete 06/08/2022  Allergen Reaction Noted   Codeine Nausea And Vomiting and Nausea Only 07/24/2015    Review of Systems:    Pt unabe to provide ROS   Physical Exam:  Vital signs in last 24 hours: Temp:  [97.7 F (36.5 C)-99.1 F (37.3 C)] 99.1 F (37.3 C) (11/25 1944) Pulse Rate:  [84-100] 100 (11/25 1944) Resp:  [18-24] 24 (11/25 1944) BP: (123-151)/(68-84) 139/84 (11/25 1944) SpO2:  [97 %-99 %] 97 % (11/25 1944) Weight:  [67.7 kg] 67.7 kg (11/24 2327) Last BM Date : 06/08/22 General:  comfortable  Head:  Normocephalic and atraumatic. Eyes:   No icterus.   Conjunctiva pink. PERRLA. Ears:  Normal auditory acuity. Neck:  Supple; no masses or thyroidomegaly Lungs: Respirations even and unlabored. Lungs clear to auscultation bilaterally.   No wheezes, crackles, or rhonchi.  Heart:  Regular rate and rhythm;  Without murmur, clicks, rubs or gallops Abdomen:  Soft, nondistended, nontender. Normal bowel sounds. No appreciable masses or hepatomegaly.  No rebound or guarding.  Neurologic:  Alert and oriented x0  Psych:  Awake but confused   LAB RESULTS: Recent Labs    06/08/22 1123 06/09/22 0637  WBC 20.7* 17.3*  HGB 11.3* 9.2*  HCT 37.0* 29.1*  PLT 480* 362   BMET Recent Labs    06/08/22 1123 06/09/22 0637  NA 144 143  K 4.7 3.5  CL 102 107  CO2 29 29  GLUCOSE 165* 122*  BUN 24* 18  CREATININE 1.06 0.78  CALCIUM 10.0 8.9   LFT Recent Labs    06/09/22 0637  PROT 6.1*  ALBUMIN 3.0*  AST 20  ALT 7  ALKPHOS 78  BILITOT 1.0   PT/INR No results for input(s): "LABPROT", "INR" in the last 72 hours.  STUDIES: CT Abdomen Pelvis W Contrast  Result Date: 06/08/2022 CLINICAL DATA:  Abdominal pain, nausea, vomiting and diarrhea, aspiration * Tracking Code: BO *  EXAM: CT ABDOMEN AND PELVIS WITH CONTRAST TECHNIQUE: Multidetector CT imaging of the abdomen and pelvis was performed using the standard protocol following bolus administration of intravenous contrast. RADIATION DOSE REDUCTION: This exam was performed according to the departmental dose-optimization program which includes automated exposure control, adjustment of the mA and/or kV according to patient size and/or use of iterative reconstruction technique. CONTRAST:  13m OMNIPAQUE IOHEXOL 300 MG/ML  SOLN COMPARISON:  10/03/2021 FINDINGS: Lower chest: Coronary artery calcifications. Heterogeneous opacity, consolidation, and bronchiolar plugging in the left-greater-than-right bilateral lung bases (series 4, image 8). Trace pleural effusions. Hepatobiliary: No solid liver abnormality is  seen. No gallstones, gallbladder wall thickening, or biliary dilatation. Pancreas: Atrophic appearing pancreas. No pancreatic ductal dilatation or surrounding inflammatory changes. Spleen: Normal in size without significant abnormality. Adrenals/Urinary Tract: Adrenal glands are unremarkable. Unchanged mixed solid and cystic exophytic lesion arising from the superior pole of the left kidney, measuring 3.9 x 3.8 cm, again with a peripheral nodule anteriorly which apparently enhances, measuring 2.1 x 1.5 cm (series 2, image 20). Bladder is unremarkable. Stomach/Bowel: Stomach is within normal limits. Similar appearance of circumferential wall thickening involving the mid ascending colon (series 2, image 34). There is new associated long segment wall thickening involving the remainder of the cecum and ascending colon on today's examination (series 2, image 41, series 5, image 34). Normal appendix. Sigmoid diverticula. Vascular/Lymphatic: Aortic atherosclerosis. No enlarged abdominal or pelvic lymph nodes. Reproductive: Prostate brachytherapy. Median lobe hypertrophy (series 6, image 69). Other: No abdominal wall hernia or abnormality. No  ascites. Musculoskeletal: No acute osseous findings. Status post intramedullary nail fixation of the proximal right femur. Unchanged wedge deformities of L1 and L3. IMPRESSION: 1. Heterogeneous opacity, consolidation, and bronchiolar plugging in the left-greater-than-right bilateral lung bases, consistent with reported history of aspiration. Trace pleural effusions. 2. Similar appearance of circumferential wall thickening involving the mid ascending colon, highly suspicious for primary colon malignancy. 3. New associated long segment wall thickening involving the remainder of the cecum and ascending colon on today's examination, suggesting nonspecific infectious or inflammatory colitis. 4. Unchanged mixed solid and cystic exophytic lesion arising from the superior pole of the left kidney, remaining highly suspicious for a cystic renal cell carcinoma. 5. No evidence of lymphadenopathy or metastatic disease in the abdomen or pelvis. 6. Coronary artery disease. 7. Prostate brachytherapy. Aortic Atherosclerosis (ICD10-I70.0). Electronically Signed   By: Delanna Ahmadi M.D.   On: 06/08/2022 15:22   DG Chest 2 View  Result Date: 06/08/2022 CLINICAL DATA:  88 year old male with question of aspiration. EXAM: CHEST - 2 VIEW COMPARISON:  June 06, 2021. FINDINGS: AP projection rotated to the LEFT. Accounting for this cardiomediastinal contours and hilar structures are stable. There is no sign of lobar consolidative change. Mild prominence of interstitium at the LEFT and RIGHT lung base. No pneumothorax. Potential trace pleural effusions. Lateral projection limited by patient condition. No acute or destructive bone findings on limited assessment. IMPRESSION: Mild prominence of interstitium at the LEFT and RIGHT lung base. Findings may be related to chronic changes from prior aspiration and are much improved compared to more remote imaging. Attention on follow-up is suggested if there is continued concern for a changes  related to acute aspiration event. Potential trace bilateral pleural effusions. Electronically Signed   By: Zetta Bills M.D.   On: 06/08/2022 12:10      Impression / Plan:   Ciel Yanes. is a 88 y.o. y/o male with history of CVA, and dementia suspected colonic and renal mass has not undergone endoscopy evaluation due to frailty and inability to perform a colonic prep.  Patient is established with Dr. Rogue Bussing in oncology and there have been discussions about palliative care.  I have been contacted during this admission and the patient has been admitted with a pneumonia, possible UTI, nonbloody emesis and acute diarrhea.  CAT scan showed features of colitis in the ascending colon that I have been consulted to provide and input.  Impression: The colitis seen on the CAT scan could possibly related to ischemia versus infectious diarrhea which ismost likely and has resolved clinically . This can cuse  a colitis.  Could also be related to the colon mass in the area.  Plan 1.  Check stool for GI PCR and C. Difficile if has diarrhea  2.  Continue rehydration which is a treatment for ischemic colitis as well as infectious diarrhea. 3.  If stool test  is done and shows that it is positive for C. difficile treat appropriately if it is positive for any other bug then may need to consider short course of azithromycin for 3 days if appropriate and if the stool tests are negative do nothing further. 4.  From the colon mass point of view I believe oncology is already discussed with the family and further evaluation/management can continue as an outpatient. 5.  May be worth addressing goals of care further if not already done so   I will sign off.  Please call me if any further GI concerns or questions.  We would like to thank you for the opportunity to participate in the care of Kippy Gohman..    Thank you for involving me in the care of this patient.      LOS: 0 days    Todd Bellows, MD  06/09/2022, 8:46 PM

## 2022-06-09 NOTE — Progress Notes (Signed)
[facility] [place] NAME: Todd Trujillo    MR#:  [id number]  DATE OF BIRTH:  Nov 29, 1929  SUBJECTIVE:  wife at bedside. Patient tolerating pured diet. Drinking thin liquids for pleasure. Has history of recurrent aspiration. Has baseline vascular dementia answered some questions appropriately. No respiratory distress. Per RN no diarrhea since patient has been in the hospital.  VITALS:  Blood pressure (!) 151/77, pulse 88, temperature 97.7 F (36.5 C), temperature source Oral, resp. rate 20, height '5\' 10"'$  (1.778 m), weight 67.7 kg, SpO2 99 %.  PHYSICAL EXAMINATION:   GENERAL:  88 y.o.-year-old patient lying in the bed with no acute distress.  LUNGS: decreased breath sounds bilaterally, no wheezing CARDIOVASCULAR: S1, S2 normal. No murmurs ABDOMEN: Soft, nontender, nondistended. Bowel sounds present.  EXTREMITIES: No  edema b/l.    NEUROLOGIC: nonfocal  patient is alert  SKIN: No obvious rash, lesion, or ulcer.   LABORATORY PANEL:  CBC Recent Labs  Lab 06/09/22 0637  WBC 17.3*  HGB 9.2*  HCT 29.1*  PLT 362    Chemistries  Recent Labs  Lab 06/09/22 0637  NA 143  K 3.5  CL 107  CO2 29  GLUCOSE 122*  BUN 18  CREATININE 0.78  CALCIUM 8.9  AST 20  ALT 7  ALKPHOS 78  BILITOT 1.0   Cardiac Enzymes No results for input(s): "TROPONINI" in the last 168 hours. RADIOLOGY:  CT Abdomen Pelvis W Contrast  Result Date: 06/08/2022 CLINICAL DATA:  Abdominal pain, nausea, vomiting and diarrhea, aspiration * Tracking Code: BO * EXAM: CT ABDOMEN AND PELVIS WITH CONTRAST TECHNIQUE: Multidetector CT imaging of the abdomen and pelvis was performed using the standard protocol following bolus administration of intravenous contrast. RADIATION DOSE REDUCTION: This exam was performed according to the departmental dose-optimization program which includes automated exposure control, adjustment of the mA and/or kV according to patient size  and/or use of iterative reconstruction technique. CONTRAST:  190m OMNIPAQUE IOHEXOL 300 MG/ML  SOLN COMPARISON:  10/03/2021 FINDINGS: Lower chest: Coronary artery calcifications. Heterogeneous opacity, consolidation, and bronchiolar plugging in the left-greater-than-right bilateral lung bases (series 4, image 8). Trace pleural effusions. Hepatobiliary: No solid liver abnormality is seen. No gallstones, gallbladder wall thickening, or biliary dilatation. Pancreas: Atrophic appearing pancreas. No pancreatic ductal dilatation or surrounding inflammatory changes. Spleen: Normal in size without significant abnormality. Adrenals/Urinary Tract: Adrenal glands are unremarkable. Unchanged mixed solid and cystic exophytic lesion arising from the superior pole of the left kidney, measuring 3.9 x 3.8 cm, again with a peripheral nodule anteriorly which apparently enhances, measuring 2.1 x 1.5 cm (series 2, image 20). Bladder is unremarkable. Stomach/Bowel: Stomach is within normal limits. Similar appearance of circumferential wall thickening involving the mid ascending colon (series 2, image 34). There is new associated long segment wall thickening involving the remainder of the cecum and ascending colon on today's examination (series 2, image 41, series 5, image 34). Normal appendix. Sigmoid diverticula. Vascular/Lymphatic: Aortic atherosclerosis. No enlarged abdominal or pelvic lymph nodes. Reproductive: Prostate brachytherapy. Median lobe hypertrophy (series 6, image 69). Other: No abdominal wall hernia or abnormality. No ascites. Musculoskeletal: No acute osseous findings. Status post intramedullary nail fixation of the proximal right femur. Unchanged wedge deformities of L1 and L3. IMPRESSION: 1. Heterogeneous opacity, consolidation, and bronchiolar plugging in the left-greater-than-right bilateral lung bases, consistent with reported history of aspiration. Trace pleural effusions. 2. Similar appearance of circumferential  wall thickening involving the mid ascending colon, highly suspicious for primary colon  malignancy. 3. New associated long segment wall thickening involving the remainder of the cecum and ascending colon on today's examination, suggesting nonspecific infectious or inflammatory colitis. 4. Unchanged mixed solid and cystic exophytic lesion arising from the superior pole of the left kidney, remaining highly suspicious for a cystic renal cell carcinoma. 5. No evidence of lymphadenopathy or metastatic disease in the abdomen or pelvis. 6. Coronary artery disease. 7. Prostate brachytherapy. Aortic Atherosclerosis (ICD10-I70.0). Electronically Signed   By: Delanna Ahmadi M.D.   On: 06/08/2022 15:22   DG Chest 2 View  Result Date: 06/08/2022 CLINICAL DATA:  88 year old male with question of aspiration. EXAM: CHEST - 2 VIEW COMPARISON:  June 06, 2021. FINDINGS: AP projection rotated to the LEFT. Accounting for this cardiomediastinal contours and hilar structures are stable. There is no sign of lobar consolidative change. Mild prominence of interstitium at the LEFT and RIGHT lung base. No pneumothorax. Potential trace pleural effusions. Lateral projection limited by patient condition. No acute or destructive bone findings on limited assessment. IMPRESSION: Mild prominence of interstitium at the LEFT and RIGHT lung base. Findings may be related to chronic changes from prior aspiration and are much improved compared to more remote imaging. Attention on follow-up is suggested if there is continued concern for a changes related to acute aspiration event. Potential trace bilateral pleural effusions. Electronically Signed   By: Zetta Bills M.D.   On: 06/08/2022 12:10    Assessment and [place]. is a 88 y.o. male with medical history significant of vascular dementia, CVA, hypertension, hyperlipidemia, recurrent aspiration pneumonia, iron deficiency anemia, suspected right colon malignancy,  suspected renal cell carcinoma, who presents to the ED with complaints of vomiting and bloody diarrhea.   CT abdomen/pelvis was obtained that demonstrated new associated long segment of wall thickening involving the cecum and ascending colon next to the previously present circumferential wall thickening suspected to be primary colon malignancy.  In addition, bilateral lung opacities consistent with aspiration present.  Acute colitis -- evident on CT abdomen pelvis with right-sided colitis, elevated white count, diarrhea. Patient also has known history of: mass suspected malignancy but declined workup. No evidence of obstruction. -- No diarrhea since admission.- --Continue broad-spectrum antibiotic -- stool studies pending -- IV fluids -- PRN Zofran  Recurrent aspiration pneumonia -- continue supplemental oxygen maintenance as above 88% -- speech therapy evaluation noted. Patient will remain at a high risk of aspiration. Currently on pured diet with thin liquids. Family understands the risk and are okay to give food for pleasure -- already on broad-spectrum antibiotic should cover  Dementia without behavioral disturbance -- patient will be followed by hospice at home.  Hematuria -- per daughter noted few drops of blood coming from the urethra -- has known history of renal cell carcinoma and history of BPH -- continue to monitor. Follow-up urology as needed    Procedures: none Family communication : wife at bedside Consults : none CODE STATUS: DNR DNI DVT Prophylaxis : SCD Level of care: Telemetry Medical Status is: Inpatient Remains inpatient appropriate because: acute colitis, aspiration pneumonia    TOTAL TIME TAKING CARE OF THIS PATIENT: 35 minutes.  >50% time spent on counselling and coordination of care  Note: This dictation was prepared with Dragon dictation along with smaller phrase technology. Any transcriptional errors that result from this process are  unintentional.  Fritzi Mandes M.D    [facility]   CC: Primary care physician; Einar Pheasant, MD

## 2022-06-09 NOTE — Evaluation (Addendum)
Clinical/Bedside Swallow Evaluation Patient Details  Name: Todd Trujillo. MRN: [id number] Date of Birth: Oct 17, 1929  Today's Date: 06/09/2022 Time: SLP Start Time (ACUTE ONLY): 0930 SLP Stop Time (ACUTE ONLY): 1030 SLP Time Calculation (min) (ACUTE ONLY): 60 min  Past Medical History:  Past Medical History:  Diagnosis Date   Arthritis    Cancer ([place])    prostate   Hyperlipidemia    Hypertension    Left wrist fracture    Macular degeneration of right eye    Nephrolithiasis    Prostate CA ([place]) 2003   Prostate troubles    Patient was unsure of term   Squamous cell carcinoma of skin 01/23/2017   R distal lat bicep near elbow   Squamous cell carcinoma of skin 03/05/2022   L forearm posterior - tx with ED&C   Past Surgical History:  Past Surgical History:  Procedure Laterality Date   INTRAMEDULLARY (IM) NAIL INTERTROCHANTERIC Right 06/07/2021   Procedure: INTRAMEDULLARY (IM) NAIL INTERTROCHANTRIC;  Surgeon: Thornton Park, MD;  Location: [facility] ORS;  Service: Orthopedics;  Laterality: Right;   TOTAL KNEE ARTHROPLASTY Right    HPI:  Pt is a 88 y.o. male with medical history significant of vascular Dementia, dysphagia, CVA, hypertension, hyperlipidemia, recurrent aspiration pneumonia, iron deficiency anemia, suspected right colon malignancy, suspected renal cell carcinoma, who presents to the ED with complaints of Vomiting episodes and bloody diarrhea. History obtained from patient's daughter Amy at bedside due to patient's inability to speak (throat pain)and dementia.      Amy states that on 11/22, Mr. Rosten coughed up brown sputum but seemed otherwise at his baseline at that time. He subsequently developed nausea with vomiting that persisted through out the day and at night.  Emesis was brown and occasionally dark but no black coffee grounds noted.  She states one of her sisters may have seen some bright red in the emesis but she is unsure.  In the evening of 11/23 into  today, patient began to experience dark brown diarrhea that was "explosive".  Amy denies any hematochezia or melena.  She denies any known fevers, chills, chest pain, shortness of breath.  She did notice some bright red blood coming from his urethral opening but states it has only been a couple drops at a time.  She has not noticed any difficulty with urination.  No recent antibiotic use.  No recent medication changes.   CT of abdomen: Heterogeneous opacity, consolidation, and bronchiolar plugging in  the left-greater-than-right bilateral lung bases, consistent with  reported history of aspiration. Trace pleural effusions.  2. Similar appearance of circumferential wall thickening involving  the mid ascending colon, highly suspicious for primary colon  malignancy.  3. New associated long segment wall thickening involving the  remainder of the cecum and ascending colon on today's examination,  suggesting nonspecific infectious or inflammatory colitis.  4. Unchanged mixed solid and cystic exophytic lesion arising from  the superior pole of the left kidney, remaining highly suspicious  for a cystic renal cell carcinoma.    Assessment / Plan / Recommendation  Clinical Impression   Pt seen for BSE today; Dtr then Wife present in room. Spoke w/ another Dtr via TC(Amy). Pt awake, resting in bed. Dtr then Wife present in room. Pt was verbal but w/ raspy vocal quality. He followed a few instructions w/ cues/support. Baseline of Dementia, Cognitive decline.  On [place] O2 support, 2L; WBC declining. Afebrile.   Per chart notes and BSE/MBSS(07/2021), pt has a Baseline, Chronic  h/o oropharyngeal phase Dysphagia. Pt also has comorbidities including: Dementia, subacute SDHs, hx of CVA, dependence on assistance for feeding, medical comorbidities, Advanced Age, and dental status. All of these factors increase his risk for aspiration/aspiration pneumonia. Dtrs and Wife have stated they would like pt to have foods/liquids for his  "comfort and pleasure", "just carefully". All have stated verbal agreement that they UNDERSTAND that pt has Dysphagia and is at risk for aspiration and pulmonary impact/decline from it.  See MBSS in 07/2021 describing: Moderate, chronic oropharyngeal phase dysphagia w/ silent aspiration.  Pt has a Baseline of, and appears to present w/, oropharyngeal phase Dysphagia w/ oropharyngeal phase deficits and suspected Neuromuscular impact; also noted declined Cognitive status(baseline Dementia) impacting his overall awareness/engagement during po tasks -- which increases risk for aspiration. Pt required Mod-Max tactile/verbal/visual cues for orientation to bolus presentation, follow through w/ tasks, and self-feeding support(hand over hand). Pt is at risk for aspiration/aspiration pneumonia w/ concern for meeting nutrition/hydration needs w/ oral intake.  Pt consumed trials of single ice chips, thin liquids via PINCHED STRAW, and purees mostly fed to him w/ hand over hand support. No immediate, overt clinical s/s of aspiration noted w/ trials of thin liquids, however, delayed throat clearing and cough were noted during session b/t trials. No overall decline in respiratory presentation noted during/post trials; O2 sats remained 97-98% during/post trials. Vocal quality was raspy at baseline; hyolaryngeal excursion appeared min reduced during the swallow.  Suspect decreased attention to task as well as delayed pharyngeal swallowin initiation could impact safety and success of pharyngeal swallow w/ thin liquids, po's.  Oral phase was c/b decreased awareness of the boluses -- moreso the purees. He allowed puree boluses to lay orally w/out immediate attention to the trials for management and swallow. Oral attention was slow and munchy w/ vertical movements during deliberate bolus management; A-P transfer was min slower along w/ oral clearing. TIME was given b/t each bolus for pt to use lingual sweeping and f/u, Dry  swallows to aid clearing oral cavity of bolus residue remaining. This, and alternating b/t foods/liquids, aided oral clearing fully. W/ liquid trials, pt pursed lips and sucked on straw Impulsively requiring PINCHING of straw to limit bolus volume/amount -- to slow him down to 1-2 sips at a time. Pt could not follow through w/ verbal instructions.  OF NOTE: Family stated they had noted oral holding and pocketing of solid foods at home in recent weeks. They stated they had to give verbal/tactile cues for him to "swallow and clear his mouth". OM exam was cursory d/t pt's reduced follow through, but no unilateral lingual weakness was noted. Generalized oral weakness noted w/ slight open-mouth posture at rest.    Post thorough discussion w/ MD and Family members(who requested pt have food/drink for his "comfort and pleasure"), recommend dysphagia level 1 diet(purees) w/ thin liquids; STRICT aspiration precautions including monitoring for oral clearing and PINCHING straw to limit volume; feeding assistance and Rest breaks during meals. Pills Crushed in puree for safety. Reduce Distractions during meals and check ofr oral clearing during/post intake. NSG/MD updated.  Recommend Palliative Care f/u for GOC. OF NOTE: family had contacted Hospice Services from home prior to admit -- this is a recommended services as well d/t pt's Chronic condition of Dysphagia impacting safety of any oral intake. ST services can be available for further education if needed while admitted. Precautions posted and provided to Family.  SLP Visit Diagnosis: Dysphagia, oropharyngeal phase (R13.12) (baseline Dementia; baseline dsyphagia)  Aspiration Risk  Moderate aspiration risk;Risk for inadequate nutrition/hydration    Diet Recommendation   dysphagia level 1 diet(purees) w/ thin liquids; STRICT aspiration precautions including monitoring for oral clearing and PINCHING straw to limit volume; feeding assistance and Rest breaks during  meals. Reduce Distractions during meals and check for oral clearing/give Time during/post intake.   Medication Administration: Crushed with puree    Other  Recommendations Recommended Consults:  (Palliative Care/Hospice consult) Oral Care Recommendations: Oral care BID;Oral care before and after PO;Staff/trained caregiver to provide oral care Other Recommendations:  (n/a)    Recommendations for follow up therapy are one component of a multi-disciplinary discharge planning process, led by the attending physician.  Recommendations may be updated based on patient status, additional functional criteria and insurance authorization.  Follow up Recommendations No SLP follow up (Palliative care f/u)      Assistance Recommended at Discharge    Functional Status Assessment Patient has had a recent decline in their functional status and/or demonstrates limited ability to make significant improvements in function in a reasonable and predictable amount of time  Frequency and Duration  (n/a)   (n/a)       Prognosis Prognosis for Safe Diet Advancement: Guarded Barriers to Reach Goals: Cognitive deficits;Language deficits;Time post onset;Severity of deficits;Behavior Barriers/Prognosis Comment: pt has a baseline of Dysphagia w/ risk for aspiration/pna - f/u w/ Palliative care for [place] is recommended      Swallow Study   General Date of Onset: 06/08/22 HPI: Pt is a 88 y.o. male with medical history significant of vascular Dementia, dysphagia, CVA, hypertension, hyperlipidemia, recurrent aspiration pneumonia, iron deficiency anemia, suspected right colon malignancy, suspected renal cell carcinoma, who presents to the ED with complaints of Vomiting episodes and bloody diarrhea. History obtained from patient's daughter Amy at bedside due to patient's inability to speak (throat pain)and dementia.      Amy states that on 11/22, Mr. Irani coughed up brown sputum but seemed otherwise at his baseline at that  time. He subsequently developed nausea with vomiting that persisted through out the day and at night.  Emesis was brown and occasionally dark but no black coffee grounds noted.  She states one of her sisters may have seen some bright red in the emesis but she is unsure.  In the evening of 11/23 into today, patient began to experience dark brown diarrhea that was "explosive".  Amy denies any hematochezia or melena.  She denies any known fevers, chills, chest pain, shortness of breath.  She did notice some bright red blood coming from his urethral opening but states it has only been a couple drops at a time.  She has not noticed any difficulty with urination.  No recent antibiotic use.  No recent medication changes.   CT of abdomen: Heterogeneous opacity, consolidation, and bronchiolar plugging in  the left-greater-than-right bilateral lung bases, consistent with  reported history of aspiration. Trace pleural effusions.  2. Similar appearance of circumferential wall thickening involving  the mid ascending colon, highly suspicious for primary colon  malignancy.  3. New associated long segment wall thickening involving the  remainder of the cecum and ascending colon on today's examination,  suggesting nonspecific infectious or inflammatory colitis.  4. Unchanged mixed solid and cystic exophytic lesion arising from  the superior pole of the left kidney, remaining highly suspicious  for a cystic renal cell carcinoma. Type of Study: Bedside Swallow Evaluation Previous Swallow Assessment: BSE, MBSS 07/2921: Moderate, chronic oropharyngeal phase dysphagia-  rec. mechanical soft diet  with chopped and well-moistened meats, thin liquids, meds crushed in puree. Pt will need assistance/cuing for chin tuck for each swallow. Clear oral cavity prior to taking another bite/sip. Will need 2-3 clearing swallows to reduce residue for each bite/sip. Dtr/family wished for oral diet. Diet Prior to this Study: Regular;Thin liquids (cut  foods well for him) Temperature Spikes Noted: No (wbc 17.3) Respiratory Status: Nasal cannula (2L) History of Recent Intubation: No Behavior/Cognition: Alert;Cooperative;Pleasant mood;Confused;Distractible;Requires cueing (baseline Dementia) Oral Cavity Assessment: Dry (sticky) Oral Care Completed by SLP: Yes Oral Cavity - Dentition: Missing dentition (MOST, few front lower teeth only) Vision: Functional for self-feeding Self-Feeding Abilities: Able to feed self;Needs assist;Needs set up;Total assist (helped to hold cup) Patient Positioning: Upright in bed (needed full positioning support) Baseline Vocal Quality: Low vocal intensity (raspy -- s/p Vomiting episodes) Volitional Cough: Cognitively unable to elicit Volitional Swallow: Unable to elicit    Oral/Motor/Sensory Function Overall Oral Motor/Sensory Function: Generalized oral weakness (slight open mouth posture at rest)   Ice Chips Ice chips: Impaired Presentation: Spoon (fed; 3 trials) Oral Phase Impairments: Poor awareness of bolus;Reduced lingual movement/coordination Oral Phase Functional Implications: Oral holding;Oral residue;Prolonged oral transit Pharyngeal Phase Impairments: Suspected delayed Swallow   Thin Liquid Thin Liquid: Impaired Presentation: Straw;Self Fed (fully support; 12 trials) Oral Phase Impairments: Poor awareness of bolus (impulsive w/ sipping) Oral Phase Functional Implications:  (none) Pharyngeal  Phase Impairments: Suspected delayed Swallow;Cough - Delayed;Throat Clearing - Delayed (x2(total)) Other Comments: 98%    Nectar Thick Nectar Thick Liquid: Not tested   Honey Thick Honey Thick Liquid: Not tested   Puree Puree: Impaired Presentation: Spoon (fed; 12 trials) Oral Phase Impairments: Reduced labial seal;Reduced lingual movement/coordination;Poor awareness of bolus Oral Phase Functional Implications: Prolonged oral transit;Oral residue;Oral holding Pharyngeal Phase Impairments: Multiple  swallows;Decreased hyoid-laryngeal movement Other Comments: cleared orally w/ Time given   Solid     Solid: Not tested        [name], MS, CCC-SLP Speech Language Pathologist Rehab Services; [place] [phone] (ascom) Demaris Bousquet 06/09/2022,3:40 PM

## 2022-06-09 NOTE — Progress Notes (Signed)
   06/09/22 0401  Provider Notification  Provider Name/Title Randol Kern NP  Date Provider Notified 06/09/22  Time Provider Notified [phone]  Method of Notification Page (Secure chat)  Notification Reason Other (Comment) (Increast chest congestion, crackles and wheeze to bilateral lung fields, airway secretions unable to expectorate/clear by patient.)  Provider response Other (Comment) (DC IVF LR at 125 ml/hr)  Date of Provider Response 06/09/22  Time of Provider Response [phone]

## 2022-06-09 NOTE — Progress Notes (Signed)
Pt was with care team at first attempt this morning, and at second attempt this evening appeared to be resting. Wife has been at bedside. A visit in the morning would be appreciated.  Pt desires to go home with hospice and would appreciate paperwork in hospital. Note has been left for on call chaplain in AM

## 2022-06-10 DIAGNOSIS — K529 Noninfective gastroenteritis and colitis, unspecified: Secondary | ICD-10-CM | POA: Diagnosis not present

## 2022-06-10 LAB — CBC
HCT: 30.2 % — ABNORMAL LOW (ref 39.0–52.0)
Hemoglobin: 9.3 g/dL — ABNORMAL LOW (ref 13.0–17.0)
MCH: 28.5 pg (ref 26.0–34.0)
MCHC: 30.8 g/dL (ref 30.0–36.0)
MCV: 92.6 fL (ref 80.0–100.0)
Platelets: 373 10*3/uL (ref 150–400)
RBC: 3.26 MIL/uL — ABNORMAL LOW (ref 4.22–5.81)
RDW: 14.6 % (ref 11.5–15.5)
WBC: 15.5 10*3/uL — ABNORMAL HIGH (ref 4.0–10.5)
nRBC: 0 % (ref 0.0–0.2)

## 2022-06-10 LAB — URINE CULTURE: Culture: NO GROWTH

## 2022-06-10 NOTE — Progress Notes (Signed)
[facility] [place] NAME: Todd Trujillo    MR#:  [id number]  DATE OF BIRTH:  1930-05-08  SUBJECTIVE:  wife at bedside. Patient tolerating pured diet. Drinking thin liquids for pleasure. Has history of recurrent aspiration. Has baseline vascular dementia answered some questions appropriately. No respiratory distress. Per RN no diarrhea since patient has been in the hospital. Daughter and wife at bedside this morning. They seem to feel patient has improved a lot  VITALS:  Blood pressure 138/72, pulse 80, temperature 98.3 F (36.8 C), resp. rate 18, height '5\' 10"'$  (1.778 m), weight 67.7 kg, SpO2 97 %.  PHYSICAL EXAMINATION:   GENERAL:  88 y.o.-year-old patient lying in the bed with no acute distress.  LUNGS: decreased breath sounds bilaterally, no wheezing CARDIOVASCULAR: S1, S2 normal. No murmurs ABDOMEN: Soft, nontender, nondistended. Bowel sounds present.  EXTREMITIES: No  edema b/l.    NEUROLOGIC: nonfocal  patient is alert  SKIN: per RN  LABORATORY PANEL:  CBC Recent Labs  Lab 06/10/22 0830  WBC 15.5*  HGB 9.3*  HCT 30.2*  PLT 373     Chemistries  Recent Labs  Lab 06/09/22 0637  NA 143  K 3.5  CL 107  CO2 29  GLUCOSE 122*  BUN 18  CREATININE 0.78  CALCIUM 8.9  AST 20  ALT 7  ALKPHOS 78  BILITOT 1.0    Cardiac Enzymes No results for input(s): "TROPONINI" in the last 168 hours. RADIOLOGY:  CT Abdomen Pelvis W Contrast  Result Date: 06/08/2022 CLINICAL DATA:  Abdominal pain, nausea, vomiting and diarrhea, aspiration * Tracking Code: BO * EXAM: CT ABDOMEN AND PELVIS WITH CONTRAST TECHNIQUE: Multidetector CT imaging of the abdomen and pelvis was performed using the standard protocol following bolus administration of intravenous contrast. RADIATION DOSE REDUCTION: This exam was performed according to the departmental dose-optimization program which includes automated exposure control, adjustment of the mA and/or  kV according to patient size and/or use of iterative reconstruction technique. CONTRAST:  138m OMNIPAQUE IOHEXOL 300 MG/ML  SOLN COMPARISON:  10/03/2021 FINDINGS: Lower chest: Coronary artery calcifications. Heterogeneous opacity, consolidation, and bronchiolar plugging in the left-greater-than-right bilateral lung bases (series 4, image 8). Trace pleural effusions. Hepatobiliary: No solid liver abnormality is seen. No gallstones, gallbladder wall thickening, or biliary dilatation. Pancreas: Atrophic appearing pancreas. No pancreatic ductal dilatation or surrounding inflammatory changes. Spleen: Normal in size without significant abnormality. Adrenals/Urinary Tract: Adrenal glands are unremarkable. Unchanged mixed solid and cystic exophytic lesion arising from the superior pole of the left kidney, measuring 3.9 x 3.8 cm, again with a peripheral nodule anteriorly which apparently enhances, measuring 2.1 x 1.5 cm (series 2, image 20). Bladder is unremarkable. Stomach/Bowel: Stomach is within normal limits. Similar appearance of circumferential wall thickening involving the mid ascending colon (series 2, image 34). There is new associated long segment wall thickening involving the remainder of the cecum and ascending colon on today's examination (series 2, image 41, series 5, image 34). Normal appendix. Sigmoid diverticula. Vascular/Lymphatic: Aortic atherosclerosis. No enlarged abdominal or pelvic lymph nodes. Reproductive: Prostate brachytherapy. Median lobe hypertrophy (series 6, image 69). Other: No abdominal wall hernia or abnormality. No ascites. Musculoskeletal: No acute osseous findings. Status post intramedullary nail fixation of the proximal right femur. Unchanged wedge deformities of L1 and L3. IMPRESSION: 1. Heterogeneous opacity, consolidation, and bronchiolar plugging in the left-greater-than-right bilateral lung bases, consistent with reported history of aspiration. Trace pleural effusions. 2. Similar  appearance of circumferential wall thickening involving  the mid ascending colon, highly suspicious for primary colon malignancy. 3. New associated long segment wall thickening involving the remainder of the cecum and ascending colon on today's examination, suggesting nonspecific infectious or inflammatory colitis. 4. Unchanged mixed solid and cystic exophytic lesion arising from the superior pole of the left kidney, remaining highly suspicious for a cystic renal cell carcinoma. 5. No evidence of lymphadenopathy or metastatic disease in the abdomen or pelvis. 6. Coronary artery disease. 7. Prostate brachytherapy. Aortic Atherosclerosis (ICD10-I70.0). Electronically Signed   By: Delanna Ahmadi M.D.   On: 06/08/2022 15:22    Assessment and Plan  Todd Trujillo. is a 88 y.o. male with medical history significant of vascular dementia, CVA, hypertension, hyperlipidemia, recurrent aspiration pneumonia, iron deficiency anemia, suspected right colon malignancy, suspected renal cell carcinoma, who presents to the ED with complaints of vomiting and bloody diarrhea.   CT abdomen/pelvis was obtained that demonstrated new associated long segment of wall thickening involving the cecum and ascending colon next to the previously present circumferential wall thickening suspected to be primary colon malignancy.  In addition, bilateral lung opacities consistent with aspiration present.  Acute colitis -- evident on CT abdomen pelvis with right-sided colitis, elevated white count, diarrhea. Patient also has known history of: mass suspected malignancy but declined workup. No evidence of obstruction. -- No diarrhea since admission--stool studies pending (d/ced for now) --Continue broad-spectrum antibiotic -- IV fluids -- PRN Zofran --seen BY GI dr Gardiner Rhyme off  Recurrent aspiration pneumonia -- continue supplemental oxygen maintenance as above 88% -- speech therapy evaluation noted. Patient will remain at a  high risk of aspiration. Currently on pured diet with thin liquids. Family understands the risk and are okay to give food for pleasure -- already on broad-spectrum antibiotic should cover  Dementia without behavioral disturbance -- patient will be followed by hospice at home--Evaluation done  Hematuria -- per daughter noted few drops of blood coming from the urethra -- has known history of renal cell carcinoma and history of BPH -- continue to monitor. Follow-up urology as needed    Procedures: none Family communication : wife/dter at bedside Consults : none CODE STATUS: DNR DNI DVT Prophylaxis : SCD (hematuria) Level of care: Telemetry Medical Status is: Inpatient Remains inpatient appropriate because: acute colitis, aspiration pneumonia Anticipate d/c 1-2 days --home with hospice  TOTAL TIME TAKING CARE OF THIS PATIENT: 35 minutes.  >50% time spent on counselling and coordination of care  Note: This dictation was prepared with Dragon dictation along with smaller phrase technology. Any transcriptional errors that result from this process are unintentional.  Fritzi Mandes M.D    [facility]   CC: Primary care physician; Einar Pheasant, MD

## 2022-06-10 NOTE — Plan of Care (Signed)

## 2022-06-10 NOTE — Progress Notes (Signed)
[facility]) Hospital Liaison Note  [facility] continues to follow Mr. Fritchman for discharge disposition and coordination of hospice care when medically cleared for discharge.  Please all with any hospice related questions or concerned.  Thank you for allowing participation in Mr. Regis care.  Dimas Aguas, RN [facility] [phone]

## 2022-06-10 NOTE — Progress Notes (Signed)
  Chaplain On-Call received a referral from previous [place], who reported the patient's request for Advance Directives information.  Chaplain met the patient and his wife Todd Trujillo.  Todd Trujillo stated that the [place] documents are already in place, with their daughters being named as Health Care Agents.  Chaplain provided spiritual and emotional support.  Chaplain Pollyann Samples M.Div., [facility]

## 2022-06-11 DIAGNOSIS — F0390 Unspecified dementia without behavioral disturbance: Secondary | ICD-10-CM

## 2022-06-11 DIAGNOSIS — K6389 Other specified diseases of intestine: Secondary | ICD-10-CM

## 2022-06-11 LAB — CBC
HCT: 29.7 % — ABNORMAL LOW (ref 39.0–52.0)
Hemoglobin: 9.3 g/dL — ABNORMAL LOW (ref 13.0–17.0)
MCH: 28.8 pg (ref 26.0–34.0)
MCHC: 31.3 g/dL (ref 30.0–36.0)
MCV: 92 fL (ref 80.0–100.0)
Platelets: 340 10*3/uL (ref 150–400)
RBC: 3.23 MIL/uL — ABNORMAL LOW (ref 4.22–5.81)
RDW: 14.5 % (ref 11.5–15.5)
WBC: 9.6 10*3/uL (ref 4.0–10.5)
nRBC: 0 % (ref 0.0–0.2)

## 2022-06-11 MED ORDER — AMOXICILLIN-POT CLAVULANATE 875-125 MG PO TABS: 1.0000 | ORAL_TABLET | Freq: Two times a day (BID) | ORAL | 0 refills | Status: AC

## 2022-06-11 NOTE — Progress Notes (Signed)
[facility]) Hospital Liaison note:  Notified of request for [facility] services. Pt's daughter would like pt to go home with home health services. Will continue to follow for disposition.  Please call with any outpatient palliative questions or concerns.  Thank you for the opportunity to participate in this patient's care.  Thank you, Lorelee Market, LPN [facility] Liaison [phone]

## 2022-06-11 NOTE — Hospital Course (Signed)
Patient is a 88 year old male with past medical history of vascular dementia, previous CVA, hypertension and recurrent aspiration pneumonia plus suspected right colon malignancy that has not been worked up and suspected right renal cell carcinoma also not worked up who presented to the emergency room on 11/24 with vomiting and bloody diarrhea that started the night before.  Workup revealed acute colitis as well as signs of recurrent aspiration pneumonia.  Patient started on IV antibiotics and over the next several days improved.  By 11/27, white blood cell count normal and after discussion with family, patient felt to be stable to go home.

## 2022-06-11 NOTE — TOC Initial Note (Signed)
Transition of Care (TOC) - Initial/Assessment Note    Patient Details  Name: Todd Trujillo. MRN: [id number] Date of Birth: 04-May-1930  Transition of Care [facility]) CM/SW Contact:    Beverly Sessions, RN Phone Number: 06/11/2022, 1:08 PM  Clinical Narrative:                    Patient to discharge home today Family at bedside  to transport Patient is not opened with hospice yet, but plan was to open at discharge.   Per daughter Amy they would like home health services with [facility] prior to initiating hospice services  Referral made and accepted by Corene Cornea with [facility]  Family to transport at discharge and does not want EMS  Lorayne Bender with [employer] updated      Patient Goals and CMS Choice        Expected Discharge Plan and Services           Expected Discharge Date: 06/11/22                                    Prior Living Arrangements/Services                       Activities of Daily Living Home Assistive Devices/Equipment: Gilford Rile (specify type) (frontwheel) ADL Screening (condition at time of admission) Patient's cognitive ability adequate to safely complete daily activities?: Yes Is the patient deaf or have difficulty hearing?: No Does the patient have difficulty seeing, even when wearing glasses/contacts?: No Does the patient have difficulty concentrating, remembering, or making decisions?: Yes Patient able to express need for assistance with ADLs?: Yes Does the patient have difficulty dressing or bathing?: Yes Independently performs ADLs?: No Communication: Independent Dressing (OT): Needs assistance Is this a change from baseline?: Pre-admission baseline Grooming: Needs assistance Is this a change from baseline?: Pre-admission baseline Feeding: Needs assistance Is this a change from baseline?: Pre-admission baseline Bathing: Needs assistance Is this a change from baseline?: Pre-admission  baseline In/Out Bed: Needs assistance Is this a change from baseline?: Pre-admission baseline Walks in Home: Independent with device (comment) Does the patient have difficulty walking or climbing stairs?: Yes Weakness of Legs: Both Weakness of Arms/Hands: None  Permission Sought/Granted                  Emotional Assessment              Admission diagnosis:  Dehydration [E86.0] Tachycardia [R00.0] Acute colitis [K52.9] Nausea and vomiting, unspecified vomiting type [R11.2] Patient Active Problem List   Diagnosis Date Noted   Nausea and vomiting 06/09/2022   Acute colitis 06/08/2022   Hematuria 06/08/2022   Thickened nails 02/18/2022   Open wound of heel 10/21/2021   Skin lesion 10/21/2021   Cancer of right colon ([place]) 10/13/2021   Colonic mass 10/08/2021   Iron deficiency anemia 10/08/2021   Vascular dementia ([place]) 09/15/2021   Benign essential hypertension 09/15/2021   Cerebral infarction, unspecified (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   History of subdural hematoma 08/19/2021   Aspiration pneumonia ([place]) 06/11/2021   Dementia without behavioral disturbance ([place]) 06/11/2021   Right femoral fracture ([place]) 06/06/2021   Leucocytosis 06/06/2021   Obsessive behavior 03/11/2021   History of CVA (cerebrovascular accident) 09/21/2020   Renal mass 09/21/2020   Weakness 09/21/2020   Cerebral atrophy ([place]) 09/15/2020  Cerebrovascular accident (CVA) ([place])    Left facial numbness 09/12/2019   HTN (hypertension) 09/12/2019   GERD (gastroesophageal reflux disease) 09/12/2019   Arthritis 08/13/2018   Benign prostatic hyperplasia 08/13/2018   Diverticulosis 08/13/2018   History of anemia 08/13/2018   History of colonic polyps 08/13/2018   History of nephrolithiasis 08/13/2018   History of pneumonia 08/13/2018   Unintentional weight loss 12/15/2016   Healthcare maintenance 12/14/2016   Skin lesion of chest wall 09/23/2016    Symptomatic anemia 09/17/2016   Hypercholesterolemia 09/17/2016   Incomplete emptying of bladder 02/02/2016   TIA (transient ischemic attack) 01/08/2016   Macular degeneration 01/08/2016   History of prostate cancer 01/08/2016   PVD (peripheral vascular disease) ([place]) 10/12/2015   SCC (squamous cell carcinoma) 01/24/2015   Bladder outlet obstruction 04/15/2013   Acquired cyst of kidney 04/16/2012   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/2012   PCP:  Einar Pheasant, MD Pharmacy:   [place], [place] - [address] [place] [address] Phone: [phone] Fax: [place], [place] [address] Phone: [phone] Fax: [phone]     Social Determinants of Health ([place]) Interventions Housing Interventions: Patient Refused  Readmission Risk Interventions     No data to display

## 2022-06-11 NOTE — Plan of Care (Signed)
IV removed, discharge instructions reviewed with wife and daughters.  Patients driven home by family member and they were made aware of concerns I had with him transferring from wheelchair to car to home and they stated that they could manage.

## 2022-06-11 NOTE — Discharge Summary (Signed)
Physician Discharge Summary   Patient: Todd Trujillo. MRN: [id number] DOB: 10-14-1929  Admit date:     06/08/2022  Discharge date: 06/11/22  Discharge Physician: Annita Brod   PCP: Einar Pheasant, MD   Recommendations at discharge:   New medication: Augmentin 875 p.o. twice daily x 3 days At family's request, patient be discharged with home health physical therapy At family's request, patient's Zoloft and Seroquel are being discontinued.  See below.  Discharge Diagnoses: Principal Problem:   Acute colitis Active Problems:   Aspiration pneumonia ([place])   Colonic mass   Dementia without behavioral disturbance (HCC)   Hematuria   Nausea and vomiting  Resolved Problems:   * No resolved hospital problems. [facility] Course: Patient is a 88 year old male with past medical history of vascular dementia, previous CVA, hypertension and recurrent aspiration pneumonia plus suspected right colon malignancy that has not been worked up and suspected right renal cell carcinoma also not worked up who presented to the emergency room on 11/24 with vomiting and bloody diarrhea that started the night before.  Workup revealed acute colitis as well as signs of recurrent aspiration pneumonia.  Patient started on IV antibiotics and over the next several days improved.  By 11/27, white blood cell count normal and after discussion with family, patient felt to be stable to go home.  Assessment and Plan: * Acute colitis No evidence of obstruction.  Differential includes infectious colitis versus possibly ischemic, potentially secondary to adjacent colon mass.  Antibiotics given for aspiration pneumonia.  Symptoms resolved and patient able to tolerate p.o.  Aspiration pneumonia ([place]) Patient has a prior history of aspiration, now experiencing hypoxia in the setting of bibasilar opacities concerning for aspiration pneumonia.  On IV Rocephin.  By 11/27, white blood cell count normal.  Patient  discharged on p.o. Augmentin x 3 more days for total of 7 days of therapy.  Colonic mass Colonic mass discovered approximately 1 year ago and patient has followed up with gastroenterology, general surgery and oncology.  Due to advancing dementia and frail state, workup has been declined.  This is also the same for renal mass, suspected to be renal cell carcinoma.  Dementia without behavioral disturbance (HCC) Patient's mentation is currently at baseline per his daughter at bedside, although he is typically more vocal.  Patient's wife states that his mentation fluctuates.  Patient has not been on his Seroquel or Zoloft since admission due to vomiting of meds.  At family's request, they would like to discontinue these medications altogether.  ? Hematuria Patient's daughter states she noticed several drops of blood coming from the urethral opening today that has occasionally recurred throughout the day.  Patient has a known history of renal cell carcinoma.  He also has a history of BPH.  He is already established with urology.  Urinalysis done noted no evidence of hematuria  - Continue outpatient follow-up with urology - Urinalysis pending         Consultants: None Procedures performed: None Disposition: Home with home health Diet recommendation:  Discharge Diet Orders (From admission, onward)     Start     Ordered   06/11/22 0000  DIET - DYS 1       Question:  Fluid consistency:  Answer:  Thin   06/11/22 1229           Dysphagia 1 diet with thin liquids DISCHARGE MEDICATION: Allergies as of 06/11/2022       Reactions   Codeine Nausea  And Vomiting, Nausea Only   Other reaction(s): Vomiting        Medication List     STOP taking these medications    QUEtiapine 50 MG tablet Commonly known as: SEROQUEL   sertraline 25 MG tablet Commonly known as: ZOLOFT       TAKE these medications    acetaminophen 500 MG tablet Commonly known as: TYLENOL Take 500 mg by  mouth every 6 (six) hours as needed.   amoxicillin-clavulanate 875-125 MG tablet Commonly known as: AUGMENTIN Take 1 tablet by mouth 2 (two) times daily for 3 days.   aspirin EC 81 MG tablet Take 1 tablet (81 mg total) by mouth daily. Swallow whole.   cholecalciferol 25 MCG (1000 UNIT) tablet Commonly known as: VITAMIN D3 Take 1,000 Units by mouth daily.   ciprofloxacin 0.3 % ophthalmic solution Commonly known as: CILOXAN PLACE 1 DROP INTO BOTH EYES EVERY 6 HOURS   feeding supplement (NEPRO CARB STEADY) Liqd Take 237 mLs by mouth 2 (two) times daily between meals.        Discharge Exam: Filed Weights   06/08/22 1123 06/08/22 2327  Weight: 65.8 kg 67.7 kg   General: Oriented x 1, no acute distress Cardiovascular: Regular rate and rhythm, S1-S2 Lungs: Clear to auscultation bilaterally  Condition at discharge: fair  The results of significant diagnostics from this hospitalization (including imaging, microbiology, ancillary and laboratory) are listed below for reference.   Imaging Studies: CT Abdomen Pelvis W Contrast  Result Date: 06/08/2022 CLINICAL DATA:  Abdominal pain, nausea, vomiting and diarrhea, aspiration * Tracking Code: BO * EXAM: CT ABDOMEN AND PELVIS WITH CONTRAST TECHNIQUE: Multidetector CT imaging of the abdomen and pelvis was performed using the standard protocol following bolus administration of intravenous contrast. RADIATION DOSE REDUCTION: This exam was performed according to the departmental dose-optimization program which includes automated exposure control, adjustment of the mA and/or kV according to patient size and/or use of iterative reconstruction technique. CONTRAST:  158m OMNIPAQUE IOHEXOL 300 MG/ML  SOLN COMPARISON:  10/03/2021 FINDINGS: Lower chest: Coronary artery calcifications. Heterogeneous opacity, consolidation, and bronchiolar plugging in the left-greater-than-right bilateral lung bases (series 4, image 8). Trace pleural effusions.  Hepatobiliary: No solid liver abnormality is seen. No gallstones, gallbladder wall thickening, or biliary dilatation. Pancreas: Atrophic appearing pancreas. No pancreatic ductal dilatation or surrounding inflammatory changes. Spleen: Normal in size without significant abnormality. Adrenals/Urinary Tract: Adrenal glands are unremarkable. Unchanged mixed solid and cystic exophytic lesion arising from the superior pole of the left kidney, measuring 3.9 x 3.8 cm, again with a peripheral nodule anteriorly which apparently enhances, measuring 2.1 x 1.5 cm (series 2, image 20). Bladder is unremarkable. Stomach/Bowel: Stomach is within normal limits. Similar appearance of circumferential wall thickening involving the mid ascending colon (series 2, image 34). There is new associated long segment wall thickening involving the remainder of the cecum and ascending colon on today's examination (series 2, image 41, series 5, image 34). Normal appendix. Sigmoid diverticula. Vascular/Lymphatic: Aortic atherosclerosis. No enlarged abdominal or pelvic lymph nodes. Reproductive: Prostate brachytherapy. Median lobe hypertrophy (series 6, image 69). Other: No abdominal wall hernia or abnormality. No ascites. Musculoskeletal: No acute osseous findings. Status post intramedullary nail fixation of the proximal right femur. Unchanged wedge deformities of L1 and L3. IMPRESSION: 1. Heterogeneous opacity, consolidation, and bronchiolar plugging in the left-greater-than-right bilateral lung bases, consistent with reported history of aspiration. Trace pleural effusions. 2. Similar appearance of circumferential wall thickening involving the mid ascending colon, highly suspicious for primary colon malignancy.  3. New associated long segment wall thickening involving the remainder of the cecum and ascending colon on today's examination, suggesting nonspecific infectious or inflammatory colitis. 4. Unchanged mixed solid and cystic exophytic lesion  arising from the superior pole of the left kidney, remaining highly suspicious for a cystic renal cell carcinoma. 5. No evidence of lymphadenopathy or metastatic disease in the abdomen or pelvis. 6. Coronary artery disease. 7. Prostate brachytherapy. Aortic Atherosclerosis (ICD10-I70.0). Electronically Signed   By: Delanna Ahmadi M.D.   On: 06/08/2022 15:22   DG Chest 2 View  Result Date: 06/08/2022 CLINICAL DATA:  88 year old male with question of aspiration. EXAM: CHEST - 2 VIEW COMPARISON:  June 06, 2021. FINDINGS: AP projection rotated to the LEFT. Accounting for this cardiomediastinal contours and hilar structures are stable. There is no sign of lobar consolidative change. Mild prominence of interstitium at the LEFT and RIGHT lung base. No pneumothorax. Potential trace pleural effusions. Lateral projection limited by patient condition. No acute or destructive bone findings on limited assessment. IMPRESSION: Mild prominence of interstitium at the LEFT and RIGHT lung base. Findings may be related to chronic changes from prior aspiration and are much improved compared to more remote imaging. Attention on follow-up is suggested if there is continued concern for a changes related to acute aspiration event. Potential trace bilateral pleural effusions. Electronically Signed   By: Zetta Bills M.D.   On: 06/08/2022 12:10    Microbiology: Results for orders placed or performed during the hospital encounter of 06/08/22  Resp Panel by RT-PCR (Flu A&B, Covid) Anterior Nasal Swab     Status: None   Collection Time: 06/08/22  1:52 PM   Specimen: Anterior Nasal Swab  Result Value Ref Range Status   SARS Coronavirus 2 by RT PCR NEGATIVE NEGATIVE Final    Comment: (NOTE) SARS-CoV-2 target nucleic acids are NOT DETECTED.  The SARS-CoV-2 RNA is generally detectable in upper respiratory specimens during the acute phase of infection. The lowest concentration of SARS-CoV-2 viral copies this assay can detect  is 138 copies/mL. A negative result does not preclude SARS-Cov-2 infection and should not be used as the sole basis for treatment or other patient management decisions. A negative result may occur with  improper specimen collection/handling, submission of specimen other than nasopharyngeal swab, presence of viral mutation(s) within the areas targeted by this assay, and inadequate number of viral copies(<138 copies/mL). A negative result must be combined with clinical observations, patient history, and epidemiological information. The expected result is Negative.  Fact Sheet for Patients:  EntrepreneurPulse.com.au  Fact Sheet for Healthcare Providers:  IncredibleEmployment.be  This test is no t yet approved or cleared by the [place] FDA and  has been authorized for detection and/or diagnosis of SARS-CoV-2 by FDA under an Emergency Use Authorization (EUA). This EUA will remain  in effect (meaning this test can be used) for the duration of the COVID-19 declaration under Section 564(b)(1) of the Act, 21 U.S.C.section 360bbb-3(b)(1), unless the authorization is terminated  or revoked sooner.       Influenza A by PCR NEGATIVE NEGATIVE Final   Influenza B by PCR NEGATIVE NEGATIVE Final    Comment: (NOTE) The Xpert Xpress SARS-CoV-2/FLU/RSV plus assay is intended as an aid in the diagnosis of influenza from Nasopharyngeal swab specimens and should not be used as a sole basis for treatment. Nasal washings and aspirates are unacceptable for Xpert Xpress SARS-CoV-2/FLU/RSV testing.  Fact Sheet for Patients: EntrepreneurPulse.com.au  Fact Sheet for Healthcare Providers: IncredibleEmployment.be  This  test is not yet approved or cleared by the [place] and has been authorized for detection and/or diagnosis of SARS-CoV-2 by FDA under an Emergency Use Authorization (EUA). This EUA will remain in effect  (meaning this test can be used) for the duration of the COVID-19 declaration under Section 564(b)(1) of the Act, 21 U.S.C. section 360bbb-3(b)(1), unless the authorization is terminated or revoked.  Performed at [facility], [address] [place], [place] [address]   Blood Culture (routine x 2)     Status: None (Preliminary result)   Collection Time: 06/08/22  1:52 PM   Specimen: BLOOD  Result Value Ref Range Status   Specimen Description BLOOD RIGHT [facility]  Final   Special Requests   Final    BOTTLES DRAWN AEROBIC AND ANAEROBIC Blood Culture adequate volume   Culture   Final    NO GROWTH 3 DAYS Performed at [facility], [address], [place], [place] [address]    Report Status PENDING  Incomplete  Blood Culture (routine x 2)     Status: None (Preliminary result)   Collection Time: 06/08/22  1:52 PM   Specimen: BLOOD  Result Value Ref Range Status   Specimen Description BLOOD LEFT [facility]  Final   Special Requests   Final    BOTTLES DRAWN AEROBIC AND ANAEROBIC Blood Culture adequate volume   Culture   Final    NO GROWTH 3 DAYS Performed at [facility], [address] [place] [address], [place], [place] [address]    Report Status PENDING  Incomplete  Urine Culture     Status: None   Collection Time: 06/08/22 11:00 PM   Specimen: In/Out Cath Urine  Result Value Ref Range Status   Specimen Description   Final    IN/OUT CATH URINE Performed at [facility], [address] [place], [place] [address]    Special Requests   Final    NONE Performed at [facility], [address], [place], [place] [address]    Culture   Final    NO GROWTH Performed at [facility], [place] [address] [place], [place] [address]    Report Status 06/10/2022 FINAL  Final    Labs: CBC: Recent Labs  Lab 06/08/22 1123 06/09/22 0637 06/10/22 0830 06/11/22 0841  WBC 20.7* 17.3* 15.5* 9.6  NEUTROABS 17.6*  --   --   --   HGB 11.3* 9.2* 9.3* 9.3*  HCT 37.0*  29.1* 30.2* 29.7*  MCV 92.0 92.4 92.6 92.0  PLT 480* 362 373 620   Basic Metabolic Panel: Recent Labs  Lab 06/08/22 1123 06/09/22 0637  NA 144 143  K 4.7 3.5  CL 102 107  CO2 29 29  GLUCOSE 165* 122*  BUN 24* 18  CREATININE 1.06 0.78  CALCIUM 10.0 8.9   Liver Function Tests: Recent Labs  Lab 06/08/22 1123 06/09/22 0637  AST 19 20  ALT 8 7  ALKPHOS 97 78  BILITOT 1.0 1.0  PROT 7.8 6.1*  ALBUMIN 3.7 3.0*   CBG: No results for input(s): "GLUCAP" in the last 168 hours.  Discharge time spent: less than 30 minutes.  Signed: Annita Brod, MD [facility] 06/11/2022

## 2022-06-12 ENCOUNTER — Telehealth: Payer: Self-pay

## 2022-06-12 ENCOUNTER — Telehealth: Payer: Self-pay | Admitting: Internal Medicine

## 2022-06-12 NOTE — Telephone Encounter (Signed)
Pt daughter called wanting to set up a hospital follow-up for the pt and they want to do it virtually. There are no appointments within the time frame

## 2022-06-12 NOTE — Telephone Encounter (Signed)
Transition Care Management Unsuccessful Follow-up Telephone Call  Date of discharge and from where:  TCM DC [facility] 06-11-22 Dx: acute colitis  Attempts:  1st Attempt  Reason for unsuccessful TCM follow-up call:  Left voice message   Juanda Crumble LPN [place] Direct Dial [phone]

## 2022-06-13 LAB — CULTURE, BLOOD (ROUTINE X 2)
Culture: NO GROWTH
Culture: NO GROWTH
Special Requests: ADEQUATE
Special Requests: ADEQUATE

## 2022-06-13 NOTE — Telephone Encounter (Signed)
Transition Care Management Follow-up Telephone Call Date of discharge and from where: TCM DC [facility] 06-11-22 Dx: acute colitis  How have you been since you were released from the hospital? Super weak  Any questions or concerns? No  Items Reviewed: Did the pt receive and understand the discharge instructions provided? Yes  Medications obtained and verified? Yes  Other? No  Any new allergies since your discharge? No  Dietary orders reviewed? Yes Do you have support at home? Yes   Home Care and Equipment/Supplies: Were home health services ordered? Yes ST/ PT If so, what is the name of the agency? [facility] set up a time to come to the patient's home? yes Were any new equipment or medical supplies ordered?  No What is the name of the medical supply agency? na Were you able to get the supplies/equipment? not applicable Do you have any questions related to the use of the equipment or supplies? No  Functional Questionnaire: (I = Independent and D = Dependent) ADLs: D  Bathing/Dressing-D   Meal Prep- D  Eating- I  Maintaining continence- D  Transferring/Ambulation- D  Managing Meds- D-DAUGHTER MANAGES   Follow up appointments reviewed:  PCP Hospital f/u appt confirmed? Yes  Scheduled to see Dr Nicki Reaper on 06-21-22 @ 1130am virtually . [facility] f/u appt confirmed? No  . Are transportation arrangements needed? No  If their condition worsens, is the pt aware to call PCP or go to the Emergency Dept.? Yes Was the patient provided with contact information for the PCP's office or ED? Yes Was to pt encouraged to call back with questions or concerns? Yes   Juanda Crumble LPN [place] Direct Dial [phone]

## 2022-06-15 ENCOUNTER — Telehealth: Payer: Self-pay

## 2022-06-15 NOTE — Telephone Encounter (Signed)
PC SW outreached patients daughter, Darius Bump, to schedule PC hosptial follow up visit.   Daughter declined PC services at this time. Per daughter their plan is once patient completes [place] he will transition to hospice. Daughter states she will outreach PC should this change.  Patient will be Dc'd from [facility].

## 2022-06-21 ENCOUNTER — Encounter: Payer: Self-pay | Admitting: Internal Medicine

## 2022-06-21 ENCOUNTER — Telehealth (INDEPENDENT_AMBULATORY_CARE_PROVIDER_SITE_OTHER): Payer: Medicare Other | Admitting: Internal Medicine

## 2022-06-21 VITALS — BP 112/62 | HR 87 | Temp 97.0°F

## 2022-06-21 DIAGNOSIS — F0390 Unspecified dementia without behavioral disturbance: Secondary | ICD-10-CM | POA: Diagnosis not present

## 2022-06-21 DIAGNOSIS — G319 Degenerative disease of nervous system, unspecified: Secondary | ICD-10-CM

## 2022-06-21 DIAGNOSIS — K6389 Other specified diseases of intestine: Secondary | ICD-10-CM | POA: Diagnosis not present

## 2022-06-21 DIAGNOSIS — Z8701 Personal history of pneumonia (recurrent): Secondary | ICD-10-CM

## 2022-06-21 DIAGNOSIS — E7800 Pure hypercholesterolemia, unspecified: Secondary | ICD-10-CM

## 2022-06-21 DIAGNOSIS — R21 Rash and other nonspecific skin eruption: Secondary | ICD-10-CM

## 2022-06-21 DIAGNOSIS — C182 Malignant neoplasm of ascending colon: Secondary | ICD-10-CM

## 2022-06-21 DIAGNOSIS — N2889 Other specified disorders of kidney and ureter: Secondary | ICD-10-CM

## 2022-06-21 DIAGNOSIS — D509 Iron deficiency anemia, unspecified: Secondary | ICD-10-CM

## 2022-06-21 DIAGNOSIS — Z8673 Personal history of transient ischemic attack (TIA), and cerebral infarction without residual deficits: Secondary | ICD-10-CM

## 2022-06-21 DIAGNOSIS — R112 Nausea with vomiting, unspecified: Secondary | ICD-10-CM

## 2022-06-21 MED ORDER — PIMECROLIMUS 1 % EX CREA: TOPICAL_CREAM | Freq: Two times a day (BID) | CUTANEOUS | 0 refills | Status: AC

## 2022-06-21 NOTE — Progress Notes (Deleted)
Patient ID: [name]., male   DOB: 04/16/1930, 88 y.o.   MRN: [id number]   Subjective:    Patient ID: [name]., male    DOB: [birth year], 88 y.o.   MRN: [id number]  This visit occurred during the SARS-CoV-2 public health emergency.  Safety protocols were in place, including screening questions prior to the visit, additional usage of staff PPE, and extensive cleaning of exam room while observing appropriate contact time as indicated for disinfecting solutions.   Patient here for  Chief Complaint  Patient presents with   Hospitalization Follow-up   .   HPI    Past Medical History:  Diagnosis Date   Arthritis    Cancer ([place])    prostate   Hyperlipidemia    Hypertension    Left wrist fracture    Macular degeneration of right eye    Nephrolithiasis    Prostate CA ([place]) 2003   Prostate troubles    Patient was unsure of term   Squamous cell carcinoma of skin 01/23/2017   R distal lat bicep near elbow   Squamous cell carcinoma of skin 03/05/2022   L forearm posterior - tx with ED&C   Past Surgical History:  Procedure Laterality Date   INTRAMEDULLARY (IM) NAIL INTERTROCHANTERIC Right 06/07/2021   Procedure: INTRAMEDULLARY (IM) NAIL INTERTROCHANTRIC;  Surgeon: Thornton Park, MD;  Location: [facility] ORS;  Service: Orthopedics;  Laterality: Right;   TOTAL KNEE ARTHROPLASTY Right    Family History  Problem Relation Age of Onset   Colon cancer Mother    Esophageal cancer Brother    Breast cancer Daughter    Social History   Socioeconomic History   Marital status: Married    Spouse name: Not on file   Number of children: Not on file   Years of education: Not on file   Highest education level: Not on file  Occupational History   Not on file  Tobacco Use   Smoking status: Never    Passive exposure: Never   Smokeless tobacco: Never  Vaping Use   Vaping Use: Never used  Substance and Sexual Activity   Alcohol use: No   Drug use: Not  Currently   Sexual activity: Not on file  Other Topics Concern   Not on file  Social History Narrative   Patient lives at this wife; daughter; disabled granddaughter.  Limited mobility.  No smoking or alcohol.    Social Determinants of Health   Financial Resource Strain: Not on file  Food Insecurity: No Food Insecurity (06/08/2022)   Hunger Vital Sign    Worried About Running Out of Food in the Last Year: Never true    Ran Out of Food in the Last Year: Never true  Transportation Needs: Unmet Transportation Needs (06/08/2022)   PRAPARE - Hydrologist (Medical): Yes    Lack of Transportation (Non-Medical): Yes  Physical Activity: Not on file  Stress: Not on file  Social Connections: Not on file     Review of Systems     Objective:     BP 112/62   Pulse 87   Temp (!) 97 F (36.1 C) (Oral)   SpO2 99%  Wt Readings from Last 3 Encounters:  06/08/22 149 lb 4 oz (67.7 kg)  02/16/22 147 lb 6.4 oz (66.9 kg)  02/07/22 145 lb (65.8 kg)    Physical Exam   Outpatient Encounter Medications as of 06/21/2022  Medication Sig   aspirin EC 81  MG tablet Take 1 tablet (81 mg total) by mouth daily. Swallow whole.   cholecalciferol (VITAMIN D3) 25 MCG (1000 UNIT) tablet Take 1,000 Units by mouth daily.   ciprofloxacin (CILOXAN) 0.3 % ophthalmic solution PLACE 1 DROP INTO BOTH EYES EVERY 6 HOURS   Nutritional Supplements (FEEDING SUPPLEMENT, NEPRO CARB STEADY,) LIQD Take 237 mLs by mouth 2 (two) times daily between meals.   acetaminophen (TYLENOL) 500 MG tablet Take 500 mg by mouth every 6 (six) hours as needed. (Patient not taking: Reported on 06/21/2022)   sertraline (ZOLOFT) 25 MG tablet Take 25 mg by mouth daily. (Patient not taking: Reported on 06/13/2022)   No facility-administered encounter medications on file as of 06/21/2022.     Lab Results  Component Value Date   WBC 9.6 06/11/2022   HGB 9.3 (L) 06/11/2022   HCT 29.7 (L) 06/11/2022   PLT 340  06/11/2022   GLUCOSE 122 (H) 06/09/2022   CHOL 183 08/18/2021   TRIG 134.0 08/18/2021   HDL 40.20 08/18/2021   LDLCALC 116 (H) 08/18/2021   ALT 7 06/09/2022   AST 20 06/09/2022   NA 143 06/09/2022   K 3.5 06/09/2022   CL 107 06/09/2022   CREATININE 0.78 06/09/2022   BUN 18 06/09/2022   CO2 29 06/09/2022   TSH 2.03 08/18/2021   INR 1.5 (H) 06/06/2021   HGBA1C 5.5 09/13/2019    CT Abdomen Pelvis W Contrast  Result Date: 06/08/2022 CLINICAL DATA:  Abdominal pain, nausea, vomiting and diarrhea, aspiration * Tracking Code: BO * EXAM: CT ABDOMEN AND PELVIS WITH CONTRAST TECHNIQUE: Multidetector CT imaging of the abdomen and pelvis was performed using the standard protocol following bolus administration of intravenous contrast. RADIATION DOSE REDUCTION: This exam was performed according to the departmental dose-optimization program which includes automated exposure control, adjustment of the mA and/or kV according to patient size and/or use of iterative reconstruction technique. CONTRAST:  155m OMNIPAQUE IOHEXOL 300 MG/ML  SOLN COMPARISON:  10/03/2021 FINDINGS: Lower chest: Coronary artery calcifications. Heterogeneous opacity, consolidation, and bronchiolar plugging in the left-greater-than-right bilateral lung bases (series 4, image 8). Trace pleural effusions. Hepatobiliary: No solid liver abnormality is seen. No gallstones, gallbladder wall thickening, or biliary dilatation. Pancreas: Atrophic appearing pancreas. No pancreatic ductal dilatation or surrounding inflammatory changes. Spleen: Normal in size without significant abnormality. Adrenals/Urinary Tract: Adrenal glands are unremarkable. Unchanged mixed solid and cystic exophytic lesion arising from the superior pole of the left kidney, measuring 3.9 x 3.8 cm, again with a peripheral nodule anteriorly which apparently enhances, measuring 2.1 x 1.5 cm (series 2, image 20). Bladder is unremarkable. Stomach/Bowel: Stomach is within normal  limits. Similar appearance of circumferential wall thickening involving the mid ascending colon (series 2, image 34). There is new associated long segment wall thickening involving the remainder of the cecum and ascending colon on today's examination (series 2, image 41, series 5, image 34). Normal appendix. Sigmoid diverticula. Vascular/Lymphatic: Aortic atherosclerosis. No enlarged abdominal or pelvic lymph nodes. Reproductive: Prostate brachytherapy. Median lobe hypertrophy (series 6, image 69). Other: No abdominal wall hernia or abnormality. No ascites. Musculoskeletal: No acute osseous findings. Status post intramedullary nail fixation of the proximal right femur. Unchanged wedge deformities of L1 and L3. IMPRESSION: 1. Heterogeneous opacity, consolidation, and bronchiolar plugging in the left-greater-than-right bilateral lung bases, consistent with reported history of aspiration. Trace pleural effusions. 2. Similar appearance of circumferential wall thickening involving the mid ascending colon, highly suspicious for primary colon malignancy. 3. New associated long segment wall thickening involving the  remainder of the cecum and ascending colon on today's examination, suggesting nonspecific infectious or inflammatory colitis. 4. Unchanged mixed solid and cystic exophytic lesion arising from the superior pole of the left kidney, remaining highly suspicious for a cystic renal cell carcinoma. 5. No evidence of lymphadenopathy or metastatic disease in the abdomen or pelvis. 6. Coronary artery disease. 7. Prostate brachytherapy. Aortic Atherosclerosis (ICD10-I70.0). Electronically Signed   By: Delanna Ahmadi M.D.   On: 06/08/2022 15:22   DG Chest 2 View  Result Date: 06/08/2022 CLINICAL DATA:  88 year old male with question of aspiration. EXAM: CHEST - 2 VIEW COMPARISON:  June 06, 2021. FINDINGS: AP projection rotated to the LEFT. Accounting for this cardiomediastinal contours and hilar structures are  stable. There is no sign of lobar consolidative change. Mild prominence of interstitium at the LEFT and RIGHT lung base. No pneumothorax. Potential trace pleural effusions. Lateral projection limited by patient condition. No acute or destructive bone findings on limited assessment. IMPRESSION: Mild prominence of interstitium at the LEFT and RIGHT lung base. Findings may be related to chronic changes from prior aspiration and are much improved compared to more remote imaging. Attention on follow-up is suggested if there is continued concern for a changes related to acute aspiration event. Potential trace bilateral pleural effusions. Electronically Signed   By: Zetta Bills M.D.   On: 06/08/2022 12:10       Assessment & Plan:   Problem List Items Addressed This Visit   None    Einar Pheasant, MD

## 2022-07-09 ENCOUNTER — Encounter: Payer: Self-pay | Admitting: Internal Medicine

## 2022-07-09 DIAGNOSIS — R21 Rash and other nonspecific skin eruption: Secondary | ICD-10-CM | POA: Insufficient documentation

## 2022-07-09 NOTE — Assessment & Plan Note (Signed)
Colonic mass discovered approximately one year ago and pt has followed up with gastroenterology, general surgery and oncology.  Have elected to hold on further w/up or evaluation.

## 2022-07-09 NOTE — Assessment & Plan Note (Signed)
Has seen neurology.  Appears to be stable currently.  Follow.

## 2022-07-09 NOTE — Assessment & Plan Note (Signed)
History of right thalamic infarct.  Continue aspirin.  Completed course of plavix.  Continue risk factor modification. Will continue aspirin, given history.

## 2022-07-09 NOTE — Assessment & Plan Note (Signed)
Noted on previous scan.  Saw neurology.  Off seroquel.  Follow

## 2022-07-09 NOTE — Assessment & Plan Note (Signed)
Evaluated by surgery.  Currently eating. Bowels moving.  Follow.  Elected no further intervention.

## 2022-07-09 NOTE — Assessment & Plan Note (Signed)
Admitted with nausea/vomiting as outlined.  Treated with IVFs and abx.  Weakness.  Home health PT.  Follow.

## 2022-07-09 NOTE — Progress Notes (Signed)
Patient ID: Todd Trujillo., male   DOB: 04-27-1930, 88 y.o.   MRN: [id number]   Virtual Visit via video Note   All issues noted in this document were discussed and addressed.  No physical exam was performed (except for noted visual exam findings with Video Visits).   I connected with Todd Trujillo by a video enabled telemedicine application and verified that I am speaking with the correct person using two identifiers. Location patient: home Location provider: work  Persons participating in the virtual visit: patient, provider and pts daughters.   The limitations, risks, security and privacy concerns of performing an evaluation and management service by video and the availability of in person appointments have been discussed.  It has also been discussed with the patient that there may be a patient responsible charge related to this service. The patient's family expressed understanding and agreed to proceed.   Reason for visit: hospital follow up.   HPI: Admitted 06/08/22 - 06/11/22 - after presenting with vomiting and bloody diarrhea.  W/up revealed acute colitis as well as signs of recurrent aspiration pneumonia.  Started on IV antibiotics.  By 11/27 - white blood cell count - normal.  Tolerating po's.  Discharged on po augmentin.  Continue to decline further w/up for colonic mass and renal mass.  Was off zoloft and seroquel in hospital and family requested to to discontinue these medications altogether.  Concern regarding possible hematuria.  Urine ok.  Following with urology.  Recommended discharged with home health and PT.  No further emesis.  More solid stool.  No blood.  PT working with him.  Using walker.  Lesion - right cheek.   Rash - around mouth.     ROS: See pertinent positives and negatives per HPI.  Past Medical History:  Diagnosis Date   Arthritis    Cancer [facility])    prostate   Hyperlipidemia    Hypertension    Left wrist fracture    Macular degeneration of  right eye    Nephrolithiasis    Prostate CA ([place]) 2003   Prostate troubles    Patient was unsure of term   Squamous cell carcinoma of skin 01/23/2017   R distal lat bicep near elbow   Squamous cell carcinoma of skin 03/05/2022   L forearm posterior - tx with ED&C    Past Surgical History:  Procedure Laterality Date   INTRAMEDULLARY (IM) NAIL INTERTROCHANTERIC Right 06/07/2021   Procedure: INTRAMEDULLARY (IM) NAIL INTERTROCHANTRIC;  Surgeon: Thornton Park, MD;  Location: [facility] ORS;  Service: Orthopedics;  Laterality: Right;   TOTAL KNEE ARTHROPLASTY Right     Family History  Problem Relation Age of Onset   Colon cancer Mother    Esophageal cancer Brother    Breast cancer Daughter     SOCIAL HX: reviewed.    Current Outpatient Medications:    aspirin EC 81 MG tablet, Take 1 tablet (81 mg total) by mouth daily. Swallow whole., Disp: 30 tablet, Rfl: 0   cholecalciferol (VITAMIN D3) 25 MCG (1000 UNIT) tablet, Take 1,000 Units by mouth daily., Disp: , Rfl:    ciprofloxacin (CILOXAN) 0.3 % ophthalmic solution, PLACE 1 DROP INTO BOTH EYES EVERY 6 HOURS, Disp: 5 mL, Rfl: 0   Nutritional Supplements (FEEDING SUPPLEMENT, NEPRO CARB STEADY,) LIQD, Take 237 mLs by mouth 2 (two) times daily between meals., Disp: , Rfl: 0   pimecrolimus (ELIDEL) 1 % cream, Apply topically 2 (two) times daily., Disp: 30 g, Rfl: 0   acetaminophen (  TYLENOL) 500 MG tablet, Take 500 mg by mouth every 6 (six) hours as needed. (Patient not taking: Reported on 06/21/2022), Disp: , Rfl:   EXAM:  GENERAL: alert, oriented, appears well and in no acute distress  HEENT: atraumatic, conjunttiva clear, no obvious abnormalities on inspection of external nose and ears  NECK: normal movements of the head and neck  LUNGS: on inspection no signs of respiratory distress, breathing rate appears normal, no obvious gross SOB, gasping or wheezing  CV: no obvious cyanosis  PSYCH/NEURO: pleasant and cooperative, no obvious  depression or anxiety, speech and thought processing grossly intact  ASSESSMENT AND PLAN:  Discussed the following assessment and plan:  Problem List Items Addressed This Visit     Cancer of right colon ([place]) - Primary    Evaluated by surgery.  Currently eating. Bowels moving.  Follow.  Elected no further intervention.       Cerebral atrophy ([place])    Noted on previous scan.  Saw neurology.  Off seroquel.  Follow         Colonic mass    Colonic mass discovered approximately one year ago and pt has followed up with gastroenterology, general surgery and oncology.  Have elected to hold on further w/up or evaluation.       Dementia without behavioral disturbance ([place])    Has seen neurology.  Appears to be stable currently.  Follow.       History of CVA (cerebrovascular accident)    History of right thalamic infarct.  Continue aspirin.  Completed course of plavix.  Continue risk factor modification. Will continue aspirin, given history.        History of pneumonia    Presumed aspiration pneumonia in hospital.  No increased sob.  Repeat cxr to confirm clear.       Hypercholesterolemia    Intolerance documented to statins.  Follow.       Iron deficiency anemia    Has seen hematology.  Has received iron infusions.  Follow cbc.       Nausea and vomiting    Admitted with nausea/vomiting as outlined.  Treated with IVFs and abx.  Weakness.  Home health PT.  Follow.        Rash    Elidel cream.  Follow.       Renal mass    Has seen urology.  Likely RCC. Elected no further evaluation/work up.        Return if symptoms worsen or fail to improve.   I discussed the assessment and treatment plan with the patient. The patient was provided an opportunity to ask questions and all were answered. The patient agreed with the plan and demonstrated an understanding of the instructions.   The patient was advised to call back or seek an in-person evaluation if the symptoms worsen or if  the condition fails to improve as anticipated.    Einar Pheasant, MD

## 2022-07-09 NOTE — Assessment & Plan Note (Signed)
Has seen urology.  Likely RCC. Elected no further evaluation/work up.

## 2022-07-09 NOTE — Assessment & Plan Note (Signed)
Elidel cream.  Follow.

## 2022-07-09 NOTE — Assessment & Plan Note (Signed)
Intolerance documented to statins.  Follow.

## 2022-07-09 NOTE — Assessment & Plan Note (Signed)
Presumed aspiration pneumonia in hospital.  No increased sob.  Repeat cxr to confirm clear.

## 2022-07-09 NOTE — Assessment & Plan Note (Signed)
Has seen hematology.  Has received iron infusions.  Follow cbc.

## 2022-07-13 ENCOUNTER — Telehealth: Payer: Self-pay | Admitting: Internal Medicine

## 2022-07-13 NOTE — Telephone Encounter (Signed)
Patient's daughter Marliss Czar called stating that the patient developed aspirated pneumonia over thanksgiving ,due to vomiting. She stated that the patient vomited last night and she is requesting an antibiotic for pneumonia just in case the patient develops any symptoms of pneumonia over the weekend due to the holiday.

## 2022-07-14 ENCOUNTER — Other Ambulatory Visit: Payer: Self-pay | Admitting: Internal Medicine

## 2022-07-17 ENCOUNTER — Inpatient Hospital Stay
Admission: EM | Admit: 2022-07-17 | Discharge: 2022-07-23 | DRG: 871 | Disposition: A | Discharge status: Hospice | Payer: Medicare Other | Attending: Internal Medicine | Admitting: Internal Medicine

## 2022-07-17 ENCOUNTER — Emergency Department: Payer: Medicare Other

## 2022-07-17 ENCOUNTER — Encounter: Payer: Self-pay | Admitting: Emergency Medicine

## 2022-07-17 ENCOUNTER — Other Ambulatory Visit: Payer: Self-pay

## 2022-07-17 DIAGNOSIS — Z7982 Long term (current) use of aspirin: Secondary | ICD-10-CM

## 2022-07-17 DIAGNOSIS — Z85828 Personal history of other malignant neoplasm of skin: Secondary | ICD-10-CM | POA: Diagnosis not present

## 2022-07-17 DIAGNOSIS — I69318 Other symptoms and signs involving cognitive functions following cerebral infarction: Secondary | ICD-10-CM | POA: Diagnosis not present

## 2022-07-17 DIAGNOSIS — A419 Sepsis, unspecified organism: Secondary | ICD-10-CM | POA: Diagnosis not present

## 2022-07-17 DIAGNOSIS — Z515 Encounter for palliative care: Secondary | ICD-10-CM

## 2022-07-17 DIAGNOSIS — Z66 Do not resuscitate: Secondary | ICD-10-CM | POA: Diagnosis present

## 2022-07-17 DIAGNOSIS — I5032 Chronic diastolic (congestive) heart failure: Secondary | ICD-10-CM | POA: Diagnosis present

## 2022-07-17 DIAGNOSIS — Z8546 Personal history of malignant neoplasm of prostate: Secondary | ICD-10-CM | POA: Diagnosis not present

## 2022-07-17 DIAGNOSIS — F0390 Unspecified dementia without behavioral disturbance: Secondary | ICD-10-CM | POA: Diagnosis not present

## 2022-07-17 DIAGNOSIS — R627 Adult failure to thrive: Secondary | ICD-10-CM | POA: Diagnosis present

## 2022-07-17 DIAGNOSIS — H3530 Unspecified macular degeneration: Secondary | ICD-10-CM | POA: Diagnosis present

## 2022-07-17 DIAGNOSIS — J690 Pneumonitis due to inhalation of food and vomit: Secondary | ICD-10-CM

## 2022-07-17 DIAGNOSIS — J9601 Acute respiratory failure with hypoxia: Secondary | ICD-10-CM | POA: Diagnosis present

## 2022-07-17 DIAGNOSIS — I110 Hypertensive heart disease with heart failure: Secondary | ICD-10-CM | POA: Diagnosis present

## 2022-07-17 DIAGNOSIS — E43 Unspecified severe protein-calorie malnutrition: Secondary | ICD-10-CM | POA: Diagnosis present

## 2022-07-17 DIAGNOSIS — Z885 Allergy status to narcotic agent status: Secondary | ICD-10-CM | POA: Diagnosis not present

## 2022-07-17 DIAGNOSIS — K922 Gastrointestinal hemorrhage, unspecified: Secondary | ICD-10-CM | POA: Diagnosis not present

## 2022-07-17 DIAGNOSIS — K921 Melena: Secondary | ICD-10-CM | POA: Diagnosis not present

## 2022-07-17 DIAGNOSIS — D62 Acute posthemorrhagic anemia: Secondary | ICD-10-CM | POA: Diagnosis not present

## 2022-07-17 DIAGNOSIS — Z800 Family history of malignant neoplasm of digestive organs: Secondary | ICD-10-CM

## 2022-07-17 DIAGNOSIS — R112 Nausea with vomiting, unspecified: Secondary | ICD-10-CM | POA: Diagnosis present

## 2022-07-17 DIAGNOSIS — F0150 Vascular dementia without behavioral disturbance: Secondary | ICD-10-CM | POA: Diagnosis present

## 2022-07-17 DIAGNOSIS — T380X5A Adverse effect of glucocorticoids and synthetic analogues, initial encounter: Secondary | ICD-10-CM | POA: Diagnosis not present

## 2022-07-17 DIAGNOSIS — K219 Gastro-esophageal reflux disease without esophagitis: Secondary | ICD-10-CM | POA: Diagnosis present

## 2022-07-17 DIAGNOSIS — I10 Essential (primary) hypertension: Secondary | ICD-10-CM | POA: Diagnosis not present

## 2022-07-17 DIAGNOSIS — Z79899 Other long term (current) drug therapy: Secondary | ICD-10-CM

## 2022-07-17 DIAGNOSIS — J1282 Pneumonia due to coronavirus disease 2019: Secondary | ICD-10-CM | POA: Diagnosis present

## 2022-07-17 DIAGNOSIS — J069 Acute upper respiratory infection, unspecified: Secondary | ICD-10-CM | POA: Diagnosis not present

## 2022-07-17 DIAGNOSIS — R1310 Dysphagia, unspecified: Secondary | ICD-10-CM | POA: Diagnosis present

## 2022-07-17 DIAGNOSIS — Z8673 Personal history of transient ischemic attack (TIA), and cerebral infarction without residual deficits: Secondary | ICD-10-CM | POA: Diagnosis not present

## 2022-07-17 DIAGNOSIS — E785 Hyperlipidemia, unspecified: Secondary | ICD-10-CM | POA: Diagnosis present

## 2022-07-17 DIAGNOSIS — Z96651 Presence of right artificial knee joint: Secondary | ICD-10-CM | POA: Diagnosis present

## 2022-07-17 DIAGNOSIS — Z803 Family history of malignant neoplasm of breast: Secondary | ICD-10-CM | POA: Diagnosis not present

## 2022-07-17 DIAGNOSIS — U071 COVID-19: Secondary | ICD-10-CM | POA: Diagnosis not present

## 2022-07-17 DIAGNOSIS — F0280 Dementia in other diseases classified elsewhere without behavioral disturbance: Secondary | ICD-10-CM | POA: Diagnosis not present

## 2022-07-17 DIAGNOSIS — R6520 Severe sepsis without septic shock: Secondary | ICD-10-CM | POA: Diagnosis present

## 2022-07-17 DIAGNOSIS — A4189 Other specified sepsis: Secondary | ICD-10-CM | POA: Diagnosis present

## 2022-07-17 DIAGNOSIS — Z6820 Body mass index (BMI) 20.0-20.9, adult: Secondary | ICD-10-CM

## 2022-07-17 DIAGNOSIS — R1312 Dysphagia, oropharyngeal phase: Secondary | ICD-10-CM

## 2022-07-17 LAB — LACTIC ACID, PLASMA
Lactic Acid, Venous: 2.1 mmol/L (ref 0.5–1.9)
Lactic Acid, Venous: 1.2 mmol/L (ref 0.5–1.9)

## 2022-07-17 LAB — COMPREHENSIVE METABOLIC PANEL
ALT: 10 U/L (ref 0–44)
AST: 22 U/L (ref 15–41)
Albumin: 3.5 g/dL (ref 3.5–5.0)
Alkaline Phosphatase: 95 U/L (ref 38–126)
Anion gap: 10 (ref 5–15)
BUN: 14 mg/dL (ref 8–23)
CO2: 26 mmol/L (ref 22–32)
Calcium: 9.3 mg/dL (ref 8.9–10.3)
Chloride: 105 mmol/L (ref 98–111)
Creatinine, Ser: 0.86 mg/dL (ref 0.61–1.24)
GFR, Estimated: >60 mL/min (ref >60)
Glucose, Bld: 135 mg/dL — ABNORMAL HIGH (ref 70–99)
Potassium: 4.6 mmol/L (ref 3.5–5.1)
Sodium: 141 mmol/L (ref 135–145)
Total Bilirubin: 0.8 mg/dL (ref 0.3–1.2)
Total Protein: 7.8 g/dL (ref 6.5–8.1)

## 2022-07-17 LAB — CBC WITH DIFFERENTIAL/PLATELET
Abs Immature Granulocytes: 0.09 10*3/uL — ABNORMAL HIGH (ref 0.00–0.07)
Basophils Absolute: 0.1 10*3/uL (ref 0.0–0.1)
Basophils Relative: 1 %
Eosinophils Absolute: 0 10*3/uL (ref 0.0–0.5)
Eosinophils Relative: 0 %
HCT: 37.1 % — ABNORMAL LOW (ref 39.0–52.0)
Hemoglobin: 11.4 g/dL — ABNORMAL LOW (ref 13.0–17.0)
Immature Granulocytes: 1 %
Lymphocytes Relative: 5 %
Lymphs Abs: 0.7 10*3/uL (ref 0.7–4.0)
MCH: 27.9 pg (ref 26.0–34.0)
MCHC: 30.7 g/dL (ref 30.0–36.0)
MCV: 90.7 fL (ref 80.0–100.0)
Monocytes Absolute: 0.4 10*3/uL (ref 0.1–1.0)
Monocytes Relative: 3 %
Neutro Abs: 14.1 10*3/uL — ABNORMAL HIGH (ref 1.7–7.7)
Neutrophils Relative %: 90 %
Platelets: 407 10*3/uL — ABNORMAL HIGH (ref 150–400)
RBC: 4.09 MIL/uL — ABNORMAL LOW (ref 4.22–5.81)
RDW: 15.3 % (ref 11.5–15.5)
WBC: 15.4 10*3/uL — ABNORMAL HIGH (ref 4.0–10.5)
nRBC: 0 % (ref 0.0–0.2)

## 2022-07-17 LAB — BRAIN NATRIURETIC PEPTIDE: B Natriuretic Peptide: 141.5 pg/mL — ABNORMAL HIGH (ref 0.0–100.0)

## 2022-07-17 LAB — RESP PANEL BY RT-PCR (RSV, FLU A&B, COVID)  RVPGX2
Influenza A by PCR: NEGATIVE
Influenza B by PCR: NEGATIVE
Resp Syncytial Virus by PCR: NEGATIVE
SARS Coronavirus 2 by RT PCR: POSITIVE — AB

## 2022-07-17 LAB — BLOOD GAS, VENOUS

## 2022-07-17 LAB — PROTIME-INR
INR: 1.3 — ABNORMAL HIGH (ref 0.8–1.2)
Prothrombin Time: 16.3 seconds — ABNORMAL HIGH (ref 11.4–15.2)

## 2022-07-17 LAB — PROCALCITONIN: Procalcitonin: 0.13 ng/mL

## 2022-07-17 MED ORDER — SODIUM CHLORIDE 0.9 % IV BOLUS: 500.0000 mL | Freq: Once | INTRAVENOUS | Status: DC

## 2022-07-17 MED ORDER — ENOXAPARIN SODIUM 40 MG/0.4ML IJ SOSY
40.0000 mg | PREFILLED_SYRINGE | INTRAMUSCULAR | Status: DC
  Administered 2022-07-17 – 2022-07-21 (×5): 40 mg via SUBCUTANEOUS
  Filled 2022-07-17 (×5): qty 0.4

## 2022-07-17 MED ORDER — NIRMATRELVIR/RITONAVIR (PAXLOVID)TABLET
3.0000 | ORAL_TABLET | Freq: Two times a day (BID) | ORAL | Status: AC
  Administered 2022-07-17 – 2022-07-21 (×8): 3 via ORAL
  Filled 2022-07-17: qty 30

## 2022-07-17 MED ORDER — ASPIRIN 81 MG PO TBEC
81.0000 mg | DELAYED_RELEASE_TABLET | Freq: Every day | ORAL | Status: DC
  Administered 2022-07-17 – 2022-07-21 (×5): 81 mg via ORAL
  Filled 2022-07-17 (×5): qty 1

## 2022-07-17 MED ORDER — SODIUM CHLORIDE 0.9 % IV BOLUS
1000.0000 mL | Freq: Once | INTRAVENOUS | Status: AC
  Administered 2022-07-17: 1000 mL via INTRAVENOUS

## 2022-07-17 MED ORDER — ALBUTEROL SULFATE (2.5 MG/3ML) 0.083% IN NEBU
2.5000 mg | INHALATION_SOLUTION | RESPIRATORY_TRACT | Status: DC | PRN
  Administered 2022-07-17: 2.5 mg via RESPIRATORY_TRACT
  Filled 2022-07-17: qty 3

## 2022-07-17 MED ORDER — VITAMIN D 25 MCG (1000 UNIT) PO TABS
1000.0000 [IU] | ORAL_TABLET | Freq: Every day | ORAL | Status: DC
  Administered 2022-07-18 – 2022-07-21 (×4): 1000 [IU] via ORAL
  Filled 2022-07-17 (×6): qty 1

## 2022-07-17 MED ORDER — PIMECROLIMUS 1 % EX CREA: TOPICAL_CREAM | Freq: Two times a day (BID) | CUTANEOUS | Status: DC

## 2022-07-17 MED ORDER — ONDANSETRON HCL 4 MG/2ML IJ SOLN
4.0000 mg | Freq: Once | INTRAMUSCULAR | Status: AC
  Administered 2022-07-17: 4 mg via INTRAVENOUS
  Filled 2022-07-17: qty 2

## 2022-07-17 MED ORDER — DM-GUAIFENESIN ER 30-600 MG PO TB12: 1.0000 | ORAL_TABLET | Freq: Two times a day (BID) | ORAL | Status: DC | PRN

## 2022-07-17 MED ORDER — SODIUM CHLORIDE 0.9 % IV SOLN
3.0000 g | Freq: Four times a day (QID) | INTRAVENOUS | Status: AC
  Administered 2022-07-17 – 2022-07-22 (×20): 3 g via INTRAVENOUS
  Filled 2022-07-17 (×6): qty 8
  Filled 2022-07-17: qty 3
  Filled 2022-07-17 (×6): qty 8
  Filled 2022-07-17: qty 3
  Filled 2022-07-17 (×2): qty 8
  Filled 2022-07-17: qty 3
  Filled 2022-07-17: qty 8
  Filled 2022-07-17: qty 3
  Filled 2022-07-17: qty 8

## 2022-07-17 MED ORDER — METOPROLOL TARTRATE 5 MG/5ML IV SOLN: 5.0000 mg | INTRAVENOUS | Status: DC | PRN

## 2022-07-17 MED ORDER — HYDRALAZINE HCL 20 MG/ML IJ SOLN: 5.0000 mg | INTRAMUSCULAR | Status: DC | PRN

## 2022-07-17 MED ORDER — QUETIAPINE FUMARATE 25 MG PO TABS
50.0000 mg | ORAL_TABLET | Freq: Every day | ORAL | Status: DC
  Administered 2022-07-17: 50 mg via ORAL
  Filled 2022-07-17: qty 2

## 2022-07-17 MED ORDER — ACETAMINOPHEN 325 MG PO TABS: 650.0000 mg | ORAL_TABLET | Freq: Four times a day (QID) | ORAL | Status: DC | PRN

## 2022-07-17 MED ORDER — SODIUM CHLORIDE 0.9 % IV SOLN: INTRAVENOUS | Status: DC

## 2022-07-17 MED ORDER — ONDANSETRON HCL 4 MG/2ML IJ SOLN: 4.0000 mg | Freq: Three times a day (TID) | INTRAMUSCULAR | Status: DC | PRN

## 2022-07-17 MED ORDER — NEPRO/CARBSTEADY PO LIQD
237.0000 mL | Freq: Two times a day (BID) | ORAL | Status: DC
  Administered 2022-07-19: 237 mL via ORAL

## 2022-07-17 MED ORDER — ACETAMINOPHEN 650 MG RE SUPP: 650.0000 mg | Freq: Four times a day (QID) | RECTAL | Status: DC | PRN

## 2022-07-17 MED ORDER — SODIUM CHLORIDE 0.9 % IV SOLN
3.0000 g | Freq: Once | INTRAVENOUS | Status: AC
  Administered 2022-07-17: 3 g via INTRAVENOUS
  Filled 2022-07-17: qty 8

## 2022-07-17 MED ORDER — LACTATED RINGERS IV BOLUS
1000.0000 mL | Freq: Once | INTRAVENOUS | Status: AC
  Administered 2022-07-17: 1000 mL via INTRAVENOUS

## 2022-07-17 NOTE — Sepsis Progress Note (Signed)
Elink following code sepsis °

## 2022-07-17 NOTE — Consult Note (Signed)
CODE SEPSIS - PHARMACY COMMUNICATION  **Broad Spectrum Antibiotics should be administered within 1 hour of Sepsis diagnosis**  Time Code Sepsis Called/Page Received: 1028  Antibiotics Ordered: Unasyn  Time of 1st antibiotic administration: 1143  Additional action taken by pharmacy: Messaged nurse, waiting on IV access.  Lorin Picket ,PharmD Clinical Pharmacist  07/17/2022  10:28 AM

## 2022-07-17 NOTE — Sepsis Progress Note (Signed)
Multiple attempts to obtain 2nd IV and collect blood. Second BC obtained after abx started. Second lactic acid drawn approx 1500, not 1153 as stated on record. Drawn late due to giving abx and IVF. Thanks to the ED RN for her help.

## 2022-07-17 NOTE — ED Notes (Signed)
Unable to complete 2nd set of BC due to unable to collect an efficient amount of blood.

## 2022-07-17 NOTE — ED Provider Notes (Signed)
[facility] Provider Note    Event Date/Time   First MD Initiated Contact with Patient 07/17/22 1011     (approximate)   History   Chief Complaint Shortness of Breath   HPI  Todd Trujillo. is a 89 y.o. male with past medical history of hypertension, stroke, vascular dementia, and recurrent aspiration who presents to the ED complaining of shortness of breath.  Patient's wife at bedside states that he initially vomited 5 days ago, has appeared to gradually worsen since then.  He has had a cough where it sounds like something has been stuck in his throat, had another small episode of vomiting earlier today.  Wife does state that he was exposed to his daughter with COVID-19 and subsequently tested positive for COVID yesterday.  She has noticed that his breathing has steadily worsened over the past 24 hours and so decided to seek care in the ED.  Patient unable to provide any history.     Physical Exam   Triage Vital Signs: ED Triage Vitals  Enc Vitals Group     BP 07/17/22 0948 (!) 162/76     Pulse Rate 07/17/22 0948 (!) 117     Resp 07/17/22 0948 (!) 34     Temp --      Temp src --      SpO2 07/17/22 0948 96 %     Weight 07/17/22 0949 140 lb (63.5 kg)     Height 07/17/22 0949 '5\' 10"'$  (1.778 m)     Head Circumference --      Peak Flow --      Pain Score --      Pain Loc --      Pain Edu? --      Excl. in [place]? --     Most recent vital signs: Vitals:   07/17/22 0948 07/17/22 1038  BP: (!) 162/76   Pulse: (!) 117   Resp: (!) 34   SpO2: 96% 98%    Constitutional: Alert and oriented. Eyes: Conjunctivae are normal. Head: Atraumatic. Nose: No congestion/rhinnorhea. Mouth/Throat: Mucous membranes are moist.  Cardiovascular: Tachycardic, regular rhythm. Grossly normal heart sounds.  2+ radial pulses bilaterally. Respiratory: Tachypneic with increased respiratory effort.  No retractions.  Right lung with significant coarse crackles, no  wheezing noted. Gastrointestinal: Soft and nontender. No distention. Musculoskeletal: No lower extremity tenderness nor edema.  Neurologic:  Normal speech and language. No gross focal neurologic deficits are appreciated.    ED Results / Procedures / Treatments   Labs (all labs ordered are listed, but only abnormal results are displayed) Labs Reviewed  RESP PANEL BY RT-PCR (RSV, FLU A&B, COVID)  [id number] - Abnormal; Notable for the following components:      Result Value   SARS Coronavirus 2 by RT PCR POSITIVE (*)    All other components within normal limits  COMPREHENSIVE METABOLIC PANEL - Abnormal; Notable for the following components:   Glucose, Bld 135 (*)    All other components within normal limits  LACTIC ACID, PLASMA - Abnormal; Notable for the following components:   Lactic Acid, Venous 2.1 (*)    All other components within normal limits  PROTIME-INR - Abnormal; Notable for the following components:   Prothrombin Time 16.3 (*)    INR 1.3 (*)    All other components within normal limits  BLOOD GAS, VENOUS - Abnormal; Notable for the following components:   pH, Ven 7.45 (*)    pCO2, Ven 40 (*)  Acid-Base Excess 3.5 (*)    All other components within normal limits  CBC WITH DIFFERENTIAL/PLATELET - Abnormal; Notable for the following components:   WBC 15.4 (*)    RBC 4.09 (*)    Hemoglobin 11.4 (*)    HCT 37.1 (*)    Platelets 407 (*)    Neutro Abs 14.1 (*)    Abs Immature Granulocytes 0.09 (*)    All other components within normal limits  CULTURE, BLOOD (ROUTINE X 2)  CULTURE, BLOOD (ROUTINE X 2)  LACTIC ACID, PLASMA  CBC WITH DIFFERENTIAL/PLATELET  URINALYSIS, ROUTINE W REFLEX MICROSCOPIC  CBC WITH DIFFERENTIAL/PLATELET  PROCALCITONIN     EKG  ED ECG REPORT I, Blake Divine, the attending physician, personally viewed and interpreted this ECG.   Date: 07/17/2022  EKG Time: 9:53  Rate: 116  Rhythm: sinus tachycardia  Axis: Normal  Intervals:none   ST&T Change: None  RADIOLOGY Chest x-ray reviewed and interpreted by me with bilateral basilar infiltrates.  PROCEDURES:  Critical Care performed: Yes, see critical care procedure note(s)  .Critical Care  Performed by: Blake Divine, MD Authorized by: Blake Divine, MD   Critical care provider statement:    Critical care time (minutes):  30   Critical care time was exclusive of:  Separately billable procedures and treating other patients and teaching time   Critical care was necessary to treat or prevent imminent or life-threatening deterioration of the following conditions:  Sepsis and respiratory failure   Critical care was time spent personally by me on the following activities:  Development of treatment plan with patient or surrogate, discussions with consultants, evaluation of patient's response to treatment, examination of patient, ordering and review of laboratory studies, ordering and review of radiographic studies, ordering and performing treatments and interventions, pulse oximetry, re-evaluation of patient's condition and review of old charts   I assumed direction of critical care for this patient from another provider in my specialty: no     Care discussed with: admitting provider      MEDICATIONS ORDERED IN ED: Medications  lactated ringers bolus 1,000 mL (has no administration in time range)  Ampicillin-Sulbactam (UNASYN) 3 g in sodium chloride 0.9 % 100 mL IVPB (0 g Intravenous Stopped 07/17/22 1215)  ondansetron (ZOFRAN) injection 4 mg (4 mg Intravenous Given 07/17/22 1142)     IMPRESSION / MDM / [place] / ED COURSE  I reviewed the triage vital signs and the nursing notes.                              89 y.o. male with past medical history of hypertension, stroke, vascular dementia, and recurrent aspiration who presents to the ED for multiple episodes of vomiting over the past 5 days, now with increasing difficulty breathing.  Patient's presentation  is most consistent with acute presentation with potential threat to life or bodily function.  Differential diagnosis includes, but is not limited to, sepsis, respiratory failure, aspiration pneumonia, other pneumonia, pleural effusion, CHF, COPD, electrolyte abnormality, AKI, COVID-19.  Patient ill-appearing and in some respiratory distress, tachypneic with accessory muscle use.  He has significant rales on the right lung, appears to be dry heaving with ongoing concerns for aspiration.  Oxygen saturations were 91 to 92% on 2 L nasal cannula, will transition patient to high flow nasal cannula to assist with his work of breathing.  EKG shows sinus tachycardia with no ischemic changes, with tachypnea patient meets criteria for  sepsis.  We will hydrate with IV fluids and start on IV Unasyn, labs and chest x-ray are pending.  Chest x-ray shows interstitial infiltrates at the bilateral bases, concerning for aspiration pneumonia.  Patient has a leukocytosis consistent with sepsis, no significant electrolyte abnormality or AKI noted and VBG is reassuring with no hypercapnia.  Lactic acid mildly elevated and we will trend following IV fluids.  Patient appears more comfortable on high flow nasal cannula, case discussed with hospitalist for admission.      FINAL CLINICAL IMPRESSION(S) / ED DIAGNOSES   Final diagnoses:  Sepsis with acute respiratory failure without septic shock, due to unspecified organism, unspecified whether hypoxia or hypercapnia present ([place])  Aspiration pneumonia of both lower lobes, unspecified aspiration pneumonia type ([place])  COVID-19     Rx / DC Orders   ED Discharge Orders     None        Note:  This document was prepared using Dragon voice recognition software and may include unintentional dictation errors.   Blake Divine, MD 07/17/22 1245

## 2022-07-17 NOTE — H&P (Signed)
History and Physical    Todd Trujillo. IRW:[id number] DOB: 04-03-1930 DOA: 07/17/2022  Referring MD/NP/PA:   PCP: Einar Pheasant, MD   Patient coming from:  The patient is coming from home.    Chief Complaint: SOB  HPI: Todd Trujillo. is a 89 y.o. male with medical history significant of dementia, hypertension, hyperlipidemia, diastolic CHF, stroke, GERD, dementia, skin cancer, prostate cancer, kidney stone, aspiration pneumonia, colitis, who presents with shortness breath.  Per daughter at the bedside, patient had 2 episode of vomiting, first episode of vomiting was 5 days ago, then vomited again last night.  Patient does not have abdominal pain, but has loose stool.  He developed cough, shortness of breath, subjective fever, chills in the past several days.  He was tested positive for COVID yesterday. Pt does not have productive cough, but he seems to have mucus stuck in his throat.  He has gurgling sound.  No chest pain.  No symptoms of UTI.  Mental status is at baseline per her daughter.  Patient is normally not using oxygen, but was found to have severe respiratory distress, using accessory muscle for breathing.  High flow nasal cannula oxygen was started in ED.  Data reviewed independently and ED Course: pt was found to have positive COVID PCR, WBC 15.4, INR 1.3, GFR> 60, blood pressure 162/76, heart rate 110-120s, RR 30s.  Chest x-ray showed bilateral basilar opacity.  Patient is admitted to PCU as inpatient.  EKG: I have personally reviewed.  Sinus rhythm, QTc 411, LAD, poor R wave progression, anteroseptal infarction pattern   Review of Systems:   General: no fevers, chills, no body weight gain, has fatigue HEENT: no blurry vision, hearing changes or sore throat Respiratory: has dyspnea, coughing, no wheezing CV: no chest pain, no palpitations GI: has nausea, vomiting, no abdominal pain, constipation. Has loose stool. GU: no dysuria, burning on  urination, increased urinary frequency, hematuria  Ext: no leg edema Neuro: no unilateral weakness, numbness, or tingling, no vision change or hearing loss Skin: no rash, no skin tear. MSK: No muscle spasm, no deformity, no limitation of range of movement in spin Heme: No easy bruising.  Travel history: No recent long distant travel.   Allergy:  Allergies  Allergen Reactions   Codeine Nausea And Vomiting and Nausea Only    Other reaction(s): Vomiting    Past Medical History:  Diagnosis Date   Arthritis    Cancer ([place])    prostate   Hyperlipidemia    Hypertension    Left wrist fracture    Macular degeneration of right eye    Nephrolithiasis    Prostate CA ([place]) 2003   Prostate troubles    Patient was unsure of term   Squamous cell carcinoma of skin 01/23/2017   R distal lat bicep near elbow   Squamous cell carcinoma of skin 03/05/2022   L forearm posterior - tx with ED&C    Past Surgical History:  Procedure Laterality Date   INTRAMEDULLARY (IM) NAIL INTERTROCHANTERIC Right 06/07/2021   Procedure: INTRAMEDULLARY (IM) NAIL INTERTROCHANTRIC;  Surgeon: Thornton Park, MD;  Location: [facility] ORS;  Service: Orthopedics;  Laterality: Right;   TOTAL KNEE ARTHROPLASTY Right     Social History:  reports that he has never smoked. He has never been exposed to tobacco smoke. He has never used smokeless tobacco. He reports that he does not currently use drugs. He reports that he does not drink alcohol.  Family History:  Family History  Problem  Relation Age of Onset   Colon cancer Mother    Esophageal cancer Brother    Breast cancer Daughter      Prior to Admission medications   Medication Sig Start Date End Date Taking? Authorizing Provider  acetaminophen (TYLENOL) 500 MG tablet Take 500 mg by mouth every 6 (six) hours as needed. Patient not taking: Reported on 06/21/2022    [provider]  aspirin EC 81 MG tablet Take 1 tablet (81 mg total) by mouth daily. Swallow  whole. 08/19/21   Einar Pheasant, MD  cholecalciferol (VITAMIN D3) 25 MCG (1000 UNIT) tablet Take 1,000 Units by mouth daily.    [provider]  ciprofloxacin (CILOXAN) 0.3 % ophthalmic solution PLACE 1 DROP INTO BOTH EYES EVERY 6 HOURS 04/20/22   Einar Pheasant, MD  Nutritional Supplements (FEEDING SUPPLEMENT, NEPRO CARB STEADY,) LIQD Take 237 mLs by mouth 2 (two) times daily between meals. 06/15/21   Sharen Hones, MD  pimecrolimus (ELIDEL) 1 % cream Apply topically 2 (two) times daily. 06/21/22   Einar Pheasant, MD  QUEtiapine (SEROQUEL) 50 MG tablet TAKE 1 TABLET BY MOUTH AT BEDTIME. 07/17/22   Einar Pheasant, MD    Physical Exam: Vitals:   07/17/22 1400 07/17/22 1500 07/17/22 1600 07/17/22 1700  BP: 125/68 118/69 138/73 115/68  Pulse: (!) 122 (!) 107 (!) 108 (!) 104  Resp: 20 (!) 22 (!) 22 (!) 22  SpO2: 97% 98% 100% 99%  Weight:      Height:       General: Has acute respiratory distress  HEENT:       Eyes: PERRL, EOMI, no scleral icterus.       ENT: No discharge from the ears and nose, no pharynx injection, no tonsillar enlargement.        Neck: No JVD, no bruit, no mass felt. Heme: No neck lymph node enlargement. Cardiac: S1/S2, RRR, No murmurs, No gallops or rubs. Respiratory: Has coarse breath sounds diffusely, has gurgling sound GI: Soft, nondistended, nontender, no rebound pain, no organomegaly, BS present. GU: No hematuria Ext: No pitting leg edema bilaterally. 1+DP/PT pulse bilaterally. Musculoskeletal: No joint deformities, No joint redness or warmth, no limitation of ROM in spin. Skin: No rashes.  Neuro: Alert,  cranial nerves II-XII grossly intact, moves all extremities. Psych: Patient is not psychotic, no suicidal or hemocidal ideation.  Labs on Admission: I have personally reviewed following labs and imaging studies  CBC: Recent Labs  Lab 07/17/22 1146  WBC 15.4*  NEUTROABS 14.1*  HGB 11.4*  HCT 37.1*  MCV 90.7  PLT 734*   Basic Metabolic  Panel: Recent Labs  Lab 07/17/22 1012  NA 141  K 4.6  CL 105  CO2 26  GLUCOSE 135*  BUN 14  CREATININE 0.86  CALCIUM 9.3   GFR: Estimated Creatinine Clearance: 51.3 mL/min (by C-G formula based on SCr of 0.86 mg/dL). Liver Function Tests: Recent Labs  Lab 07/17/22 1012  AST 22  ALT 10  ALKPHOS 95  BILITOT 0.8  PROT 7.8  ALBUMIN 3.5   No results for input(s): "LIPASE", "AMYLASE" in the last 168 hours. No results for input(s): "AMMONIA" in the last 168 hours. Coagulation Profile: Recent Labs  Lab 07/17/22 1012  INR 1.3*   Cardiac Enzymes: No results for input(s): "CKTOTAL", "CKMB", "CKMBINDEX", "TROPONINI" in the last 168 hours. BNP (last 3 results) No results for input(s): "PROBNP" in the last 8760 hours. HbA1C: No results for input(s): "HGBA1C" in the last 72 hours. CBG: No results  for input(s): "GLUCAP" in the last 168 hours. Lipid Profile: No results for input(s): "CHOL", "HDL", "LDLCALC", "TRIG", "CHOLHDL", "LDLDIRECT" in the last 72 hours. Thyroid Function Tests: No results for input(s): "TSH", "T4TOTAL", "FREET4", "T3FREE", "THYROIDAB" in the last 72 hours. Anemia Panel: No results for input(s): "VITAMINB12", "FOLATE", "FERRITIN", "TIBC", "IRON", "RETICCTPCT" in the last 72 hours. Urine analysis:    Component Value Date/Time   COLORURINE YELLOW (A) 06/08/2022 2300   APPEARANCEUR HAZY (A) 06/08/2022 2300   LABSPEC >1.046 (H) 06/08/2022 2300   PHURINE 6.0 06/08/2022 2300   GLUCOSEU NEGATIVE 06/08/2022 2300   HGBUR NEGATIVE 06/08/2022 2300   BILIRUBINUR NEGATIVE 06/08/2022 2300   KETONESUR 5 (A) 06/08/2022 2300   PROTEINUR 30 (A) 06/08/2022 2300   NITRITE POSITIVE (A) 06/08/2022 2300   LEUKOCYTESUR TRACE (A) 06/08/2022 2300   Sepsis Labs: '@LABRCNTIP'$ (procalcitonin:4,lacticidven:4) ) Recent Results (from the past 240 hour(s))  Resp panel by RT-PCR (RSV, Flu A&B, Covid) Anterior Nasal Swab     Status: Abnormal   Collection Time: 07/17/22 10:12 AM    Specimen: Anterior Nasal Swab  Result Value Ref Range Status   SARS Coronavirus 2 by RT PCR POSITIVE (A) NEGATIVE Final    Comment: (NOTE) SARS-CoV-2 target nucleic acids are DETECTED.  The SARS-CoV-2 RNA is generally detectable in upper respiratory specimens during the acute phase of infection. Positive results are indicative of the presence of the identified virus, but do not rule out bacterial infection or co-infection with other pathogens not detected by the test. Clinical correlation with patient history and other diagnostic information is necessary to determine patient infection status. The expected result is Negative.  Fact Sheet for Patients: EntrepreneurPulse.com.au  Fact Sheet for Healthcare Providers: IncredibleEmployment.be  This test is not yet approved or cleared by the [place] FDA and  has been authorized for detection and/or diagnosis of SARS-CoV-2 by FDA under an Emergency Use Authorization (EUA).  This EUA will remain in effect (meaning this test can be used) for the duration of  the COVID-19 declaration under Section 564(b)(1) of the A ct, 21 U.S.C. section 360bbb-3(b)(1), unless the authorization is terminated or revoked sooner.     Influenza A by PCR NEGATIVE NEGATIVE Final   Influenza B by PCR NEGATIVE NEGATIVE Final    Comment: (NOTE) The Xpert Xpress SARS-CoV-2/FLU/RSV plus assay is intended as an aid in the diagnosis of influenza from Nasopharyngeal swab specimens and should not be used as a sole basis for treatment. Nasal washings and aspirates are unacceptable for Xpert Xpress SARS-CoV-2/FLU/RSV testing.  Fact Sheet for Patients: EntrepreneurPulse.com.au  Fact Sheet for Healthcare Providers: IncredibleEmployment.be  This test is not yet approved or cleared by the [place] FDA and has been authorized for detection and/or diagnosis of SARS-CoV-2 by FDA under an  Emergency Use Authorization (EUA). This EUA will remain in effect (meaning this test can be used) for the duration of the COVID-19 declaration under Section 564(b)(1) of the Act, 21 U.S.C. section 360bbb-3(b)(1), unless the authorization is terminated or revoked.     Resp Syncytial Virus by PCR NEGATIVE NEGATIVE Final    Comment: (NOTE) Fact Sheet for Patients: EntrepreneurPulse.com.au  Fact Sheet for Healthcare Providers: IncredibleEmployment.be  This test is not yet approved or cleared by the [place] FDA and has been authorized for detection and/or diagnosis of SARS-CoV-2 by FDA under an Emergency Use Authorization (EUA). This EUA will remain in effect (meaning this test can be used) for the duration of the COVID-19 declaration under Section 564(b)(1) of the  Act, 21 U.S.C. section 360bbb-3(b)(1), unless the authorization is terminated or revoked.  Performed at [facility], [address], [place], [place] [address]      Radiological Exams on Admission: DG Chest Portable 1 View  Result Date: 07/17/2022 CLINICAL DATA:  Shortness of breath EXAM: PORTABLE CHEST 1 VIEW COMPARISON:  06/08/22 CXR FINDINGS: No pleural effusion. No pneumothorax. Normal cardiac and mediastinal contours. There is likely apical predominant emphysema. Compared to prior exam there is slight interval increase in prominence of interstitial opacities at the bilateral lung bases. No displaced rib fractures. Visualized upper abdomen is unremarkable. IMPRESSION: Slight interval increase in prominence of interstitial opacities at the bilateral lung bases, which could be seen in the setting of small airways disease/bronchitis. Electronically Signed   By: Marin Roberts M.D.   On: 07/17/2022 10:36      Assessment/Plan Principal Problem:   Acute respiratory disease due to COVID-19 virus Active Problems:   Acute respiratory failure with hypoxia (HCC)   Aspiration  pneumonia (HCC)   Severe sepsis (HCC)   Dementia without behavioral disturbance (HCC)   HTN (hypertension)   History of CVA (cerebrovascular accident)   Nausea & vomiting   Assessment and Plan:   Acute respiratory failure with hypoxia and severe sepsis due to acute respiratory disease due to COVID-19 virus and aspiration pneumonia [facility]): Patient has severe sepsis with WBC 15.  4, heart rate 110-120, RR 34, lactic acid 2.1.  Temperature is not checked yet.  -Admited to progressive unit as inpatient - High flow nasal cannula oxygen (patient has risk of aspiration, will not use an BiPAP) - Paxlovid - Unasyn - Mucinex for cough  - Bronchodilators - Follow up blood culture x2, sputum culture - will get Procalcitonin and trend lactic acid level per sepsis protocol - IVF: 1.5L of NS bolus in ED, followed by 75 mL per hour of NS   Dementia without behavioral disturbance (HCC) -Continue Seroquel  HTN (hypertension): Patient not taking medications.  Blood pressure 162/76 -IV hydralazine as needed  History of CVA (cerebrovascular accident) -Aspirin  Nausea & vomiting: Patient only had 2 episode of nausea vomiting.  Currently no nausea vomiting, diarrhea or abdominal pain. -As needed Zofran      DVT ppx: SQ Lovenox  Code Status: DNR per his daughter (if blood pressure drops, vasopressor can be used to per her daughter).   Family Communication: Yes, patient's daughter   at bed side.   Disposition Plan:  Anticipate discharge back to previous environment  Consults called:  none  Admission status and Level of care: Progressive:   as inpt      Dispo: The patient is from: Home              Anticipated d/c is to: Home              Anticipated d/c date is: 2 days              Patient currently is not medically stable to d/c.    Severity of Illness:  The appropriate patient status for this patient is INPATIENT. Inpatient status is judged to be reasonable and necessary in  order to provide the required intensity of service to ensure the patient's safety. The patient's presenting symptoms, physical exam findings, and initial radiographic and laboratory data in the context of their chronic comorbidities is felt to place them at high risk for further clinical deterioration. Furthermore, it is not anticipated that the patient will be medically stable  for discharge from the hospital within 2 midnights of admission.   * I certify that at the point of admission it is my clinical judgment that the patient will require inpatient hospital care spanning beyond 2 midnights from the point of admission due to high intensity of service, high risk for further deterioration and high frequency of surveillance required.*       Date of Service 07/17/2022    Ivor Costa [facility]   If 7PM-7AM, please contact night-coverage [email or web address] 07/17/2022, 7:09 PM

## 2022-07-17 NOTE — ED Notes (Signed)
Lab called with critical, Lactic Acid 2.5. Provider notified.

## 2022-07-17 NOTE — Consult Note (Signed)
Pharmacy Antibiotic Note  Todd Trujillo. is a 89 y.o. male admitted on 07/17/2022 with complaint of shortness of breath.  Patient is COVID-19 positive.  Pharmacy has been consulted for Unasyn dosing for aspiration pneumonia.  Plan: Unasyn 3 grams IV every 6 hours Follow culture results and renal function for need of adjustments  Height: '5\' 10"'$  (177.8 cm) Weight: 63.5 kg (140 lb) IBW/kg (Calculated) : 73  No data recorded.  Recent Labs  Lab 07/17/22 1012 07/17/22 1146  WBC  --  15.4*  CREATININE 0.86  --   LATICACIDVEN  --  2.1*    Estimated Creatinine Clearance: 51.3 mL/min (by C-G formula based on SCr of 0.86 mg/dL).    Allergies  Allergen Reactions   Codeine Nausea And Vomiting and Nausea Only    Other reaction(s): Vomiting    Antimicrobials this admission: Unasyn 1/2 >>    Dose adjustments this admission: N/A  Microbiology results: 1/2 BCx: pending 1/2 Sputum: pending   Thank you for allowing pharmacy to be a part of this patient's care.  Lorin Picket, PharmD 07/17/2022 1:06 PM

## 2022-07-17 NOTE — ED Triage Notes (Signed)
Pt via EMS from home. Pt c/o SOB since Thanksgiving. Pt tested positive for COVID yesterday. States that he has a hx of dementia but states he has been more altered than normal. Pt is alert but unable to answer questions. Coarse crackles audible RA sat 96%. Pt placed on 2L [place] for comfort. RR is labored.   Unable to get axillary or oral temp.

## 2022-07-18 DIAGNOSIS — A419 Sepsis, unspecified organism: Secondary | ICD-10-CM | POA: Diagnosis not present

## 2022-07-18 DIAGNOSIS — I69318 Other symptoms and signs involving cognitive functions following cerebral infarction: Secondary | ICD-10-CM | POA: Diagnosis not present

## 2022-07-18 DIAGNOSIS — Z8546 Personal history of malignant neoplasm of prostate: Secondary | ICD-10-CM

## 2022-07-18 DIAGNOSIS — J690 Pneumonitis due to inhalation of food and vomit: Secondary | ICD-10-CM | POA: Diagnosis not present

## 2022-07-18 DIAGNOSIS — Z8673 Personal history of transient ischemic attack (TIA), and cerebral infarction without residual deficits: Secondary | ICD-10-CM | POA: Diagnosis not present

## 2022-07-18 DIAGNOSIS — I10 Essential (primary) hypertension: Secondary | ICD-10-CM | POA: Diagnosis not present

## 2022-07-18 DIAGNOSIS — N400 Enlarged prostate without lower urinary tract symptoms: Secondary | ICD-10-CM

## 2022-07-18 DIAGNOSIS — K639 Disease of intestine, unspecified: Secondary | ICD-10-CM

## 2022-07-18 DIAGNOSIS — D509 Iron deficiency anemia, unspecified: Secondary | ICD-10-CM

## 2022-07-18 DIAGNOSIS — E785 Hyperlipidemia, unspecified: Secondary | ICD-10-CM

## 2022-07-18 DIAGNOSIS — U071 COVID-19: Secondary | ICD-10-CM | POA: Diagnosis not present

## 2022-07-18 DIAGNOSIS — Z7982 Long term (current) use of aspirin: Secondary | ICD-10-CM

## 2022-07-18 DIAGNOSIS — Z85528 Personal history of other malignant neoplasm of kidney: Secondary | ICD-10-CM

## 2022-07-18 DIAGNOSIS — Z9181 History of falling: Secondary | ICD-10-CM

## 2022-07-18 DIAGNOSIS — Z993 Dependence on wheelchair: Secondary | ICD-10-CM

## 2022-07-18 DIAGNOSIS — M1990 Unspecified osteoarthritis, unspecified site: Secondary | ICD-10-CM

## 2022-07-18 DIAGNOSIS — J9601 Acute respiratory failure with hypoxia: Secondary | ICD-10-CM | POA: Diagnosis not present

## 2022-07-18 DIAGNOSIS — H3530 Unspecified macular degeneration: Secondary | ICD-10-CM

## 2022-07-18 DIAGNOSIS — F0280 Dementia in other diseases classified elsewhere without behavioral disturbance: Secondary | ICD-10-CM | POA: Diagnosis not present

## 2022-07-18 LAB — CBC
HCT: 28.7 % — ABNORMAL LOW (ref 39.0–52.0)
Hemoglobin: 8.6 g/dL — ABNORMAL LOW (ref 13.0–17.0)
MCH: 27.7 pg (ref 26.0–34.0)
MCHC: 30 g/dL (ref 30.0–36.0)
MCV: 92.6 fL (ref 80.0–100.0)
Platelets: 290 10*3/uL (ref 150–400)
RBC: 3.1 MIL/uL — ABNORMAL LOW (ref 4.22–5.81)
RDW: 15.5 % (ref 11.5–15.5)
WBC: 13.4 10*3/uL — ABNORMAL HIGH (ref 4.0–10.5)
nRBC: 0 % (ref 0.0–0.2)

## 2022-07-18 LAB — URINALYSIS, ROUTINE W REFLEX MICROSCOPIC
Bilirubin Urine: NEGATIVE
Glucose, UA: NEGATIVE mg/dL
Hgb urine dipstick: NEGATIVE
Ketones, ur: 5 mg/dL — AB
Leukocytes,Ua: NEGATIVE
Nitrite: NEGATIVE
Protein, ur: 30 mg/dL — AB
Specific Gravity, Urine: 1.028 (ref 1.005–1.030)
pH: 5 (ref 5.0–8.0)

## 2022-07-18 LAB — BASIC METABOLIC PANEL
Anion gap: 8 (ref 5–15)
BUN: 16 mg/dL (ref 8–23)
CO2: 24 mmol/L (ref 22–32)
Calcium: 8.3 mg/dL — ABNORMAL LOW (ref 8.9–10.3)
Chloride: 110 mmol/L (ref 98–111)
Creatinine, Ser: 0.74 mg/dL (ref 0.61–1.24)
GFR, Estimated: >60 mL/min (ref >60)
Glucose, Bld: 114 mg/dL — ABNORMAL HIGH (ref 70–99)
Potassium: 3.9 mmol/L (ref 3.5–5.1)
Sodium: 142 mmol/L (ref 135–145)

## 2022-07-18 LAB — C-REACTIVE PROTEIN: CRP: 6.8 mg/dL — ABNORMAL HIGH (ref <1.0)

## 2022-07-18 MED ORDER — DEXTROSE-NACL 5-0.45 % IV SOLN: INTRAVENOUS | Status: DC

## 2022-07-18 MED ORDER — DEXTROSE-NACL 5-0.45 % IV SOLN: INTRAVENOUS | Status: AC

## 2022-07-18 MED ORDER — DEXAMETHASONE SODIUM PHOSPHATE 10 MG/ML IJ SOLN
6.0000 mg | Freq: Every day | INTRAMUSCULAR | Status: DC
  Administered 2022-07-18 – 2022-07-21 (×4): 6 mg via INTRAVENOUS
  Filled 2022-07-18 (×4): qty 1

## 2022-07-18 MED ORDER — QUETIAPINE FUMARATE 25 MG PO TABS
25.0000 mg | ORAL_TABLET | Freq: Every day | ORAL | Status: DC
  Administered 2022-07-19 – 2022-07-20 (×2): 25 mg via ORAL
  Filled 2022-07-18 (×6): qty 1

## 2022-07-18 NOTE — Progress Notes (Addendum)
Pt was asleep when staff came in the room. Staff mentioned that pt is on Paxlovid for covid but family at bedside states" He will just take the morning dose tomorrow". Family at bedside was educated about the medicine importance. NP Randol Kern made Aware.Will continue to monitor.

## 2022-07-18 NOTE — Progress Notes (Signed)
Progress Note    Todd Trujillo.  KGU:[id number] DOB: 1930-04-06  DOA: 07/17/2022 PCP: Einar Pheasant, MD      Brief Narrative:    Medical records reviewed and are as summarized below:  Todd Trujillo. is a 89 y.o. male male with medical history significant for recent discharge from the hospital on 06/11/2022 for aspiration pneumonia, hypoxia and acute colitis; dementia, hypertension, hyperlipidemia, chronic diastolic CHF, stroke, GERD, dementia, skin cancer, prostate cancer, kidney stone, aspiration pneumonia, colitis, who presented to the hospital with cough, shortness of breath and vomiting.  Her daughter said that patient had been ill and vomiting since Thanksgiving and they attributed this to aspiration pneumonia.  He tested positive for COVID-19 infection a day prior to admission.       Assessment/Plan:   Principal Problem:   Acute respiratory disease due to COVID-19 virus Active Problems:   Acute respiratory failure with hypoxia (HCC)   Aspiration pneumonia (HCC)   Severe sepsis (HCC)   Dementia without behavioral disturbance (HCC)   HTN (hypertension)   History of CVA (cerebrovascular accident)   Nausea & vomiting    Body mass index is 20.09 kg/m.   Severe sepsis secondary to COVID-19 pneumonia, recent aspiration pneumonia: Continue Paxlovid as able.  Continue IV Unasyn.  Acute hypoxic respiratory failure: He is on oxygen via heated humidified high flow nasal cannula.  Taper down oxygen as able.  Hypotension, poor oral intake: Patient has trouble eating or swallowing medications at this time.  Continue IV fluids for now.  Nausea and vomiting: Antiemetics as needed.  Other comorbidities include dementia, history of stroke,  Diet Order             DIET DYS 2 Room service appropriate? Yes; Fluid consistency: Thin  Diet effective now                             Consultants: None  Procedures: None    Medications:    aspirin EC  81 mg Oral Daily   cholecalciferol  1,000 Units Oral Daily   enoxaparin (LOVENOX) injection  40 mg Subcutaneous Q24H   feeding supplement (NEPRO CARB STEADY)  237 mL Oral BID BM   nirmatrelvir/ritonavir  3 tablet Oral BID   QUEtiapine  50 mg Oral QHS   Continuous Infusions:  ampicillin-sulbactam (UNASYN) IV Stopped (07/18/22 1313)   dextrose 5 % and 0.45% NaCl       Anti-infectives (From admission, onward)    Start     Dose/Rate Route Frequency Ordered Stop   07/17/22 1800  Ampicillin-Sulbactam (UNASYN) 3 g in sodium chloride 0.9 % 100 mL IVPB        3 g 200 mL/hr over 30 Minutes Intravenous Every 6 hours 07/17/22 1306     07/17/22 1300  nirmatrelvir/ritonavir (PAXLOVID) 3 tablet        3 tablet Oral 2 times daily 07/17/22 1251 07/22/22 0759   07/17/22 1030  Ampicillin-Sulbactam (UNASYN) 3 g in sodium chloride 0.9 % 100 mL IVPB        3 g 200 mL/hr over 30 Minutes Intravenous  Once 07/17/22 1026 07/17/22 1215              Family Communication/Anticipated D/C date and plan/Code Status   DVT prophylaxis: enoxaparin (LOVENOX) injection 40 mg Start: 07/17/22 1300     Code Status: DNR  Family Communication: Amy, daughter, at the bedside Disposition Plan: May  need SNF at discharge   Status is: Inpatient Remains inpatient appropriate because: Severe hypoxia       Subjective:   Interval events noted.  He is confused and unable to provide any history.  Objective:    Vitals:   07/18/22 1030 07/18/22 1130 07/18/22 1200 07/18/22 1230  BP: (!) 120/55 124/65 (!) 97/58 (!) 110/54  Pulse: 88 (!) 121 (!) 116 90  Resp: 20     Temp:      TempSrc:      SpO2: 100% 100% 99% 100%  Weight:      Height:       No data found.  No intake or output data in the 24 hours ending 07/18/22 1327 Filed Weights   07/17/22 0949  Weight: 63.5 kg    Exam:  GEN: NAD SKIN: No  rash EYES: EOMI ENT: MMM CV: RRR PULM: Coarse breath sounds, bibasilar rales, no wheezing ABD: soft, ND, NT, +BS CNS: Drowsy but arousable, does not follow commands, neuro exam is difficult to assess EXT: No edema or tenderness        Data Reviewed:   I have personally reviewed following labs and imaging studies:  Labs: Labs show the following:   Basic Metabolic Panel: Recent Labs  Lab 07/17/22 1012 07/18/22 0610  NA 141 142  K 4.6 3.9  CL 105 110  CO2 26 24  GLUCOSE 135* 114*  BUN 14 16  CREATININE 0.86 0.74  CALCIUM 9.3 8.3*   GFR Estimated Creatinine Clearance: 55.1 mL/min (by C-G formula based on SCr of 0.74 mg/dL). Liver Function Tests: Recent Labs  Lab 07/17/22 1012  AST 22  ALT 10  ALKPHOS 95  BILITOT 0.8  PROT 7.8  ALBUMIN 3.5   No results for input(s): "LIPASE", "AMYLASE" in the last 168 hours. No results for input(s): "AMMONIA" in the last 168 hours. Coagulation profile Recent Labs  Lab 07/17/22 1012  INR 1.3*    CBC: Recent Labs  Lab 07/17/22 1146 07/18/22 0610  WBC 15.4* 13.4*  NEUTROABS 14.1*  --   HGB 11.4* 8.6*  HCT 37.1* 28.7*  MCV 90.7 92.6  PLT 407* 290   Cardiac Enzymes: No results for input(s): "CKTOTAL", "CKMB", "CKMBINDEX", "TROPONINI" in the last 168 hours. BNP (last 3 results) No results for input(s): "PROBNP" in the last 8760 hours. CBG: No results for input(s): "GLUCAP" in the last 168 hours. D-Dimer: No results for input(s): "DDIMER" in the last 72 hours. Hgb A1c: No results for input(s): "HGBA1C" in the last 72 hours. Lipid Profile: No results for input(s): "CHOL", "HDL", "LDLCALC", "TRIG", "CHOLHDL", "LDLDIRECT" in the last 72 hours. Thyroid function studies: No results for input(s): "TSH", "T4TOTAL", "T3FREE", "THYROIDAB" in the last 72 hours.  Invalid input(s): "FREET3" Anemia work up: No results for input(s): "VITAMINB12", "FOLATE", "FERRITIN", "TIBC", "IRON", "RETICCTPCT" in the last 72  hours. Sepsis Labs: Recent Labs  Lab 07/17/22 1146 07/17/22 1153 07/18/22 0610  PROCALCITON 0.13  --   --   WBC 15.4*  --  13.4*  LATICACIDVEN 2.1* 1.2  --     Microbiology Recent Results (from the past 240 hour(s))  Culture, blood (Routine x 2)     Status: None (Preliminary result)   Collection Time: 07/17/22 10:12 AM   Specimen: BLOOD LEFT ARM  Result Value Ref Range Status   Specimen Description BLOOD LEFT ARM  Final   Special Requests   Final    BOTTLES DRAWN AEROBIC AND ANAEROBIC Blood Culture adequate volume  Culture   Final    NO GROWTH < 24 HOURS Performed at [facility], [address] [place], [place] [address]    Report Status PENDING  Incomplete  Resp panel by RT-PCR (RSV, Flu A&B, Covid) Anterior Nasal Swab     Status: Abnormal   Collection Time: 07/17/22 10:12 AM   Specimen: Anterior Nasal Swab  Result Value Ref Range Status   SARS Coronavirus 2 by RT PCR POSITIVE (A) NEGATIVE Final    Comment: (NOTE) SARS-CoV-2 target nucleic acids are DETECTED.  The SARS-CoV-2 RNA is generally detectable in upper respiratory specimens during the acute phase of infection. Positive results are indicative of the presence of the identified virus, but do not rule out bacterial infection or co-infection with other pathogens not detected by the test. Clinical correlation with patient history and other diagnostic information is necessary to determine patient infection status. The expected result is Negative.  Fact Sheet for Patients: EntrepreneurPulse.com.au  Fact Sheet for Healthcare Providers: IncredibleEmployment.be  This test is not yet approved or cleared by the [place] FDA and  has been authorized for detection and/or diagnosis of SARS-CoV-2 by FDA under an Emergency Use Authorization (EUA).  This EUA will remain in effect (meaning this test can be used) for the duration of  the COVID-19 declaration under Section  564(b)(1) of the A ct, 21 U.S.C. section 360bbb-3(b)(1), unless the authorization is terminated or revoked sooner.     Influenza A by PCR NEGATIVE NEGATIVE Final   Influenza B by PCR NEGATIVE NEGATIVE Final    Comment: (NOTE) The Xpert Xpress SARS-CoV-2/FLU/RSV plus assay is intended as an aid in the diagnosis of influenza from Nasopharyngeal swab specimens and should not be used as a sole basis for treatment. Nasal washings and aspirates are unacceptable for Xpert Xpress SARS-CoV-2/FLU/RSV testing.  Fact Sheet for Patients: EntrepreneurPulse.com.au  Fact Sheet for Healthcare Providers: IncredibleEmployment.be  This test is not yet approved or cleared by the [place] FDA and has been authorized for detection and/or diagnosis of SARS-CoV-2 by FDA under an Emergency Use Authorization (EUA). This EUA will remain in effect (meaning this test can be used) for the duration of the COVID-19 declaration under Section 564(b)(1) of the Act, 21 U.S.C. section 360bbb-3(b)(1), unless the authorization is terminated or revoked.     Resp Syncytial Virus by PCR NEGATIVE NEGATIVE Final    Comment: (NOTE) Fact Sheet for Patients: EntrepreneurPulse.com.au  Fact Sheet for Healthcare Providers: IncredibleEmployment.be  This test is not yet approved or cleared by the [place] FDA and has been authorized for detection and/or diagnosis of SARS-CoV-2 by FDA under an Emergency Use Authorization (EUA). This EUA will remain in effect (meaning this test can be used) for the duration of the COVID-19 declaration under Section 564(b)(1) of the Act, 21 U.S.C. section 360bbb-3(b)(1), unless the authorization is terminated or revoked.  Performed at [facility], [address] [place], [place] [address]   Culture, blood (Routine x 2)     Status: None (Preliminary result)   Collection Time: 07/17/22 11:46 AM    Specimen: BLOOD  Result Value Ref Range Status   Specimen Description BLOOD LEFT ANTECUBITAL  Final   Special Requests   Final    BOTTLES DRAWN AEROBIC AND ANAEROBIC Blood Culture adequate volume   Culture   Final    NO GROWTH < 24 HOURS Performed at [facility], [address], [place], [place] [address]    Report Status PENDING  Incomplete  Procedures and diagnostic studies:  DG Chest Portable 1 View  Result Date: 07/17/2022 CLINICAL DATA:  Shortness of breath EXAM: PORTABLE CHEST 1 VIEW COMPARISON:  06/08/22 CXR FINDINGS: No pleural effusion. No pneumothorax. Normal cardiac and mediastinal contours. There is likely apical predominant emphysema. Compared to prior exam there is slight interval increase in prominence of interstitial opacities at the bilateral lung bases. No displaced rib fractures. Visualized upper abdomen is unremarkable. IMPRESSION: Slight interval increase in prominence of interstitial opacities at the bilateral lung bases, which could be seen in the setting of small airways disease/bronchitis. Electronically Signed   By: Marin Roberts M.D.   On: 07/17/2022 10:36               LOS: 1 day   Kypton Eltringham  [facility]   Pager on [email or web address]. If 7PM-7AM, please contact night-coverage at [email or web address]     07/18/2022, 1:27 PM

## 2022-07-19 DIAGNOSIS — U071 COVID-19: Secondary | ICD-10-CM | POA: Diagnosis not present

## 2022-07-19 DIAGNOSIS — E43 Unspecified severe protein-calorie malnutrition: Secondary | ICD-10-CM | POA: Insufficient documentation

## 2022-07-19 DIAGNOSIS — J069 Acute upper respiratory infection, unspecified: Secondary | ICD-10-CM | POA: Diagnosis not present

## 2022-07-19 DIAGNOSIS — J9601 Acute respiratory failure with hypoxia: Secondary | ICD-10-CM | POA: Diagnosis not present

## 2022-07-19 DIAGNOSIS — A419 Sepsis, unspecified organism: Secondary | ICD-10-CM | POA: Diagnosis not present

## 2022-07-19 LAB — CBC WITH DIFFERENTIAL/PLATELET
Abs Immature Granulocytes: 0.09 10*3/uL — ABNORMAL HIGH (ref 0.00–0.07)
Basophils Absolute: 0 10*3/uL (ref 0.0–0.1)
Basophils Relative: 0 %
Eosinophils Absolute: 0 10*3/uL (ref 0.0–0.5)
Eosinophils Relative: 0 %
HCT: 32.2 % — ABNORMAL LOW (ref 39.0–52.0)
Hemoglobin: 9.6 g/dL — ABNORMAL LOW (ref 13.0–17.0)
Immature Granulocytes: 1 %
Lymphocytes Relative: 11 %
Lymphs Abs: 1.5 10*3/uL (ref 0.7–4.0)
MCH: 27.9 pg (ref 26.0–34.0)
MCHC: 29.8 g/dL — ABNORMAL LOW (ref 30.0–36.0)
MCV: 93.6 fL (ref 80.0–100.0)
Monocytes Absolute: 0.3 10*3/uL (ref 0.1–1.0)
Monocytes Relative: 2 %
Neutro Abs: 11.7 10*3/uL — ABNORMAL HIGH (ref 1.7–7.7)
Neutrophils Relative %: 86 %
Platelets: 324 10*3/uL (ref 150–400)
RBC: 3.44 MIL/uL — ABNORMAL LOW (ref 4.22–5.81)
RDW: 15 % (ref 11.5–15.5)
WBC: 13.6 10*3/uL — ABNORMAL HIGH (ref 4.0–10.5)
nRBC: 0 % (ref 0.0–0.2)

## 2022-07-19 LAB — C-REACTIVE PROTEIN: CRP: 14 mg/dL — ABNORMAL HIGH (ref <1.0)

## 2022-07-19 LAB — COMPREHENSIVE METABOLIC PANEL
ALT: 7 U/L (ref 0–44)
AST: 18 U/L (ref 15–41)
Albumin: 2.6 g/dL — ABNORMAL LOW (ref 3.5–5.0)
Alkaline Phosphatase: 71 U/L (ref 38–126)
Anion gap: 8 (ref 5–15)
BUN: 19 mg/dL (ref 8–23)
CO2: 27 mmol/L (ref 22–32)
Calcium: 9 mg/dL (ref 8.9–10.3)
Chloride: 110 mmol/L (ref 98–111)
Creatinine, Ser: 0.7 mg/dL (ref 0.61–1.24)
GFR, Estimated: >60 mL/min (ref >60)
Glucose, Bld: 142 mg/dL — ABNORMAL HIGH (ref 70–99)
Potassium: 4 mmol/L (ref 3.5–5.1)
Sodium: 145 mmol/L (ref 135–145)
Total Bilirubin: 0.8 mg/dL (ref 0.3–1.2)
Total Protein: 6.3 g/dL — ABNORMAL LOW (ref 6.5–8.1)

## 2022-07-19 LAB — MAGNESIUM: Magnesium: 2.1 mg/dL (ref 1.7–2.4)

## 2022-07-19 LAB — D-DIMER, QUANTITATIVE: D-Dimer, Quant: 1.26 ug/mL-FEU — ABNORMAL HIGH (ref 0.00–0.50)

## 2022-07-19 LAB — PHOSPHORUS: Phosphorus: 2.9 mg/dL (ref 2.5–4.6)

## 2022-07-19 MED ORDER — ENSURE ENLIVE PO LIQD
237.0000 mL | Freq: Three times a day (TID) | ORAL | Status: DC
  Administered 2022-07-19 – 2022-07-21 (×3): 237 mL via ORAL

## 2022-07-19 MED ORDER — ADULT MULTIVITAMIN W/MINERALS CH
1.0000 | ORAL_TABLET | Freq: Every day | ORAL | Status: DC
  Administered 2022-07-20 – 2022-07-21 (×2): 1 via ORAL
  Filled 2022-07-19 (×4): qty 1

## 2022-07-19 NOTE — Progress Notes (Signed)
Initial Nutrition Assessment  DOCUMENTATION CODES:   Severe malnutrition in context of social or environmental circumstances  INTERVENTION:   Ensure Enlive po TID, each supplement provides 350 kcal and 20 grams of protein.  Magic cup TID with meals, each supplement provides 290 kcal and 9 grams of protein  MVI po daily   Assist with meals  Pt at high refeed risk; recommend monitor potassium, magnesium and phosphorus labs daily until stable  NUTRITION DIAGNOSIS:   Severe Malnutrition related to social / environmental circumstances (dementia, advanced age) as evidenced by severe fat depletion, severe muscle depletion.  GOAL:   Patient will meet greater than or equal to 90% of their needs  MONITOR:   PO intake, Supplement acceptance, Labs, Weight trends, Skin, I & O's  REASON FOR ASSESSMENT:   Malnutrition Screening Tool    ASSESSMENT:   89 y.o. male with medical history significant of dementia, hypertension, hyperlipidemia, diastolic CHF, stroke, GERD, dementia, skin cancer, prostate cancer, colon cancer, kidney stone, aspiration pneumonia and colitis who is admitted with COVID 19.  Visited pt's room today. Pt is unable to provide any nutrition related history but does report that he is feeling ok today and shakes his head "yes" that he likes Ensure. Pt's granddaughter is at bedside feeding pt an Ensure which he had almost finished. Granddaughter reports pt with poor appetite and oral intake at baseline but reports that pt does drink Ensure daily at home. Pt is reportedly holding food in his mouth and not swallowing. SLP evaluation is pending. Recommend continue Ensure supplements. RD will add Magic Cups and MVI to help pt meet his estimated needs. Pt is at high refeed risk. Per chart, pt appears weight stable pta. Palliative care consult is pending.   Medications reviewed and include: aspirin, D3, dexamethasone, lovenox, unasyn   Labs reviewed: K 4.0 wnl, P 2.9 wnl, Mg 2.1  wnl Wbc- 13.6(H), Hgb 9.6(L), Hct 32.2(L)  NUTRITION - FOCUSED PHYSICAL EXAM:  Flowsheet Row Most Recent Value  Orbital Region Severe depletion  Upper Arm Region Severe depletion  Thoracic and Lumbar Region Severe depletion  Buccal Region Severe depletion  Temple Region Severe depletion  Clavicle Bone Region Severe depletion  Clavicle and Acromion Bone Region Severe depletion  Scapular Bone Region Severe depletion  Dorsal Hand Severe depletion  Patellar Region Severe depletion  Anterior Thigh Region Severe depletion  Posterior Calf Region Severe depletion  Edema (RD Assessment) None  Hair Reviewed  Eyes Reviewed  Mouth Reviewed  Skin Reviewed  Nails Reviewed   Diet Order:   Diet Order             DIET DYS 2 Room service appropriate? Yes; Fluid consistency: Thin  Diet effective now                  EDUCATION NEEDS:   No education needs have been identified at this time  Skin:  Skin Assessment: Reviewed RN Assessment  Last BM:  1/2  Height:   Ht Readings from Last 1 Encounters:  07/18/22 '5\' 9"'$  (1.753 m)    Weight:   Wt Readings from Last 1 Encounters:  07/18/22 66.8 kg    Ideal Body Weight:  72.7 kg  BMI:  Body mass index is 21.75 kg/m.  Estimated Nutritional Needs:   Kcal:  1700-2000kcal/day  Protein:  85-100g/day  Fluid:  1.7-2.0L/day  Koleen Distance MS, RD, LDN Please refer to [facility] for RD and/or RD on-call/weekend/after hours pager

## 2022-07-19 NOTE — Plan of Care (Signed)
  Problem: Respiratory: Goal: Ability to maintain adequate ventilation will improve Outcome: Progressing   Problem: Health Behavior/Discharge Planning: Goal: Ability to manage health-related needs will improve Outcome: Progressing   Problem: Clinical Measurements: Goal: Cardiovascular complication will be avoided Outcome: Progressing   Problem: Elimination: Goal: Will not experience complications related to urinary retention Outcome: Progressing   Problem: Pain Managment: Goal: General experience of comfort will improve Outcome: Progressing   Problem: Safety: Goal: Ability to remain free from injury will improve Outcome: Progressing

## 2022-07-19 NOTE — Progress Notes (Signed)
Progress Note    Todd Trujillo.  VVO:[id number] DOB: June 09, 1930  DOA: 07/17/2022 PCP: Einar Pheasant, MD      Brief Narrative:    Medical records reviewed and are as summarized below:  Todd Trujillo. is a 89 y.o. male male with medical history significant for recent discharge from the hospital on 06/11/2022 for aspiration pneumonia, hypoxia and acute colitis; dementia, hypertension, hyperlipidemia, chronic diastolic CHF, stroke, GERD, dementia, skin cancer, prostate cancer, kidney stone, aspiration pneumonia, colitis, who presented to the hospital with cough, shortness of breath and vomiting.  Her daughter said that patient had been ill and vomiting since Thanksgiving and they attributed this to aspiration pneumonia.  He tested positive for COVID-19 infection a day prior to admission.       Assessment/Plan:   Principal Problem:   Acute respiratory disease due to COVID-19 virus Active Problems:   Acute respiratory failure with hypoxia (HCC)   Aspiration pneumonia (HCC)   Severe sepsis (HCC)   Dementia without behavioral disturbance (HCC)   HTN (hypertension)   History of CVA (cerebrovascular accident)   Nausea & vomiting    Body mass index is 21.75 kg/m.   Severe sepsis secondary to COVID-19 pneumonia, recent aspiration pneumonia: Continue IV Unasyn.  Use Paxlovid if he is able to swallow.  Acute hypoxic respiratory failure: He is still requiring oxygen via heated humidified high flow nasal cannula (FiO2 35% at 40 L/min).  Wean off oxygen as able.   Hypotension: Resolved.  IV fluids has been discontinued.  Poor oral intake, dysphagia: Consult speech therapist.  Nausea and vomiting: Antiemetics as needed.  Other comorbidities include dementia, history of stroke  Consult palliative care team because of acute Illness, advanced age and multiple comorbidities.    Diet Order             DIET DYS 2 Room service appropriate? Yes; Fluid  consistency: Thin  Diet effective now                            Consultants: None  Procedures: None    Medications:    aspirin EC  81 mg Oral Daily   cholecalciferol  1,000 Units Oral Daily   dexamethasone (DECADRON) injection  6 mg Intravenous Daily   enoxaparin (LOVENOX) injection  40 mg Subcutaneous Q24H   feeding supplement (NEPRO CARB STEADY)  237 mL Oral BID BM   nirmatrelvir/ritonavir  3 tablet Oral BID   QUEtiapine  25 mg Oral QHS   Continuous Infusions:  ampicillin-sulbactam (UNASYN) IV 3 g (07/19/22 1336)     Anti-infectives (From admission, onward)    Start     Dose/Rate Route Frequency Ordered Stop   07/17/22 1800  Ampicillin-Sulbactam (UNASYN) 3 g in sodium chloride 0.9 % 100 mL IVPB        3 g 200 mL/hr over 30 Minutes Intravenous Every 6 hours 07/17/22 1306     07/17/22 1300  nirmatrelvir/ritonavir (PAXLOVID) 3 tablet        3 tablet Oral 2 times daily 07/17/22 1251 07/22/22 0759   07/17/22 1030  Ampicillin-Sulbactam (UNASYN) 3 g in sodium chloride 0.9 % 100 mL IVPB        3 g 200 mL/hr over 30 Minutes Intravenous  Once 07/17/22 1026 07/17/22 1215              Family Communication/Anticipated D/C date and plan/Code Status   DVT prophylaxis: enoxaparin (LOVENOX)  injection 40 mg Start: 07/17/22 1300     Code Status: DNR  Family Communication: Wife at the bedside Disposition Plan: May need SNF at discharge   Status is: Inpatient Remains inpatient appropriate because: Severe hypoxia       Subjective:   Interval events noted.  He is confused and unable to provide any history.  According to his nurse, he has not been able to swallow his pills or food.  He pockets pills and food in his mouth.  His wife was at the bedside.  Objective:    Vitals:   07/19/22 0453 07/19/22 0807 07/19/22 0849 07/19/22 1218  BP: (!) 145/86 (!) 148/80  120/62  Pulse: 81 94  82  Resp: '18 17  18  '$ Temp: 97.7 F (36.5 C) 98.2 F (36.8 C)   98 F (36.7 C)  TempSrc: Axillary     SpO2: 100% 99% 100% 99%  Weight:      Height:       No data found.   Intake/Output Summary (Last 24 hours) at 07/19/2022 1337 Last data filed at 07/19/2022 1218 Gross per 24 hour  Intake 340 ml  Output 15 ml  Net 325 ml   Filed Weights   07/17/22 0949 07/18/22 2153  Weight: 63.5 kg 66.8 kg    Exam:  GEN: NAD SKIN: Warm and dry EYES: EOMI ENT: MMM CV: RRR PULM: Coarse breath sounds, bibasilar rales, no wheezing ABD: soft, ND, NT, +BS CNS: More alert today but still confused, non focal EXT: No edema or tenderness      Data Reviewed:   I have personally reviewed following labs and imaging studies:  Labs: Labs show the following:   Basic Metabolic Panel: Recent Labs  Lab 07/17/22 1012 07/18/22 0610 07/19/22 0621  NA 141 142 145  K 4.6 3.9 4.0  CL 105 110 110  CO2 '26 24 27  '$ GLUCOSE 135* 114* 142*  BUN '14 16 19  '$ CREATININE 0.86 0.74 0.70  CALCIUM 9.3 8.3* 9.0  MG  --   --  2.1  PHOS  --   --  2.9   GFR Estimated Creatinine Clearance: 58 mL/min (by C-G formula based on SCr of 0.7 mg/dL). Liver Function Tests: Recent Labs  Lab 07/17/22 1012 07/19/22 0621  AST 22 18  ALT 10 7  ALKPHOS 95 71  BILITOT 0.8 0.8  PROT 7.8 6.3*  ALBUMIN 3.5 2.6*   No results for input(s): "LIPASE", "AMYLASE" in the last 168 hours. No results for input(s): "AMMONIA" in the last 168 hours. Coagulation profile Recent Labs  Lab 07/17/22 1012  INR 1.3*    CBC: Recent Labs  Lab 07/17/22 1146 07/18/22 0610 07/19/22 0621  WBC 15.4* 13.4* 13.6*  NEUTROABS 14.1*  --  11.7*  HGB 11.4* 8.6* 9.6*  HCT 37.1* 28.7* 32.2*  MCV 90.7 92.6 93.6  PLT 407* 290 324   Cardiac Enzymes: No results for input(s): "CKTOTAL", "CKMB", "CKMBINDEX", "TROPONINI" in the last 168 hours. BNP (last 3 results) No results for input(s): "PROBNP" in the last 8760 hours. CBG: No results for input(s): "GLUCAP" in the last 168 hours. D-Dimer: Recent Labs     07/19/22 0621  DDIMER 1.26*   Hgb A1c: No results for input(s): "HGBA1C" in the last 72 hours. Lipid Profile: No results for input(s): "CHOL", "HDL", "LDLCALC", "TRIG", "CHOLHDL", "LDLDIRECT" in the last 72 hours. Thyroid function studies: No results for input(s): "TSH", "T4TOTAL", "T3FREE", "THYROIDAB" in the last 72 hours.  Invalid input(s): "FREET3" Anemia  work up: No results for input(s): "VITAMINB12", "FOLATE", "FERRITIN", "TIBC", "IRON", "RETICCTPCT" in the last 72 hours. Sepsis Labs: Recent Labs  Lab 07/17/22 1146 07/17/22 1153 07/18/22 0610 07/19/22 0621  PROCALCITON 0.13  --   --   --   WBC 15.4*  --  13.4* 13.6*  LATICACIDVEN 2.1* 1.2  --   --     Microbiology Recent Results (from the past 240 hour(s))  Culture, blood (Routine x 2)     Status: None (Preliminary result)   Collection Time: 07/17/22 10:12 AM   Specimen: BLOOD LEFT ARM  Result Value Ref Range Status   Specimen Description BLOOD LEFT ARM  Final   Special Requests   Final    BOTTLES DRAWN AEROBIC AND ANAEROBIC Blood Culture adequate volume   Culture   Final    NO GROWTH 2 DAYS Performed at [facility], [address], [place],  [address]    Report Status PENDING  Incomplete  Resp panel by RT-PCR (RSV, Flu A&B, Covid) Anterior Nasal Swab     Status: Abnormal   Collection Time: 07/17/22 10:12 AM   Specimen: Anterior Nasal Swab  Result Value Ref Range Status   SARS Coronavirus 2 by RT PCR POSITIVE (A) NEGATIVE Final    Comment: (NOTE) SARS-CoV-2 target nucleic acids are DETECTED.  The SARS-CoV-2 RNA is generally detectable in upper respiratory specimens during the acute phase of infection. Positive results are indicative of the presence of the identified virus, but do not rule out bacterial infection or co-infection with other pathogens not detected by the test. Clinical correlation with patient history and other diagnostic information is necessary to determine  patient infection status. The expected result is Negative.  Fact Sheet for Patients: EntrepreneurPulse.com.au  Fact Sheet for Healthcare Providers: IncredibleEmployment.be  This test is not yet approved or cleared by the [place] FDA and  has been authorized for detection and/or diagnosis of SARS-CoV-2 by FDA under an Emergency Use Authorization (EUA).  This EUA will remain in effect (meaning this test can be used) for the duration of  the COVID-19 declaration under Section 564(b)(1) of the A ct, 21 U.S.C. section 360bbb-3(b)(1), unless the authorization is terminated or revoked sooner.     Influenza A by PCR NEGATIVE NEGATIVE Final   Influenza B by PCR NEGATIVE NEGATIVE Final    Comment: (NOTE) The Xpert Xpress SARS-CoV-2/FLU/RSV plus assay is intended as an aid in the diagnosis of influenza from Nasopharyngeal swab specimens and should not be used as a sole basis for treatment. Nasal washings and aspirates are unacceptable for Xpert Xpress SARS-CoV-2/FLU/RSV testing.  Fact Sheet for Patients: EntrepreneurPulse.com.au  Fact Sheet for Healthcare Providers: IncredibleEmployment.be  This test is not yet approved or cleared by the [place] FDA and has been authorized for detection and/or diagnosis of SARS-CoV-2 by FDA under an Emergency Use Authorization (EUA). This EUA will remain in effect (meaning this test can be used) for the duration of the COVID-19 declaration under Section 564(b)(1) of the Act, 21 U.S.C. section 360bbb-3(b)(1), unless the authorization is terminated or revoked.     Resp Syncytial Virus by PCR NEGATIVE NEGATIVE Final    Comment: (NOTE) Fact Sheet for Patients: EntrepreneurPulse.com.au  Fact Sheet for Healthcare Providers: IncredibleEmployment.be  This test is not yet approved or cleared by the [place] FDA and has been  authorized for detection and/or diagnosis of SARS-CoV-2 by FDA under an Emergency Use Authorization (EUA). This EUA will remain in effect (meaning this test can be  used) for the duration of the COVID-19 declaration under Section 564(b)(1) of the Act, 21 U.S.C. section 360bbb-3(b)(1), unless the authorization is terminated or revoked.  Performed at [facility], [address] [place], [place] [address]   Culture, blood (Routine x 2)     Status: None (Preliminary result)   Collection Time: 07/17/22 11:46 AM   Specimen: BLOOD  Result Value Ref Range Status   Specimen Description BLOOD LEFT ANTECUBITAL  Final   Special Requests   Final    BOTTLES DRAWN AEROBIC AND ANAEROBIC Blood Culture adequate volume   Culture   Final    NO GROWTH 2 DAYS Performed at [facility], [address] [place], [place] [address]    Report Status PENDING  Incomplete    Procedures and diagnostic studies:  No results found.             LOS: 2 days   Raygen Linquist  [facility]   Pager on [email or web address]. If 7PM-7AM, please contact night-coverage at [email or web address]     07/19/2022, 1:37 PM

## 2022-07-20 ENCOUNTER — Telehealth: Payer: Self-pay | Admitting: Internal Medicine

## 2022-07-20 DIAGNOSIS — Z515 Encounter for palliative care: Secondary | ICD-10-CM

## 2022-07-20 DIAGNOSIS — J069 Acute upper respiratory infection, unspecified: Secondary | ICD-10-CM | POA: Diagnosis not present

## 2022-07-20 DIAGNOSIS — J9601 Acute respiratory failure with hypoxia: Secondary | ICD-10-CM | POA: Diagnosis not present

## 2022-07-20 DIAGNOSIS — Z7189 Other specified counseling: Secondary | ICD-10-CM

## 2022-07-20 DIAGNOSIS — U071 COVID-19: Secondary | ICD-10-CM | POA: Diagnosis not present

## 2022-07-20 DIAGNOSIS — A419 Sepsis, unspecified organism: Secondary | ICD-10-CM | POA: Diagnosis not present

## 2022-07-20 LAB — CBC WITH DIFFERENTIAL/PLATELET
Abs Immature Granulocytes: 0.18 10*3/uL — ABNORMAL HIGH (ref 0.00–0.07)
Basophils Absolute: 0 10*3/uL (ref 0.0–0.1)
Basophils Relative: 0 %
Eosinophils Absolute: 0 10*3/uL (ref 0.0–0.5)
Eosinophils Relative: 0 %
HCT: 30.8 % — ABNORMAL LOW (ref 39.0–52.0)
Hemoglobin: 9.1 g/dL — ABNORMAL LOW (ref 13.0–17.0)
Immature Granulocytes: 1 %
Lymphocytes Relative: 8 %
Lymphs Abs: 1.5 10*3/uL (ref 0.7–4.0)
MCH: 27.3 pg (ref 26.0–34.0)
MCHC: 29.5 g/dL — ABNORMAL LOW (ref 30.0–36.0)
MCV: 92.5 fL (ref 80.0–100.0)
Monocytes Absolute: 0.8 10*3/uL (ref 0.1–1.0)
Monocytes Relative: 4 %
Neutro Abs: 15.8 10*3/uL — ABNORMAL HIGH (ref 1.7–7.7)
Neutrophils Relative %: 87 %
Platelets: 325 10*3/uL (ref 150–400)
RBC: 3.33 MIL/uL — ABNORMAL LOW (ref 4.22–5.81)
RDW: 15 % (ref 11.5–15.5)
WBC: 18.2 10*3/uL — ABNORMAL HIGH (ref 4.0–10.5)
nRBC: 0 % (ref 0.0–0.2)

## 2022-07-20 LAB — COMPREHENSIVE METABOLIC PANEL
ALT: 8 U/L (ref 0–44)
AST: 18 U/L (ref 15–41)
Albumin: 2.5 g/dL — ABNORMAL LOW (ref 3.5–5.0)
Alkaline Phosphatase: 62 U/L (ref 38–126)
Anion gap: 5 (ref 5–15)
BUN: 26 mg/dL — ABNORMAL HIGH (ref 8–23)
CO2: 28 mmol/L (ref 22–32)
Calcium: 8.7 mg/dL — ABNORMAL LOW (ref 8.9–10.3)
Chloride: 111 mmol/L (ref 98–111)
Creatinine, Ser: 0.76 mg/dL (ref 0.61–1.24)
GFR, Estimated: >60 mL/min (ref >60)
Glucose, Bld: 161 mg/dL — ABNORMAL HIGH (ref 70–99)
Potassium: 3.7 mmol/L (ref 3.5–5.1)
Sodium: 144 mmol/L (ref 135–145)
Total Bilirubin: 0.7 mg/dL (ref 0.3–1.2)
Total Protein: 5.7 g/dL — ABNORMAL LOW (ref 6.5–8.1)

## 2022-07-20 LAB — C-REACTIVE PROTEIN: CRP: 5.3 mg/dL — ABNORMAL HIGH (ref <1.0)

## 2022-07-20 NOTE — TOC Initial Note (Signed)
Transition of Care (TOC) - Initial/Assessment Note    Patient Details  Name: Todd Trujillo. MRN: [id number] Date of Birth: 01-04-1930  Transition of Care [facility]) CM/SW Contact:    [name], LCSW Phone Number: 07/20/2022, 11:20 AM  Clinical Narrative:                  Patient from home with spouse, active with [facility].   Dispo tbd, palliative consulted at this time.     Expected Discharge Plan:  (TBD) Barriers to Discharge: Continued Medical Work up   Patient Goals and CMS Choice Patient states their goals for this hospitalization and ongoing recovery are:: to go home CMS Medicare.gov Compare Post Acute Care list provided to:: Patient   [place] ownership interest in [facility].provided to:: Patient    Expected Discharge Plan and Services                                              Prior Living Arrangements/Services                       Activities of Daily Living Home Assistive Devices/Equipment: Gilford Rile (specify type) (two tennis ball inte front) ADL Screening (condition at time of admission) Patient's cognitive ability adequate to safely complete daily activities?: No Is the patient deaf or have difficulty hearing?: Yes Does the patient have difficulty seeing, even when wearing glasses/contacts?: No Does the patient have difficulty concentrating, remembering, or making decisions?: Yes Patient able to express need for assistance with ADLs?: Yes Does the patient have difficulty dressing or bathing?: Yes Independently performs ADLs?: No Communication: Independent Is this a change from baseline?: Pre-admission baseline Dressing (OT): Needs assistance Is this a change from baseline?: Pre-admission baseline Grooming: Needs assistance Is this a change from baseline?: Pre-admission baseline Feeding: Needs assistance Is this a change from baseline?: Pre-admission baseline Bathing: Needs assistance Is this a  change from baseline?: Pre-admission baseline Toileting: Needs assistance Is this a change from baseline?: Pre-admission baseline In/Out Bed: Needs assistance Is this a change from baseline?: Pre-admission baseline Walks in Home: Needs assistance Is this a change from baseline?: Pre-admission baseline Does the patient have difficulty walking or climbing stairs?: Yes Weakness of Legs: Both Weakness of Arms/Hands: None  Permission Sought/Granted                  Emotional Assessment              Admission diagnosis:  Aspiration pneumonia of both lower lobes, unspecified aspiration pneumonia type ([place]) [J69.0] Sepsis with acute respiratory failure without septic shock, due to unspecified organism, unspecified whether hypoxia or hypercapnia present ([place]) [A41.9, R65.20, J96.00] Acute respiratory disease due to COVID-19 virus [U07.1, J06.9] COVID-19 [U07.1] Patient Active Problem List   Diagnosis Date Noted   Protein-calorie malnutrition, severe 07/19/2022   Acute respiratory disease due to COVID-19 virus 07/17/2022   Severe sepsis ([place]) 07/17/2022   Acute respiratory failure with hypoxia ([place]) 07/17/2022   Nausea & vomiting 07/17/2022   Rash 07/09/2022   Nausea and vomiting 06/09/2022   Acute colitis 06/08/2022   Hematuria 06/08/2022   Thickened nails 02/18/2022   Open wound of heel 10/21/2021   Skin lesion 10/21/2021   Cancer of right colon ([place]) 10/13/2021   Colonic mass 10/08/2021   Iron deficiency anemia 10/08/2021  Vascular dementia ([place]) 09/15/2021   Benign essential hypertension 09/15/2021   Cerebral infarction, unspecified ([place]) 09/15/2021   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   History of subdural hematoma 08/19/2021   Aspiration pneumonia ([place]) 06/11/2021   Dementia without behavioral disturbance ([place]) 06/11/2021   Right femoral fracture ([place]) 06/06/2021   Leucocytosis 06/06/2021   Obsessive behavior 03/11/2021   History of CVA  (cerebrovascular accident) 09/21/2020   Renal mass 09/21/2020   Weakness 09/21/2020   Cerebral atrophy ([place]) 09/15/2020   Cerebrovascular accident (CVA) ([place])    Left facial numbness 09/12/2019   HTN (hypertension) 09/12/2019   GERD (gastroesophageal reflux disease) 09/12/2019   Arthritis 08/13/2018   Benign prostatic hyperplasia 08/13/2018   Diverticulosis 08/13/2018   History of anemia 08/13/2018   History of colonic polyps 08/13/2018   History of nephrolithiasis 08/13/2018   History of pneumonia 08/13/2018   Unintentional weight loss 12/15/2016   Healthcare maintenance 12/14/2016   Skin lesion of chest wall 09/23/2016   Symptomatic anemia 09/17/2016   Hypercholesterolemia 09/17/2016   Incomplete emptying of bladder 02/02/2016   TIA (transient ischemic attack) 01/08/2016   Macular degeneration 01/08/2016   History of prostate cancer 01/08/2016   PVD (peripheral vascular disease) ([place]) 10/12/2015   SCC (squamous cell carcinoma) 01/24/2015   Bladder outlet obstruction 04/15/2013   Acquired cyst of kidney 04/16/2012   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/2012   PCP:  Einar Pheasant, MD Pharmacy:   [place], [place] [address] [place] [address] Phone: [phone] Fax: [phone]  [place], [address] [place] [address] Phone: [phone] Fax: [phone]     Social Determinants of Health (SDOH) Social History: SDOH Screenings   Food Insecurity: No Food Insecurity (07/19/2022)  Housing: Low Risk  (06/08/2022)  Transportation Needs: No Transportation Needs (07/19/2022)  Recent Concern: Transportation Needs - Unmet Transportation Needs (06/08/2022)  Utilities: Not At Risk (07/19/2022)  Depression (PHQ2-9): Low Risk  (06/21/2022)  Tobacco Use: Low Risk  (07/17/2022)   SDOH Interventions:     Readmission Risk Interventions     No data to display

## 2022-07-20 NOTE — Progress Notes (Signed)
Manufacturing engineer [facility]) Hospital Liaison Note  Received request from PMT provider/ Aniceto Boss, for hospice services at home after discharge.   Spoke with daughter/Leanna to initiate education related to hospice philosophy, services, and team approach to care.  At this time, family is not receptive to [employer] but are interested in [facility]. [place] hospice team to sign-off.   Please call with any questions/concerns.    Thank you for the opportunity to participate in this patient's care.   Daphene Calamity, MSW [facility] Liaison  [phone]

## 2022-07-20 NOTE — Evaluation (Signed)
Occupational Therapy Evaluation Patient Details Name: Todd Trujillo. MRN: [id number] DOB: 02/13/1930 Today's Date: 07/20/2022   History of Present Illness Todd Trujillo. is a 89 y.o. male with past medical history of hypertension, stroke, vascular dementia, and recurrent aspiration who presents to the ED complaining of shortness of breath.  Wife states that he initially vomited 5 days prior to admission, has appeared to gradually worsen since then.  He has had a cough where it sounds like something has been stuck in his throat, had another small episode of vomiting. Patient tested positive for Covid the day before admission.   Clinical Impression   Patient received for OT evaluation. See flowsheet below for details of function. Generally, patient requiring MAX A x2 for bed mobility, MAX A x2 at RW for standing, and MAX A for ADLs. Discussion with pt's wife about d/c disposition; wife states they have needed assistance at home and prefer for pt to d/c home.  Patient will benefit from continued OT while in acute care.      Recommendations for follow up therapy are one component of a multi-disciplinary discharge planning process, led by the attending physician.  Recommendations may be updated based on patient status, additional functional criteria and insurance authorization.   Follow Up Recommendations  Home health OT     Assistance Recommended at Discharge Frequent or constant Supervision/Assistance  Patient can return home with the following Two people to help with walking and/or transfers;Two people to help with bathing/dressing/bathroom;Assistance with feeding;Assistance with cooking/housework;Direct supervision/assist for medications management;Direct supervision/assist for financial management;Assist for transportation;Help with stairs or ramp for entrance    Functional Status Assessment  Patient has had a recent decline in their functional status and  demonstrates the ability to make significant improvements in function in a reasonable and predictable amount of time.  Equipment Recommendations  None recommended by OT    Recommendations for Other Services       Precautions / Restrictions Precautions Precautions: Fall;Other (comment) (Isolation precautions for Covid) Restrictions Weight Bearing Restrictions: No      Mobility Bed Mobility Overal bed mobility: Needs Assistance (Simultaneous filing. User may not have seen previous data.)             General bed mobility comments: MAX A x2 for bed mobility; once seated EOB requiring MOD A 2/2 L side lean (Simultaneous filing. User may not have seen previous data.)    Transfers Overall transfer level: Needs assistance (Simultaneous filing. User may not have seen previous data.) Equipment used: Rolling walker (2 wheels) (Simultaneous filing. User may not have seen previous data.)               General transfer comment: posterior lean; MAX A x2 standing for only a few seconds. (Simultaneous filing. User may not have seen previous data.)      Balance Overall balance assessment: Needs assistance Sitting-balance support: Bilateral upper extremity supported, Feet supported Sitting balance-Leahy Scale: Poor   Postural control: Left lateral lean Standing balance support: Bilateral upper extremity supported Standing balance-Leahy Scale: Poor Standing balance comment: posterior lean; standing x2 with RW                           ADL either performed or assessed with clinical judgement   ADL Overall ADL's : Needs assistance/impaired Eating/Feeding: Moderate assistance;Bed level (to take a bite of pudding; hand-under-hand assistance)   Grooming: Maximal assistance;Bed level Grooming Details (indicate cue type and  reason): OT provided a wet wipe and provided visual, verbal, tactile cues for pt to wipe hands; pt unable to complete the task without significant  assistance.   Upper Body Bathing Details (indicate cue type and reason): anticipate dependent   Lower Body Bathing Details (indicate cue type and reason): anticipate dependent   Upper Body Dressing Details (indicate cue type and reason): anticipate MAX A   Lower Body Dressing Details (indicate cue type and reason): anticipate dependent   Toilet Transfer Details (indicate cue type and reason): unable to t/f today; would require dependent lift today. Toileting- Clothing Manipulation and Hygiene: Total assistance;Bed level Toileting - Clothing Manipulation Details (indicate cue type and reason): dependent hygiene for bowel incontinence today at bed level; dependent donning brief; wearing primofit. Tub/ Shower Transfer: Total assistance Tub/Shower Transfer Details (indicate cue type and reason): anticipated Functional mobility during ADLs: +2 for physical assistance;Rolling walker (2 wheels) (only able to stand) General ADL Comments: significant assistance required at baseline and currently     Vision         Perception     Praxis      Pertinent Vitals/Pain Pain Assessment Pain Assessment: PAINAD Breathing: normal Negative Vocalization: none Facial Expression: smiling or inexpressive Body Language: relaxed Consolability: no need to console PAINAD Score: 0     Hand Dominance     Extremity/Trunk Assessment Upper Extremity Assessment Upper Extremity Assessment: Generalized weakness   Lower Extremity Assessment Lower Extremity Assessment: Generalized weakness       Communication Communication Communication: HOH   Cognition                                             General Comments  cognition limiting performance. Discussed d/c with pt's wife; she is in agreement with return to home.    Exercises     Shoulder Instructions      Home Living Family/patient expects to be discharged to:: Private residence Living Arrangements: Spouse/significant  other Available Help at Discharge: Family;Personal care attendant;Available 24 hours/day Type of Home: House Home Access: Stairs to enter [employer] of Steps: 3 Entrance Stairs-Rails: Right;Left Home Layout: Two level;Able to live on main level with bedroom/bathroom     Bathroom Shower/Tub: Occupational psychologist: Standard     Home Equipment: Conservation officer, nature (2 wheels);Transport chair;Hospital bed;BSC/3in1;Grab bars - tub/shower;Other (comment) (non-mechanical sit to stand lift)   Additional Comments: Home set up obtained from EMR. DME information obtained from daughter at bedside. Pt has 2 hour aide assist in morning and 2 hour aide assist in evening; family assist at other times.      Prior Functioning/Environment Prior Level of Function : Needs assist  Cognitive Assist : Mobility (cognitive);ADLs (cognitive) Mobility (Cognitive): Step by step cues ADLs (Cognitive): Step by step cues Physical Assist : Mobility (physical);ADLs (physical) Mobility (physical): Bed mobility;Transfers;Gait ADLs (physical): Feeding;Grooming;Bathing;Dressing;Toileting;IADLs Mobility Comments: Transfers modA and ambulating ~10' in his home. ADLs Comments: Requires assist from family and PCA for ADL's/IADL's.        OT Problem List: Decreased strength;Decreased activity tolerance;Impaired balance (sitting and/or standing);Decreased knowledge of use of DME or AE      OT Treatment/Interventions: Self-care/ADL training;Therapeutic exercise;Therapeutic activities    OT Goals(Current goals can be found in the care plan section) Acute Rehab OT Goals Patient Stated Goal: pt unable to state; wife states goal is to go home  OT Goal Formulation: With family Time For Goal Achievement: 08/03/22 Potential to Achieve Goals: Fair ADL Goals Pt Will Perform Eating: with min guard assist;sitting Pt Will Perform Grooming: with min assist;sitting Pt Will Perform Lower Body Dressing: with mod  assist;sit to/from stand Pt Will Transfer to Toilet: with mod assist;ambulating  OT Frequency: Min 2X/week    Co-evaluation   Reason for Co-Treatment: Complexity of the patient's impairments (multi-system involvement);Necessary to address cognition/behavior during functional activity;For patient/therapist safety PT goals addressed during session: Mobility/safety with mobility;Balance;Proper use of DME OT goals addressed during session: ADL's and self-care      AM-PAC OT "6 Clicks" Daily Activity     Outcome Measure Help from another person eating meals?: A Lot Help from another person taking care of personal grooming?: A Lot Help from another person toileting, which includes using toliet, bedpan, or urinal?: Total Help from another person bathing (including washing, rinsing, drying)?: A Lot Help from another person to put on and taking off regular upper body clothing?: A Lot Help from another person to put on and taking off regular lower body clothing?: Total 6 Click Score: 10   End of Session Equipment Utilized During Treatment: Rolling walker (2 wheels) Nurse Communication: Mobility status  Activity Tolerance: Patient limited by fatigue Patient left: in bed;with bed alarm set;with family/visitor present  OT Visit Diagnosis: Unsteadiness on feet (R26.81)                Time: 0388-8280 OT Time Calculation (min): 32 min Charges:  OT General Charges $OT Visit: 1 Visit OT Evaluation $OT Eval Moderate Complexity: 1 Mod OT Treatments $Therapeutic Activity: 8-22 mins  Waymon Amato, MS, OTR/L   Vania Rea 07/20/2022, 4:21 PM

## 2022-07-20 NOTE — Progress Notes (Addendum)
I spoke to [place], daughter, at 3:29 PM today regarding goals of care.  Todd Trujillo, speech therapist, is concerned that patient is at high risk for aspiration and recommended NPO.  However, Todd Trujillo, said they do not want to deny him food.  She wants patient to have a diet regardless of the risk of aspiration.  She said she is not ready for hospice or comfort care.  She understands that feeding the patient can lead to aspiration, acute respiratory failure and death, even as early as within 24 hours.  Continue with dysphagia 2 diet for now.    Interdisciplinary Goals of Care Family Meeting   Date carried out: 07/20/2022  Location of the meeting: Phone conference  Member's involved: Physician and Family Member or next of kin  Durable Power of Attorney or acting medical decision maker: Plan discussed with Todd Trujillo, daughter  Discussion: We discussed goals of care for [name]. .    Code status:   Code Status: DNR, DNI  Disposition: Continue current acute care  Time spent for the meeting: 74 minutes    Jennye Boroughs, MD  07/20/2022, 3:47 PM

## 2022-07-20 NOTE — Progress Notes (Signed)
[facility]) Hospital Liaison note:  Notified by Quinn Axe, NP of request for [facility] Palliative Care services. TOC aware. Will continue to follow for disposition.  Please call with any outpatient palliative questions or concerns.  Thank you for the opportunity to participate in this patient's care.  Thank you, Lorelee Market, LPN [facility] Liaison [phone]

## 2022-07-20 NOTE — Consult Note (Addendum)
Pharmacy Antibiotic Note  Todd Trujillo. is a 89 y.o. male admitted on 07/17/2022 with complaint of shortness of breath.  Patient is COVID-19 positive.  Pharmacy has been consulted for Unasyn dosing for aspiration pneumonia. Oxygen requirements improving and procal 0.12.  WBC elevated possibly due to steroids.   Plan: Day 4 of abx.  Continue Unasyn 3 grams IV every 6 hours. Recommend a 5 day course of therapy.    Height: '5\' 9"'$  (175.3 cm) Weight: 63.9 kg (140 lb 12.8 oz) IBW/kg (Calculated) : 70.7  Temp (24hrs), Avg:97.9 F (36.6 C), Min:97.4 F (36.3 C), Max:99.1 F (37.3 C)  Recent Labs  Lab 07/17/22 1012 07/17/22 1146 07/17/22 1153 07/18/22 0610 07/19/22 0621 07/20/22 0710  WBC  --  15.4*  --  13.4* 13.6* 18.2*  CREATININE 0.86  --   --  0.74 0.70 0.76  LATICACIDVEN  --  2.1* 1.2  --   --   --      Estimated Creatinine Clearance: 55.5 mL/min (by C-G formula based on SCr of 0.76 mg/dL).    Allergies  Allergen Reactions   Codeine Nausea And Vomiting and Nausea Only    Other reaction(s): Vomiting    Antimicrobials this admission: Unasyn 1/2 >>    Dose adjustments this admission: N/A  Microbiology results: 1/2 BCx: NGTD  Thank you for allowing pharmacy to be a part of this patient's care.  Oswald Hillock, PharmD 07/20/2022 9:59 AM

## 2022-07-20 NOTE — Progress Notes (Signed)
Progress Note    Todd Trujillo.  GUR:[id number] DOB: Jan 20, 1930  DOA: 07/17/2022 PCP: Einar Pheasant, MD      Brief Narrative:    Medical records reviewed and are as summarized below:  Todd Trujillo. is a 89 y.o. male male with medical history significant for recent discharge from the hospital on 06/11/2022 for aspiration pneumonia, hypoxia and acute colitis; dementia, hypertension, hyperlipidemia, chronic diastolic CHF, stroke, GERD, dementia, skin cancer, prostate cancer, kidney stone, aspiration pneumonia, colitis, who presented to the hospital with cough, shortness of breath and vomiting.  Her daughter said that patient had been ill and vomiting since Thanksgiving and they attributed this to aspiration pneumonia.  He tested positive for COVID-19 infection a day prior to admission.       Assessment/Plan:   Principal Problem:   Acute respiratory disease due to COVID-19 virus Active Problems:   Acute respiratory failure with hypoxia (HCC)   Aspiration pneumonia (HCC)   Severe sepsis (HCC)   Dementia without behavioral disturbance (HCC)   HTN (hypertension)   History of CVA (cerebrovascular accident)   Nausea & vomiting   Protein-calorie malnutrition, severe    Body mass index is 20.79 kg/m.   Severe sepsis secondary to COVID-19 pneumonia, recent aspiration pneumonia: He seems to be slowly improving.  Plan to complete 5-day course of Unasyn on 07/22/2022.  Continue Paxlovid.  Continue IV dexamethasone.   Acute hypoxic respiratory failure: Slowly improving.  He has been weaned off of heated humidified high flow nasal cannula.  He is on 3 L/min oxygen via nasal cannula.  Taper off oxygen as able.   Hypotension: Resolved.  IV fluids has been discontinued.  Poor oral intake, dysphagia: Speech therapist recommended dysphagia 2 diet.  However, nurse reports that he is not eating much.  Nausea and vomiting: Improved.  Antiemetics as  needed.  General weakness: Consult PT and OT.  Other comorbidities include dementia, history of stroke       Diet Order             DIET DYS 2 Room service appropriate? No; Fluid consistency: Thin  Diet effective now                            Consultants: None  Procedures: None    Medications:    aspirin EC  81 mg Oral Daily   cholecalciferol  1,000 Units Oral Daily   dexamethasone (DECADRON) injection  6 mg Intravenous Daily   enoxaparin (LOVENOX) injection  40 mg Subcutaneous Q24H   feeding supplement  237 mL Oral TID BM   multivitamin with minerals  1 tablet Oral Daily   nirmatrelvir/ritonavir  3 tablet Oral BID   QUEtiapine  25 mg Oral QHS   Continuous Infusions:  ampicillin-sulbactam (UNASYN) IV 3 g (07/20/22 1202)     Anti-infectives (From admission, onward)    Start     Dose/Rate Route Frequency Ordered Stop   07/17/22 1800  Ampicillin-Sulbactam (UNASYN) 3 g in sodium chloride 0.9 % 100 mL IVPB        3 g 200 mL/hr over 30 Minutes Intravenous Every 6 hours 07/17/22 1306 07/22/22 1759   07/17/22 1300  nirmatrelvir/ritonavir (PAXLOVID) 3 tablet        3 tablet Oral 2 times daily 07/17/22 1251 07/22/22 0759   07/17/22 1030  Ampicillin-Sulbactam (UNASYN) 3 g in sodium chloride 0.9 % 100 mL IVPB  3 g 200 mL/hr over 30 Minutes Intravenous  Once 07/17/22 1026 07/17/22 1215              Family Communication/Anticipated D/C date and plan/Code Status   DVT prophylaxis: enoxaparin (LOVENOX) injection 40 mg Start: 07/17/22 1300     Code Status: DNR  Family Communication: Wife and daughter Joslyn Devon) at the bedside Disposition Plan: May need SNF at discharge   Status is: Inpatient Remains inpatient appropriate because: Severe hypoxia       Subjective:   Interval events noted.  He is unable to provide any history.  His wife and daughter, Joslyn Devon, were at the bedside.  Objective:    Vitals:   07/20/22 0100 07/20/22  0414 07/20/22 0858 07/20/22 1119  BP:  (!) 142/70 (!) 145/61 127/64  Pulse:  70 89 76  Resp:  17 18   Temp: 97.7 F (36.5 C) 97.6 F (36.4 C) 98 F (36.7 C) 98.4 F (36.9 C)  TempSrc: Oral  Oral Oral  SpO2:  100% 100% 99%  Weight:      Height:       No data found.   Intake/Output Summary (Last 24 hours) at 07/20/2022 1310 Last data filed at 07/20/2022 0524 Gross per 24 hour  Intake 100 ml  Output 550 ml  Net -450 ml   Filed Weights   07/17/22 0949 07/18/22 2153 07/19/22 1527  Weight: 63.5 kg 66.8 kg 63.9 kg    Exam:   GEN: NAD SKIN: Warm and dry EYES: Anicteric ENT: MMM CV: RRR PULM: Bibasilar rales, coarse breath sounds at the lung bases ABD: soft, ND, NT, +BS CNS: AAO x 3, non focal EXT: No edema or tenderness    Data Reviewed:   I have personally reviewed following labs and imaging studies:  Labs: Labs show the following:   Basic Metabolic Panel: Recent Labs  Lab 07/17/22 1012 07/18/22 0610 07/19/22 0621 07/20/22 0710  NA 141 142 145 144  K 4.6 3.9 4.0 3.7  CL 105 110 110 111  CO2 '26 24 27 28  '$ GLUCOSE 135* 114* 142* 161*  BUN '14 16 19 '$ 26*  CREATININE 0.86 0.74 0.70 0.76  CALCIUM 9.3 8.3* 9.0 8.7*  MG  --   --  2.1  --   PHOS  --   --  2.9  --    GFR Estimated Creatinine Clearance: 55.5 mL/min (by C-G formula based on SCr of 0.76 mg/dL). Liver Function Tests: Recent Labs  Lab 07/17/22 1012 07/19/22 0621 07/20/22 0710  AST '22 18 18  '$ ALT '10 7 8  '$ ALKPHOS 95 71 62  BILITOT 0.8 0.8 0.7  PROT 7.8 6.3* 5.7*  ALBUMIN 3.5 2.6* 2.5*   No results for input(s): "LIPASE", "AMYLASE" in the last 168 hours. No results for input(s): "AMMONIA" in the last 168 hours. Coagulation profile Recent Labs  Lab 07/17/22 1012  INR 1.3*    CBC: Recent Labs  Lab 07/17/22 1146 07/18/22 0610 07/19/22 0621 07/20/22 0710  WBC 15.4* 13.4* 13.6* 18.2*  NEUTROABS 14.1*  --  11.7* 15.8*  HGB 11.4* 8.6* 9.6* 9.1*  HCT 37.1* 28.7* 32.2* 30.8*  MCV 90.7  92.6 93.6 92.5  PLT 407* 290 324 325   Cardiac Enzymes: No results for input(s): "CKTOTAL", "CKMB", "CKMBINDEX", "TROPONINI" in the last 168 hours. BNP (last 3 results) No results for input(s): "PROBNP" in the last 8760 hours. CBG: No results for input(s): "GLUCAP" in the last 168 hours. D-Dimer: Recent Labs    07/19/22  9629  DDIMER 1.26*   Hgb A1c: No results for input(s): "HGBA1C" in the last 72 hours. Lipid Profile: No results for input(s): "CHOL", "HDL", "LDLCALC", "TRIG", "CHOLHDL", "LDLDIRECT" in the last 72 hours. Thyroid function studies: No results for input(s): "TSH", "T4TOTAL", "T3FREE", "THYROIDAB" in the last 72 hours.  Invalid input(s): "FREET3" Anemia work up: No results for input(s): "VITAMINB12", "FOLATE", "FERRITIN", "TIBC", "IRON", "RETICCTPCT" in the last 72 hours. Sepsis Labs: Recent Labs  Lab 07/17/22 1146 07/17/22 1153 07/18/22 0610 07/19/22 0621 07/20/22 0710  PROCALCITON 0.13  --   --   --   --   WBC 15.4*  --  13.4* 13.6* 18.2*  LATICACIDVEN 2.1* 1.2  --   --   --     Microbiology Recent Results (from the past 240 hour(s))  Culture, blood (Routine x 2)     Status: None (Preliminary result)   Collection Time: 07/17/22 10:12 AM   Specimen: BLOOD LEFT ARM  Result Value Ref Range Status   Specimen Description BLOOD LEFT ARM  Final   Special Requests   Final    BOTTLES DRAWN AEROBIC AND ANAEROBIC Blood Culture adequate volume   Culture   Final    NO GROWTH 3 DAYS Performed at [facility], [address], [place], [place] [address]    Report Status PENDING  Incomplete  Resp panel by RT-PCR (RSV, Flu A&B, Covid) Anterior Nasal Swab     Status: Abnormal   Collection Time: 07/17/22 10:12 AM   Specimen: Anterior Nasal Swab  Result Value Ref Range Status   SARS Coronavirus 2 by RT PCR POSITIVE (A) NEGATIVE Final    Comment: (NOTE) SARS-CoV-2 target nucleic acids are DETECTED.  The SARS-CoV-2 RNA is generally detectable in upper  respiratory specimens during the acute phase of infection. Positive results are indicative of the presence of the identified virus, but do not rule out bacterial infection or co-infection with other pathogens not detected by the test. Clinical correlation with patient history and other diagnostic information is necessary to determine patient infection status. The expected result is Negative.  Fact Sheet for Patients: EntrepreneurPulse.com.au  Fact Sheet for Healthcare Providers: IncredibleEmployment.be  This test is not yet approved or cleared by the [place] FDA and  has been authorized for detection and/or diagnosis of SARS-CoV-2 by FDA under an Emergency Use Authorization (EUA).  This EUA will remain in effect (meaning this test can be used) for the duration of  the COVID-19 declaration under Section 564(b)(1) of the A ct, 21 U.S.C. section 360bbb-3(b)(1), unless the authorization is terminated or revoked sooner.     Influenza A by PCR NEGATIVE NEGATIVE Final   Influenza B by PCR NEGATIVE NEGATIVE Final    Comment: (NOTE) The Xpert Xpress SARS-CoV-2/FLU/RSV plus assay is intended as an aid in the diagnosis of influenza from Nasopharyngeal swab specimens and should not be used as a sole basis for treatment. Nasal washings and aspirates are unacceptable for Xpert Xpress SARS-CoV-2/FLU/RSV testing.  Fact Sheet for Patients: EntrepreneurPulse.com.au  Fact Sheet for Healthcare Providers: IncredibleEmployment.be  This test is not yet approved or cleared by the [place] FDA and has been authorized for detection and/or diagnosis of SARS-CoV-2 by FDA under an Emergency Use Authorization (EUA). This EUA will remain in effect (meaning this test can be used) for the duration of the COVID-19 declaration under Section 564(b)(1) of the Act, 21 U.S.C. section 360bbb-3(b)(1), unless the authorization is  terminated or revoked.     Resp Syncytial Virus  by PCR NEGATIVE NEGATIVE Final    Comment: (NOTE) Fact Sheet for Patients: EntrepreneurPulse.com.au  Fact Sheet for Healthcare Providers: IncredibleEmployment.be  This test is not yet approved or cleared by the [place] FDA and has been authorized for detection and/or diagnosis of SARS-CoV-2 by FDA under an Emergency Use Authorization (EUA). This EUA will remain in effect (meaning this test can be used) for the duration of the COVID-19 declaration under Section 564(b)(1) of the Act, 21 U.S.C. section 360bbb-3(b)(1), unless the authorization is terminated or revoked.  Performed at [facility], [address] [place], [place] [address]   Culture, blood (Routine x 2)     Status: None (Preliminary result)   Collection Time: 07/17/22 11:46 AM   Specimen: BLOOD  Result Value Ref Range Status   Specimen Description BLOOD LEFT ANTECUBITAL  Final   Special Requests   Final    BOTTLES DRAWN AEROBIC AND ANAEROBIC Blood Culture adequate volume   Culture   Final    NO GROWTH 3 DAYS Performed at [facility], [address] [place], [place] [address]    Report Status PENDING  Incomplete    Procedures and diagnostic studies:  No results found.             LOS: 3 days   Elvia Aydin  [facility]   Pager on [email or web address]. If 7PM-7AM, please contact night-coverage at [email or web address]     07/20/2022, 1:10 PM

## 2022-07-20 NOTE — Telephone Encounter (Signed)
Pt has been admitted due to covid and pneumonia. Daughter has requested to cancel appts and will call back to reschedule.

## 2022-07-20 NOTE — Evaluation (Signed)
Clinical/Bedside Swallow Evaluation Patient Details  Name: Todd Trujillo. MRN: [id number] Date of Birth: 1930/05/22  Today's Date: 07/20/2022 Time: SLP Start Time (ACUTE ONLY): 1450 SLP Stop Time (ACUTE ONLY): 1545 SLP Time Calculation (min) (ACUTE ONLY): 55 min  Past Medical History:  Past Medical History:  Diagnosis Date   Arthritis    Cancer ([place])    prostate   Hyperlipidemia    Hypertension    Left wrist fracture    Macular degeneration of right eye    Nephrolithiasis    Prostate CA ([place]) 2003   Prostate troubles    Patient was unsure of term   Squamous cell carcinoma of skin 01/23/2017   R distal lat bicep near elbow   Squamous cell carcinoma of skin 03/05/2022   L forearm posterior - tx with ED&C   Past Surgical History:  Past Surgical History:  Procedure Laterality Date   INTRAMEDULLARY (IM) NAIL INTERTROCHANTERIC Right 06/07/2021   Procedure: INTRAMEDULLARY (IM) NAIL INTERTROCHANTRIC;  Surgeon: Thornton Park, MD;  Location: [facility] ORS;  Service: Orthopedics;  Laterality: Right;   TOTAL KNEE ARTHROPLASTY Right    HPI:  Pt is a 89 y.o. male with medical history significant of Dementia, oropharyngeal phase Dysphagia w/ aspiration per MBSS 07/2021, hypertension, hyperlipidemia, diastolic CHF, stroke, GERD, dementia, skin cancer, prostate cancer, kidney stone, aspiration pneumonia, colitis, who presents with shortness breath.     Per daughter at the bedside, patient had 2 episode of vomiting, first episode of vomiting was 5 days ago, then vomited again last night.  Patient does not have abdominal pain, but has loose stool.  He developed cough, shortness of breath, subjective fever, chills in the past several days.  He was tested positive for COVID day b/f admission to the ED. Pt does not have productive cough, but he seems to have mucus stuck in his throat.  He has gurgling sound.  No chest pain.  No symptoms of UTI.  Mental status is at baseline per her daughter.      Patient is normally not using oxygen, but was found to have severe respiratory distress, using accessory muscle for breathing.  High flow nasal cannula oxygen was started in ED; now on [place] O2 at 5L.  WBC elevated at admit, curently.    Assessment / Plan / Recommendation  Clinical Impression  Pt presents w/ oropharyngeal phase Dysphagia and HIGH risk for aspiration/aspiration pneumonia. Pt appears to have declined in functional swallowing since last assessed (see MBSS 07/2021 also). Oral intake does not appear safe nor can it be recommended. Daughter present in room agreed w/ pt's presentation and explanation of BSE results and recommenation of NPO status -- Family states they "want pt to continue eating and drinking as he wants to". The above was discussed w/ MD and Team members including Palliative Care. Goals set by Family at this time. No furhter skilled ST services indicated. SLP Visit Diagnosis: Dysphagia, oropharyngeal phase (R13.12) (PROFOUND)    Aspiration Risk  Severe aspiration risk;Risk for inadequate nutrition/hydration    Diet Recommendation   NPO w/ frequent oral care for hygiene and comfort; stimulation of swallowing  Medication Administration: Via alternative means    Other  Recommendations Recommended Consults:  (Palliative Care) Oral Care Recommendations: Oral care QID;Staff/trained caregiver to provide oral care    Recommendations for follow up therapy are one component of a multi-disciplinary discharge planning process, led by the attending physician.  Recommendations may be updated based on patient status, additional functional criteria and insurance  authorization.  Follow up Recommendations Follow physician's recommendations for discharge plan and follow up therapies (post Discharge to next venue of care)      Assistance Recommended at Discharge  full  Functional Status Assessment Patient has had a recent decline in their functional status and/or demonstrates limited  ability to make significant improvements in function in a reasonable and predictable amount of time  Frequency and Duration  (n/a)   (n/a)       Prognosis Prognosis for Safe Diet Advancement: Guarded Barriers to Reach Goals: Cognitive deficits;Language deficits;Time post onset;Severity of deficits;Behavior Barriers/Prognosis Comment: Dementia; Profound oropharyngeal phase dysphagia      Swallow Study   General Date of Onset: 07/17/22 HPI: Pt is a 89 y.o. male with medical history significant of Dementia, oropharyngeal phase Dysphagia w/ aspiration per MBSS 07/2021, hypertension, hyperlipidemia, diastolic CHF, stroke, GERD, dementia, skin cancer, prostate cancer, kidney stone, aspiration pneumonia, colitis, who presents with shortness breath.     Per daughter at the bedside, patient had 2 episode of vomiting, first episode of vomiting was 5 days ago, then vomited again last night.  Patient does not have abdominal pain, but has loose stool.  He developed cough, shortness of breath, subjective fever, chills in the past several days.  He was tested positive for COVID day b/f admission to the ED. Pt does not have productive cough, but he seems to have mucus stuck in his throat.  He has gurgling sound.  No chest pain.  No symptoms of UTI.  Mental status is at baseline per her daughter.     Patient is normally not using oxygen, but was found to have severe respiratory distress, using accessory muscle for breathing.  High flow nasal cannula oxygen was started in ED; now on [place] O2 at 5L.  WBC elevated at admit, curently. Type of Study: Bedside Swallow Evaluation Previous Swallow Assessment: MBSS 07/2021 -- see results indicating oropharyngeal phase Dysphagia w/ Aspiration. Diet Prior to this Study: Dysphagia 2 (chopped);Thin liquids (per MD order at admit) Temperature Spikes Noted: No (wbc 18.2) Respiratory Status: Nasal cannula (5L) History of Recent Intubation: No Behavior/Cognition:  Alert;Cooperative;Pleasant mood;Confused;Distractible;Requires cueing Oral Cavity Assessment: Dry Oral Care Completed by SLP: Yes (attempted but pt bit on swabs) Oral Cavity - Dentition: Missing dentition (MOST -- few front lower teeth only) Vision:  (n/a) Self-Feeding Abilities: Total assist Patient Positioning: Upright in bed (needed full positioning) Baseline Vocal Quality:  (nonverbal) Volitional Cough: Cognitively unable to elicit (nonvolitional cough was WEAK, congested) Volitional Swallow: Unable to elicit    Oral/Motor/Sensory Function Overall Oral Motor/Sensory Function: Generalized oral weakness (tonic bite; lingual retraction at rest - baseline Dementia)   Ice Chips Ice chips: Not tested   Thin Liquid Thin Liquid: Impaired Presentation: Straw (fed w/ straw (baseline); 3 trials attempted = pt sipped from straw 1/3 trials) Oral Phase Impairments: Reduced lingual movement/coordination;Reduced labial seal;Poor awareness of bolus Oral Phase Functional Implications:  (suspected quick posterior spillage) Pharyngeal  Phase Impairments: Cough - Delayed (congested) Other Comments: no other trials attempted d/t POOR awareness and risk    Nectar Thick Nectar Thick Liquid: Not tested   Honey Thick Honey Thick Liquid: Not tested   Puree Puree: Impaired Presentation: Spoon (fed; 1 trial) Oral Phase Impairments: Reduced labial seal;Reduced lingual movement/coordination;Poor awareness of bolus Oral Phase Functional Implications: Oral residue (had to remove the bolus d/t lack of attention to it) Other Comments: swabs to remove bolus   Solid     Solid: Not tested  [name], MS, CCC-SLP Speech Language Pathologist Rehab Services; [place] [phone] (ascom) Vijay Durflinger 07/20/2022,5:51 PM

## 2022-07-20 NOTE — Progress Notes (Addendum)
Palliative: Thank you for this consult.  Unfortunately, due to patient's census and staffing, there will be a delay in PMT consultation.  Palliative provider is expected to return 07/23/2022 and will see patient at that time if still hospitalized.  During recent hospital stay Todd Trujillo's family accepted outpatient palliative services.  When contacted by [facility] palliative services, daughters stated that they would take hospice care once Todd Trujillo completed home health services.  From 06/15/2022 note from [facility] palliative services.  ""Daughter declined PC services at this time. Per daughter their plan is once patient completes [place] he will transition to hospice. Daughter states she will outreach PC should this change.""   Conference with attending, bedside nursing staff, transition of care team, speech therapy, inpatient [facility] hospice representative related to patient condition, needs, goals of care.  Addendum: After secure chat discussion with team, goals are set.  PMT inpatient to sign off.  No charge Quinn Axe, NP Palliative medicine team Team phone [phone] Greater than 50% of this time was spent counseling and coordinating care related to the above assessment and plan.

## 2022-07-20 NOTE — Care Management Important Message (Signed)
Important Message  Patient Details  Name: Todd Trujillo. MRN: [id number] Date of Birth: 1930-06-19   Medicare Important Message Given:  N/A - LOS <3 / Initial given by admissions     [name] 07/20/2022, 9:07 AM

## 2022-07-20 NOTE — Evaluation (Signed)
Physical Therapy Evaluation Patient Details Name: Todd Trujillo. MRN: [id number] DOB: Jan 24, 1930 Today's Date: 07/20/2022  History of Present Illness  Todd Trujillo. is a 89 y.o. male with past medical history of hypertension, stroke, vascular dementia, and recurrent aspiration who presents to the ED complaining of shortness of breath.  Patient's wife at bedside states that he initially vomited 5 days ago, has appeared to gradually worsen since then.  He has had a cough where it sounds like something has been stuck in his throat, had another small episode of vomiting earlier today.  Wife does state that he was exposed to his daughter with COVID-19 and subsequently tested positive for COVID yesterday.  She has noticed that his breathing has steadily worsened over the past 24 hours and so decided to seek care in the ED.  Patient unable to provide any history.   Clinical Impression  Pt received upright in bed with daughter then wife in room and OT present. Family acceptable to PT/OT co-eval. Pt with baseline dementia so use of family for PLOF. Per family pt at baseline needs moderate assist for transfers to RW and ambulates very short distances in home. Does rely on family support and PCA's to assist in gait/transfers/ADL's/IADL's.   To date pt on 5 L/min  difficulty following cues and hand over hand directions. Notable BM in supine thus requiring maxA+2 to roll and dependent for perihygiene. Pt then reliant on maxA+2 and bed features to transfer sitting EOB with pt being resistive at LE's and Ue's. Once sitting significant R lateral lean and posterior lean relying on frequent modA from OT to correct sitting EOB with hand over hand placement with Ue's/LE's leading to fair carryvoer in sitting but needing CGA throughout. X2 STS performed at modA+2 level with significant posterior lean with pt unable to anteriorly shift weight over his LE's despite max multimodal cuing and reliant on  R lateral pelvic shift to sit higher in bed. Pt maxA+2 to return to bed with all needs in reach. Pt has excellent family support and all needed DME and has had success with family and [place] services. Will plan to continue [facility] services as family wishes pt to return home. Pt currently with functional limitations due to the deficits listed below (see PT Problem List). Pt will benefit from skilled PT to increase their independence and safety with mobility to allow discharge to the venue listed below.      Recommendations for follow up therapy are one component of a multi-disciplinary discharge planning process, led by the attending physician.  Recommendations may be updated based on patient status, additional functional criteria and insurance authorization.  Follow Up Recommendations Home health PT      Assistance Recommended at Discharge Frequent or constant Supervision/Assistance  Patient can return home with the following  Two people to help with bathing/dressing/bathroom;Two people to help with walking and/or transfers;Direct supervision/assist for medications management;Assist for transportation;Assistance with feeding;Assistance with cooking/housework;Help with stairs or ramp for entrance    Equipment Recommendations None recommended by PT  Recommendations for Other Services       Functional Status Assessment Patient has had a recent decline in their functional status and demonstrates the ability to make significant improvements in function in a reasonable and predictable amount of time.     Precautions / Restrictions Precautions Precautions: Fall Restrictions Weight Bearing Restrictions: No      Mobility  Bed Mobility Overal bed mobility: Needs Assistance Bed Mobility: Supine to Sit, Sit to  Supine     Supine to sit: Max assist, +2 for physical assistance, HOB elevated Sit to supine: Max assist, +2 for physical assistance   General bed mobility comments: multimodal cuing and hand  over hand cues. Patient Response: Cooperative  Transfers Overall transfer level: Needs assistance Equipment used: Rolling walker (2 wheels) Transfers: Sit to/from Stand Sit to Stand: Mod assist, +2 physical assistance           General transfer comment: Posterior bias in standing.    Ambulation/Gait               General Gait Details: Deferred due to difficulty standing.  Stairs            Wheelchair Mobility    Modified Rankin (Stroke Patients Only)       Balance Overall balance assessment: Needs assistance Sitting-balance support: Bilateral upper extremity supported, Feet supported Sitting balance-Leahy Scale: Poor Sitting balance - Comments: needing modA frequently to maintain sitting EOB. Postural control: Right lateral lean, Posterior lean   Standing balance-Leahy Scale: Poor Standing balance comment: Posterior bias, unable to shift trunk over LE's                             Pertinent Vitals/Pain Pain Assessment Pain Assessment: Faces Faces Pain Scale: No hurt    Home Living Family/patient expects to be discharged to:: Private residence Living Arrangements: Spouse/significant other Available Help at Discharge: Family;Personal care attendant;Available 24 hours/day Type of Home: House Home Access: Stairs to enter Entrance Stairs-Rails: Psychiatric nurse of Steps: 3   Home Layout: Two level;Able to live on main level with bedroom/bathroom Home Equipment: Rolling Walker (2 wheels);Transport chair;Hospital bed;BSC/3in1 (STS lift)      Prior Function Prior Level of Function : Needs assist             Mobility Comments: Transfers modA and ambulating ~10' in his home. ADLs Comments: Requires assist from family and PCA for ADL's/IADL's.     Hand Dominance        Extremity/Trunk Assessment   Upper Extremity Assessment Upper Extremity Assessment: Generalized weakness    Lower Extremity Assessment Lower  Extremity Assessment: Generalized weakness       Communication   Communication: HOH  Cognition Arousal/Alertness: Awake/alert Behavior During Therapy: Flat affect Overall Cognitive Status: History of cognitive impairments - at baseline                                 General Comments: baseline dementia        General Comments      Exercises Other Exercises Other Exercises: Role of PT in acute setting, d/c recs   Assessment/Plan    PT Assessment Patient needs continued PT services  PT Problem List Decreased strength;Decreased cognition;Decreased activity tolerance;Decreased balance       PT Treatment Interventions DME instruction;Therapeutic exercise;Gait training;Balance training;Neuromuscular re-education;Stair training;Functional mobility training;Therapeutic activities;Patient/family education    PT Goals (Current goals can be found in the Care Plan section)  Acute Rehab PT Goals Patient Stated Goal: return home with [facility] and family support PT Goal Formulation: With family Time For Goal Achievement: 08/03/22 Potential to Achieve Goals: Good    Frequency Min 2X/week     Co-evaluation PT/OT/SLP Co-Evaluation/Treatment: Yes Reason for Co-Treatment: Complexity of the patient's impairments (multi-system involvement);Necessary to address cognition/behavior during functional activity PT goals addressed during session: Mobility/safety with mobility;Balance;Proper  use of DME OT goals addressed during session: ADL's and self-care;Proper use of Adaptive equipment and DME       AM-PAC PT "6 Clicks" Mobility  Outcome Measure Help needed turning from your back to your side while in a flat bed without using bedrails?: A Lot Help needed moving from lying on your back to sitting on the side of a flat bed without using bedrails?: A Lot Help needed moving to and from a bed to a chair (including a wheelchair)?: Total Help needed standing up from a chair using your  arms (e.g., wheelchair or bedside chair)?: A Lot Help needed to walk in hospital room?: Total Help needed climbing 3-5 steps with a railing? : Total 6 Click Score: 9    End of Session Equipment Utilized During Treatment: Gait belt;Oxygen Activity Tolerance: Patient tolerated treatment well Patient left: in bed;with call bell/phone within reach;with bed alarm set;with family/visitor present Nurse Communication: Mobility status PT Visit Diagnosis: Unsteadiness on feet (R26.81);Other abnormalities of gait and mobility (R26.89);Muscle weakness (generalized) (M62.81)    Time: 0037-0488 PT Time Calculation (min) (ACUTE ONLY): 23 min   Charges:   PT Evaluation $PT Eval Moderate Complexity: [place] M. Fairly IV, PT, DPT Physical Therapist- [facility]  07/20/2022, 4:02 PM

## 2022-07-21 DIAGNOSIS — F0390 Unspecified dementia without behavioral disturbance: Secondary | ICD-10-CM | POA: Diagnosis not present

## 2022-07-21 DIAGNOSIS — U071 COVID-19: Secondary | ICD-10-CM | POA: Diagnosis not present

## 2022-07-21 DIAGNOSIS — J9601 Acute respiratory failure with hypoxia: Secondary | ICD-10-CM | POA: Diagnosis not present

## 2022-07-21 DIAGNOSIS — J069 Acute upper respiratory infection, unspecified: Secondary | ICD-10-CM | POA: Diagnosis not present

## 2022-07-21 LAB — CBC WITH DIFFERENTIAL/PLATELET
Abs Immature Granulocytes: 0.25 10*3/uL — ABNORMAL HIGH (ref 0.00–0.07)
Basophils Absolute: 0 10*3/uL (ref 0.0–0.1)
Basophils Relative: 0 %
Eosinophils Absolute: 0 10*3/uL (ref 0.0–0.5)
Eosinophils Relative: 0 %
HCT: 28.5 % — ABNORMAL LOW (ref 39.0–52.0)
Hemoglobin: 8.7 g/dL — ABNORMAL LOW (ref 13.0–17.0)
Immature Granulocytes: 2 %
Lymphocytes Relative: 8 %
Lymphs Abs: 1.4 10*3/uL (ref 0.7–4.0)
MCH: 27.7 pg (ref 26.0–34.0)
MCHC: 30.5 g/dL (ref 30.0–36.0)
MCV: 90.8 fL (ref 80.0–100.0)
Monocytes Absolute: 0.5 10*3/uL (ref 0.1–1.0)
Monocytes Relative: 3 %
Neutro Abs: 14.8 10*3/uL — ABNORMAL HIGH (ref 1.7–7.7)
Neutrophils Relative %: 87 %
Platelets: 326 10*3/uL (ref 150–400)
RBC: 3.14 MIL/uL — ABNORMAL LOW (ref 4.22–5.81)
RDW: 15.1 % (ref 11.5–15.5)
WBC: 17 10*3/uL — ABNORMAL HIGH (ref 4.0–10.5)
nRBC: 0 % (ref 0.0–0.2)

## 2022-07-21 LAB — MAGNESIUM: Magnesium: 2.1 mg/dL (ref 1.7–2.4)

## 2022-07-21 LAB — BASIC METABOLIC PANEL
Anion gap: 9 (ref 5–15)
BUN: 25 mg/dL — ABNORMAL HIGH (ref 8–23)
CO2: 26 mmol/L (ref 22–32)
Calcium: 8.7 mg/dL — ABNORMAL LOW (ref 8.9–10.3)
Chloride: 109 mmol/L (ref 98–111)
Creatinine, Ser: 0.71 mg/dL (ref 0.61–1.24)
GFR, Estimated: >60 mL/min (ref >60)
Glucose, Bld: 131 mg/dL — ABNORMAL HIGH (ref 70–99)
Potassium: 3.9 mmol/L (ref 3.5–5.1)
Sodium: 144 mmol/L (ref 135–145)

## 2022-07-21 LAB — PHOSPHORUS: Phosphorus: 2.5 mg/dL (ref 2.5–4.6)

## 2022-07-21 NOTE — Progress Notes (Signed)
Progress Note    Todd Trujillo.  BZJ:[id number] DOB: [birth year]  DOA: 07/17/2022 PCP: Einar Pheasant, MD      Brief Narrative:    Medical records reviewed and are as summarized below:  Todd Trujillo. is a 89 y.o. male male with medical history significant for recent discharge from the hospital on 06/11/2022 for aspiration pneumonia, hypoxia and acute colitis; dementia, hypertension, hyperlipidemia, chronic diastolic CHF, stroke, GERD, dementia, skin cancer, prostate cancer, kidney stone, aspiration pneumonia, colitis, who presented to the hospital with cough, shortness of breath and vomiting.  Her daughter said that patient had been ill and vomiting since Thanksgiving and they attributed this to aspiration pneumonia.  He tested positive for COVID-19 infection a day prior to admission.       Assessment/Plan:   Principal Problem:   Acute respiratory disease due to COVID-19 virus Active Problems:   Acute respiratory failure with hypoxia (HCC)   Aspiration pneumonia (HCC)   Severe sepsis (HCC)   Dementia without behavioral disturbance (HCC)   HTN (hypertension)   History of CVA (cerebrovascular accident)   Nausea & vomiting   Protein-calorie malnutrition, severe    Body mass index is 20.79 kg/m.   Severe sepsis secondary to COVID-19 pneumonia, recent aspiration pneumonia: Slowly improving.  Continue Unasyn through 07/22/2022.  Continue Paxlovid as able.  Daughter said that sometimes patient spits out the Paxlovid.  Continue IV dexamethasone. Leukocytosis likely due to steroids.   Acute hypoxic respiratory failure: Improving.  He is on 3 L/min oxygen via nasal cannula.  Wean off oxygen as able.  Of note, he was previously on oxygen via heated humidified HFNC.  Hypotension: Resolved.   Poor oral intake, dysphagia: Speech therapy is recommended NPO because of concern for aspiration.  However, his family insists that they want him to have a diet  despite the risk of aspiration.  Amy said that family is aware of the risk of aspiration but patient has always done well with swallowing at home.  Nausea and vomiting: Improved.  Antiemetics as needed.  General weakness: PT and OT recommended home health therapy  Other comorbidities include dementia, history of stroke       Diet Order             DIET DYS 2 Room service appropriate? No; Fluid consistency: Thin  Diet effective now                            Consultants: None  Procedures: None    Medications:    aspirin EC  81 mg Oral Daily   cholecalciferol  1,000 Units Oral Daily   dexamethasone (DECADRON) injection  6 mg Intravenous Daily   enoxaparin (LOVENOX) injection  40 mg Subcutaneous Q24H   feeding supplement  237 mL Oral TID BM   multivitamin with minerals  1 tablet Oral Daily   nirmatrelvir/ritonavir  3 tablet Oral BID   QUEtiapine  25 mg Oral QHS   Continuous Infusions:  ampicillin-sulbactam (UNASYN) IV 3 g (07/21/22 0526)     Anti-infectives (From admission, onward)    Start     Dose/Rate Route Frequency Ordered Stop   07/17/22 1800  Ampicillin-Sulbactam (UNASYN) 3 g in sodium chloride 0.9 % 100 mL IVPB        3 g 200 mL/hr over 30 Minutes Intravenous Every 6 hours 07/17/22 1306 07/22/22 1759   07/17/22 1300  nirmatrelvir/ritonavir (PAXLOVID) 3 tablet  3 tablet Oral 2 times daily 07/17/22 1251 07/22/22 0759   07/17/22 1030  Ampicillin-Sulbactam (UNASYN) 3 g in sodium chloride 0.9 % 100 mL IVPB        3 g 200 mL/hr over 30 Minutes Intravenous  Once 07/17/22 1026 07/17/22 1215              Family Communication/Anticipated D/C date and plan/Code Status   DVT prophylaxis: enoxaparin (LOVENOX) injection 40 mg Start: 07/17/22 1300     Code Status: DNR  Family Communication: Amy, daughter, at the bedside Disposition Plan: Plan to discharge home with 1 to 2 days   Status is: Inpatient Remains inpatient appropriate  because: COVID-19 infection, debility       Subjective:   Interval events noted.  He is unable to provide any history.  Amy, daughter, was at the bedside.  Objective:    Vitals:   07/21/22 0524 07/21/22 0818 07/21/22 0907 07/21/22 1156  BP: 130/78 135/68  115/64  Pulse: 71 75  71  Resp: 18 18  (!) 21  Temp: (!) 97.5 F (36.4 C) 97.7 F (36.5 C)    TempSrc: Oral Oral    SpO2: 99% 100% 99% 99%  Weight:      Height:       No data found.   Intake/Output Summary (Last 24 hours) at 07/21/2022 1208 Last data filed at 07/21/2022 0700 Gross per 24 hour  Intake --  Output 1150 ml  Net -1150 ml   Filed Weights   07/17/22 0949 07/18/22 2153 07/19/22 1527  Weight: 63.5 kg 66.8 kg 63.9 kg    Exam:   GEN: NAD SKIN: Warm and dry EYES: No pallor or icterus ENT: MMM CV: RRR PULM: Coarse breath sounds ABD: soft, ND, NT, +BS CNS: Alert.  He opens his mouth and attempts to speak but speech is very soft and inaudible. EXT: No edema or tenderness     Data Reviewed:   I have personally reviewed following labs and imaging studies:  Labs: Labs show the following:   Basic Metabolic Panel: Recent Labs  Lab 07/17/22 1012 07/18/22 0610 07/19/22 0621 07/20/22 0710 07/21/22 0608  NA 141 142 145 144 144  K 4.6 3.9 4.0 3.7 3.9  CL 105 110 110 111 109  CO2 '26 24 27 28 26  '$ GLUCOSE 135* 114* 142* 161* 131*  BUN '14 16 19 '$ 26* 25*  CREATININE 0.86 0.74 0.70 0.76 0.71  CALCIUM 9.3 8.3* 9.0 8.7* 8.7*  MG  --   --  2.1  --  2.1  PHOS  --   --  2.9  --  2.5   GFR Estimated Creatinine Clearance: 55.5 mL/min (by C-G formula based on SCr of 0.71 mg/dL). Liver Function Tests: Recent Labs  Lab 07/17/22 1012 07/19/22 0621 07/20/22 0710  AST '22 18 18  '$ ALT '10 7 8  '$ ALKPHOS 95 71 62  BILITOT 0.8 0.8 0.7  PROT 7.8 6.3* 5.7*  ALBUMIN 3.5 2.6* 2.5*   No results for input(s): "LIPASE", "AMYLASE" in the last 168 hours. No results for input(s): "AMMONIA" in the last 168  hours. Coagulation profile Recent Labs  Lab 07/17/22 1012  INR 1.3*    CBC: Recent Labs  Lab 07/17/22 1146 07/18/22 0610 07/19/22 0621 07/20/22 0710 07/21/22 0608  WBC 15.4* 13.4* 13.6* 18.2* 17.0*  NEUTROABS 14.1*  --  11.7* 15.8* 14.8*  HGB 11.4* 8.6* 9.6* 9.1* 8.7*  HCT 37.1* 28.7* 32.2* 30.8* 28.5*  MCV 90.7 92.6 93.6 92.5  90.8  PLT 407* 290 324 325 326   Cardiac Enzymes: No results for input(s): "CKTOTAL", "CKMB", "CKMBINDEX", "TROPONINI" in the last 168 hours. BNP (last 3 results) No results for input(s): "PROBNP" in the last 8760 hours. CBG: No results for input(s): "GLUCAP" in the last 168 hours. D-Dimer: Recent Labs    07/19/22 0621  DDIMER 1.26*   Hgb A1c: No results for input(s): "HGBA1C" in the last 72 hours. Lipid Profile: No results for input(s): "CHOL", "HDL", "LDLCALC", "TRIG", "CHOLHDL", "LDLDIRECT" in the last 72 hours. Thyroid function studies: No results for input(s): "TSH", "T4TOTAL", "T3FREE", "THYROIDAB" in the last 72 hours.  Invalid input(s): "FREET3" Anemia work up: No results for input(s): "VITAMINB12", "FOLATE", "FERRITIN", "TIBC", "IRON", "RETICCTPCT" in the last 72 hours. Sepsis Labs: Recent Labs  Lab 07/17/22 1146 07/17/22 1153 07/18/22 0610 07/19/22 0621 07/20/22 0710 07/21/22 1478  PROCALCITON 0.13  --   --   --   --   --   WBC 15.4*  --  13.4* 13.6* 18.2* 17.0*  LATICACIDVEN 2.1* 1.2  --   --   --   --     Microbiology Recent Results (from the past 240 hour(s))  Culture, blood (Routine x 2)     Status: None (Preliminary result)   Collection Time: 07/17/22 10:12 AM   Specimen: BLOOD LEFT ARM  Result Value Ref Range Status   Specimen Description BLOOD LEFT ARM  Final   Special Requests   Final    BOTTLES DRAWN AEROBIC AND ANAEROBIC Blood Culture adequate volume   Culture   Final    NO GROWTH 4 DAYS Performed at [facility], [address] [place], [place] [address]    Report Status PENDING  Incomplete   Resp panel by RT-PCR (RSV, Flu A&B, Covid) Anterior Nasal Swab     Status: Abnormal   Collection Time: 07/17/22 10:12 AM   Specimen: Anterior Nasal Swab  Result Value Ref Range Status   SARS Coronavirus 2 by RT PCR POSITIVE (A) NEGATIVE Final    Comment: (NOTE) SARS-CoV-2 target nucleic acids are DETECTED.  The SARS-CoV-2 RNA is generally detectable in upper respiratory specimens during the acute phase of infection. Positive results are indicative of the presence of the identified virus, but do not rule out bacterial infection or co-infection with other pathogens not detected by the test. Clinical correlation with patient history and other diagnostic information is necessary to determine patient infection status. The expected result is Negative.  Fact Sheet for Patients: EntrepreneurPulse.com.au  Fact Sheet for Healthcare Providers: IncredibleEmployment.be  This test is not yet approved or cleared by the [place] FDA and  has been authorized for detection and/or diagnosis of SARS-CoV-2 by FDA under an Emergency Use Authorization (EUA).  This EUA will remain in effect (meaning this test can be used) for the duration of  the COVID-19 declaration under Section 564(b)(1) of the A ct, 21 U.S.C. section 360bbb-3(b)(1), unless the authorization is terminated or revoked sooner.     Influenza A by PCR NEGATIVE NEGATIVE Final   Influenza B by PCR NEGATIVE NEGATIVE Final    Comment: (NOTE) The Xpert Xpress SARS-CoV-2/FLU/RSV plus assay is intended as an aid in the diagnosis of influenza from Nasopharyngeal swab specimens and should not be used as a sole basis for treatment. Nasal washings and aspirates are unacceptable for Xpert Xpress SARS-CoV-2/FLU/RSV testing.  Fact Sheet for Patients: EntrepreneurPulse.com.au  Fact Sheet for Healthcare Providers: IncredibleEmployment.be  This test is not yet approved  or cleared  by the [place] and has been authorized for detection and/or diagnosis of SARS-CoV-2 by FDA under an Emergency Use Authorization (EUA). This EUA will remain in effect (meaning this test can be used) for the duration of the COVID-19 declaration under Section 564(b)(1) of the Act, 21 U.S.C. section 360bbb-3(b)(1), unless the authorization is terminated or revoked.     Resp Syncytial Virus by PCR NEGATIVE NEGATIVE Final    Comment: (NOTE) Fact Sheet for Patients: EntrepreneurPulse.com.au  Fact Sheet for Healthcare Providers: IncredibleEmployment.be  This test is not yet approved or cleared by the [place] FDA and has been authorized for detection and/or diagnosis of SARS-CoV-2 by FDA under an Emergency Use Authorization (EUA). This EUA will remain in effect (meaning this test can be used) for the duration of the COVID-19 declaration under Section 564(b)(1) of the Act, 21 U.S.C. section 360bbb-3(b)(1), unless the authorization is terminated or revoked.  Performed at [facility], [address] [place], [place] [address]   Culture, blood (Routine x 2)     Status: None (Preliminary result)   Collection Time: 07/17/22 11:46 AM   Specimen: BLOOD  Result Value Ref Range Status   Specimen Description BLOOD LEFT ANTECUBITAL  Final   Special Requests   Final    BOTTLES DRAWN AEROBIC AND ANAEROBIC Blood Culture adequate volume   Culture   Final    NO GROWTH 4 DAYS Performed at [facility], [address] [place], [place]    Report Status PENDING  Incomplete    Procedures and diagnostic studies:  No results found.             LOS: 4 days   Cyndia Degraff  [facility]   Pager on [email or web address]. If 7PM-7AM, please contact night-coverage at [email or web address]     07/21/2022, 12:08 PM

## 2022-07-21 NOTE — TOC Progression Note (Signed)
Transition of Care [facility]) - Progression Note    Patient Details  Name: Todd Trujillo. MRN: [id number] Date of Birth: Apr 06, 1930  Transition of Care [facility]) CM/SW Contact  Izola Price, RN Phone Number: 07/21/2022, 12:54 PM  Clinical Narrative:  07/21/21: Hospice consulted on 07/20/21, but family declining at this point. Palliative following. Covid + day before admission but has been ill with vomiting and aspiration pneumonia per family per provider notes. Was discharged from acute care on 06/11/22 for aspiration pneumonia. Patient has significant PMH other co-morbidities. SLP, PT and OT following. SLP indicated HIGH risk for aspiration pneumonia and that oral intake is not safe. Family wishes patient to be able to drink and eats as he wants per SLP note. Notes also indicate that family wishes to progress to hospice after [facility] is completed. TOC will continue to monitor. Simmie Davies RN CM     Expected Discharge Plan:  (TBD) Barriers to Discharge: Continued Medical Work up  Expected Discharge Plan and Services                                               Social Determinants of Health (SDOH) Interventions SDOH Screenings   Food Insecurity: No Food Insecurity (07/19/2022)  Housing: Low Risk  (06/08/2022)  Transportation Needs: No Transportation Needs (07/19/2022)  Recent Concern: Transportation Needs - Unmet Transportation Needs (06/08/2022)  Utilities: Not At Risk (07/19/2022)  Depression (PHQ2-9): Low Risk  (06/21/2022)  Tobacco Use: Low Risk  (07/17/2022)    Readmission Risk Interventions     No data to display

## 2022-07-21 NOTE — Plan of Care (Signed)
  Problem: Clinical Measurements: Goal: Ability to maintain a body temperature in the normal range will improve Outcome: Progressing   Problem: Education: Goal: Knowledge of General Education information will improve Description: Including pain rating scale, medication(s)/side effects and non-pharmacologic comfort measures Outcome: Progressing   Problem: Health Behavior/Discharge Planning: Goal: Ability to manage health-related needs will improve Outcome: Progressing   Problem: Pain Managment: Goal: General experience of comfort will improve Outcome: Progressing

## 2022-07-22 DIAGNOSIS — K922 Gastrointestinal hemorrhage, unspecified: Secondary | ICD-10-CM | POA: Diagnosis not present

## 2022-07-22 DIAGNOSIS — D62 Acute posthemorrhagic anemia: Secondary | ICD-10-CM | POA: Diagnosis not present

## 2022-07-22 DIAGNOSIS — U071 COVID-19: Secondary | ICD-10-CM | POA: Diagnosis not present

## 2022-07-22 DIAGNOSIS — J9601 Acute respiratory failure with hypoxia: Secondary | ICD-10-CM | POA: Diagnosis not present

## 2022-07-22 LAB — CBC WITH DIFFERENTIAL/PLATELET
Abs Immature Granulocytes: 0.51 10*3/uL — ABNORMAL HIGH (ref 0.00–0.07)
Basophils Absolute: 0 10*3/uL (ref 0.0–0.1)
Basophils Relative: 0 %
Eosinophils Absolute: 0 10*3/uL (ref 0.0–0.5)
Eosinophils Relative: 0 %
HCT: 19.4 % — ABNORMAL LOW (ref 39.0–52.0)
Hemoglobin: 5.8 g/dL — ABNORMAL LOW (ref 13.0–17.0)
Immature Granulocytes: 3 %
Lymphocytes Relative: 7 %
Lymphs Abs: 1.3 10*3/uL (ref 0.7–4.0)
MCH: 27.5 pg (ref 26.0–34.0)
MCHC: 29.9 g/dL — ABNORMAL LOW (ref 30.0–36.0)
MCV: 91.9 fL (ref 80.0–100.0)
Monocytes Absolute: 1.3 10*3/uL — ABNORMAL HIGH (ref 0.1–1.0)
Monocytes Relative: 7 %
Neutro Abs: 16.7 10*3/uL — ABNORMAL HIGH (ref 1.7–7.7)
Neutrophils Relative %: 83 %
Platelets: 313 10*3/uL (ref 150–400)
RBC: 2.11 MIL/uL — ABNORMAL LOW (ref 4.22–5.81)
RDW: 15.3 % (ref 11.5–15.5)
WBC: 19.9 10*3/uL — ABNORMAL HIGH (ref 4.0–10.5)
nRBC: 0.1 % (ref 0.0–0.2)

## 2022-07-22 LAB — COMPREHENSIVE METABOLIC PANEL
ALT: 10 U/L (ref 0–44)
AST: 18 U/L (ref 15–41)
Albumin: 2.1 g/dL — ABNORMAL LOW (ref 3.5–5.0)
Alkaline Phosphatase: 42 U/L (ref 38–126)
Anion gap: 7 (ref 5–15)
BUN: 53 mg/dL — ABNORMAL HIGH (ref 8–23)
CO2: 23 mmol/L (ref 22–32)
Calcium: 8.2 mg/dL — ABNORMAL LOW (ref 8.9–10.3)
Chloride: 112 mmol/L — ABNORMAL HIGH (ref 98–111)
Creatinine, Ser: 1.11 mg/dL (ref 0.61–1.24)
GFR, Estimated: >60 mL/min (ref >60)
Glucose, Bld: 170 mg/dL — ABNORMAL HIGH (ref 70–99)
Potassium: 4.3 mmol/L (ref 3.5–5.1)
Sodium: 142 mmol/L (ref 135–145)
Total Bilirubin: 0.7 mg/dL (ref 0.3–1.2)
Total Protein: 4.4 g/dL — ABNORMAL LOW (ref 6.5–8.1)

## 2022-07-22 LAB — PREPARE RBC (CROSSMATCH)

## 2022-07-22 LAB — PROTIME-INR
INR: 1.8 — ABNORMAL HIGH (ref 0.8–1.2)
Prothrombin Time: 20.5 seconds — ABNORMAL HIGH (ref 11.4–15.2)

## 2022-07-22 LAB — CBC
HCT: 19 % — ABNORMAL LOW (ref 39.0–52.0)
Hemoglobin: 5.7 g/dL — ABNORMAL LOW (ref 13.0–17.0)
MCH: 27.8 pg (ref 26.0–34.0)
MCHC: 30 g/dL (ref 30.0–36.0)
MCV: 92.7 fL (ref 80.0–100.0)
Platelets: 341 10*3/uL (ref 150–400)
RBC: 2.05 MIL/uL — ABNORMAL LOW (ref 4.22–5.81)
RDW: 15.4 % (ref 11.5–15.5)
WBC: 18.1 10*3/uL — ABNORMAL HIGH (ref 4.0–10.5)
nRBC: 0.1 % (ref 0.0–0.2)

## 2022-07-22 LAB — CULTURE, BLOOD (ROUTINE X 2)
Culture: NO GROWTH
Culture: NO GROWTH
Special Requests: ADEQUATE
Special Requests: ADEQUATE

## 2022-07-22 LAB — HEMOGLOBIN AND HEMATOCRIT, BLOOD
HCT: 28.4 % — ABNORMAL LOW (ref 39.0–52.0)
Hemoglobin: 9.4 g/dL — ABNORMAL LOW (ref 13.0–17.0)

## 2022-07-22 LAB — APTT: aPTT: 30 seconds (ref 24–36)

## 2022-07-22 MED ORDER — SODIUM CHLORIDE 0.9% IV SOLUTION: Freq: Once | INTRAVENOUS | Status: DC

## 2022-07-22 MED ORDER — PANTOPRAZOLE SODIUM 40 MG IV SOLR
40.0000 mg | Freq: Two times a day (BID) | INTRAVENOUS | Status: DC
  Administered 2022-07-22 – 2022-07-23 (×2): 40 mg via INTRAVENOUS
  Filled 2022-07-22 (×2): qty 10

## 2022-07-22 MED ORDER — LACTATED RINGERS IV BOLUS
1000.0000 mL | Freq: Once | INTRAVENOUS | Status: AC
  Administered 2022-07-22: 1000 mL via INTRAVENOUS

## 2022-07-22 NOTE — Progress Notes (Addendum)
       CROSS COVER NOTE  NAME: Todd Trujillo. MRN: [id number] DOB : October 02, 1929 ATTENDING PHYSICIAN: Jennye Boroughs, MD    Date of Service   07/22/2022   HPI/Events of Note   Message received from RN reporting hypotension -->BP 76/44 map of 54. Manual BP 90/42. Todd Trujillo is also having large liquid black/ bloody stools. Has had a total of 3 stools tonight, the last stool was reported as bloody with small blood clots. Stool was Maroon in color. Manual BP 90/42.  On bedside evaluation Todd Trujillo is alert and pleasantly confused secondary to PMH of dementia. His upper extremities are cool to the touch which daughter at bedside reports is his baseline. +2 radial and pedal pulses. He denies abdominal pain and does not display signs of pain when I palpate his abdomen. He has hypoactive bowel signs.    Todd Trujillo is a 89 y.o. male with medical history significant of dementia, hypertension, hyperlipidemia, diastolic CHF, stroke, GERD, dementia, skin cancer, prostate cancer, kidney stone, aspiration pneumonia, colitis, who presented with shortness breath. He was found to be COVID positive and is also being treated for aspiration pneumonia.  Interventions   Assessment/Plan:  Acute Blood Loss Anemia secondary to Lower GI Bleed (HGB 5.7 down from 8.7 yesterday morning) 1L LR Bolus CBC, CMP (BUN 53), Coags (elevated), Type/Screen Transfuse 2U PRBC, monitor respiratory status  Daily Aspirin and Lovenox discontinued, SCDs ordered for DVT prophylaxis GI consulted, staff message sent to Dr Alice Reichert       To reach the provider On-Call:   7AM- 7PM see care teams to locate the attending and reach out to them via [email or web address]. 7PM-7AM contact night-coverage If you still have difficulty reaching the appropriate provider, please page the [facility] (Director on Call) for [facility] on amion for assistance  This document was prepared using Set designer software and may  include unintentional dictation errors.  Neomia Glass DNP, MBA, FNP-BC Nurse Practitioner [facility] Pager [phone]

## 2022-07-22 NOTE — TOC Progression Note (Addendum)
Transition of Care [facility]) - Progression Note    Patient Details  Name: Todd Trujillo. MRN: [id number] Date of Birth: 1929-10-29  Transition of [facility]) CM/SW Contact  Izola Price, RN Phone Number: 07/22/2022, 12:49 PM  Clinical Narrative:  1/7: Patient not medically stable today per NP hospitalist  note indicating episode of low blood pressure and bloody stools. HGB was also reported as down to 5.7 today from 8.7 at yesterday am labs per same note. TOC will continue to follow. Simmie Davies RN CM      Expected Discharge Plan:  (TBD) Barriers to Discharge: Continued Medical Work up  Expected Discharge Plan and Services                                               Social Determinants of Health (SDOH) Interventions SDOH Screenings   Food Insecurity: No Food Insecurity (07/19/2022)  Housing: Low Risk  (06/08/2022)  Transportation Needs: No Transportation Needs (07/19/2022)  Recent Concern: Transportation Needs - Unmet Transportation Needs (06/08/2022)  Utilities: Not At Risk (07/19/2022)  Depression (PHQ2-9): Low Risk  (06/21/2022)  Tobacco Use: Low Risk  (07/17/2022)    Readmission Risk Interventions     No data to display

## 2022-07-22 NOTE — Consult Note (Signed)
Todd Trujillo   Todd Trujillo, M.D.  Reason for Consult: Melena, hypotension, acute post-hemorrhagic anemia.   Attending Requesting Consult: Todd Trujillo, M.D.  Outpatient Primary Physician: Todd Trujillo, M.D.  History of Present Illness: Todd Trujillo. is a 89 y.o. male with history of dementia, hypertension, hyperlipidemia, chronic diastolic CHF, stroke, GERD, dementia, skin cancer, prostate cancer, kidney stone, aspiration pneumonia, colitis admitted for Covid Pneumonia, nausea and vomiting  on 07/17/2022. GI has been called for overnight symptoms of three tarry stools coinciding with hypotensive spell and noted 3 g drop of Hgb to 5.7. Patient had been on lovenox and asprin which was discontinued this morning. Patient appears pleasantly confused and not agitated. He is listed as DNR status. Patient diagnosed approximately a year ago with a mass in the ascending colon on the CT scan of the abdomen and pelvis.  Had surgical consultation with Dr. Brett Albino who at the time discounted any etiology of abdominal pain or anemia to the mass.   Patient's Granddaughter, Todd Trujillo, at patient bedside.  Past Medical History:  Past Medical History:  Diagnosis Date   Arthritis    Cancer ([place])    prostate   Hyperlipidemia    Hypertension    Left wrist fracture    Macular degeneration of right eye    Nephrolithiasis    Prostate CA ([place]) 2003   Prostate troubles    Patient was unsure of term   Squamous cell carcinoma of skin 01/23/2017   R distal lat bicep near elbow   Squamous cell carcinoma of skin 03/05/2022   L forearm posterior - tx with ED&C    Problem List: Patient Active Problem List   Diagnosis Date Noted   Protein-calorie malnutrition, severe 07/19/2022   Acute respiratory disease due to COVID-19 virus 07/17/2022   Severe sepsis ([place]) 07/17/2022   Acute respiratory failure with hypoxia (HCC) 07/17/2022   Nausea &  vomiting 07/17/2022   Rash 07/09/2022   Nausea and vomiting 06/09/2022   Acute colitis 06/08/2022   Hematuria 06/08/2022   Thickened nails 02/18/2022   Open wound of heel 10/21/2021   Skin lesion 10/21/2021   Cancer of right colon ([place]) 10/13/2021   Colonic mass 10/08/2021   Iron deficiency anemia 10/08/2021   Vascular dementia ([place]) 09/15/2021   Benign essential hypertension 09/15/2021   Cerebral infarction, unspecified ([place]) 09/15/2021   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   History of subdural hematoma 08/19/2021   Aspiration pneumonia ([place]) 06/11/2021   Dementia without behavioral disturbance ([place]) 06/11/2021   Right femoral fracture ([place]) 06/06/2021   Leucocytosis 06/06/2021   Obsessive behavior 03/11/2021   History of CVA (cerebrovascular accident) 09/21/2020   Renal mass 09/21/2020   Weakness 09/21/2020   Cerebral atrophy ([place]) 09/15/2020   Cerebrovascular accident (CVA) ([place])    Left facial numbness 09/12/2019   HTN (hypertension) 09/12/2019   GERD (gastroesophageal reflux disease) 09/12/2019   Arthritis 08/13/2018   Benign prostatic hyperplasia 08/13/2018   Diverticulosis 08/13/2018   History of anemia 08/13/2018   History of colonic polyps 08/13/2018   History of nephrolithiasis 08/13/2018   History of pneumonia 08/13/2018   Unintentional weight loss 12/15/2016   Healthcare maintenance 12/14/2016   Skin lesion of chest wall 09/23/2016   Symptomatic anemia 09/17/2016   Hypercholesterolemia 09/17/2016   Incomplete emptying of bladder 02/02/2016   TIA (transient ischemic attack) 01/08/2016   Macular degeneration 01/08/2016   History of prostate cancer 01/08/2016  PVD (peripheral vascular disease) ([place]) 10/12/2015   SCC (squamous cell carcinoma) 01/24/2015   Bladder outlet obstruction 04/15/2013   Acquired cyst of kidney 04/16/2012   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/2012    Past Surgical History: Past Surgical History:   Procedure Laterality Date   INTRAMEDULLARY (IM) NAIL INTERTROCHANTERIC Right 06/07/2021   Procedure: INTRAMEDULLARY (IM) NAIL INTERTROCHANTRIC;  Surgeon: Thornton Park, MD;  Location: [facility] ORS;  Service: Orthopedics;  Laterality: Right;   TOTAL KNEE ARTHROPLASTY Right     Allergies: Allergies  Allergen Reactions   Codeine Nausea And Vomiting and Nausea Only    Other reaction(s): Vomiting    Home Medications: Medications Prior to Admission  Medication Sig Dispense Refill Last Dose   aspirin EC 81 MG tablet Take 1 tablet (81 mg total) by mouth daily. Swallow whole. 30 tablet 0 07/16/2022   cholecalciferol (VITAMIN D3) 25 MCG (1000 UNIT) tablet Take 1,000 Units by mouth daily.   07/16/2022   ciprofloxacin (CILOXAN) 0.3 % ophthalmic solution PLACE 1 DROP INTO BOTH EYES EVERY 6 HOURS 5 mL 0    pimecrolimus (ELIDEL) 1 % cream Apply topically 2 (two) times daily. 30 g 0 07/16/2022   QUEtiapine (SEROQUEL) 50 MG tablet TAKE 1 TABLET BY MOUTH AT BEDTIME. 30 tablet 1 07/16/2022   acetaminophen (TYLENOL) 500 MG tablet Take 500 mg by mouth every 6 (six) hours as needed. (Patient not taking: Reported on 06/21/2022)      Nutritional Supplements (FEEDING SUPPLEMENT, NEPRO CARB STEADY,) LIQD Take 237 mLs by mouth 2 (two) times daily between meals.  0    Home medication reconciliation was completed with the patient.   Scheduled Inpatient Medications:    sodium chloride   Intravenous Once   cholecalciferol  1,000 Units Oral Daily   feeding supplement  237 mL Oral TID BM   multivitamin with minerals  1 tablet Oral Daily   QUEtiapine  25 mg Oral QHS    Continuous Inpatient Infusions:    PRN Inpatient Medications:  acetaminophen, acetaminophen, albuterol, dextromethorphan-guaiFENesin, hydrALAZINE, metoprolol tartrate, ondansetron (ZOFRAN) IV  Family History: family history includes Breast cancer in his daughter; Colon cancer in his mother; Esophageal cancer in his brother.   GI Family History:  Unknown  Social History:   reports that he has never smoked. He has never been exposed to tobacco smoke. He has never used smokeless tobacco. He reports that he does not currently use drugs. He reports that he does not drink alcohol. The patient denies ETOH, tobacco, or drug use.    Review of Systems: Review of Systems -  patient is nonverbal  Physical Examination: BP (!) 157/78   Pulse 79   Temp 97.9 F (36.6 C) (Oral)   Resp 18   Ht '5\' 9"'$  (1.753 m)   Wt 63.9 kg   SpO2 100%   BMI 20.79 kg/m  Physical Exam HENT:     Head: Normocephalic and atraumatic.  Cardiovascular:     Rate and Rhythm: Normal rate.  Pulmonary:     Effort: Pulmonary effort is normal.     Breath sounds: Normal breath sounds. No wheezing.  Chest:     Chest wall: No mass or deformity.  Abdominal:     General: Bowel sounds are normal.     Palpations: Abdomen is soft. There is no splenomegaly.     Tenderness: There is no abdominal tenderness.  Musculoskeletal:     Right lower leg: No tenderness. No edema.  Skin:    General:  Skin is warm and dry.  Neurological:     Mental Status: He is alert. He is disoriented.  Psychiatric:        Behavior: Behavior is not agitated.     Data: Lab Results  Component Value Date   WBC 19.9 (H) 07/22/2022   HGB 5.8 (L) 07/22/2022   HCT 19.4 (L) 07/22/2022   MCV 91.9 07/22/2022   PLT 313 07/22/2022   Recent Labs  Lab 07/21/22 0608 07/22/22 0119 07/22/22 0326  HGB 8.7* 5.7* 5.8*   Lab Results  Component Value Date   NA 142 07/22/2022   K 4.3 07/22/2022   CL 112 (H) 07/22/2022   CO2 23 07/22/2022   BUN 53 (H) 07/22/2022   CREATININE 1.11 07/22/2022   Lab Results  Component Value Date   ALT 10 07/22/2022   AST 18 07/22/2022   ALKPHOS 42 07/22/2022   BILITOT 0.7 07/22/2022   Recent Labs  Lab 07/22/22 0119  APTT 30  INR 1.8*      Latest Ref Rng & Units 07/22/2022    3:26 AM 07/22/2022    1:19 AM 07/21/2022    6:08 AM  CBC  WBC 4.0 - 10.5 K/uL 19.9   18.1  17.0   Hemoglobin 13.0 - 17.0 g/dL 5.8  5.7  8.7   Hematocrit 39.0 - 52.0 % 19.4  19.0  28.5   Platelets 150 - 400 K/uL 313  341  326     STUDIES: No results found. '@IMAGES'$ @  Assessment:  GI bleed, likely upper - Ddx includes PUD, vascular malformation (dieulafoy lesion), malignancy, ischemic small bowel, etc.). Anemia secondary to acute GI blood loss. Hypotension due to acute GI bleeding. CHF Dementia. Nausea and vomiting. Failure to thrive. Dysphagia with aspiration pneumonia. Covid 19 pneumonia   Recommendations:  1.  IV acid suppression. 2.  Serial H&H, supplement as needed. 3.  I discussed the patient's case with 2 of his daughters, Amy Child psychotherapist and Gladys Damme, the latter individual having [place].  I discussed options including aggressive options with endoscopy with or without colonoscopy versus supportive therapy for pain control, hydration.  I discussed the indications, risks, alternatives potential complications of endoscopy with anesthesia.  Currently, a decision has not yet been made and the family has assured me they will discuss before presenting their decisions on further action for the patient's current problems. 4.  Will follow along supportively for now  Thank you for the consult. Please call with questions or concerns.  Olean Ree, "Lanny Hurst MD [facility] [place], [place] [address] [phone]  07/22/2022 2:16 PM

## 2022-07-22 NOTE — Progress Notes (Addendum)
Progress Note    Todd Trujillo.  UMP:[id number] DOB: 03/23/1930  DOA: 07/17/2022 PCP: Einar Pheasant, MD      Brief Narrative:    Medical records reviewed and are as summarized below:  Todd Trujillo. is a 89 y.o. male male with medical history significant for recent discharge from the hospital on 06/11/2022 for aspiration pneumonia, hypoxia and acute colitis; dementia, hypertension, hyperlipidemia, chronic diastolic CHF, stroke, GERD, dementia, skin cancer, prostate cancer, kidney stone, aspiration pneumonia, colitis, who presented to the hospital with cough, shortness of breath and vomiting.  Her daughter said that patient had been ill and vomiting since Thanksgiving and they attributed this to aspiration pneumonia.  He tested positive for COVID-19 infection a day prior to admission.       Assessment/Plan:   Principal Problem:   Acute respiratory disease due to COVID-19 virus Active Problems:   Acute respiratory failure with hypoxia (HCC)   Aspiration pneumonia (HCC)   Severe sepsis (HCC)   Dementia without behavioral disturbance (HCC)   HTN (hypertension)   History of CVA (cerebrovascular accident)   Nausea & vomiting   Protein-calorie malnutrition, severe   Acute GI bleeding   Acute blood loss anemia    Body mass index is 20.79 kg/m.   Severe sepsis secondary to COVID-19 pneumonia, recent aspiration pneumonia: Completed Paxlovid and IV Unasyn on 07/22/2022.    Acute hypoxic respiratory failure: Improved.  He is tolerating room air.  Discontinue IV dexamethasone because of GI bleed   Acute GI bleeding, Melena: Aspirin, prophylactic Lovenox and dexamethasone have been discontinued.  Start IV Protonix.  Follow-up with gastroenterologist.   Acute blood loss anemia: Patient is getting 2 units of packed red blood cells.  Follow-up H&H posttransfusion.  Hypotension: Improved after IV Ringer's lactate bolus  Poor oral intake, dysphagia:  Speech therapy is recommended NPO because of concern for aspiration.  However, his family insists that they want him to have a diet despite the risk of aspiration.  Amy said that family is aware of the risk of aspiration but patient has always done well with swallowing at home.  Nausea and vomiting: Improved.  Antiemetics as needed.  General weakness: PT and OT recommended home health therapy  Other comorbidities include dementia, history of stroke       Diet Order             DIET DYS 2 Room service appropriate? No; Fluid consistency: Thin  Diet effective now                            Consultants: None  Procedures: None    Medications:    sodium chloride   Intravenous Once   cholecalciferol  1,000 Units Oral Daily   feeding supplement  237 mL Oral TID BM   multivitamin with minerals  1 tablet Oral Daily   pantoprazole (PROTONIX) IV  40 mg Intravenous Q12H   QUEtiapine  25 mg Oral QHS   Continuous Infusions:     Anti-infectives (From admission, onward)    Start     Dose/Rate Route Frequency Ordered Stop   07/17/22 1800  Ampicillin-Sulbactam (UNASYN) 3 g in sodium chloride 0.9 % 100 mL IVPB        3 g 200 mL/hr over 30 Minutes Intravenous Every 6 hours 07/17/22 1306 07/22/22 1402   07/17/22 1300  nirmatrelvir/ritonavir (PAXLOVID) 3 tablet        3  tablet Oral 2 times daily 07/17/22 1251 07/22/22 0759   07/17/22 1030  Ampicillin-Sulbactam (UNASYN) 3 g in sodium chloride 0.9 % 100 mL IVPB        3 g 200 mL/hr over 30 Minutes Intravenous  Once 07/17/22 1026 07/17/22 1215              Family Communication/Anticipated D/C date and plan/Code Status   DVT prophylaxis: Place and maintain sequential compression device Start: 07/22/22 0049     Code Status: DNR  Family Communication: Wife and daughter, at the bedside Disposition Plan: Plan to discharge home with 1 to 2 days   Status is: Inpatient Remains inpatient appropriate because:  COVID-19 infection, debility       Subjective:   Interval events noted.  Patient had black tarry stools overnight with significant drop in hemoglobin.  His wife and daughter, Amy, were at the bedside.  Objective:    Vitals:   07/22/22 0823 07/22/22 1032 07/22/22 1050 07/22/22 1335  BP: (!) 145/74 139/70 119/70 (!) 157/78  Pulse: 89 85 78 79  Resp: '18 18 16 18  '$ Temp: (!) 97.5 F (36.4 C) 98.1 F (36.7 C) 98 F (36.7 C) 97.9 F (36.6 C)  TempSrc:  Oral Oral Oral  SpO2: 97% 98% 97% 100%  Weight:      Height:       No data found.   Intake/Output Summary (Last 24 hours) at 07/22/2022 1521 Last data filed at 07/22/2022 1330 Gross per 24 hour  Intake 1648.39 ml  Output 700 ml  Net 948.39 ml   Filed Weights   07/17/22 0949 07/18/22 2153 07/19/22 1527  Weight: 63.5 kg 66.8 kg 63.9 kg    Exam:   GEN: NAD SKIN: Warm and dry EYES: Pale but anicteric ENT: MMM CV: RRR PULM: No wheezing or rales heard ABD: soft, ND, NT, +BS CNS: Alert but confused and unable to provide any history EXT: No edema or tenderness    Data Reviewed:   I have personally reviewed following labs and imaging studies:  Labs: Labs show the following:   Basic Metabolic Panel: Recent Labs  Lab 07/18/22 0610 07/19/22 0621 07/20/22 0710 07/21/22 0608 07/22/22 0119  NA 142 145 144 144 142  K 3.9 4.0 3.7 3.9 4.3  CL 110 110 111 109 112*  CO2 '24 27 28 26 23  '$ GLUCOSE 114* 142* 161* 131* 170*  BUN 16 19 26* 25* 53*  CREATININE 0.74 0.70 0.76 0.71 1.11  CALCIUM 8.3* 9.0 8.7* 8.7* 8.2*  MG  --  2.1  --  2.1  --   PHOS  --  2.9  --  2.5  --    GFR Estimated Creatinine Clearance: 40 mL/min (by C-G formula based on SCr of 1.11 mg/dL). Liver Function Tests: Recent Labs  Lab 07/17/22 1012 07/19/22 0621 07/20/22 0710 07/22/22 0119  AST '22 18 18 18  '$ ALT '10 7 8 10  '$ ALKPHOS 95 71 62 42  BILITOT 0.8 0.8 0.7 0.7  PROT 7.8 6.3* 5.7* 4.4*  ALBUMIN 3.5 2.6* 2.5* 2.1*   No results for  input(s): "LIPASE", "AMYLASE" in the last 168 hours. No results for input(s): "AMMONIA" in the last 168 hours. Coagulation profile Recent Labs  Lab 07/17/22 1012 07/22/22 0119  INR 1.3* 1.8*    CBC: Recent Labs  Lab 07/17/22 1146 07/18/22 0610 07/19/22 0621 07/20/22 0710 07/21/22 0608 07/22/22 0119 07/22/22 0326  WBC 15.4*   < > 13.6* 18.2* 17.0* 18.1* 19.9*  NEUTROABS 14.1*  --  11.7* 15.8* 14.8*  --  16.7*  HGB 11.4*   < > 9.6* 9.1* 8.7* 5.7* 5.8*  HCT 37.1*   < > 32.2* 30.8* 28.5* 19.0* 19.4*  MCV 90.7   < > 93.6 92.5 90.8 92.7 91.9  PLT 407*   < > 324 325 326 341 313   < > = values in this interval not displayed.   Cardiac Enzymes: No results for input(s): "CKTOTAL", "CKMB", "CKMBINDEX", "TROPONINI" in the last 168 hours. BNP (last 3 results) No results for input(s): "PROBNP" in the last 8760 hours. CBG: No results for input(s): "GLUCAP" in the last 168 hours. D-Dimer: No results for input(s): "DDIMER" in the last 72 hours.  Hgb A1c: No results for input(s): "HGBA1C" in the last 72 hours. Lipid Profile: No results for input(s): "CHOL", "HDL", "LDLCALC", "TRIG", "CHOLHDL", "LDLDIRECT" in the last 72 hours. Thyroid function studies: No results for input(s): "TSH", "T4TOTAL", "T3FREE", "THYROIDAB" in the last 72 hours.  Invalid input(s): "FREET3" Anemia work up: No results for input(s): "VITAMINB12", "FOLATE", "FERRITIN", "TIBC", "IRON", "RETICCTPCT" in the last 72 hours. Sepsis Labs: Recent Labs  Lab 07/17/22 1146 07/17/22 1153 07/18/22 0610 07/20/22 0710 07/21/22 0608 07/22/22 0119 07/22/22 0326  PROCALCITON 0.13  --   --   --   --   --   --   WBC 15.4*  --    < > 18.2* 17.0* 18.1* 19.9*  LATICACIDVEN 2.1* 1.2  --   --   --   --   --    < > = values in this interval not displayed.    Microbiology Recent Results (from the past 240 hour(s))  Culture, blood (Routine x 2)     Status: None   Collection Time: 07/17/22 10:12 AM   Specimen: BLOOD LEFT ARM   Result Value Ref Range Status   Specimen Description BLOOD LEFT ARM  Final   Special Requests   Final    BOTTLES DRAWN AEROBIC AND ANAEROBIC Blood Culture adequate volume   Culture   Final    NO GROWTH 5 DAYS Performed at [facility], [address] [place], [place] [address]    Report Status 07/22/2022 FINAL  Final  Resp panel by RT-PCR (RSV, Flu A&B, Covid) Anterior Nasal Swab     Status: Abnormal   Collection Time: 07/17/22 10:12 AM   Specimen: Anterior Nasal Swab  Result Value Ref Range Status   SARS Coronavirus 2 by RT PCR POSITIVE (A) NEGATIVE Final    Comment: (NOTE) SARS-CoV-2 target nucleic acids are DETECTED.  The SARS-CoV-2 RNA is generally detectable in upper respiratory specimens during the acute phase of infection. Positive results are indicative of the presence of the identified virus, but do not rule out bacterial infection or co-infection with other pathogens not detected by the test. Clinical correlation with patient history and other diagnostic information is necessary to determine patient infection status. The expected result is Negative.  Fact Sheet for Patients: EntrepreneurPulse.com.au  Fact Sheet for Healthcare Providers: IncredibleEmployment.be  This test is not yet approved or cleared by the [place] FDA and  has been authorized for detection and/or diagnosis of SARS-CoV-2 by FDA under an Emergency Use Authorization (EUA).  This EUA will remain in effect (meaning this test can be used) for the duration of  the COVID-19 declaration under Section 564(b)(1) of the A ct, 21 U.S.C. section 360bbb-3(b)(1), unless the authorization is terminated or revoked sooner.     Influenza A  by PCR NEGATIVE NEGATIVE Final   Influenza B by PCR NEGATIVE NEGATIVE Final    Comment: (NOTE) The Xpert Xpress SARS-CoV-2/FLU/RSV plus assay is intended as an aid in the diagnosis of influenza from Nasopharyngeal swab  specimens and should not be used as a sole basis for treatment. Nasal washings and aspirates are unacceptable for Xpert Xpress SARS-CoV-2/FLU/RSV testing.  Fact Sheet for Patients: EntrepreneurPulse.com.au  Fact Sheet for Healthcare Providers: IncredibleEmployment.be  This test is not yet approved or cleared by the [place] FDA and has been authorized for detection and/or diagnosis of SARS-CoV-2 by FDA under an Emergency Use Authorization (EUA). This EUA will remain in effect (meaning this test can be used) for the duration of the COVID-19 declaration under Section 564(b)(1) of the Act, 21 U.S.C. section 360bbb-3(b)(1), unless the authorization is terminated or revoked.     Resp Syncytial Virus by PCR NEGATIVE NEGATIVE Final    Comment: (NOTE) Fact Sheet for Patients: EntrepreneurPulse.com.au  Fact Sheet for Healthcare Providers: IncredibleEmployment.be  This test is not yet approved or cleared by the [place] FDA and has been authorized for detection and/or diagnosis of SARS-CoV-2 by FDA under an Emergency Use Authorization (EUA). This EUA will remain in effect (meaning this test can be used) for the duration of the COVID-19 declaration under Section 564(b)(1) of the Act, 21 U.S.C. section 360bbb-3(b)(1), unless the authorization is terminated or revoked.  Performed at [facility], [address] [place], [place] [address]   Culture, blood (Routine x 2)     Status: None   Collection Time: 07/17/22 11:46 AM   Specimen: BLOOD  Result Value Ref Range Status   Specimen Description BLOOD LEFT ANTECUBITAL  Final   Special Requests   Final    BOTTLES DRAWN AEROBIC AND ANAEROBIC Blood Culture adequate volume   Culture   Final    NO GROWTH 5 DAYS Performed at [facility], [address], [place], [place] [address]    Report Status 07/22/2022 FINAL  Final    Procedures  and diagnostic studies:  No results found.             LOS: 5 days   Twylia Oka  [facility]   Pager on [email or web address]. If 7PM-7AM, please contact night-coverage at [email or web address]     07/22/2022, 3:21 PM

## 2022-07-23 ENCOUNTER — Telehealth: Payer: Self-pay | Admitting: Internal Medicine

## 2022-07-23 DIAGNOSIS — D62 Acute posthemorrhagic anemia: Secondary | ICD-10-CM | POA: Diagnosis not present

## 2022-07-23 DIAGNOSIS — K922 Gastrointestinal hemorrhage, unspecified: Secondary | ICD-10-CM | POA: Diagnosis not present

## 2022-07-23 DIAGNOSIS — U071 COVID-19: Secondary | ICD-10-CM | POA: Diagnosis not present

## 2022-07-23 DIAGNOSIS — A419 Sepsis, unspecified organism: Secondary | ICD-10-CM | POA: Diagnosis not present

## 2022-07-23 LAB — CBC WITH DIFFERENTIAL/PLATELET
Abs Immature Granulocytes: 0.56 10*3/uL — ABNORMAL HIGH (ref 0.00–0.07)
Basophils Absolute: 0 10*3/uL (ref 0.0–0.1)
Basophils Relative: 0 %
Eosinophils Absolute: 0 10*3/uL (ref 0.0–0.5)
Eosinophils Relative: 0 %
HCT: 27.2 % — ABNORMAL LOW (ref 39.0–52.0)
Hemoglobin: 8.8 g/dL — ABNORMAL LOW (ref 13.0–17.0)
Immature Granulocytes: 3 %
Lymphocytes Relative: 10 %
Lymphs Abs: 1.7 10*3/uL (ref 0.7–4.0)
MCH: 28.8 pg (ref 26.0–34.0)
MCHC: 32.4 g/dL (ref 30.0–36.0)
MCV: 88.9 fL (ref 80.0–100.0)
Monocytes Absolute: 1.5 10*3/uL — ABNORMAL HIGH (ref 0.1–1.0)
Monocytes Relative: 9 %
Neutro Abs: 13.5 10*3/uL — ABNORMAL HIGH (ref 1.7–7.7)
Neutrophils Relative %: 78 %
Platelets: 270 10*3/uL (ref 150–400)
RBC: 3.06 MIL/uL — ABNORMAL LOW (ref 4.22–5.81)
RDW: 15.5 % (ref 11.5–15.5)
WBC: 17.3 10*3/uL — ABNORMAL HIGH (ref 4.0–10.5)
nRBC: 0.5 % — ABNORMAL HIGH (ref 0.0–0.2)

## 2022-07-23 LAB — BASIC METABOLIC PANEL
Anion gap: 7 (ref 5–15)
BUN: 45 mg/dL — ABNORMAL HIGH (ref 8–23)
CO2: 25 mmol/L (ref 22–32)
Calcium: 8.3 mg/dL — ABNORMAL LOW (ref 8.9–10.3)
Chloride: 115 mmol/L — ABNORMAL HIGH (ref 98–111)
Creatinine, Ser: 0.92 mg/dL (ref 0.61–1.24)
GFR, Estimated: >60 mL/min (ref >60)
Glucose, Bld: 119 mg/dL — ABNORMAL HIGH (ref 70–99)
Potassium: 3.8 mmol/L (ref 3.5–5.1)
Sodium: 147 mmol/L — ABNORMAL HIGH (ref 135–145)

## 2022-07-23 NOTE — Progress Notes (Signed)
[facility] Gastroenterology Inpatient Progress Note    Subjective: Patient seen for follow-up of GI bleeding.  Patient's daughter, Amy, is at the bedside.  She tells me that she and her sister have discussed their options and she is for comfort/supportive care only at this time.  Patient continues to bleed at a slow steady rate.  He surprisingly is under no acute distress.  Objective: Vital signs in last 24 hours: Temp:  [97.5 F (36.4 C)-98.1 F (36.7 C)] 97.8 F (36.6 C) (01/08 0530) Pulse Rate:  [76-89] 77 (01/08 0530) Resp:  [15-18] 15 (01/08 0530) BP: (119-157)/(68-83) 149/79 (01/08 0530) SpO2:  [94 %-100 %] 98 % (01/08 0530) Blood pressure (!) 149/79, pulse 77, temperature 97.8 F (36.6 C), temperature source Axillary, resp. rate 15, height '5\' 9"'$  (1.753 m), weight 63.9 kg, SpO2 98 %.    Intake/Output from previous day: 01/07 0701 - 01/08 0700 In: 737.5 [Blood:737.5] Out: -   Intake/Output this shift: No intake/output data recorded.   Gen: NAD. Appears comfortable.  HEENT: [place]/AT. PERRLA. Normal external ear exam.  Chest: CTA, no wheezes.  CV: RR nl S1, S2. No gallops.  Abd: soft, nt, nd. BS+  Ext: no edema. Pulses 2+  Neuro: Patient pleasantly confused with eyes open, nonverbal.  Lab Results: Results for orders placed or performed during the hospital encounter of 07/17/22 (from the past 24 hour(s))  Hemoglobin and hematocrit, blood     Status: Abnormal   Collection Time: 07/22/22  4:09 PM  Result Value Ref Range   Hemoglobin 9.4 (L) 13.0 - 17.0 g/dL   HCT 28.4 (L) 39.0 - 52.0 %  CBC with Differential/Platelet     Status: Abnormal   Collection Time: 07/23/22  6:44 AM  Result Value Ref Range   WBC 17.3 (H) 4.0 - 10.5 K/uL   RBC 3.06 (L) 4.22 - 5.81 MIL/uL   Hemoglobin 8.8 (L) 13.0 - 17.0 g/dL   HCT 27.2 (L) 39.0 - 52.0 %   MCV 88.9 80.0 - 100.0 fL   MCH 28.8 26.0 - 34.0 pg   MCHC 32.4 30.0 - 36.0 g/dL   RDW 15.5 11.5 - 15.5 %   Platelets 270 150 -  400 K/uL   nRBC 0.5 (H) 0.0 - 0.2 %   Neutrophils Relative % 78 %   Neutro Abs 13.5 (H) 1.7 - 7.7 K/uL   Lymphocytes Relative 10 %   Lymphs Abs 1.7 0.7 - 4.0 K/uL   Monocytes Relative 9 %   Monocytes Absolute 1.5 (H) 0.1 - 1.0 K/uL   Eosinophils Relative 0 %   Eosinophils Absolute 0.0 0.0 - 0.5 K/uL   Basophils Relative 0 %   Basophils Absolute 0.0 0.0 - 0.1 K/uL   Immature Granulocytes 3 %   Abs Immature Granulocytes 0.56 (H) 0.00 - 0.07 K/uL  Basic metabolic panel     Status: Abnormal   Collection Time: 07/23/22  6:44 AM  Result Value Ref Range   Sodium 147 (H) 135 - 145 mmol/L   Potassium 3.8 3.5 - 5.1 mmol/L   Chloride 115 (H) 98 - 111 mmol/L   CO2 25 22 - 32 mmol/L   Glucose, Bld 119 (H) 70 - 99 mg/dL   BUN 45 (H) 8 - 23 mg/dL   Creatinine, Ser 0.92 0.61 - 1.24 mg/dL   Calcium 8.3 (L) 8.9 - 10.3 mg/dL   GFR, Estimated >60 >60 mL/min   Anion gap 7 5 - 15     Recent Labs  07/22/22 0119 07/22/22 0326 07/22/22 1609 07/23/22 0644  WBC 18.1* 19.9*  --  17.3*  HGB 5.7* 5.8* 9.4* 8.8*  HCT 19.0* 19.4* 28.4* 27.2*  PLT 341 313  --  270   BMET Recent Labs    07/21/22 0608 07/22/22 0119 07/23/22 0644  NA 144 142 147*  K 3.9 4.3 3.8  CL 109 112* 115*  CO2 '26 23 25  '$ GLUCOSE 131* 170* 119*  BUN 25* 53* 45*  CREATININE 0.71 1.11 0.92  CALCIUM 8.7* 8.2* 8.3*   LFT Recent Labs    07/22/22 0119  PROT 4.4*  ALBUMIN 2.1*  AST 18  ALT 10  ALKPHOS 42  BILITOT 0.7   PT/INR Recent Labs    07/22/22 0119  LABPROT 20.5*  INR 1.8*   Hepatitis Panel No results for input(s): "HEPBSAG", "HCVAB", "HEPAIGM", "HEPBIGM" in the last 72 hours. C-Diff No results for input(s): "CDIFFTOX" in the last 72 hours. No results for input(s): "CDIFFPCR" in the last 72 hours.   Studies/Results: No results found.  Scheduled Inpatient Medications:    sodium chloride   Intravenous Once   cholecalciferol  1,000 Units Oral Daily   feeding supplement  237 mL Oral TID BM    multivitamin with minerals  1 tablet Oral Daily   pantoprazole (PROTONIX) IV  40 mg Intravenous Q12H   QUEtiapine  25 mg Oral QHS    Continuous Inpatient Infusions:    PRN Inpatient Medications:  acetaminophen, acetaminophen, albuterol, dextromethorphan-guaiFENesin, hydrALAZINE, metoprolol tartrate, ondansetron (ZOFRAN) IV   Assessment:  GI bleed, likely upper - Ddx includes PUD, vascular malformation (dieulafoy lesion), malignancy, ischemic small bowel, etc.). Anemia secondary to acute GI blood loss. Hypotension due to acute GI bleeding. CHF Dementia. Nausea and vomiting. Failure to thrive. Dysphagia with aspiration pneumonia. Covid 19 pneumonia    Plan:  1.  I advised Amy that I would be available if they had further questions or concerns. 2.  GI will sign off for now.  Call me in the interim if there are any questions.  Kieley Akter K. Alice Reichert, M.D. 07/23/2022, 7:56 AM

## 2022-07-23 NOTE — Telephone Encounter (Signed)
Kyra from [facility] called stating the family want the provider to be the attending of records and provide a oral CTI

## 2022-07-23 NOTE — Progress Notes (Signed)
PT Cancellation Note  Patient Details Name: Todd Trujillo. MRN: [id number] DOB: [birth year]   Cancelled Treatment:    Reason Eval/Treat Not Completed: Other (comment). Chart reviewed. Upon entry with PT/OT, Pt's spouse at bedside reports plan to d/c home in hospice care. Plan is to keep pt comfortable. PT to sign off.    Salem Caster. Fairly IV, PT, DPT Physical Therapist- [facility]  07/23/2022, 9:33 AM

## 2022-07-23 NOTE — TOC Progression Note (Signed)
Transition of Care [facility]) - Progression Note    Patient Details  Name: Creig Landin. MRN: [id number] Date of Birth: 1929/12/06  Transition of Care [facility]) CM/SW [place], [place] Phone Number: 07/23/2022, 11:25 AM  Clinical Narrative:     CSW spoke with patient's daughter Amy, wishes for patient to go home with [facility] hospice. Treatment team informed of family wishes to dc home today . TOC assisted in facilitating dc home with hospice, ems forms on chart. No further needs. [facility] liaison has called [facility] for transport, RN and family aware.   Expected Discharge Plan:  (TBD) Barriers to Discharge: Continued Medical Work up  Expected Discharge Plan and Services         Expected Discharge Date: 07/23/22                                     Social Determinants of Health (SDOH) Interventions SDOH Screenings   Food Insecurity: No Food Insecurity (07/19/2022)  Housing: Low Risk  (06/08/2022)  Transportation Needs: No Transportation Needs (07/19/2022)  Recent Concern: Transportation Needs - Unmet Transportation Needs (06/08/2022)  Utilities: Not At Risk (07/19/2022)  Depression (PHQ2-9): Low Risk  (06/21/2022)  Tobacco Use: Low Risk  (07/17/2022)    Readmission Risk Interventions     No data to display

## 2022-07-23 NOTE — Discharge Summary (Signed)
Physician Discharge Summary   Patient: Todd Trujillo. MRN: [id number] DOB: 1930/01/29  Admit date:     07/17/2022  Discharge date: 07/23/22  Discharge Physician: Jennye Boroughs   PCP: Einar Pheasant, MD   Recommendations at discharge:   Follow-up with hospice team within 24 hours of discharge  Discharge Diagnoses: Principal Problem:   Acute respiratory disease due to COVID-19 virus Active Problems:   Acute respiratory failure with hypoxia (HCC)   Aspiration pneumonia (HCC)   Severe sepsis (HCC)   Dementia without behavioral disturbance (HCC)   HTN (hypertension)   History of CVA (cerebrovascular accident)   Nausea & vomiting   Protein-calorie malnutrition, severe   Acute GI bleeding   Acute blood loss anemia  Resolved Problems:   * No resolved hospital problems. [facility] Course:  Todd Trujillo. is a 89 y.o. male male with medical history significant for recent discharge from the hospital on 06/11/2022 for aspiration pneumonia, hypoxia and acute colitis; dementia, hypertension, hyperlipidemia, chronic diastolic CHF, stroke, GERD, dementia, skin cancer, prostate cancer, kidney stone, aspiration pneumonia, colitis, who presented to the hospital with cough, shortness of breath and vomiting.  Her daughter said that patient had been ill and vomiting since Thanksgiving and they attributed this to aspiration pneumonia.  He tested positive for COVID-19 infection a day prior to admission.    He was admitted to the hospital for severe sepsis secondary to NUUVO-53 pneumonia complicated by acute hypoxic respiratory failure.  He was treated with IV Unasyn because of recent aspiration, Paxlovid and IV dexamethasone.  He required oxygen therapy for acute hypoxic respiratory failure.  Hospital course was complicated by acute GI bleeding/melena, acute blood loss anemia and hypotension.  He was treated with IV Protonix, IV fluids and required 2 units of packed red  blood cells for blood transfusion.  Gastroenterologist was consulted but family declined any endoscopic workup.  Patient has dysphagia and poor oral intake.  He is at very high risk for aspiration.  However, family wanted him to have a diet despite the risk of aspiration.  Unfortunately, patient was not really eating much.  His family decided it was time for them to take him home with hospice care.  Patient will be discharged home today with hospice.  Discharge plan was discussed with his wife at the bedside and Amy, daughter, over the phone.   Assessment and Plan:  Severe sepsis secondary to COVID-19 pneumonia, recent aspiration pneumonia: Completed Paxlovid and IV Unasyn on 07/22/2022.      Acute hypoxic respiratory failure: Resolved.     Acute GI bleeding, Melena: Family declines further evaluation.  Aspirin will be discontinued at discharge.      Acute blood loss anemia: H&H improved after transfusion with 2 units of packed red blood cells.   Hypotension: Improved    Poor oral intake, dysphagia: Family said that patient is not really eating anything and at this point they want to transition to hospice care at home   Nausea and vomiting: Improved.     General weakness/debility   Other comorbidities include dementia, history of stroke                Consultants: Gastroenterologist Procedures performed: None  Disposition: Hospice care, home with hospice Diet recommendation:  Discharge Diet Orders (From admission, onward)     Start     Ordered   07/23/22 0000  DIET DYS 2       Question:  Fluid consistency:  Answer:  Thin   07/23/22 1018           Dysphagia type 2 thin Liquid DISCHARGE MEDICATION: Allergies as of 07/23/2022       Reactions   Codeine Nausea And Vomiting, Nausea Only   Other reaction(s): Vomiting        Medication List     STOP taking these medications    aspirin EC 81 MG tablet   cholecalciferol 25 MCG (1000 UNIT) tablet Commonly known  as: VITAMIN D3   feeding supplement (NEPRO CARB STEADY) Liqd       TAKE these medications    acetaminophen 500 MG tablet Commonly known as: TYLENOL Take 500 mg by mouth every 6 (six) hours as needed.   ciprofloxacin 0.3 % ophthalmic solution Commonly known as: CILOXAN PLACE 1 DROP INTO BOTH EYES EVERY 6 HOURS   pimecrolimus 1 % cream Commonly known as: Elidel Apply topically 2 (two) times daily.   QUEtiapine 50 MG tablet Commonly known as: SEROQUEL TAKE 1 TABLET BY MOUTH AT BEDTIME.        Discharge Exam: Filed Weights   07/17/22 5409 07/18/22 2153 07/19/22 1527  Weight: 63.5 kg 66.8 kg 63.9 kg   GEN: NAD SKIN: Warm and dry EYES: EOMI ENT: MMM CV: RRR PULM: coarse breath sounds ABD: soft, ND, NT, +BS CNS: AAO x 3, non focal EXT: No edema or tenderness   Condition at discharge: stable  The results of significant diagnostics from this hospitalization (including imaging, microbiology, ancillary and laboratory) are listed below for reference.   Imaging Studies: DG Chest Portable 1 View  Result Date: 07/17/2022 CLINICAL DATA:  Shortness of breath EXAM: PORTABLE CHEST 1 VIEW COMPARISON:  06/08/22 CXR FINDINGS: No pleural effusion. No pneumothorax. Normal cardiac and mediastinal contours. There is likely apical predominant emphysema. Compared to prior exam there is slight interval increase in prominence of interstitial opacities at the bilateral lung bases. No displaced rib fractures. Visualized upper abdomen is unremarkable. IMPRESSION: Slight interval increase in prominence of interstitial opacities at the bilateral lung bases, which could be seen in the setting of small airways disease/bronchitis. Electronically Signed   By: Marin Roberts M.D.   On: 07/17/2022 10:36    Microbiology: Results for orders placed or performed during the hospital encounter of 07/17/22  Culture, blood (Routine x 2)     Status: None   Collection Time: 07/17/22 10:12 AM   Specimen: BLOOD  LEFT ARM  Result Value Ref Range Status   Specimen Description BLOOD LEFT ARM  Final   Special Requests   Final    BOTTLES DRAWN AEROBIC AND ANAEROBIC Blood Culture adequate volume   Culture   Final    NO GROWTH 5 DAYS Performed at [facility], [address] [place], [place] [address]    Report Status 07/22/2022 FINAL  Final  Resp panel by RT-PCR (RSV, Flu A&B, Covid) Anterior Nasal Swab     Status: Abnormal   Collection Time: 07/17/22 10:12 AM   Specimen: Anterior Nasal Swab  Result Value Ref Range Status   SARS Coronavirus 2 by RT PCR POSITIVE (A) NEGATIVE Final    Comment: (NOTE) SARS-CoV-2 target nucleic acids are DETECTED.  The SARS-CoV-2 RNA is generally detectable in upper respiratory specimens during the acute phase of infection. Positive results are indicative of the presence of the identified virus, but do not rule out bacterial infection or co-infection with other pathogens not detected by the test. Clinical correlation with patient history and other diagnostic information  is necessary to determine patient infection status. The expected result is Negative.  Fact Sheet for Patients: EntrepreneurPulse.com.au  Fact Sheet for Healthcare Providers: IncredibleEmployment.be  This test is not yet approved or cleared by the [place] FDA and  has been authorized for detection and/or diagnosis of SARS-CoV-2 by FDA under an Emergency Use Authorization (EUA).  This EUA will remain in effect (meaning this test can be used) for the duration of  the COVID-19 declaration under Section 564(b)(1) of the A ct, 21 U.S.C. section 360bbb-3(b)(1), unless the authorization is terminated or revoked sooner.     Influenza A by PCR NEGATIVE NEGATIVE Final   Influenza B by PCR NEGATIVE NEGATIVE Final    Comment: (NOTE) The Xpert Xpress SARS-CoV-2/FLU/RSV plus assay is intended as an aid in the diagnosis of influenza from Nasopharyngeal  swab specimens and should not be used as a sole basis for treatment. Nasal washings and aspirates are unacceptable for Xpert Xpress SARS-CoV-2/FLU/RSV testing.  Fact Sheet for Patients: EntrepreneurPulse.com.au  Fact Sheet for Healthcare Providers: IncredibleEmployment.be  This test is not yet approved or cleared by the [place] FDA and has been authorized for detection and/or diagnosis of SARS-CoV-2 by FDA under an Emergency Use Authorization (EUA). This EUA will remain in effect (meaning this test can be used) for the duration of the COVID-19 declaration under Section 564(b)(1) of the Act, 21 U.S.C. section 360bbb-3(b)(1), unless the authorization is terminated or revoked.     Resp Syncytial Virus by PCR NEGATIVE NEGATIVE Final    Comment: (NOTE) Fact Sheet for Patients: EntrepreneurPulse.com.au  Fact Sheet for Healthcare Providers: IncredibleEmployment.be  This test is not yet approved or cleared by the [place] FDA and has been authorized for detection and/or diagnosis of SARS-CoV-2 by FDA under an Emergency Use Authorization (EUA). This EUA will remain in effect (meaning this test can be used) for the duration of the COVID-19 declaration under Section 564(b)(1) of the Act, 21 U.S.C. section 360bbb-3(b)(1), unless the authorization is terminated or revoked.  Performed at [facility], [address] [place], [place] [address]   Culture, blood (Routine x 2)     Status: None   Collection Time: 07/17/22 11:46 AM   Specimen: BLOOD  Result Value Ref Range Status   Specimen Description BLOOD LEFT ANTECUBITAL  Final   Special Requests   Final    BOTTLES DRAWN AEROBIC AND ANAEROBIC Blood Culture adequate volume   Culture   Final    NO GROWTH 5 DAYS Performed at [facility], [address] [place], [place] [address]    Report Status 07/22/2022 FINAL  Final     Labs: CBC: Recent Labs  Lab 07/19/22 0621 07/20/22 0710 07/21/22 0608 07/22/22 0119 07/22/22 0326 07/22/22 1609 07/23/22 0644  WBC 13.6* 18.2* 17.0* 18.1* 19.9*  --  17.3*  NEUTROABS 11.7* 15.8* 14.8*  --  16.7*  --  13.5*  HGB 9.6* 9.1* 8.7* 5.7* 5.8* 9.4* 8.8*  HCT 32.2* 30.8* 28.5* 19.0* 19.4* 28.4* 27.2*  MCV 93.6 92.5 90.8 92.7 91.9  --  88.9  PLT 324 325 326 341 313  --  829   Basic Metabolic Panel: Recent Labs  Lab 07/19/22 0621 07/20/22 0710 07/21/22 0608 07/22/22 0119 07/23/22 0644  NA 145 144 144 142 147*  K 4.0 3.7 3.9 4.3 3.8  CL 110 111 109 112* 115*  CO2 '27 28 26 23 25  '$ GLUCOSE 142* 161* 131* 170* 119*  BUN 19 26* 25* 53* 45*  CREATININE  0.70 0.76 0.71 1.11 0.92  CALCIUM 9.0 8.7* 8.7* 8.2* 8.3*  MG 2.1  --  2.1  --   --   PHOS 2.9  --  2.5  --   --    Liver Function Tests: Recent Labs  Lab 07/17/22 1012 07/19/22 0621 07/20/22 0710 07/22/22 0119  AST '22 18 18 18  '$ ALT '10 7 8 10  '$ ALKPHOS 95 71 62 42  BILITOT 0.8 0.8 0.7 0.7  PROT 7.8 6.3* 5.7* 4.4*  ALBUMIN 3.5 2.6* 2.5* 2.1*   CBG: No results for input(s): "GLUCAP" in the last 168 hours.  Discharge time spent: greater than 30 minutes.  Signed: Jennye Boroughs, MD [facility] 07/23/2022

## 2022-07-23 NOTE — Progress Notes (Signed)
OT Cancellation Note  Patient Details Name: Todd Trujillo. MRN: [id number] DOB: March 14, 1930   Cancelled Treatment:    Reason Eval/Treat Not Completed: Other (comment) (Pt now to d/c home with hospice services; OT to sign off.) OT discussed pt disposition with wife extensively. Education provided to wife about availability of therapy services to be provided by hospice agency at family's request if they feel that pt would benefit from therapy if he begins to improve his status. Otherwise, wife ready to take pt home with family support and hospice services at home. Pt awoke briefly during session and able to make verbal sound in acknowledgement of OT greeting. No further OT needs at this time. OT will sign off.   Vania Rea 07/23/2022, 9:37 AM

## 2022-07-23 NOTE — Care Management Important Message (Signed)
Important Message  Patient Details  Name: Todd Trujillo. MRN: [id number] Date of Birth: [birth year]   Medicare Important Message Given:  Other (see comment)  Disposition to discharge with hospice services.  Medicare IM withheld at this time out of respect for patient and family.    Dannette Barbara 07/23/2022, 9:13 AM

## 2022-07-23 NOTE — Progress Notes (Addendum)
[facility]) Hospital Liaison Note  Received request from Transitions of Care Manager, Caryl Pina, for hospice services at home after discharge. Chart and patient information under review by [facility] physician.   Spoke with daughter/Amy to initiate education related to hospice philosophy, services, and team approach to care. Amy verbalized understanding of information given. Per discussion, the plan is for patient to discharge home via [facility] once cleared to DC.    DME needs discussed. Patient has the following equipment in the home: Hospital bed [facility] lift Patient requests the following equipment for delivery: N/a, family denies any DME needs  Address verified and is correct in the chart.    Please send signed and completed DNR home with patient/family. Please provide prescriptions at discharge as needed to ensure ongoing symptom management.    [facility] information and contact numbers given to family & above information shared with TOC.   Please call with any questions/concerns.    Thank you for the opportunity to participate in this patient's care.   Daphene Calamity, MSW [facility] Liaison  [phone]

## 2022-07-24 ENCOUNTER — Inpatient Hospital Stay: Payer: Medicare Other

## 2022-07-24 ENCOUNTER — Telehealth: Payer: Self-pay | Admitting: Internal Medicine

## 2022-07-24 ENCOUNTER — Inpatient Hospital Stay: Payer: Medicare Other | Admitting: Internal Medicine

## 2022-07-24 LAB — TYPE AND SCREEN
ABO/RH(D): A POS
Antibody Screen: NEGATIVE
Unit division: 0
Unit division: 0

## 2022-07-24 LAB — BLOOD GAS, VENOUS
Acid-Base Excess: 3.5 mmol/L — ABNORMAL HIGH (ref 0.0–2.0)
Bicarbonate: 27.8 mmol/L (ref 20.0–28.0)
O2 Saturation: 33 %
Patient temperature: 37
pCO2, Ven: 40 mmHg — ABNORMAL LOW (ref 44–60)
pH, Ven: 7.45 — ABNORMAL HIGH (ref 7.25–7.43)

## 2022-07-24 LAB — BPAM RBC
Blood Product Expiration Date: 202402032359
Blood Product Expiration Date: 202402032359
ISSUE DATE / TIME: 202401070538
ISSUE DATE / TIME: 202401071012
Unit Type and Rh: 6200
Unit Type and Rh: 6200

## 2022-07-24 NOTE — Telephone Encounter (Signed)
LM for Todd Trujillo to cb

## 2022-07-24 NOTE — Telephone Encounter (Signed)
Amy from [facility] called stating she would like to talk to the provider or cma regarding the pt and providing medication for him

## 2022-07-24 NOTE — Telephone Encounter (Signed)
Amy Ronne Binning called back. Would like to speak with Marissa concerning Mr. Aldon.

## 2022-07-30 ENCOUNTER — Telehealth: Payer: Self-pay | Admitting: Internal Medicine

## 2022-08-01 ENCOUNTER — Telehealth: Payer: Self-pay | Admitting: Internal Medicine

## 2022-08-01 NOTE — Telephone Encounter (Signed)
Dee from [facility] need the provider to sign a death certificate for pt Case [phone]

## 2022-08-02 NOTE — Telephone Encounter (Signed)
Electronic death certificate signed.

## 2022-08-16 NOTE — Telephone Encounter (Signed)
Amy from [facility] called. Patient passed away today, August 15, 2022 at 8:42am.

## 2022-08-16 NOTE — Telephone Encounter (Signed)
Called and left message -  Orion Crook (daughter)

## 2022-08-16 DEATH — deceased

## 2022-08-20 ENCOUNTER — Ambulatory Visit: Payer: Medicare Other | Admitting: Internal Medicine

## 2022-08-30 ENCOUNTER — Encounter: Payer: Self-pay | Admitting: Internal Medicine

## 2022-09-06 ENCOUNTER — Ambulatory Visit: Payer: Medicare Other | Admitting: Dermatology
